# Patient Record
Sex: Female | Born: 1945 | Race: White | Hispanic: No | Marital: Married | Smoking: Former smoker
Health system: Southern US, Community
[De-identification: ages and names within clinical notes are randomized; demographics above are authoritative.]

## PROBLEM LIST (undated history)

## (undated) DIAGNOSIS — I10 Essential (primary) hypertension: Secondary | ICD-10-CM

## (undated) DIAGNOSIS — F419 Anxiety disorder, unspecified: Secondary | ICD-10-CM

## (undated) DIAGNOSIS — I639 Cerebral infarction, unspecified: Secondary | ICD-10-CM

## (undated) DIAGNOSIS — R413 Other amnesia: Secondary | ICD-10-CM

## (undated) DIAGNOSIS — K279 Peptic ulcer, site unspecified, unspecified as acute or chronic, without hemorrhage or perforation: Secondary | ICD-10-CM

## (undated) DIAGNOSIS — M1990 Unspecified osteoarthritis, unspecified site: Secondary | ICD-10-CM

## (undated) DIAGNOSIS — E785 Hyperlipidemia, unspecified: Secondary | ICD-10-CM

## (undated) DIAGNOSIS — K589 Irritable bowel syndrome without diarrhea: Secondary | ICD-10-CM

## (undated) DIAGNOSIS — I6523 Occlusion and stenosis of bilateral carotid arteries: Secondary | ICD-10-CM

## (undated) DIAGNOSIS — D649 Anemia, unspecified: Secondary | ICD-10-CM

## (undated) DIAGNOSIS — G459 Transient cerebral ischemic attack, unspecified: Secondary | ICD-10-CM

## (undated) DIAGNOSIS — E119 Type 2 diabetes mellitus without complications: Secondary | ICD-10-CM

## (undated) HISTORY — PX: OOPHORECTOMY: SHX86

## (undated) HISTORY — DX: Hyperlipidemia, unspecified: E78.5

## (undated) HISTORY — PX: ABDOMINAL HYSTERECTOMY: SHX81

## (undated) HISTORY — DX: Irritable bowel syndrome, unspecified: K58.9

## (undated) HISTORY — PX: COLON SURGERY: SHX602

## (undated) HISTORY — DX: Type 2 diabetes mellitus without complications: E11.9

## (undated) HISTORY — PX: TONSILLECTOMY: SHX5217

## (undated) HISTORY — DX: Peptic ulcer, site unspecified, unspecified as acute or chronic, without hemorrhage or perforation: K27.9

## (undated) HISTORY — PX: OTHER SURGICAL HISTORY: SHX169

---
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
## 2000-11-12 ENCOUNTER — Encounter: Payer: Self-pay | Admitting: Internal Medicine

## 2000-11-12 ENCOUNTER — Encounter: Admission: RE | Admit: 2000-11-12 | Discharge: 2000-11-12 | Payer: Self-pay | Admitting: Internal Medicine

## 2002-08-26 ENCOUNTER — Encounter: Payer: Self-pay | Admitting: Internal Medicine

## 2002-08-26 ENCOUNTER — Encounter: Admission: RE | Admit: 2002-08-26 | Discharge: 2002-08-26 | Payer: Self-pay | Admitting: Internal Medicine

## 2003-06-21 ENCOUNTER — Encounter: Admission: RE | Admit: 2003-06-21 | Discharge: 2003-06-21 | Payer: Self-pay | Admitting: Internal Medicine

## 2003-06-29 ENCOUNTER — Ambulatory Visit (HOSPITAL_COMMUNITY): Admission: RE | Admit: 2003-06-29 | Discharge: 2003-06-29 | Payer: Self-pay | Admitting: Internal Medicine

## 2004-01-29 ENCOUNTER — Emergency Department (HOSPITAL_COMMUNITY): Admission: EM | Admit: 2004-01-29 | Discharge: 2004-01-29 | Payer: Self-pay | Admitting: Emergency Medicine

## 2004-04-02 DIAGNOSIS — G459 Transient cerebral ischemic attack, unspecified: Secondary | ICD-10-CM

## 2004-04-02 HISTORY — DX: Transient cerebral ischemic attack, unspecified: G45.9

## 2004-04-12 ENCOUNTER — Ambulatory Visit: Payer: Self-pay | Admitting: Internal Medicine

## 2004-04-12 ENCOUNTER — Encounter: Admission: RE | Admit: 2004-04-12 | Discharge: 2004-04-12 | Payer: Self-pay | Admitting: Internal Medicine

## 2004-04-16 ENCOUNTER — Encounter: Admission: RE | Admit: 2004-04-16 | Discharge: 2004-04-16 | Payer: Self-pay | Admitting: Internal Medicine

## 2004-04-25 ENCOUNTER — Ambulatory Visit: Payer: Self-pay

## 2004-04-26 ENCOUNTER — Ambulatory Visit: Payer: Self-pay | Admitting: Internal Medicine

## 2004-04-28 ENCOUNTER — Encounter: Admission: RE | Admit: 2004-04-28 | Discharge: 2004-04-28 | Payer: Self-pay | Admitting: Internal Medicine

## 2004-05-29 ENCOUNTER — Ambulatory Visit: Payer: Self-pay | Admitting: Internal Medicine

## 2004-06-01 ENCOUNTER — Ambulatory Visit: Payer: Self-pay | Admitting: Internal Medicine

## 2004-07-04 ENCOUNTER — Ambulatory Visit: Payer: Self-pay | Admitting: Internal Medicine

## 2005-02-05 ENCOUNTER — Ambulatory Visit: Payer: Self-pay | Admitting: Internal Medicine

## 2005-05-15 ENCOUNTER — Ambulatory Visit: Payer: Self-pay

## 2005-06-14 ENCOUNTER — Ambulatory Visit: Payer: Self-pay | Admitting: Internal Medicine

## 2005-06-19 ENCOUNTER — Encounter: Admission: RE | Admit: 2005-06-19 | Discharge: 2005-06-19 | Payer: Self-pay | Admitting: Internal Medicine

## 2005-11-12 ENCOUNTER — Ambulatory Visit: Payer: Self-pay | Admitting: Internal Medicine

## 2005-12-20 ENCOUNTER — Ambulatory Visit: Payer: Self-pay | Admitting: Internal Medicine

## 2007-01-29 ENCOUNTER — Encounter: Payer: Self-pay | Admitting: Internal Medicine

## 2007-01-29 ENCOUNTER — Ambulatory Visit: Payer: Self-pay | Admitting: Internal Medicine

## 2007-01-29 LAB — CONVERTED CEMR LAB
ALT: 29 units/L (ref 0–35)
AST: 26 units/L (ref 0–37)
BUN: 21 mg/dL (ref 6–23)
CO2: 32 meq/L (ref 19–32)
Calcium: 9.2 mg/dL (ref 8.4–10.5)
Chloride: 102 meq/L (ref 96–112)
Creatinine, Ser: 0.9 mg/dL (ref 0.4–1.2)
HDL: 47 mg/dL (ref >39.0)
Hgb A1c MFr Bld: 7.2 % — ABNORMAL HIGH (ref 4.6–6.0)
Potassium: 4.2 meq/L (ref 3.5–5.1)
VLDL: 52 mg/dL — ABNORMAL HIGH (ref 0–40)

## 2007-01-30 DIAGNOSIS — Z8711 Personal history of peptic ulcer disease: Secondary | ICD-10-CM | POA: Insufficient documentation

## 2007-01-30 DIAGNOSIS — I10 Essential (primary) hypertension: Secondary | ICD-10-CM

## 2007-01-30 DIAGNOSIS — L0292 Furuncle, unspecified: Secondary | ICD-10-CM | POA: Insufficient documentation

## 2007-01-30 DIAGNOSIS — E118 Type 2 diabetes mellitus with unspecified complications: Secondary | ICD-10-CM

## 2007-01-30 DIAGNOSIS — E785 Hyperlipidemia, unspecified: Secondary | ICD-10-CM | POA: Insufficient documentation

## 2007-01-30 DIAGNOSIS — E119 Type 2 diabetes mellitus without complications: Secondary | ICD-10-CM | POA: Insufficient documentation

## 2007-01-30 DIAGNOSIS — L0293 Carbuncle, unspecified: Secondary | ICD-10-CM

## 2007-01-30 DIAGNOSIS — Z8719 Personal history of other diseases of the digestive system: Secondary | ICD-10-CM | POA: Insufficient documentation

## 2007-03-07 ENCOUNTER — Ambulatory Visit: Payer: Self-pay | Admitting: Internal Medicine

## 2007-03-07 DIAGNOSIS — A0472 Enterocolitis due to Clostridium difficile, not specified as recurrent: Secondary | ICD-10-CM | POA: Insufficient documentation

## 2007-03-10 ENCOUNTER — Encounter: Payer: Self-pay | Admitting: Internal Medicine

## 2007-03-11 ENCOUNTER — Telehealth: Payer: Self-pay | Admitting: Internal Medicine

## 2007-05-07 ENCOUNTER — Telehealth (INDEPENDENT_AMBULATORY_CARE_PROVIDER_SITE_OTHER): Payer: Self-pay | Admitting: *Deleted

## 2007-05-08 ENCOUNTER — Ambulatory Visit: Payer: Self-pay | Admitting: Internal Medicine

## 2007-05-08 DIAGNOSIS — M79604 Pain in right leg: Secondary | ICD-10-CM

## 2007-09-30 ENCOUNTER — Encounter: Admission: RE | Admit: 2007-09-30 | Discharge: 2007-09-30 | Payer: Self-pay | Admitting: Internal Medicine

## 2008-03-08 ENCOUNTER — Ambulatory Visit: Payer: Self-pay | Admitting: Internal Medicine

## 2008-03-08 DIAGNOSIS — I6350 Cerebral infarction due to unspecified occlusion or stenosis of unspecified cerebral artery: Secondary | ICD-10-CM | POA: Insufficient documentation

## 2008-03-08 DIAGNOSIS — E663 Overweight: Secondary | ICD-10-CM | POA: Insufficient documentation

## 2008-03-18 ENCOUNTER — Encounter: Payer: Self-pay | Admitting: Internal Medicine

## 2008-03-25 ENCOUNTER — Ambulatory Visit: Payer: Self-pay | Admitting: Internal Medicine

## 2008-03-25 LAB — CONVERTED CEMR LAB
Albumin: 3.8 g/dL (ref 3.5–5.2)
BUN: 23 mg/dL (ref 6–23)
Calcium: 9.7 mg/dL (ref 8.4–10.5)
Cholesterol: 207 mg/dL (ref 0–200)
Direct LDL: 135.9 mg/dL
Eosinophils Absolute: 0.2 10*3/uL (ref 0.0–0.7)
Eosinophils Relative: 2.3 % (ref 0.0–5.0)
GFR calc Af Amer: 65 mL/min
GFR calc non Af Amer: 54 mL/min
Glucose, Bld: 226 mg/dL — ABNORMAL HIGH (ref 70–99)
HCT: 36.1 % (ref 36.0–46.0)
Hemoglobin: 12.5 g/dL (ref 12.0–15.0)
MCV: 94.4 fL (ref 78.0–100.0)
Monocytes Absolute: 0.6 10*3/uL (ref 0.1–1.0)
Monocytes Relative: 7.2 % (ref 3.0–12.0)
Neutro Abs: 4.5 10*3/uL (ref 1.4–7.7)
RDW: 12.1 % (ref 11.5–14.6)
Total CHOL/HDL Ratio: 4.2
Total Protein: 6 g/dL (ref 6.0–8.3)
Triglycerides: 198 mg/dL — ABNORMAL HIGH (ref 0–149)

## 2008-04-27 ENCOUNTER — Ambulatory Visit: Payer: Self-pay | Admitting: Internal Medicine

## 2008-04-29 ENCOUNTER — Telehealth: Payer: Self-pay | Admitting: Internal Medicine

## 2008-07-14 ENCOUNTER — Ambulatory Visit: Payer: Self-pay | Admitting: Internal Medicine

## 2008-07-14 LAB — CONVERTED CEMR LAB
Cholesterol: 202 mg/dL — ABNORMAL HIGH (ref 0–200)
HDL: 48.6 mg/dL (ref >39.00)
Triglycerides: 238 mg/dL — ABNORMAL HIGH (ref 0.0–149.0)

## 2008-07-25 ENCOUNTER — Encounter: Payer: Self-pay | Admitting: Internal Medicine

## 2008-12-09 ENCOUNTER — Telehealth: Payer: Self-pay | Admitting: Internal Medicine

## 2008-12-10 ENCOUNTER — Telehealth: Payer: Self-pay | Admitting: Family Medicine

## 2008-12-10 ENCOUNTER — Ambulatory Visit: Payer: Self-pay | Admitting: Internal Medicine

## 2008-12-10 ENCOUNTER — Encounter: Admission: RE | Admit: 2008-12-10 | Discharge: 2008-12-10 | Payer: Self-pay | Admitting: Internal Medicine

## 2008-12-10 DIAGNOSIS — M549 Dorsalgia, unspecified: Secondary | ICD-10-CM | POA: Insufficient documentation

## 2008-12-13 ENCOUNTER — Telehealth: Payer: Self-pay | Admitting: Internal Medicine

## 2008-12-16 ENCOUNTER — Encounter: Payer: Self-pay | Admitting: Internal Medicine

## 2009-02-08 ENCOUNTER — Ambulatory Visit: Payer: Self-pay | Admitting: Internal Medicine

## 2009-02-08 DIAGNOSIS — R209 Unspecified disturbances of skin sensation: Secondary | ICD-10-CM | POA: Insufficient documentation

## 2009-02-08 LAB — CONVERTED CEMR LAB
Folate: >20 ng/mL
Hgb A1c MFr Bld: 6.9 % — ABNORMAL HIGH (ref 4.6–6.5)
TSH: 1.76 microintl units/mL (ref 0.35–5.50)

## 2009-02-25 ENCOUNTER — Encounter: Admission: RE | Admit: 2009-02-25 | Discharge: 2009-02-25 | Payer: Self-pay | Admitting: Internal Medicine

## 2009-02-25 LAB — HM MAMMOGRAPHY

## 2009-03-04 ENCOUNTER — Telehealth: Payer: Self-pay | Admitting: Internal Medicine

## 2009-09-15 ENCOUNTER — Ambulatory Visit: Payer: Self-pay | Admitting: Internal Medicine

## 2009-09-15 DIAGNOSIS — L538 Other specified erythematous conditions: Secondary | ICD-10-CM

## 2009-09-15 DIAGNOSIS — N952 Postmenopausal atrophic vaginitis: Secondary | ICD-10-CM

## 2009-09-15 DIAGNOSIS — M25559 Pain in unspecified hip: Secondary | ICD-10-CM

## 2009-09-15 LAB — CONVERTED CEMR LAB
ALT: 24 units/L (ref 0–35)
Albumin: 4.3 g/dL (ref 3.5–5.2)
BUN: 17 mg/dL (ref 6–23)
Bilirubin, Direct: 0.1 mg/dL (ref 0.0–0.3)
Cholesterol: 226 mg/dL — ABNORMAL HIGH (ref 0–200)
GFR calc non Af Amer: 70.95 mL/min (ref >60)
HDL: 57.6 mg/dL (ref >39.00)
Hgb A1c MFr Bld: 6.8 % — ABNORMAL HIGH (ref 4.6–6.5)
Potassium: 4.6 meq/L (ref 3.5–5.1)
Sodium: 146 meq/L — ABNORMAL HIGH (ref 135–145)
Total Protein: 6.8 g/dL (ref 6.0–8.3)
Triglycerides: 342 mg/dL — ABNORMAL HIGH (ref 0.0–149.0)

## 2009-09-16 ENCOUNTER — Encounter: Payer: Self-pay | Admitting: Internal Medicine

## 2009-10-20 ENCOUNTER — Telehealth: Payer: Self-pay | Admitting: Internal Medicine

## 2009-12-07 ENCOUNTER — Telehealth (INDEPENDENT_AMBULATORY_CARE_PROVIDER_SITE_OTHER): Payer: Self-pay | Admitting: *Deleted

## 2009-12-08 ENCOUNTER — Ambulatory Visit: Payer: Self-pay | Admitting: Internal Medicine

## 2009-12-08 DIAGNOSIS — R002 Palpitations: Secondary | ICD-10-CM

## 2009-12-13 ENCOUNTER — Telehealth (INDEPENDENT_AMBULATORY_CARE_PROVIDER_SITE_OTHER): Payer: Self-pay | Admitting: *Deleted

## 2009-12-22 ENCOUNTER — Ambulatory Visit: Payer: Self-pay

## 2009-12-27 ENCOUNTER — Encounter: Payer: Self-pay | Admitting: Internal Medicine

## 2009-12-27 ENCOUNTER — Encounter (INDEPENDENT_AMBULATORY_CARE_PROVIDER_SITE_OTHER): Payer: Self-pay | Admitting: *Deleted

## 2009-12-30 ENCOUNTER — Telehealth (INDEPENDENT_AMBULATORY_CARE_PROVIDER_SITE_OTHER): Payer: Self-pay | Admitting: *Deleted

## 2010-01-13 ENCOUNTER — Ambulatory Visit: Payer: Self-pay | Admitting: Internal Medicine

## 2010-01-30 ENCOUNTER — Encounter: Payer: Self-pay | Admitting: Internal Medicine

## 2010-02-11 ENCOUNTER — Encounter: Payer: Self-pay | Admitting: Internal Medicine

## 2010-04-06 ENCOUNTER — Ambulatory Visit
Admission: RE | Admit: 2010-04-06 | Discharge: 2010-04-06 | Payer: Self-pay | Source: Home / Self Care | Attending: Internal Medicine | Admitting: Internal Medicine

## 2010-04-06 ENCOUNTER — Encounter: Payer: Self-pay | Admitting: Internal Medicine

## 2010-04-06 DIAGNOSIS — B351 Tinea unguium: Secondary | ICD-10-CM | POA: Insufficient documentation

## 2010-04-23 ENCOUNTER — Encounter: Payer: Self-pay | Admitting: Internal Medicine

## 2010-05-02 NOTE — Progress Notes (Signed)
  Phone Note Refill Request Message from:  Fax from Pharmacy on October 20, 2009 8:20 AM  Refills Requested: Medication #1:  ULTRAM 50 MG TABS 1-2  by mouth every 6 h as needed Please Advise refill.  Initial call taken by: Ami Bullins CMA,  October 20, 2009 8:21 AM  Follow-up for Phone Call        OK for refill as needed  Follow-up by: Jacques Navy MD,  October 20, 2009 8:52 AM    Prescriptions: Tiffany Wiggins 50 MG TABS (TRAMADOL HCL) 1-2  by mouth every 6 h as needed  #60 x 1   Entered by:   Ami Bullins CMA   Authorized by:   Jacques Navy MD   Signed by:   Bill Salinas CMA on 10/20/2009   Method used:   Electronically to        [facility].* (retail)       [address]       [place], [place]  [address]       Ph: [phone]       Fax: [phone]   RxID:   [id number]

## 2010-05-02 NOTE — Progress Notes (Signed)
Summary: DISCUSS PALPITATION OV--  ---- Converted from flag ---- ---- 12/06/2009 6:18 PM, Jacques Navy MD wrote: patient needs an ov to discuss palpitations ------------------------------ Gave pt appt/phone:  12/08/09 @ 250P w/Dr Debby Bud
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
## 2010-05-02 NOTE — Progress Notes (Signed)
Summary: event monitor 01/13/10  Phone Note Outgoing Call Call back at [facility] Phone [phone]   Call placed by: Stanton Kidney, EMT-P,  December 30, 2009 2:02 PM Call placed to: Patient Action Taken: Phone Call Completed Summary of Call: S/W Patient, scheduled for event monitor for [facility] 01/13/10 2:00

## 2010-05-02 NOTE — Procedures (Signed)
Summary: Summary Report  Summary Report   Imported By: Erle Crocker 02/16/2010 13:56:47  _____________________________________________________________________  External Attachment:    Type:   Image     Comment:   External Document

## 2010-05-02 NOTE — Miscellaneous (Signed)
Summary: Orders Update  Clinical Lists Changes  Orders: Added new Referral order of Cardiology Referral (Cardiology) - Signed 
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
## 2010-05-02 NOTE — Procedures (Signed)
Summary: [facility]  [employer]   Imported By: Sherian Rein 02/21/2010 07:37:33  _____________________________________________________________________  External Attachment:    Type:   Image     Comment:   External Document

## 2010-05-02 NOTE — Progress Notes (Signed)
Summary: holter monitior  Phone Note Outgoing Call Call back at Home Phone [phone]   Call placed by: Marcos Eke,  December 13, 2009 4:51 PM Action Taken: Appt scheduled Summary of Call: Pt has appt. for holter monitor on 12/22/09 at 9am

## 2010-05-02 NOTE — Procedures (Signed)
Summary: summary report  summary report   Imported By: Mirna Mires 12/27/2009 16:15:43  _____________________________________________________________________  External Attachment:    Type:   Image     Comment:   External Document

## 2010-05-02 NOTE — Letter (Signed)
[facility] [address] [place], [place]  [address] Phone: [phone]      September 16, 2009   [facility] [address] [place], [place] [address]  RE:  LAB RESULTS  Dear  [name],  The following is an interpretation of your most recent lab tests.  Please take note of any instructions provided or changes to medications that have resulted from your lab work.  ELECTROLYTES:  Good - no changes needed  KIDNEY FUNCTION TESTS:  Good - no changes needed  LIVER FUNCTION TESTS:  Good - no changes needed  Health professionals look at cholesterol as more involved than just the total cholesterol. We consider the level of LDL (bad) cholesterol, HDL (good), cholesterol, and Triglycerides (Grease) in the blood.  1. Your LDL should be under 100, and the HDL should be over 45, if you have any vascular disease such as heart attack, angina, stroke, TIA (mini stroke), claudication (pain in the legs when you walk due to poor circulation),  Abdominal Aortic Aneurysm (AAA), diabetes or prediabetes.  2. Your LDL should be under 130 if you have any two of the following:     a. Smoke or chew tobacco,     b. High blood pressure (if you are on medication or over 140/90 without medication),     c. Female gender,    d. HDL below 40,    e. A female relative (father, brother, or son), who have had any vascular event          as described in #1. above under the age of 71, or a female relative (mother,       sister, or daughter) who had an event as described above under age 96. (An HDL over 60 will subtract one risk factor from the total, so if you have two items in # 2 above, but an HDL over 60, you then fall into category # 3 below).  3. Your LDL should be under 160 if you have any one of the above.  Triglycerides should be under 200 with the ideal being under 150.  For diabetes or pre-diabetes, the ideal HgbA1C should be under 6.0%.  If you fall into any of the above categories, you  should make a follow up appointment to discuss this with your physician.  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 water exercise would be great.  Call or e-mail me if you have questions ([email or web address]).    Sincerely Yours,    Jacques Navy MD  Patient: Tiffany Wiggins Note: All result statuses are Final unless otherwise noted.  Tests: (1) BMP (METABOL)   Sodium               [H]  146 mEq/L                   135-145   Potassium                 4.6 mEq/L  3.5-5.1   Chloride                  108 mEq/L                   96-112   Carbon Dioxide            30 mEq/L                    19-32   Glucose              [H]  139 mg/dL                   16-10   BUN                       17 mg/dL                    9-60   Creatinine                0.9 mg/dL                   4.5-4.0   Calcium                   10.0 mg/dL                  9.8-11.9   GFR                       70.95 mL/min                >60  Tests: (2) Hemoglobin A1C (A1C)   Hemoglobin A1C       [H]  6.8 %                       4.6-6.5     Glycemic Control Guidelines for People with Diabetes:     Non Diabetic:  <6%     Goal of Therapy: <7%     Additional Action Suggested:  >8%   Tests: (3) Lipid Panel (LIPID)   Cholesterol          [H]  226 mg/dL                   1-478     ATP III Classification            Desirable:  < 200 mg/dL                    Borderline High:  200 - 239 mg/dL               High:  > = 240 mg/dL   Triglycerides        [H]  342.0 mg/dL                 2.9-562.1      Normal:  <150 mg/dL     Borderline High:  308 - 199 mg/dL   HDL                       65.78 mg/dL                 >46.96   VLDL Cholesterol     [H]  68.4 mg/dL  0.0-40.0  CHO/HDL Ratio:  CHD Risk                             4                    Men          Women     1/2 Average Risk     3.4          3.3     Average Risk          5.0          4.4     2X Average Risk          9.6          7.1     3X Average Risk          15.0          11.0                           Tests: (4) Hepatic/Liver Function Panel (HEPATIC)   Total Bilirubin           0.9 mg/dL                   1.6-1.0   Direct Bilirubin          0.1 mg/dL                   9.6-0.4   Alkaline Phosphatase      71 U/L                      39-117   AST                       24 U/L                      0-37   ALT                       24 U/L                      0-35   Total Protein             6.8 g/dL                    5.4-0.9   Albumin                   4.3 g/dL                    8.1-1.9  Tests: (5) Cholesterol LDL - Direct (DIRLDL)  Cholesterol LDL - Direct                             125.9 mg/dL     Optimal:  <147 mg/dL     Near or Above Optimal:  100-129 mg/dL     Borderline High:  829-562 mg/dL     High:  130-865 mg/dL     Very High:  >784 mg/dL  G HIP BILATERAL W/PELVIS - [id number]   Clinical Data: Bilateral hip pain.  Worsening pain with weightbearing.   BILATERAL HIP WITH PELVIS - 4+ VIEW   Comparison:  None.   Findings: Degenerative osteophytic formation bilaterally.  This involves the acetabuli and femoral heads.  No joint space narrowing.  Normal alignment.   IMPRESSION: Degenerative OA bilaterally.   Read By:  Jonne Ply,  Judie Petit.D.
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
## 2010-08-02 ENCOUNTER — Telehealth: Payer: Self-pay | Admitting: *Deleted

## 2010-08-02 DIAGNOSIS — M549 Dorsalgia, unspecified: Secondary | ICD-10-CM

## 2010-08-02 MED ORDER — PREDNISONE 10 MG PO TABS: 10.0000 mg | ORAL_TABLET | Freq: Every day | ORAL | Status: DC

## 2010-08-02 MED ORDER — OXYCODONE-ACETAMINOPHEN 7.5-500 MG PO TABS: 1.0000 | ORAL_TABLET | Freq: Four times a day (QID) | ORAL | Status: DC | PRN

## 2010-08-02 NOTE — Telephone Encounter (Signed)
Pt is having trouble with a herniated disk, she is bed bound this morning and has taken 2 50mg  tramadol with little relief. I gave pts husband the option of coming in at 11:30 today. But he states the pt cannot get out of the bed. Pt is coming in for an office visit tomorrow but would like advisement as to what she can do to control her pain until she can get here for an office visit. Please Advise

## 2010-08-02 NOTE — Telephone Encounter (Signed)
Called pts husband fred and informed him of Dr Debby Bud advisement. Pts husband will come pick up percocet prescription.

## 2010-08-02 NOTE — Telephone Encounter (Signed)
For severe  Back pain that may be disk related: prednisone 10mg : 40mg  x 1, then 30mg  x 3, 20mg  x 3 10 mg x6; percocet 7.5/500 q 6 # 40. She will need L-S spine films prior to being seen tomorrow. Order entered.

## 2010-08-03 ENCOUNTER — Ambulatory Visit (INDEPENDENT_AMBULATORY_CARE_PROVIDER_SITE_OTHER)
Admission: RE | Admit: 2010-08-03 | Discharge: 2010-08-03 | Disposition: A | Discharge status: Home / Self Care | Payer: Self-pay | Source: Ambulatory Visit | Attending: Internal Medicine | Admitting: Internal Medicine

## 2010-08-03 ENCOUNTER — Ambulatory Visit (INDEPENDENT_AMBULATORY_CARE_PROVIDER_SITE_OTHER): Payer: 59 | Admitting: Internal Medicine

## 2010-08-03 VITALS — BP 136/80 | HR 65 | Temp 97.2°F

## 2010-08-03 DIAGNOSIS — M549 Dorsalgia, unspecified: Secondary | ICD-10-CM

## 2010-08-03 DIAGNOSIS — M545 Low back pain: Secondary | ICD-10-CM

## 2010-08-03 MED ORDER — CYCLOBENZAPRINE HCL 5 MG PO TABS: 5.0000 mg | ORAL_TABLET | Freq: Three times a day (TID) | ORAL | Status: AC | PRN

## 2010-08-03 NOTE — Patient Instructions (Signed)
Low back pain right - exam does not reveal any radicular symptoms, i.e. Herniated disk with nerve root compression. Most likely the pain is muscle strain with muscle spasm. Plan - continue prednisone burst and taper, continue with tramadol, start flexeril 5 mg 3 times a day, use the narcotic is needed. Use your arms to move from sitting to laying down and vice versa. If you get increased weakness, loss of feeling or pins and needles, change in bladder or bowel control, increased and severe pain - CALL.  Back Pain (Lumbosacral Strain) Back pain is one of the most common causes of pain. There are many causes of back pain. Most are not serious conditions.  CAUSES Your backbone (spinal column) is made up of 24 main vertebral bodies, the sacrum, and the coccyx. These are held together by muscles and tough, fibrous tissue (ligaments). Nerve roots pass through the openings between the vertebrae. A sudden move or injury to the back may cause injury to, or pressure on, these nerves. This may result in localized back pain or pain movement (radiation) into the buttocks, down the leg, and into the foot. Sharp, shooting pain from the buttock down the back of the leg (sciatica) is frequently associated with a ruptured (herniated) disc. Pain may be caused by muscle spasm alone. Your caregiver can often find the cause of your pain by the details of your symptoms and an exam. In some cases, you may need tests (such as X-rays). Your caregiver will work with you to decide if any tests are needed based on your specific exam. HOME CARE INSTRUCTIONS  Avoid an underactive lifestyle. Active exercise, as directed by your caregiver, is your greatest weapon against back pain.   Avoid hard physical activities (tennis, racquetball, water-skiing) if you are not in proper physical condition for it. This may aggravate and/or create problems.   If you have a back problem, avoid sports requiring sudden body movements. Swimming and  walking are generally safer activities.   Maintain good posture.   Avoid becoming overweight (obese).   Use bed rest for only the most extreme, sudden (acute) episode. Your caregiver will help you determine how much bed rest is necessary.   For acute conditions, you may put ice on the injured area.   Put ice in a plastic bag.   Place a towel between your skin and the bag.   Leave the ice on for 10 minutes at a time, every 2 hours, or as needed.   After you are improved and more active, it may help to apply heat for 30 minutes before activities.  See your caregiver if you are having pain that lasts longer than expected. Your caregiver can advise appropriate exercises and/or therapy if needed. With conditioning, most back problems can be avoided. SEEK IMMEDIATE MEDICAL CARE IF:  You have numbness, tingling, weakness, or problems with the use of your arms or legs.   You experience severe back pain not relieved with medicines.   There is a change in bowel or bladder control.   You have increasing pain in any area of the body, including your belly (abdomen).   You notice shortness of breath, dizziness, or feel faint.   You feel sick to your stomach (nauseous), are throwing up (vomiting), or become sweaty.   You notice discoloration of your toes or legs, or your feet get very cold.   Your back pain is getting worse.   You have an oral temperature above 100, not controlled by medicine.  MAKE SURE YOU:    Understand these instructions.   Will watch your condition.   Will get help right away if you are not doing well or get worse.  Document Released: 12/27/2004 Document Re-Released: 06/13/2009 [facility] Patient Information 2011 [place], [place].

## 2010-08-06 NOTE — Progress Notes (Signed)
  Subjective:    Patient ID: Tiffany Wiggins, female    DOB: 1945/06/22, 65 y.o.   MRN: [id number]  HPI [name], a 65 y/o white woman presents with a 3 week h/o low back pain that began after working in the garden pulling weeds. The pain is similar to what she experienced in the past with a HNP. She has tried bedrest, ibuprofen, tramadol and icing her back all of which have helped relieve her pain. Due to prolonged discomfort she called the office May 2 and was prescribed prednisone burst and taper along with percocet. She started the steroids but not the narcotics. She denies any loss of bowel or bladder control, no isolated motor weakness, no loss of sensation.   PMH, FamHx and SocHx reviewed for any changes and relevance.    Review of Systems Review of Systems  Constitutional:  Negative for fever, chills, activity change and unexpected weight change.  HENT:  Negative for hearing loss, ear pain, congestion, neck stiffness and postnasal drip.   Eyes: Negative for pain, discharge and visual disturbance.  Respiratory: Negative for chest tightness and wheezing.   Cardiovascular: Negative for chest pain and palpitations.       [No decreased exercise tolerance Gastrointestinal: [No change in bowel habit. No bloating or gas. No reflux or indigestion Genitourinary: Negative for urgency, frequency, flank pain and difficulty urinating.  Musculoskeletal: per the HPI  Neurological: Negative for dizziness, tremors, weakness and headaches.  Hematological: Negative for adenopathy.  Psychiatric/Behavioral: Negative for behavioral problems and dysphoric mood.       Objective:   Physical Exam WNWD white woman in no distress HEENT- C&S clear Chest - no increased WOB Cor- RRR Back exam: able to stand without assist, flex to 60 degrees, gait - slow but no foot drop, able to heel/toe walk, able to step up to exam table, nl SLR sitting and supine, nl DTRs patellar tendon, nl sensation to light touch  and pin-prick, nl deep vibratory sensation.         Assessment & Plan:  1. Low back pain - no radiculopathy on exam. Suspect muscle strain and not nerve root impingement.  Plan - continue prednisone taper           Flexeril 5mg  tid for muscle spasm           Continue tramadol and may use percocet if needed.           Resume home exercises for her back when pain subsides           Call for increased, refractory pain, paresthesia, motor weakness, incontinence.

## 2010-08-08 ENCOUNTER — Other Ambulatory Visit: Payer: Self-pay | Admitting: Internal Medicine

## 2010-08-08 DIAGNOSIS — M549 Dorsalgia, unspecified: Secondary | ICD-10-CM

## 2010-08-09 ENCOUNTER — Ambulatory Visit (HOSPITAL_COMMUNITY)
Admission: RE | Admit: 2010-08-09 | Discharge: 2010-08-09 | Disposition: A | Discharge status: Home / Self Care | Payer: 59 | Source: Ambulatory Visit | Attending: Internal Medicine | Admitting: Internal Medicine

## 2010-08-09 ENCOUNTER — Other Ambulatory Visit: Payer: Self-pay | Admitting: *Deleted

## 2010-08-09 DIAGNOSIS — M48061 Spinal stenosis, lumbar region without neurogenic claudication: Secondary | ICD-10-CM | POA: Insufficient documentation

## 2010-08-09 DIAGNOSIS — M545 Low back pain, unspecified: Secondary | ICD-10-CM | POA: Insufficient documentation

## 2010-08-09 DIAGNOSIS — M549 Dorsalgia, unspecified: Secondary | ICD-10-CM

## 2010-08-09 DIAGNOSIS — M5126 Other intervertebral disc displacement, lumbar region: Secondary | ICD-10-CM | POA: Insufficient documentation

## 2010-08-09 MED ORDER — COLESEVELAM HCL 625 MG PO TABS: 1875.0000 mg | ORAL_TABLET | Freq: Two times a day (BID) | ORAL | Status: DC

## 2010-08-11 ENCOUNTER — Telehealth: Payer: Self-pay | Admitting: Internal Medicine

## 2010-08-11 DIAGNOSIS — M5416 Radiculopathy, lumbar region: Secondary | ICD-10-CM

## 2010-08-11 NOTE — Telephone Encounter (Signed)
Patient stated she does not have a neurosurgeon and asks to be referred to one. Please advise.

## 2010-08-11 NOTE — Telephone Encounter (Signed)
Called patient - will refer to [facility] for [facility] Order entered. Please follow-up with PCCs Monday to get this moving. Thanks

## 2010-08-11 NOTE — Telephone Encounter (Signed)
Please advise on referral and things that patient can do or take until that appointment is scheduled. Thanks

## 2010-08-11 NOTE — Telephone Encounter (Signed)
Patient's husband called inquiring about the results of her MRI done on 08/09/10. Patient's husband states she is in severe pain and requests a call back with results and further advise.

## 2010-08-11 NOTE — Telephone Encounter (Signed)
Note printed and given to [facility]

## 2010-08-11 NOTE — Telephone Encounter (Signed)
Primary change is increased disk bulge at L3-4 with L3 nerve root encroachement. Steroids have failed. Need to call her neurosurgeon for acute appontment.

## 2010-08-15 ENCOUNTER — Other Ambulatory Visit: Payer: Self-pay | Admitting: Internal Medicine

## 2010-08-15 DIAGNOSIS — M549 Dorsalgia, unspecified: Secondary | ICD-10-CM

## 2010-08-21 ENCOUNTER — Ambulatory Visit
Admission: RE | Admit: 2010-08-21 | Discharge: 2010-08-21 | Disposition: A | Discharge status: Home / Self Care | Payer: 59 | Source: Ambulatory Visit | Attending: Internal Medicine | Admitting: Internal Medicine

## 2010-08-21 DIAGNOSIS — M549 Dorsalgia, unspecified: Secondary | ICD-10-CM

## 2010-08-23 ENCOUNTER — Telehealth: Payer: Self-pay | Admitting: Internal Medicine

## 2010-08-23 NOTE — Telephone Encounter (Signed)
Please call patient: Did the epidural steroid injection help??

## 2010-08-24 NOTE — Telephone Encounter (Signed)
Pt states injection helped tremendously. She states spasms are gone and she hasn't had any since the injection. Pt states Yesterday morning when she got up she had urinary incontinence. She states symptoms started two days ago. Could this be from injection?

## 2010-09-01 ENCOUNTER — Encounter: Payer: Self-pay | Admitting: Internal Medicine

## 2010-09-04 ENCOUNTER — Encounter: Payer: Self-pay | Admitting: Internal Medicine

## 2010-09-04 ENCOUNTER — Ambulatory Visit (INDEPENDENT_AMBULATORY_CARE_PROVIDER_SITE_OTHER): Payer: 59 | Admitting: Internal Medicine

## 2010-09-04 DIAGNOSIS — M79609 Pain in unspecified limb: Secondary | ICD-10-CM

## 2010-09-04 NOTE — Progress Notes (Signed)
  Subjective:    Patient ID: Tiffany Wiggins, female    DOB: 05/26/1945, 65 y.o.   MRN: [id number]  HPI Tiffany Wiggins is seen in follow-up. She had severe back pain with MRI that revealed disk disease with nerve root impingement. She did have ESI with excellent results. She is still having a very little soreness in the low back and asks about using Ibuprofen and heat - OK. Did discuss smart lifting and work.   PMH, FamHx and SocHx reviewed for any changes and relevance.  Review of Systems Review of Systems  Constitutional:  Negative for fever, chills, activity change and unexpected weight change.  HENT:  Negative for hearing loss, ear pain, congestion, neck stiffness and postnasal drip.   Eyes: Negative for pain, discharge and visual disturbance.  Respiratory: Negative for chest tightness and wheezing.   Cardiovascular: Negative for chest pain and palpitations.       [No decreased exercise tolerance Gastrointestinal: [No change in bowel habit. No bloating or gas. No reflux or indigestion Genitourinary: Negative for urgency, frequency, flank pain and difficulty urinating.  Musculoskeletal: Negative for myalgias, arthralgias and gait problem. Has minimal resiudal back pain. Neurological: Negative for dizziness, tremors, weakness and headaches.  Hematological: Negative for adenopathy.  Psychiatric/Behavioral: Negative for behavioral problems and dysphoric mood.       Objective:   Physical Exam Vitals noted Gen'l-WNWD white woman who is in no distress Pul- no increased WOB Cor- RRR MSK- nl gait and station, able to stand without assistance.        Assessment & Plan:

## 2010-09-07 NOTE — Assessment & Plan Note (Signed)
Recent bout of back pain with radiation to the leg due to bulging disk by MRI. Has done very well with ESI.  Plan - resume normal activity            Good back care with regular stretching and smart lifting.           She is aware of potential for recurrent problems and will call for any recurrent pain.

## 2010-10-06 ENCOUNTER — Other Ambulatory Visit: Payer: Self-pay | Admitting: Internal Medicine

## 2010-11-07 ENCOUNTER — Telehealth: Payer: Self-pay | Admitting: *Deleted

## 2010-11-07 DIAGNOSIS — M79609 Pain in unspecified limb: Secondary | ICD-10-CM

## 2010-11-07 NOTE — Telephone Encounter (Signed)
Husband called back - Patient prefers OV with Dr Debby Bud, she will keep OV this Thursday.

## 2010-11-07 NOTE — Telephone Encounter (Signed)
Husband left vm - there was some e-mail correspondence? Option was another OV w/Dr Norins or another injection? Pt would like injection only set up for 8/13. Is this ESI? Pt does have OV scheduled with you on 8/9 - should she keep this, please help clarify, THANKS

## 2010-11-07 NOTE — Telephone Encounter (Signed)
Will need referral- [facility] notified. Keep appt

## 2010-11-09 ENCOUNTER — Ambulatory Visit: Payer: 59 | Admitting: Internal Medicine

## 2010-11-13 ENCOUNTER — Other Ambulatory Visit: Payer: Self-pay | Admitting: Internal Medicine

## 2010-11-13 DIAGNOSIS — M5416 Radiculopathy, lumbar region: Secondary | ICD-10-CM

## 2010-11-17 ENCOUNTER — Ambulatory Visit
Admission: RE | Admit: 2010-11-17 | Discharge: 2010-11-17 | Disposition: A | Discharge status: Home / Self Care | Payer: 59 | Source: Ambulatory Visit | Attending: Internal Medicine | Admitting: Internal Medicine

## 2010-11-17 DIAGNOSIS — M5416 Radiculopathy, lumbar region: Secondary | ICD-10-CM

## 2010-11-17 MED ORDER — IOHEXOL 180 MG/ML  SOLN
1.0000 mL | Freq: Once | INTRAMUSCULAR | Status: AC | PRN
  Administered 2010-11-17: 1 mL via EPIDURAL

## 2010-11-17 MED ORDER — METHYLPREDNISOLONE ACETATE 40 MG/ML INJ SUSP (RADIOLOG
120.0000 mg | Freq: Once | INTRAMUSCULAR | Status: AC
  Administered 2010-11-17: 120 mg via EPIDURAL

## 2010-12-12 ENCOUNTER — Other Ambulatory Visit: Payer: Self-pay | Admitting: Internal Medicine

## 2010-12-12 DIAGNOSIS — M549 Dorsalgia, unspecified: Secondary | ICD-10-CM

## 2010-12-12 DIAGNOSIS — R209 Unspecified disturbances of skin sensation: Secondary | ICD-10-CM

## 2011-01-09 ENCOUNTER — Other Ambulatory Visit: Payer: Self-pay | Admitting: Internal Medicine

## 2011-01-17 ENCOUNTER — Other Ambulatory Visit: Payer: Self-pay | Admitting: Internal Medicine

## 2011-01-17 DIAGNOSIS — Z1231 Encounter for screening mammogram for malignant neoplasm of breast: Secondary | ICD-10-CM

## 2011-01-23 ENCOUNTER — Ambulatory Visit
Admission: RE | Admit: 2011-01-23 | Discharge: 2011-01-23 | Disposition: A | Discharge status: Home / Self Care | Payer: 59 | Source: Ambulatory Visit | Attending: Internal Medicine | Admitting: Internal Medicine

## 2011-01-23 DIAGNOSIS — Z1231 Encounter for screening mammogram for malignant neoplasm of breast: Secondary | ICD-10-CM

## 2011-02-09 ENCOUNTER — Other Ambulatory Visit: Payer: Self-pay | Admitting: Internal Medicine

## 2011-03-02 ENCOUNTER — Other Ambulatory Visit: Payer: Self-pay | Admitting: Internal Medicine

## 2011-04-13 ENCOUNTER — Other Ambulatory Visit: Payer: Self-pay | Admitting: Internal Medicine

## 2011-04-19 ENCOUNTER — Encounter (HOSPITAL_COMMUNITY): Payer: Self-pay | Admitting: Pharmacy Technician

## 2011-04-19 ENCOUNTER — Other Ambulatory Visit: Payer: Self-pay | Admitting: Neurosurgery

## 2011-04-23 ENCOUNTER — Encounter (HOSPITAL_COMMUNITY)
Admission: RE | Admit: 2011-04-23 | Discharge: 2011-04-23 | Disposition: A | Discharge status: Home / Self Care | Payer: 59 | Source: Ambulatory Visit | Attending: Neurosurgery | Admitting: Neurosurgery

## 2011-04-23 ENCOUNTER — Other Ambulatory Visit: Payer: Self-pay

## 2011-04-23 ENCOUNTER — Encounter (HOSPITAL_COMMUNITY): Payer: Self-pay

## 2011-04-23 HISTORY — DX: Cerebral infarction, unspecified: I63.9

## 2011-04-23 HISTORY — DX: Essential (primary) hypertension: I10

## 2011-04-23 LAB — COMPREHENSIVE METABOLIC PANEL
ALT: 19 U/L (ref 0–35)
AST: 19 U/L (ref 0–37)
Albumin: 3.8 g/dL (ref 3.5–5.2)
Alkaline Phosphatase: 65 U/L (ref 39–117)
Chloride: 106 mEq/L (ref 96–112)
Potassium: 4.4 mEq/L (ref 3.5–5.1)
Sodium: 145 mEq/L (ref 135–145)
Total Protein: 6.7 g/dL (ref 6.0–8.3)

## 2011-04-23 LAB — CBC
HCT: 38.8 % (ref 36.0–46.0)
MCH: 31.5 pg (ref 26.0–34.0)
MCHC: 32.7 g/dL (ref 30.0–36.0)
MCV: 96.3 fL (ref 78.0–100.0)
RDW: 12.4 % (ref 11.5–15.5)
WBC: 10 10*3/uL (ref 4.0–10.5)

## 2011-04-23 NOTE — Progress Notes (Addendum)
2006.Marland Kitchen..Marland KitchenGETTING OUT OF SHOWER, HEAD WAS SPINNING, NAUSEA, RUNNING INTO PEOPLE, DROPPING THINGS...MRA  DONE....WAS PLACED ON PLAVIX  AND TAKES EVERY  SINCE....Marland KitchenHAD TIA  SPOKE WITH ALLISON REGARDING THE 2 VIEW CXR.......SINCE PT HAS NO REAL ISSUES WITH BP (TAKES LASIX), BUT DID HAVE IN 2006 A TIA AND HAS BEEN TAKING PLAVIX.Marland KitchenMarland KitchenTHE DECISION TO DO PCXR  DOS  WAS AGREED UPON....Marland KitchenREQUEST IN ORDERS MGMT

## 2011-04-23 NOTE — Pre-Procedure Instructions (Signed)
[address] Tiffany Wiggins  04/23/2011   Your procedure is scheduled on:  Wednesday, January 23 RD  Report to [facility] at  6:30 AM.  Call this number if you have problems the morning of surgery: [phone]   Remember:   Do not eat food:After Midnight TUESDAY.  May have clear liquids: up to 4 Hours before arrival 2:30 AM.  Clear liquids include soda, tea, black coffee, apple or grape juice, broth.   Take these medicines the morning of surgery with A SIP OF WATER: OMEPRAZOLE              AND TRAMADOL.   Do not wear jewelry, make-up or nail polish.   Do not wear lotions, powders, or perfumes. You may wear deodorant.  Do not shave 48 hours prior to surgery.  Do not bring valuables to the hospital.   Contacts, dentures or bridgework may not be worn into surgery.  Leave suitcase in the car. After surgery it may be brought to your room.  For patients admitted to the hospital, checkout time is 11:00 AM the day of discharge.   Patients discharged the day of surgery will not be allowed to drive home.  Name and phone number of your driver:  FRED  [place]  --  SPOUSE             [phone]  Special Instructions: CHG Shower Use Special Wash: 1/2 bottle night before surgery and 1/2 bottle morning of surgery.   Please read over the following fact sheets that you were given: Pain Booklet, MRSA Information and Surgical Site Infection Prevention

## 2011-04-24 MED ORDER — CEFAZOLIN SODIUM-DEXTROSE 2-3 GM-% IV SOLR
2.0000 g | INTRAVENOUS | Status: AC
  Administered 2011-04-25: 2 g via INTRAVENOUS

## 2011-04-24 MED ORDER — CEFAZOLIN SODIUM 1-5 GM-% IV SOLN: 1.0000 g | INTRAVENOUS | Status: DC

## 2011-04-25 ENCOUNTER — Ambulatory Visit (HOSPITAL_COMMUNITY): Payer: 59

## 2011-04-25 ENCOUNTER — Ambulatory Visit (HOSPITAL_COMMUNITY)
Admission: RE | Admit: 2011-04-25 | Discharge: 2011-04-26 | DRG: 491 | Disposition: A | Discharge status: Home / Self Care | Payer: 59 | Source: Ambulatory Visit | Attending: Neurosurgery | Admitting: Neurosurgery

## 2011-04-25 ENCOUNTER — Encounter (HOSPITAL_COMMUNITY): Payer: Self-pay | Admitting: Anesthesiology

## 2011-04-25 ENCOUNTER — Encounter (HOSPITAL_COMMUNITY): Payer: Self-pay | Admitting: *Deleted

## 2011-04-25 ENCOUNTER — Ambulatory Visit (HOSPITAL_COMMUNITY): Payer: 59 | Admitting: Anesthesiology

## 2011-04-25 ENCOUNTER — Encounter (HOSPITAL_COMMUNITY)
Admission: RE | Disposition: A | Discharge status: Home / Self Care | Payer: Self-pay | Source: Ambulatory Visit | Attending: Neurosurgery

## 2011-04-25 DIAGNOSIS — Z860100 Personal history of colon polyps, unspecified: Secondary | ICD-10-CM

## 2011-04-25 DIAGNOSIS — M5137 Other intervertebral disc degeneration, lumbosacral region: Secondary | ICD-10-CM | POA: Diagnosis present

## 2011-04-25 DIAGNOSIS — M25559 Pain in unspecified hip: Secondary | ICD-10-CM

## 2011-04-25 DIAGNOSIS — Z8711 Personal history of peptic ulcer disease: Secondary | ICD-10-CM

## 2011-04-25 DIAGNOSIS — M5126 Other intervertebral disc displacement, lumbar region: Secondary | ICD-10-CM | POA: Diagnosis present

## 2011-04-25 DIAGNOSIS — Z01812 Encounter for preprocedural laboratory examination: Secondary | ICD-10-CM

## 2011-04-25 DIAGNOSIS — L538 Other specified erythematous conditions: Secondary | ICD-10-CM

## 2011-04-25 DIAGNOSIS — M79609 Pain in unspecified limb: Secondary | ICD-10-CM

## 2011-04-25 DIAGNOSIS — R209 Unspecified disturbances of skin sensation: Secondary | ICD-10-CM

## 2011-04-25 DIAGNOSIS — E785 Hyperlipidemia, unspecified: Secondary | ICD-10-CM

## 2011-04-25 DIAGNOSIS — M51379 Other intervertebral disc degeneration, lumbosacral region without mention of lumbar back pain or lower extremity pain: Secondary | ICD-10-CM | POA: Diagnosis present

## 2011-04-25 DIAGNOSIS — A0472 Enterocolitis due to Clostridium difficile, not specified as recurrent: Secondary | ICD-10-CM

## 2011-04-25 DIAGNOSIS — Z8719 Personal history of other diseases of the digestive system: Secondary | ICD-10-CM

## 2011-04-25 DIAGNOSIS — K589 Irritable bowel syndrome without diarrhea: Secondary | ICD-10-CM | POA: Diagnosis present

## 2011-04-25 DIAGNOSIS — Z79899 Other long term (current) drug therapy: Secondary | ICD-10-CM

## 2011-04-25 DIAGNOSIS — R7309 Other abnormal glucose: Secondary | ICD-10-CM

## 2011-04-25 DIAGNOSIS — B351 Tinea unguium: Secondary | ICD-10-CM

## 2011-04-25 DIAGNOSIS — R002 Palpitations: Secondary | ICD-10-CM

## 2011-04-25 DIAGNOSIS — I6350 Cerebral infarction due to unspecified occlusion or stenosis of unspecified cerebral artery: Secondary | ICD-10-CM

## 2011-04-25 DIAGNOSIS — E663 Overweight: Secondary | ICD-10-CM

## 2011-04-25 DIAGNOSIS — Z01818 Encounter for other preprocedural examination: Secondary | ICD-10-CM

## 2011-04-25 DIAGNOSIS — N952 Postmenopausal atrophic vaginitis: Secondary | ICD-10-CM

## 2011-04-25 DIAGNOSIS — Z8673 Personal history of transient ischemic attack (TIA), and cerebral infarction without residual deficits: Secondary | ICD-10-CM

## 2011-04-25 DIAGNOSIS — L0293 Carbuncle, unspecified: Secondary | ICD-10-CM

## 2011-04-25 DIAGNOSIS — I10 Essential (primary) hypertension: Secondary | ICD-10-CM

## 2011-04-25 DIAGNOSIS — L0292 Furuncle, unspecified: Secondary | ICD-10-CM

## 2011-04-25 DIAGNOSIS — M549 Dorsalgia, unspecified: Secondary | ICD-10-CM

## 2011-04-25 HISTORY — PX: LUMBAR LAMINECTOMY/DECOMPRESSION MICRODISCECTOMY: SHX5026

## 2011-04-25 SURGERY — LUMBAR LAMINECTOMY/DECOMPRESSION MICRODISCECTOMY
Anesthesia: General | Site: Back | Laterality: Right | Wound class: Clean

## 2011-04-25 MED ORDER — PROMETHAZINE HCL 25 MG/ML IJ SOLN: 6.2500 mg | INTRAMUSCULAR | Status: DC | PRN

## 2011-04-25 MED ORDER — FENTANYL CITRATE 0.05 MG/ML IJ SOLN
INTRAMUSCULAR | Status: DC | PRN
  Administered 2011-04-25 (×4): 50 ug via INTRAVENOUS

## 2011-04-25 MED ORDER — FENTANYL CITRATE 0.05 MG/ML IJ SOLN
INTRAMUSCULAR | Status: AC
  Filled 2011-04-25: qty 2

## 2011-04-25 MED ORDER — GLYCOPYRROLATE 0.2 MG/ML IJ SOLN
INTRAMUSCULAR | Status: DC | PRN
  Administered 2011-04-25: 0.2 mg via INTRAVENOUS
  Administered 2011-04-25: .6 mg via INTRAVENOUS

## 2011-04-25 MED ORDER — SODIUM CHLORIDE 0.9 % IJ SOLN: 3.0000 mL | INTRAMUSCULAR | Status: DC | PRN

## 2011-04-25 MED ORDER — HEMOSTATIC AGENTS (NO CHARGE) OPTIME
TOPICAL | Status: DC | PRN
  Administered 2011-04-25: 1 via TOPICAL

## 2011-04-25 MED ORDER — MENTHOL 3 MG MT LOZG: 1.0000 | LOZENGE | OROMUCOSAL | Status: DC | PRN

## 2011-04-25 MED ORDER — ROSUVASTATIN CALCIUM 20 MG PO TABS
20.0000 mg | ORAL_TABLET | Freq: Every day | ORAL | Status: DC
  Administered 2011-04-25: 20 mg via ORAL
  Filled 2011-04-25 (×2): qty 1

## 2011-04-25 MED ORDER — PHENYLEPHRINE HCL 10 MG/ML IJ SOLN
10.0000 mg | INTRAVENOUS | Status: DC | PRN
  Administered 2011-04-25: 10 ug/min via INTRAVENOUS

## 2011-04-25 MED ORDER — ACETAMINOPHEN 325 MG PO TABS: 650.0000 mg | ORAL_TABLET | ORAL | Status: DC | PRN

## 2011-04-25 MED ORDER — NEOSTIGMINE METHYLSULFATE 1 MG/ML IJ SOLN
INTRAMUSCULAR | Status: DC | PRN
  Administered 2011-04-25: 4 mg via INTRAVENOUS

## 2011-04-25 MED ORDER — THROMBIN 5000 UNITS EX SOLR
CUTANEOUS | Status: DC | PRN
  Administered 2011-04-25: 5000 [IU] via TOPICAL

## 2011-04-25 MED ORDER — MIDAZOLAM HCL 5 MG/5ML IJ SOLN
INTRAMUSCULAR | Status: DC | PRN
  Administered 2011-04-25: 2 mg via INTRAVENOUS

## 2011-04-25 MED ORDER — BUPIVACAINE LIPOSOME 1.3 % IJ SUSP
INTRAMUSCULAR | Status: DC | PRN
  Administered 2011-04-25: 20 mL

## 2011-04-25 MED ORDER — CEFAZOLIN SODIUM 1-5 GM-% IV SOLN
1.0000 g | Freq: Three times a day (TID) | INTRAVENOUS | Status: AC
  Administered 2011-04-25 – 2011-04-26 (×2): 1 g via INTRAVENOUS
  Filled 2011-04-25 (×2): qty 50

## 2011-04-25 MED ORDER — METHYLPREDNISOLONE ACETATE 80 MG/ML IJ SUSP
INTRAMUSCULAR | Status: DC | PRN
  Administered 2011-04-25: 80 mg

## 2011-04-25 MED ORDER — FENTANYL CITRATE 0.05 MG/ML IJ SOLN
INTRAMUSCULAR | Status: DC | PRN
  Administered 2011-04-25: 100 ug via INTRAVENOUS

## 2011-04-25 MED ORDER — HYDROMORPHONE HCL PF 1 MG/ML IJ SOLN
0.2500 mg | INTRAMUSCULAR | Status: DC | PRN
  Administered 2011-04-25 (×4): 0.5 mg via INTRAVENOUS

## 2011-04-25 MED ORDER — LACTATED RINGERS IV SOLN: INTRAVENOUS | Status: DC

## 2011-04-25 MED ORDER — ACETAMINOPHEN 650 MG RE SUPP: 650.0000 mg | RECTAL | Status: DC | PRN

## 2011-04-25 MED ORDER — HYDROMORPHONE HCL PF 1 MG/ML IJ SOLN
INTRAMUSCULAR | Status: AC
  Administered 2011-04-25: 0.5 mg via INTRAVENOUS
  Filled 2011-04-25: qty 1

## 2011-04-25 MED ORDER — ROCURONIUM BROMIDE 100 MG/10ML IV SOLN
INTRAVENOUS | Status: DC | PRN
  Administered 2011-04-25: 50 mg via INTRAVENOUS

## 2011-04-25 MED ORDER — PROPOFOL 10 MG/ML IV EMUL
INTRAVENOUS | Status: DC | PRN
  Administered 2011-04-25: 140 mg via INTRAVENOUS

## 2011-04-25 MED ORDER — ZOLPIDEM TARTRATE 5 MG PO TABS: 10.0000 mg | ORAL_TABLET | Freq: Every evening | ORAL | Status: DC | PRN

## 2011-04-25 MED ORDER — DOCUSATE SODIUM 100 MG PO CAPS
100.0000 mg | ORAL_CAPSULE | Freq: Two times a day (BID) | ORAL | Status: DC
  Administered 2011-04-25: 100 mg via ORAL
  Filled 2011-04-25: qty 1

## 2011-04-25 MED ORDER — ONDANSETRON HCL 4 MG/2ML IJ SOLN
INTRAMUSCULAR | Status: DC | PRN
  Administered 2011-04-25: 4 mg via INTRAVENOUS

## 2011-04-25 MED ORDER — MEPERIDINE HCL 25 MG/ML IJ SOLN: 6.2500 mg | INTRAMUSCULAR | Status: DC | PRN

## 2011-04-25 MED ORDER — PHENOL 1.4 % MT LIQD: 1.0000 | OROMUCOSAL | Status: DC | PRN

## 2011-04-25 MED ORDER — DEXAMETHASONE SODIUM PHOSPHATE 4 MG/ML IJ SOLN
INTRAMUSCULAR | Status: DC | PRN
  Administered 2011-04-25: 4 mg via INTRAVENOUS

## 2011-04-25 MED ORDER — ONDANSETRON HCL 4 MG/2ML IJ SOLN: 4.0000 mg | INTRAMUSCULAR | Status: DC | PRN

## 2011-04-25 MED ORDER — EPHEDRINE SULFATE 50 MG/ML IJ SOLN
INTRAMUSCULAR | Status: DC | PRN
  Administered 2011-04-25 (×4): 5 mg via INTRAVENOUS

## 2011-04-25 MED ORDER — SODIUM CHLORIDE 0.9 % IV SOLN
INTRAVENOUS | Status: DC
  Administered 2011-04-25: 14:00:00 via INTRAVENOUS

## 2011-04-25 MED ORDER — SODIUM CHLORIDE 0.9 % IJ SOLN
3.0000 mL | Freq: Two times a day (BID) | INTRAMUSCULAR | Status: DC
  Administered 2011-04-25 (×2): 3 mL via INTRAVENOUS

## 2011-04-25 MED ORDER — PANTOPRAZOLE SODIUM 40 MG PO TBEC
40.0000 mg | DELAYED_RELEASE_TABLET | Freq: Every day | ORAL | Status: DC
  Administered 2011-04-25: 40 mg via ORAL
  Filled 2011-04-25: qty 1

## 2011-04-25 MED ORDER — CEFAZOLIN SODIUM-DEXTROSE 2-3 GM-% IV SOLR
INTRAVENOUS | Status: AC
  Filled 2011-04-25: qty 50

## 2011-04-25 MED ORDER — OXYCODONE-ACETAMINOPHEN 5-325 MG PO TABS
1.0000 | ORAL_TABLET | ORAL | Status: DC | PRN
  Administered 2011-04-25: 1 via ORAL
  Administered 2011-04-25 – 2011-04-26 (×3): 2 via ORAL
  Filled 2011-04-25 (×3): qty 2

## 2011-04-25 MED ORDER — BUPIVACAINE LIPOSOME 1.3 % IJ SUSP
1.0000 | INTRAMUSCULAR | Status: AC
  Filled 2011-04-25: qty 20

## 2011-04-25 MED ORDER — 0.9 % SODIUM CHLORIDE (POUR BTL) OPTIME
TOPICAL | Status: DC | PRN
  Administered 2011-04-25: 1000 mL

## 2011-04-25 MED ORDER — COLESEVELAM HCL 625 MG PO TABS
1875.0000 mg | ORAL_TABLET | Freq: Two times a day (BID) | ORAL | Status: DC
  Administered 2011-04-25 – 2011-04-26 (×2): 1875 mg via ORAL
  Filled 2011-04-25 (×4): qty 3

## 2011-04-25 MED ORDER — OXYCODONE-ACETAMINOPHEN 5-325 MG PO TABS
ORAL_TABLET | ORAL | Status: AC
  Filled 2011-04-25: qty 2

## 2011-04-25 MED ORDER — FUROSEMIDE 40 MG PO TABS
40.0000 mg | ORAL_TABLET | Freq: Every day | ORAL | Status: DC
  Administered 2011-04-25: 40 mg via ORAL
  Filled 2011-04-25 (×2): qty 1

## 2011-04-25 MED ORDER — MORPHINE SULFATE 4 MG/ML IJ SOLN
4.0000 mg | INTRAMUSCULAR | Status: DC | PRN
  Administered 2011-04-25: 4 mg via INTRAVENOUS
  Filled 2011-04-25: qty 1

## 2011-04-25 MED ORDER — LACTATED RINGERS IV SOLN
INTRAVENOUS | Status: DC | PRN
  Administered 2011-04-25 (×2): via INTRAVENOUS

## 2011-04-25 SURGICAL SUPPLY — 67 items
ADH SKN CLS APL DERMABOND .7 (GAUZE/BANDAGES/DRESSINGS) ×2
APL SKNCLS STERI-STRIP NONHPOA (GAUZE/BANDAGES/DRESSINGS) ×1
BENZOIN TINCTURE PRP APPL 2/3 (GAUZE/BANDAGES/DRESSINGS) ×2 IMPLANT
BLADE SURG ROTATE 9660 (MISCELLANEOUS) IMPLANT
BUR ACORN 6.0 (BURR) ×1 IMPLANT
BUR MATCHSTICK NEURO 3.0 LAGG (BURR) ×2 IMPLANT
CANISTER SUCTION 2500CC (MISCELLANEOUS) ×2 IMPLANT
CLOSURE STERI STRIP 1/2 X4 (GAUZE/BANDAGES/DRESSINGS) ×2 IMPLANT
CLOTH BEACON ORANGE TIMEOUT ST (SAFETY) ×2 IMPLANT
CONT SPEC 4OZ CLIKSEAL STRL BL (MISCELLANEOUS) ×2 IMPLANT
CORDS BIPOLAR (ELECTRODE) ×2 IMPLANT
DERMABOND ADVANCED (GAUZE/BANDAGES/DRESSINGS) ×2
DERMABOND ADVANCED .7 DNX12 (GAUZE/BANDAGES/DRESSINGS) IMPLANT
DRAPE LAPAROTOMY 100X72X124 (DRAPES) ×2 IMPLANT
DRAPE MICROSCOPE LEICA (MISCELLANEOUS) ×2 IMPLANT
DRAPE POUCH INSTRU U-SHP 10X18 (DRAPES) ×2 IMPLANT
DRSG PAD ABDOMINAL 8X10 ST (GAUZE/BANDAGES/DRESSINGS) IMPLANT
DURAPREP 26ML APPLICATOR (WOUND CARE) ×2 IMPLANT
ELECT BLADE 4.0 EZ CLEAN MEGAD (MISCELLANEOUS) ×2
ELECT REM PT RETURN 9FT ADLT (ELECTROSURGICAL) ×2
ELECTRODE BLDE 4.0 EZ CLN MEGD (MISCELLANEOUS) IMPLANT
ELECTRODE REM PT RTRN 9FT ADLT (ELECTROSURGICAL) ×1 IMPLANT
GAUZE SPONGE 4X4 16PLY XRAY LF (GAUZE/BANDAGES/DRESSINGS) IMPLANT
GLOVE BIOGEL M 8.0 STRL (GLOVE) ×2 IMPLANT
GLOVE BIOGEL PI IND STRL 8 (GLOVE) ×1 IMPLANT
GLOVE BIOGEL PI IND STRL 8.5 (GLOVE) IMPLANT
GLOVE BIOGEL PI INDICATOR 8 (GLOVE) ×1
GLOVE BIOGEL PI INDICATOR 8.5 (GLOVE) ×1
GLOVE ECLIPSE 7.5 STRL STRAW (GLOVE) ×1 IMPLANT
GLOVE EXAM NITRILE LRG STRL (GLOVE) IMPLANT
GLOVE EXAM NITRILE MD LF STRL (GLOVE) IMPLANT
GLOVE EXAM NITRILE XL STR (GLOVE) IMPLANT
GLOVE EXAM NITRILE XS STR PU (GLOVE) IMPLANT
GLOVE SURG SS PI 8.0 STRL IVOR (GLOVE) ×4 IMPLANT
GOWN BRE IMP SLV AUR LG STRL (GOWN DISPOSABLE) ×2 IMPLANT
GOWN BRE IMP SLV AUR XL STRL (GOWN DISPOSABLE) ×1 IMPLANT
GOWN STRL REIN 2XL LVL4 (GOWN DISPOSABLE) ×2 IMPLANT
KIT BASIN OR (CUSTOM PROCEDURE TRAY) ×2 IMPLANT
KIT ROOM TURNOVER OR (KITS) ×2 IMPLANT
NDL HYPO 18GX1.5 BLUNT FILL (NEEDLE) IMPLANT
NDL HYPO 21X1.5 SAFETY (NEEDLE) IMPLANT
NDL HYPO 25X1 1.5 SAFETY (NEEDLE) IMPLANT
NDL SPNL 20GX3.5 QUINCKE YW (NEEDLE) IMPLANT
NEEDLE HYPO 18GX1.5 BLUNT FILL (NEEDLE) ×2 IMPLANT
NEEDLE HYPO 21X1.5 SAFETY (NEEDLE) ×2 IMPLANT
NEEDLE HYPO 25X1 1.5 SAFETY (NEEDLE) ×2 IMPLANT
NEEDLE SPNL 20GX3.5 QUINCKE YW (NEEDLE) ×2 IMPLANT
NS IRRIG 1000ML POUR BTL (IV SOLUTION) ×2 IMPLANT
PACK LAMINECTOMY NEURO (CUSTOM PROCEDURE TRAY) ×2 IMPLANT
PAD ARMBOARD 7.5X6 YLW CONV (MISCELLANEOUS) ×6 IMPLANT
PATTIES SURGICAL .5 X1 (DISPOSABLE) ×1 IMPLANT
RUBBERBAND STERILE (MISCELLANEOUS) ×4 IMPLANT
SPONGE GAUZE 4X4 12PLY (GAUZE/BANDAGES/DRESSINGS) ×2 IMPLANT
SPONGE LAP 4X18 X RAY DECT (DISPOSABLE) IMPLANT
SPONGE SURGIFOAM ABS GEL SZ50 (HEMOSTASIS) ×2 IMPLANT
STRIP CLOSURE SKIN 1/2X4 (GAUZE/BANDAGES/DRESSINGS) ×2 IMPLANT
SUT VIC AB 0 CT1 18XCR BRD8 (SUTURE) ×1 IMPLANT
SUT VIC AB 0 CT1 8-18 (SUTURE) ×2
SUT VIC AB 2-0 CP2 18 (SUTURE) ×2 IMPLANT
SUT VIC AB 3-0 SH 8-18 (SUTURE) ×2 IMPLANT
SYR 20CC LL (SYRINGE) ×1 IMPLANT
SYR 20ML ECCENTRIC (SYRINGE) ×2 IMPLANT
SYR 3ML LL SCALE MARK (SYRINGE) ×1 IMPLANT
SYR 5ML LL (SYRINGE) IMPLANT
TOWEL OR 17X24 6PK STRL BLUE (TOWEL DISPOSABLE) ×2 IMPLANT
TOWEL OR 17X26 10 PK STRL BLUE (TOWEL DISPOSABLE) ×2 IMPLANT
WATER STERILE IRR 1000ML POUR (IV SOLUTION) ×2 IMPLANT

## 2011-04-25 NOTE — Op Note (Signed)
Tiffany Wiggins, Tiffany Wiggins                 ACCOUNT NO.:  [id number]  MEDICAL RECORD NO.:  [id number]  LOCATION:  3524                         FACILITY:  [facility]  PHYSICIAN:  Hilda Lias, M.D.   DATE OF BIRTH:  1945/08/26  DATE OF PROCEDURE:  04/25/2011 DATE OF DISCHARGE:                              OPERATIVE REPORT   PREOPERATIVE DIAGNOSIS:  Right L4-L5 herniated disk.  POSTOPERATIVE DIAGNOSIS:  Right L4-L5 herniated disk.  PROCEDURE:  Right L4-5 diskectomy, foraminotomy, decompression of the thecal sac as well as the L5 nerve root.  Hemifacetectomy.  Microscope.  SURGEON:  Hilda Lias, MD  ASSISTANT:  Hewitt Shorts, MD  CLINICAL HISTORY:  Ms. Holdsworth is a lady complaining of back pain with radiation to the right leg for almost a year.  We repeated the MRI because the pain was getting worse.  She has herniated disk at L4-5, centered to the right.  Surgery was advised.  The risks were fully explained to her and her husband including possibility of infection, CSF leak, need for further surgery.  PROCEDURE:  The patient was taken to the OR, and after intubation, she was positioned in a prone manner.  The back was cleaned with DuraPrep. Drapes were applied.  A midline incision from L4 to L5 was made, and muscle retracted laterally.  X-rays showed that indeed we were at the L4- 5.  Because of her obesity, we brought the microscope into the area. Immediately, we started drilling the lower lamina of L4 and the upper of L5.  The yellow ligament was also excised.  With the drill, we removed 1/3 of the medial facet.  Immediately, we found the thecal sac and indeed the thecal sac, as well as the L5 nerve root, was pushed backward.  Retraction was not made.  There was a large herniated disk with extension into the foramen.  Incision was made and immediately a large amount of degenerative disk was removed.  Total gross diskectomy medial and lateral was accomplished.  From then on, the  area was irrigated.  Fentanyl and Depo-Medrol were left in the epidural space, and the wound was closed with Vicryl and Steri-Strips.          ______________________________ Hilda Lias, M.D.     EB/MEDQ  D:  04/25/2011  T:  04/25/2011  Job:  [id number]

## 2011-04-25 NOTE — Progress Notes (Signed)
Patient ID: Tiffany Wiggins, female   DOB: [birth year], 66 y.o.   MRN: [id number] Patient is NOT DIABETIC but has a family history of it. Right l4 5 discectomy was done and the op number is [phone]

## 2011-04-25 NOTE — Preoperative (Signed)
Beta Blockers   Reason not to administer Beta Blockers: not prescribed 

## 2011-04-25 NOTE — Anesthesia Postprocedure Evaluation (Signed)
  Anesthesia Post-op Note  Patient: Tiffany Wiggins  Procedure(s) Performed:  LUMBAR LAMINECTOMY/DECOMPRESSION MICRODISCECTOMY - Right Lumbar Four-Five Discectomy  Patient Location: PACU  Anesthesia Type: General  Level of Consciousness: awake  Airway and Oxygen Therapy: Patient Spontanous Breathing  Post-op Pain: mild  Post-op Assessment: Post-op Vital signs reviewed  Post-op Vital Signs: stable  Complications: No apparent anesthesia complications

## 2011-04-25 NOTE — Anesthesia Preprocedure Evaluation (Addendum)
Anesthesia Evaluation  Patient identified by MRN, date of birth, ID band Patient awake    Reviewed: Allergy & Precautions, H&P , NPO status , Patient's Chart, lab work & pertinent test results, reviewed documented beta blocker date and time   Airway Mallampati: I  Neck ROM: Full    Dental  (+) Teeth Intact and Dental Advisory Given   Pulmonary  clear to auscultation        Cardiovascular hypertension, Pt. on medications Regular Normal    Neuro/Psych  Headaches, Pt has history of TIA - no residuals TIACVA, No Residual Symptoms    GI/Hepatic PUD,   Endo/Other    Renal/GU      Musculoskeletal   Abdominal   Peds  Hematology   Anesthesia Other Findings   Reproductive/Obstetrics                          Anesthesia Physical Anesthesia Plan  ASA: II  Anesthesia Plan: General   Post-op Pain Management:    Induction: Intravenous  Airway Management Planned: Oral ETT  Additional Equipment:   Intra-op Plan:   Post-operative Plan: Extubation in OR  Informed Consent: I have reviewed the patients History and Physical, chart, labs and discussed the procedure including the risks, benefits and alternatives for the proposed anesthesia with the patient or authorized representative who has indicated his/her understanding and acceptance.   Dental advisory given  Plan Discussed with: CRNA and Surgeon  Anesthesia Plan Comments:        Anesthesia Quick Evaluation

## 2011-04-25 NOTE — Transfer of Care (Signed)
Immediate Anesthesia Transfer of Care Note  Patient: Tiffany Wiggins  Procedure(s) Performed:  LUMBAR LAMINECTOMY/DECOMPRESSION MICRODISCECTOMY - Right Lumbar Four-Five Discectomy  Patient Location: PACU  Anesthesia Type: General  Level of Consciousness: awake  Airway & Oxygen Therapy: Patient Spontanous Breathing and Patient connected to nasal cannula oxygen  Post-op Assessment: Report given to PACU RN, Post -op Vital signs reviewed and stable and Patient moving all extremities  Post vital signs: Reviewed and stable  Complications: No apparent anesthesia complications

## 2011-04-25 NOTE — H&P (Signed)
Tiffany Wiggins is an 66 y.o. female.   Chief Complaint:lbp with right legpain HPI: lbp since April 2012 with radiation to right leg associaated with burmning sensation in right foot  Past Medical History  Diagnosis Date  . Hyperglycemia   . Hyperlipidemia   . PUD (peptic ulcer disease)   . IBS (irritable bowel syndrome)   . Hypertension     PCP GAVE FOR HER  BLOOD PRESSURE  . Stroke     TIA  BACK IN  04/2004  . Headache     Past Surgical History  Procedure Date  . Tonsillectomy   . Laparoscopy for fertility work up   . Abdominal hysterectomy   . Oophorectomy   . Tonsillectomy     Family History  Problem Relation Age of Onset  . Cancer Sister     lung  . Diabetes Sister   . Cancer Brother     lung   Social History:  reports that she quit smoking about 7 years ago. Her smoking use included Cigarettes. She does not have any smokeless tobacco history on file. She reports that she drinks about 1.2 ounces of alcohol per week. She reports that she does not use illicit drugs.  Allergies:  Allergies  Allergen Reactions  . Codeine Nausea Only    Medications Prior to Admission  Medication Dose Route Frequency Provider Last Rate Last Dose  . ceFAZolin (ANCEF) 2-3 GM-% IVPB SOLR           . ceFAZolin (ANCEF) IVPB 2 g/50 mL premix  2 g Intravenous 60 min Pre-Op Karn Cassis, MD      . DISCONTD: ceFAZolin (ANCEF) IVPB 1 g/50 mL premix  1 g Intravenous 60 min Pre-Op Karn Cassis, MD       Medications Prior to Admission  Medication Sig Dispense Refill  . colesevelam (WELCHOL) 625 MG tablet Take 3 tablets (1,875 mg total) by mouth 2 (two) times daily with a meal.  180 tablet  6  . MULTIPLE VITAMIN PO Take 1 tablet by mouth daily.       Marland Kitchen omeprazole (PRILOSEC) 20 MG capsule Take 20 mg by mouth daily.       Marland Kitchen PLAVIX 75 MG tablet TAKE ONE TABLET BY MOUTH EVERY DAY  30 each  6  . simvastatin (ZOCOR) 80 MG tablet TAKE ONE TABLET BY MOUTH AT BEDTIME  30 tablet  11  . traMADol  (ULTRAM) 50 MG tablet TAKE ONE TO TWO TABLETS BY MOUTH EVERY 6 HOURS AS NEEDED  60 tablet  0    Results for orders placed during the hospital encounter of 04/23/11 (from the past 48 hour(s))  SURGICAL PCR SCREEN     Status: Normal   Collection Time   04/23/11  3:48 PM      Component Value Range Comment   MRSA, PCR NEGATIVE  NEGATIVE     Staphylococcus aureus NEGATIVE  NEGATIVE    COMPREHENSIVE METABOLIC PANEL     Status: Abnormal   Collection Time   04/23/11  3:59 PM      Component Value Range Comment   Sodium 145  135 - 145 (mEq/L)    Potassium 4.4  3.5 - 5.1 (mEq/L)    Chloride 106  96 - 112 (mEq/L)    CO2 30  19 - 32 (mEq/L)    Glucose, Bld 164 (*) 70 - 99 (mg/dL)    BUN 12  6 - 23 (mg/dL)    Creatinine, Ser 1.61  0.50 - 1.10 (mg/dL)    Calcium 16.1  8.4 - 10.5 (mg/dL)    Total Protein 6.7  6.0 - 8.3 (g/dL)    Albumin 3.8  3.5 - 5.2 (g/dL)    AST 19  0 - 37 (U/L)    ALT 19  0 - 35 (U/L)    Alkaline Phosphatase 65  39 - 117 (U/L)    Total Bilirubin 0.4  0.3 - 1.2 (mg/dL)    GFR calc non Af Amer 63 (*) >90 (mL/min)    GFR calc Af Amer 73 (*) >90 (mL/min)   CBC     Status: Normal   Collection Time   04/23/11  3:59 PM      Component Value Range Comment   WBC 10.0  4.0 - 10.5 (K/uL)    RBC 4.03  3.87 - 5.11 (MIL/uL)    Hemoglobin 12.7  12.0 - 15.0 (g/dL)    HCT 09.6  04.5 - 40.9 (%)    MCV 96.3  78.0 - 100.0 (fL)    MCH 31.5  26.0 - 34.0 (pg)    MCHC 32.7  30.0 - 36.0 (g/dL)    RDW 81.1  91.4 - 78.2 (%)    Platelets 200  150 - 400 (K/uL)    Dg Chest 2 View  04/25/2011  *RADIOLOGY REPORT*  Clinical Data: Preoperative chest radiograph.  CHEST - 2 VIEW  Comparison: None.  Findings: The lungs are well-aerated.  Minimal right basilar atelectasis is noted.  There is no evidence of focal opacification, pleural effusion or pneumothorax.  The heart is borderline normal in size; calcification is noted within the aortic arch.  No acute osseous abnormalities are seen.  IMPRESSION: Minimal  right basilar atelectasis; lungs otherwise clear.  Original Report Authenticated By: Tonia Ghent, M.D.    Review of Systems  Constitutional: Negative.   HENT: Negative.   Eyes: Negative.   Respiratory: Negative.   Cardiovascular: Negative.   Gastrointestinal: Negative.        History of diabetes mellitus  Genitourinary: Negative.   Musculoskeletal: Positive for back pain.  Skin: Negative.   Neurological: Negative.   Endo/Heme/Allergies: Negative.   Psychiatric/Behavioral: Negative.  The patient is not nervous/anxious.     Blood pressure 150/72, pulse 70, temperature 98 F (36.7 C), temperature source Oral, resp. rate 20, SpO2 94.00%. Physical Exam hent,nl. Neck, nl. Cv, nl respiratory ,nl. Abdomen,nl. Extremiries, nl. NEURO WEAKNWESS OF DF OF RIGHT FOOT. straight leg-raise positive at 60 in right, negative in left. 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  Assessment/Plan Right l4 5 discectomy and foraminotomy.  Baltazar Pekala M 04/25/2011, 8:38 AM

## 2011-04-25 NOTE — Anesthesia Procedure Notes (Signed)
Procedure Name: Intubation Date/Time: 04/25/2011 8:53 AM Performed by: Elizbeth Squires Pre-anesthesia Checklist: Patient identified, Emergency Drugs available, Suction available and Patient being monitored Patient Re-evaluated:Patient Re-evaluated prior to inductionOxygen Delivery Method: Circle System Utilized Preoxygenation: Pre-oxygenation with 100% oxygen Intubation Type: IV induction Ventilation: Mask ventilation without difficulty Laryngoscope Size: Mac and 3 Grade View: Grade I Tube type: Oral Tube size: 7.5 mm Number of attempts: 1 Airway Equipment and Method: stylet Placement Confirmation: ETT inserted through vocal cords under direct vision,  positive ETCO2 and breath sounds checked- equal and bilateral Secured at: 20 cm Tube secured with: Tape Dental Injury: Teeth and Oropharynx as per pre-operative assessment

## 2011-04-26 ENCOUNTER — Encounter: Payer: Self-pay | Admitting: *Deleted

## 2011-04-26 NOTE — Discharge Summary (Signed)
  Patient admission and discharge diagnosis right l 45 hnp.condition at dc, no pain  No current facility-administered medications on file prior to encounter.   Current Outpatient Prescriptions on File Prior to Encounter  Medication Sig Dispense Refill  . colesevelam (WELCHOL) 625 MG tablet Take 3 tablets (1,875 mg total) by mouth 2 (two) times daily with a meal.  180 tablet  6  . MULTIPLE VITAMIN PO Take 1 tablet by mouth daily.       Marland Kitchen omeprazole (PRILOSEC) 20 MG capsule Take 20 mg by mouth daily.       Marland Kitchen PLAVIX 75 MG tablet TAKE ONE TABLET BY MOUTH EVERY DAY  30 each  6  . simvastatin (ZOCOR) 80 MG tablet TAKE ONE TABLET BY MOUTH AT BEDTIME  30 tablet  11  . traMADol (ULTRAM) 50 MG tablet TAKE ONE TO TWO TABLETS BY MOUTH EVERY 6 HOURS AS NEEDED  60 tablet  0  , percocet and diazepam.diet,regular. activiy no lifting. F/u 3 to 4 weeks.

## 2011-06-15 ENCOUNTER — Other Ambulatory Visit: Payer: Self-pay | Admitting: Internal Medicine

## 2011-08-14 ENCOUNTER — Ambulatory Visit: Payer: 59 | Admitting: Internal Medicine

## 2011-08-19 ENCOUNTER — Other Ambulatory Visit: Payer: Self-pay | Admitting: Internal Medicine

## 2011-08-20 ENCOUNTER — Ambulatory Visit: Payer: 59 | Admitting: Internal Medicine

## 2011-09-10 ENCOUNTER — Ambulatory Visit (HOSPITAL_COMMUNITY)
Admission: RE | Admit: 2011-09-10 | Discharge: 2011-09-10 | Disposition: A | Discharge status: Home / Self Care | Payer: 59 | Source: Ambulatory Visit | Attending: Internal Medicine | Admitting: Internal Medicine

## 2011-09-10 ENCOUNTER — Other Ambulatory Visit: Payer: Self-pay | Admitting: Internal Medicine

## 2011-09-10 ENCOUNTER — Ambulatory Visit (INDEPENDENT_AMBULATORY_CARE_PROVIDER_SITE_OTHER): Payer: 59 | Admitting: Internal Medicine

## 2011-09-10 ENCOUNTER — Encounter: Payer: Self-pay | Admitting: Internal Medicine

## 2011-09-10 ENCOUNTER — Other Ambulatory Visit (INDEPENDENT_AMBULATORY_CARE_PROVIDER_SITE_OTHER): Payer: 59

## 2011-09-10 VITALS — BP 132/74 | HR 85 | Temp 98.1°F | Resp 16 | Ht 66.0 in | Wt 185.0 lb

## 2011-09-10 DIAGNOSIS — R52 Pain, unspecified: Secondary | ICD-10-CM

## 2011-09-10 DIAGNOSIS — R1031 Right lower quadrant pain: Secondary | ICD-10-CM

## 2011-09-10 DIAGNOSIS — M549 Dorsalgia, unspecified: Secondary | ICD-10-CM

## 2011-09-10 DIAGNOSIS — I700 Atherosclerosis of aorta: Secondary | ICD-10-CM | POA: Insufficient documentation

## 2011-09-10 DIAGNOSIS — R05 Cough: Secondary | ICD-10-CM

## 2011-09-10 DIAGNOSIS — K5730 Diverticulosis of large intestine without perforation or abscess without bleeding: Secondary | ICD-10-CM | POA: Insufficient documentation

## 2011-09-10 DIAGNOSIS — R509 Fever, unspecified: Secondary | ICD-10-CM | POA: Insufficient documentation

## 2011-09-10 DIAGNOSIS — K5289 Other specified noninfective gastroenteritis and colitis: Secondary | ICD-10-CM | POA: Insufficient documentation

## 2011-09-10 LAB — CBC WITH DIFFERENTIAL/PLATELET
Basophils Relative: 0.4 % (ref 0.0–3.0)
Eosinophils Relative: 0.9 % (ref 0.0–5.0)
Lymphocytes Relative: 22.5 % (ref 12.0–46.0)
Monocytes Absolute: 0.6 10*3/uL (ref 0.1–1.0)
Monocytes Relative: 6.2 % (ref 3.0–12.0)
Neutrophils Relative %: 70 % (ref 43.0–77.0)
Platelets: 192 10*3/uL (ref 150.0–400.0)
RBC: 3.89 Mil/uL (ref 3.87–5.11)
WBC: 9.7 10*3/uL (ref 4.5–10.5)

## 2011-09-10 LAB — COMPREHENSIVE METABOLIC PANEL
Albumin: 3.9 g/dL (ref 3.5–5.2)
Alkaline Phosphatase: 65 U/L (ref 39–117)
Calcium: 9.6 mg/dL (ref 8.4–10.5)
Chloride: 107 mEq/L (ref 96–112)
Glucose, Bld: 143 mg/dL — ABNORMAL HIGH (ref 70–99)
Potassium: 4.4 mEq/L (ref 3.5–5.1)
Sodium: 146 mEq/L — ABNORMAL HIGH (ref 135–145)
Total Protein: 6.9 g/dL (ref 6.0–8.3)

## 2011-09-10 MED ORDER — BENZONATATE 100 MG PO CAPS: 100.0000 mg | ORAL_CAPSULE | Freq: Two times a day (BID) | ORAL | Status: AC | PRN

## 2011-09-10 MED ORDER — AMOXICILLIN-POT CLAVULANATE 875-125 MG PO TABS: 1.0000 | ORAL_TABLET | Freq: Two times a day (BID) | ORAL | Status: AC

## 2011-09-10 MED ORDER — IOHEXOL 300 MG/ML  SOLN
100.0000 mL | Freq: Once | INTRAMUSCULAR | Status: AC | PRN
  Administered 2011-09-10: 100 mL via INTRAVENOUS

## 2011-09-10 NOTE — Assessment & Plan Note (Signed)
Patient with severe buttock pain but normal hip exam and back exam without clear cut radiculopathy. Concern for disk problems vs muscular pain, i.e. Piriformis syndrome.  Plan MRI L-S spine  Anti-inflammatory treatment  reconsult Dr. Jeral Fruit if MRI positive.  Refer to PT if MRI w/o acute changes

## 2011-09-10 NOTE — Progress Notes (Signed)
Addended by: Jacques Navy on: 09/10/2011 10:43 PM   Modules accepted: Orders

## 2011-09-10 NOTE — Progress Notes (Signed)
Subjective:    Patient ID: Tiffany Wiggins, female    DOB: Sep 02, 1945, 66 y.o.   MRN: [id number]  HPI Mrs. Whan is s/p L34 diskectomy in Jan '13. She done OK but over the past several weeks has had increased pain in the right buttock and groin. This is intemittent but can be severe limiting her ability to ambulate. She denies frank weakness or paresthesia. She can ambulate.  She also has had pain in the right lower abdomen/groin, mild nausea and loss of appetite, feeling ill and feverish. She has had BMs. She has not had rigors.  Past Medical History  Diagnosis Date  . Hyperglycemia   . Hyperlipidemia   . PUD (peptic ulcer disease)   . IBS (irritable bowel syndrome)   . Hypertension     PCP GAVE FOR HER  BLOOD PRESSURE  . Stroke     TIA  BACK IN  04/2004  . Headache    Past Surgical History  Procedure Date  . Tonsillectomy   . Laparoscopy for fertility work up   . Abdominal hysterectomy   . Oophorectomy   . Tonsillectomy   . Lumbar laminectomy/decompression microdiscectomy 04/25/2011    Procedure: LUMBAR LAMINECTOMY/DECOMPRESSION MICRODISCECTOMY;  Surgeon: Karn Cassis, MD;  Location: [facility];  Service: Neurosurgery;  Laterality: Right;  Right Lumbar Four-Five Discectomy   Family History  Problem Relation Age of Onset  . Cancer Sister     lung  . Diabetes Sister   . Cancer Brother     lung   History   Social History  . Marital Status: Married    Spouse Name: N/A    Number of Children: N/A  . Years of Education: N/A   Occupational History  . Not on file.   Social History Main Topics  . Smoking status: Former Smoker    Types: Cigarettes    Quit date: 04/02/2004  . Smokeless tobacco: Not on file  . Alcohol Use: 1.2 oz/week    2 Glasses of wine per week  . Drug Use: No  . Sexually Active: Not on file   Other Topics Concern  . Not on file   Social History Narrative  . No narrative on file       Review of Systems System review is negative for  any constitutional, cardiac, pulmonary, GI or neuro symptoms or complaints other than as described in the HPI.     Objective:   Physical Exam Filed Vitals:   09/10/11 1323  BP: 132/74  Pulse: 85  Temp: 98.1 F (36.7 C)  Resp: 16   Wt Readings from Last 3 Encounters:  09/10/11 185 lb (83.915 kg)  04/23/11 198 lb 6.4 oz (89.994 kg)  09/04/10 198 lb (89.812 kg)   Gen'l- WNWD white woman, overweight, in no acute distress Cor- RRR Pulm - CTAP Abd - BS hypoactive, acute pain to palpation at McBurney's point with some guarding. MSK - back:Back exam: normal stand; hindered gait; normal toe/heel walk; normal step up to exam table with 1 + assistance; normal SLR sitting and supine; normal DTRs at the patellar tendons; normal sensation to light touch, pin-prick and deep vibratory stimulus; no  CVA tenderness; unable to move supine to sitting witout assistance.        Assessment & Plan:  1. Acute RLQ abdominal pain - patients symptoms are suggestive of appendicitis and on exam she is acutely tender with rebound/guarding.  Plan -  STAT labs: CBC, metabolic  CT abd/pelvis with contrast  emergently this evening  2. Tickle cough for 7 days  Plan - Tessalon perles 100 mg tid.

## 2011-09-10 NOTE — Patient Instructions (Signed)
Leg pain - no signs of a radiculopathy on exam (pinched nerve). This appears to be a muscular syndrome. Plan - MRI of the L-S spine  Acute right lower quadrant abdominal - very tender at McBurney's point, with nausea, feeling punk, question of low grade fever, low energy - obligated to rule out appendicitis.  Plan - lab now: CBC and metabolic panel           CT abdomen/pelvis this PM

## 2011-09-10 NOTE — Progress Notes (Signed)
  Subjective:    Patient ID: Tiffany Wiggins, female    DOB: [birth year], 66 y.o.   MRN: [id number]  HPI    Review of Systems     Objective:   Physical Exam        Assessment & Plan:  1. Right lower quadrant abdominal pain -  Called report: CT did reveal absent appendix, however there was small bowel wall edema and surrounding stranding c/w infection small bowel enteritis  Plan Patient called with result  Rx: augmentin 875 mg bid x 10

## 2011-09-20 ENCOUNTER — Other Ambulatory Visit: Payer: Self-pay | Admitting: *Deleted

## 2011-09-20 MED ORDER — FUROSEMIDE 40 MG PO TABS: 40.0000 mg | ORAL_TABLET | Freq: Every day | ORAL | Status: DC

## 2011-09-20 NOTE — Telephone Encounter (Signed)
Rx sent to [facility] furosemide

## 2011-09-21 ENCOUNTER — Ambulatory Visit
Admission: RE | Admit: 2011-09-21 | Discharge: 2011-09-21 | Disposition: A | Discharge status: Home / Self Care | Payer: 59 | Source: Ambulatory Visit | Attending: Internal Medicine | Admitting: Internal Medicine

## 2011-09-21 DIAGNOSIS — M549 Dorsalgia, unspecified: Secondary | ICD-10-CM

## 2011-09-23 ENCOUNTER — Encounter: Payer: Self-pay | Admitting: Internal Medicine

## 2011-10-01 ENCOUNTER — Other Ambulatory Visit: Payer: Self-pay | Admitting: Internal Medicine

## 2011-10-01 ENCOUNTER — Telehealth: Payer: Self-pay | Admitting: Internal Medicine

## 2011-10-01 MED ORDER — GABAPENTIN 100 MG PO CAPS: 100.0000 mg | ORAL_CAPSULE | Freq: Three times a day (TID) | ORAL | Status: DC

## 2011-10-01 NOTE — Telephone Encounter (Signed)
Left message on answer machine of instruction for gabapentin. And aleve.

## 2011-10-01 NOTE — Telephone Encounter (Signed)
Please call patient: will start gabapentin for sciatica: 1 tab daily x 3 days, 1 tab twice a day x 3 days then 1 tab three times a day. After 5-7 days if needed can increase to 2 tabs three times a day and we can continue to increase dose slowly until we get to about 1800 mg daily. Continue with the Aleve 2 tabs twice a day but be watchful for stomach irritation.  Rx sent to pharmacy for gabapentin

## 2011-10-20 ENCOUNTER — Other Ambulatory Visit: Payer: Self-pay | Admitting: Internal Medicine

## 2011-11-08 ENCOUNTER — Ambulatory Visit (INDEPENDENT_AMBULATORY_CARE_PROVIDER_SITE_OTHER): Payer: 59 | Admitting: Internal Medicine

## 2011-11-08 ENCOUNTER — Encounter: Payer: Self-pay | Admitting: Internal Medicine

## 2011-11-08 VITALS — BP 142/80 | HR 53 | Temp 97.0°F | Resp 16 | Wt 181.0 lb

## 2011-11-08 DIAGNOSIS — M79609 Pain in unspecified limb: Secondary | ICD-10-CM

## 2011-11-08 DIAGNOSIS — M79604 Pain in right leg: Secondary | ICD-10-CM

## 2011-11-08 MED ORDER — CYCLOBENZAPRINE HCL 5 MG PO TABS: 5.0000 mg | ORAL_TABLET | Freq: Three times a day (TID) | ORAL | Status: AC | PRN

## 2011-11-08 NOTE — Progress Notes (Signed)
Subjective:    Patient ID: Tiffany Wiggins, female    DOB: 1946/01/11, 66 y.o.   MRN: [id number]  HPI Tiffany Wiggins presents for persistent intermittent severe pain in the right lower buttock with radiation to the groin, hip and leg. This is different than sciatica. It can bring her to tears.   Past Medical History  Diagnosis Date  . Hyperglycemia   . Hyperlipidemia   . PUD (peptic ulcer disease)   . IBS (irritable bowel syndrome)   . Hypertension     PCP GAVE FOR HER  BLOOD PRESSURE  . Stroke     TIA  BACK IN  04/2004  . Headache    Past Surgical History  Procedure Date  . Tonsillectomy   . Laparoscopy for fertility work up   . Abdominal hysterectomy   . Oophorectomy   . Tonsillectomy   . Lumbar laminectomy/decompression microdiscectomy 04/25/2011    Procedure: LUMBAR LAMINECTOMY/DECOMPRESSION MICRODISCECTOMY;  Surgeon: Karn Cassis, MD;  Location: [facility];  Service: Neurosurgery;  Laterality: Right;  Right Lumbar Four-Five Discectomy   Family History  Problem Relation Age of Onset  . Cancer Sister     lung  . Diabetes Sister   . Cancer Brother     lung   History   Social History  . Marital Status: Married    Spouse Name: N/A    Number of Children: N/A  . Years of Education: N/A   Occupational History  . Not on file.   Social History Main Topics  . Smoking status: Former Smoker    Types: Cigarettes    Quit date: 04/02/2004  . Smokeless tobacco: Not on file  . Alcohol Use: 1.2 oz/week    2 Glasses of wine per week  . Drug Use: No  . Sexually Active: Not on file   Other Topics Concern  . Not on file   Social History Narrative  . No narrative on file    Current Outpatient Prescriptions on File Prior to Visit  Medication Sig Dispense Refill  . furosemide (LASIX) 40 MG tablet Take 1 tablet (40 mg total) by mouth daily.  90 tablet  3  . Misc Natural Products (OSTEO BI-FLEX JOINT SHIELD PO) Take 1 tablet by mouth 2 (two) times daily.      . MULTIPLE  VITAMIN PO Take 1 tablet by mouth daily.       Marland Kitchen omeprazole (PRILOSEC) 20 MG capsule Take 20 mg by mouth daily.       . simvastatin (ZOCOR) 80 MG tablet TAKE ONE TABLET BY MOUTH AT BEDTIME  30 tablet  11  . WELCHOL 625 MG tablet TAKE THREE TABLETS BY MOUTH TWICE DAILY WITH MEALS  180 each  3  . gabapentin (NEURONTIN) 100 MG capsule Take 1 capsule (100 mg total) by mouth 3 (three) times daily. 1 qd x 3, 1 bid x 3 then tid  90 capsule  3      Review of Systems System review is negative for any constitutional, cardiac, pulmonary, GI or neuro symptoms or complaints other than as described in the HPI.     Objective:   Physical Exam Filed Vitals:   11/08/11 1205  BP: 142/80  Pulse: 53  Temp: 97 F (36.1 C)  Resp: 16   Gen'l- WNWD white woman who is upset and does have pain during the exam Cor- RRR Pulm - normal respirations MSK - able to stand w/o assistance, step-up to exam. Nl SLR sitting. Nl DTRs  patellar tendon. Some pain with external rotation of the right hip, no pain with internal rotation. Tender to palpation of muscles over the sacrum, SI joint, Piriformis, vastus lateralis. Nl sensation. Walks w/o assistance.       Assessment & Plan:

## 2011-11-08 NOTE — Assessment & Plan Note (Addendum)
Intermittent episodes of excruciating pain in the right buttock right groin, thigh and at times radiation to the distal LE. Point tenderness near the right SI joint and low sacrum. No radicular symptoms. 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  Plan Tylenol (generic) 1,000 mg 4 times a day  Trial of celebrex 200 mg once a day - STOP if it hurts  Flexeril (generic) 5 mg two or three times a day.  Keep appointment with Dr. Murray Hodgkins  Referral to Integrative therapy for PT. Massage, etc. I have had very good results for patients with this group.

## 2011-11-08 NOTE — Patient Instructions (Addendum)
Pain in the Butt and leg. There is no evidence of a pinched nerve. It appears due to the areas of tenderness that this is a soft tissue injury and pain, i.e. Muscle tenderness when pushed.  Plan Tylenol (generic) 1,000 mg 4 times a day  Trial of celebrex 200 mg once a day - STOP if it hurts  Flexeril (generic) 5 mg two or three times a day.  Keep appointment with Dr. Murray Hodgkins  Referral to Integrative therapy for PT. Massage, etc. I have had very good results for patients with this group.

## 2011-11-12 ENCOUNTER — Telehealth: Payer: Self-pay | Admitting: Internal Medicine

## 2011-11-12 MED ORDER — CELECOXIB 200 MG PO CAPS: 200.0000 mg | ORAL_CAPSULE | Freq: Every day | ORAL | Status: DC

## 2011-11-12 NOTE — Telephone Encounter (Signed)
The pt called the triage line hoping to get a rx of Celebrex.  She stated she took the samples and tolerated the medicine.  She is hoping a rx can be called into the [facility] on [address].   Thanks!

## 2011-11-12 NOTE — Telephone Encounter (Signed)
Called pt back was not at home left msg with husband rx sent to [facility]... 11/12/11@11 :15am/LMB

## 2012-01-08 ENCOUNTER — Other Ambulatory Visit: Payer: Self-pay | Admitting: Internal Medicine

## 2012-05-03 ENCOUNTER — Other Ambulatory Visit: Payer: Self-pay | Admitting: Internal Medicine

## 2012-05-05 ENCOUNTER — Other Ambulatory Visit: Payer: Self-pay | Admitting: *Deleted

## 2012-05-05 MED ORDER — SIMVASTATIN 80 MG PO TABS: 80.0000 mg | ORAL_TABLET | Freq: Every day | ORAL | Status: DC

## 2012-05-05 MED ORDER — COLESEVELAM HCL 625 MG PO TABS: ORAL_TABLET | ORAL | Status: DC

## 2012-05-05 MED ORDER — COLESEVELAM HCL 625 MG PO TABS: 625.0000 mg | ORAL_TABLET | Freq: Three times a day (TID) | ORAL | Status: DC

## 2012-12-07 ENCOUNTER — Other Ambulatory Visit: Payer: Self-pay | Admitting: Internal Medicine

## 2014-01-12 ENCOUNTER — Ambulatory Visit: Payer: 59 | Admitting: Internal Medicine

## 2014-02-22 ENCOUNTER — Ambulatory Visit: Payer: 59 | Admitting: Internal Medicine

## 2014-03-12 ENCOUNTER — Ambulatory Visit (INDEPENDENT_AMBULATORY_CARE_PROVIDER_SITE_OTHER): Payer: Self-pay | Admitting: Internal Medicine

## 2014-03-12 ENCOUNTER — Encounter: Payer: Self-pay | Admitting: *Deleted

## 2014-03-12 ENCOUNTER — Encounter: Payer: Self-pay | Admitting: Internal Medicine

## 2014-03-12 VITALS — BP 112/54 | HR 61 | Ht 66.0 in | Wt 179.0 lb

## 2014-03-12 DIAGNOSIS — R002 Palpitations: Secondary | ICD-10-CM

## 2014-03-12 DIAGNOSIS — R001 Bradycardia, unspecified: Secondary | ICD-10-CM

## 2014-03-12 DIAGNOSIS — Z01812 Encounter for preprocedural laboratory examination: Secondary | ICD-10-CM

## 2014-03-12 DIAGNOSIS — G459 Transient cerebral ischemic attack, unspecified: Secondary | ICD-10-CM

## 2014-03-12 NOTE — Progress Notes (Signed)
ELECTROPHYSIOLOGY CONSULT NOTE  Patient ID: Tiffany Wiggins, MRN: [id number], DOB/AGE: 1945/10/25 68 y.o. Admit date: (Not on file) Date of Consult: 03/12/2014  Primary Physician: Adella Hare, MD Primary Cardiologist: new  Chief Complaint: Tiffany Wiggins   HPI Tiffany Wiggins is a 68 y.o. female  Seen to establish following her presentation a year ago with tachycardia.  Last fall she developed tachycardia palpitations. She awakened at night. She thought her heart rate was going about 200. She ended up at the emergency room. I called and spoke to the records people heart rate initially was recorded at 88. The first electrocardiogram was sinus rhythm. Not withstanding this, she understood that was atrial fibrillation. Outside records were reviewed which failed to clarify or support this.   Catheterization 8/15 demonstrated normal LV function and nonobstructive disease. Myoview 11/14 was normal;  no echocardiogram was done.  She has a history of a prior TIA and carotid disease. It was noted 50% by MRA.  She has a history of bradycardia that was asymptomatic with a nadir 45 during the day and 30  at night.       Past Medical History  Diagnosis Date  . Hyperglycemia   . Hyperlipidemia   . PUD (peptic ulcer disease)   . IBS (irritable bowel syndrome)   . Hypertension     PCP GAVE FOR HER  BLOOD PRESSURE  . Stroke     TIA  BACK IN  04/2004  . JOINOMVE(720.9)       Surgical History:  Past Surgical History  Procedure Laterality Date  . Tonsillectomy    . Laparoscopy for fertility work up    . Abdominal hysterectomy    . Oophorectomy    . Tonsillectomy    . Lumbar laminectomy/decompression microdiscectomy  04/25/2011    Procedure: LUMBAR LAMINECTOMY/DECOMPRESSION MICRODISCECTOMY;  Surgeon: [name], MD;  Location: [facility];  Service: Neurosurgery;  Laterality: Right;  Right Lumbar Four-Five Discectomy     Home Meds: Prior to Admission medications   Medication Sig  Start Date End Date Taking? Authorizing Provider  acetaminophen (TYLENOL) 500 MG tablet Take 500 mg by mouth 2 (two) times daily. Moderate pain...   Yes Historical Provider, MD  amLODipine (NORVASC) 10 MG tablet Take 10 mg by mouth daily. 03/06/14  Yes Historical Provider, MD  aspirin 325 MG tablet Take 325 mg by mouth daily.   Yes Historical Provider, MD  CELEBREX 200 MG capsule TAKE ONE CAPSULE BY MOUTH EVERY DAY 12/07/12  Yes Neena Rhymes, MD  Coenzyme Q10 (COQ10) 100 MG CAPS Take 1 tablet by mouth daily.   Yes Historical Provider, MD  hydrochlorothiazide (HYDRODIURIL) 25 MG tablet Take 25 mg by mouth daily. 01/06/14  Yes Historical Provider, MD  HYDROcodone-acetaminophen (NORCO) 7.5-325 MG per tablet Take 1 tablet by mouth as needed. For pain 12/25/13  Yes Historical Provider, MD  lisinopril (PRINIVIL,ZESTRIL) 40 MG tablet Take 40 mg by mouth daily. 03/06/14  Yes Historical Provider, MD  metFORMIN (GLUCOPHAGE) 500 MG tablet Take 500 mg by mouth daily. 12/16/13  Yes Historical Provider, MD  MULTIPLE VITAMIN PO Take 1 tablet by mouth daily.    Yes Historical Provider, MD  Multiple Vitamins-Minerals (VISION-VITE PRESERVE PO) Take 1 tablet by mouth 2 (two) times daily.   Yes Historical Provider, MD  omeprazole (PRILOSEC) 20 MG capsule Take 20 mg by mouth daily.    Yes Historical Provider, MD  pravastatin (PRAVACHOL) 80 MG tablet Take 80 mg by mouth daily. 01/26/14  Yes Historical Provider, MD  VOLTAREN 1 % GEL Apply 2 application topically as needed. Neck pain/shoulder pain 12/22/13  Yes Historical Provider, MD    Allergies:  Allergies  Allergen Reactions  . Codeine Nausea Only  . Morphine And Related     Itching     History   Social History  . Marital Status: Married    Spouse Name: N/A    Number of Children: N/A  . Years of Education: N/A   Occupational History  . Not on file.   Social History Main Topics  . Smoking status: Former Smoker    Types: Cigarettes    Quit date:  04/02/2004  . Smokeless tobacco: Not on file  . Alcohol Use: 1.2 oz/week    2 Glasses of wine per week  . Drug Use: No  . Sexual Activity: Not on file   Other Topics Concern  . Not on file   Social History Narrative     Family History  Problem Relation Age of Onset  . Cancer Sister     lung  . Diabetes Sister   . Cancer Brother     lung     ROS:  Please see the history of present illness.     All other systems reviewed and negative.    Physical Exam   Blood pressure 112/54, pulse 61, height 5\' 6"  (1.676 m), weight 179 lb (81.194 kg). General: Well developed, well nourished female in no acute distress. Head: Normocephalic, atraumatic, sclera non-icteric, no xanthomas, nares are without discharge. EENT: normal Lymph Nodes:  none Back: without scoliosis/kyphosis* *, no CVA tendersness Neck: Negative for carotid bruits. JVD not elevated. Lungs: Clear bilaterally to auscultation without wheezes, rales, or rhonchi. Breathing is unlabored. Heart: RRR with S1 S2. 2 /6 systolic murmur , rubs, or gallops appreciated. Abdomen: Soft, non-tender, non-distended with normoactive bowel sounds. No hepatomegaly. No rebound/guarding. No obvious abdominal masses. Msk:  Strength and tone appear normal for age. Extremities: No clubbing or cyanosis. No  edema.  Distal pedal pulses are 2+ and equal bilaterally. Skin: Warm and Dry Neuro: Alert and oriented X 3. CN III-XII intact Grossly normal sensory and motor function . Psych:  Responds to questions appropriately with a normal affect.      Labs: Cardiac Enzymes No results for input(s): CKTOTAL, CKMB, TROPONINI in the last 72 hours. CBC Lab Results  Component Value Date   WBC 9.7 09/10/2011   HGB 12.4 09/10/2011   HCT 36.9 09/10/2011   MCV 94.8 09/10/2011   PLT 192.0 09/10/2011   PROTIME: No results for input(s): LABPROT, INR in the last 72 hours. Chemistry No results for input(s): NA, K, CL, CO2, BUN, CREATININE, CALCIUM, PROT,  BILITOT, ALKPHOS, ALT, AST, GLUCOSE in the last 168 hours.  Invalid input(s): LABALBU Lipids Lab Results  Component Value Date   CHOL 226* 09/15/2009   HDL 57.60 09/15/2009   TRIG 342.0* 09/15/2009   BNP No results found for: PROBNP Miscellaneous No results found for: DDIMER  Radiology/Studies:  No results found.  EKG: ECG demonstrates sinus rhythm at 61 Intervals 14/08/42 Otherwise normal Assessment and Plan:  Tachypalpitations  Sinus bradycardia  TIA    The patient has had a monitor demonstrating sinus bradycardia; it was asymptomatic. She had presented however to the hospital with tachycardia palpitations and the records described "atrial fibrillation". I called and spoke to the hospital in [place]. Reviewing the records from the emergency room they demonstrated sinus rhythm on the first tracing and her heart  rate of 88. They described having had palpitations prior to arrival. No atrial fibrillation has been documented.  Her CHADS-VASc score is 6 if she in fact has atrial fibrillation; hence, it becomes incumbent upon [place] to make this diagnosis continue with her prior TIA. We have discussed the use of  implantable loop recorder to this end  Blood pressure is reasonably controlled.   Virl Axe   .skc

## 2014-03-12 NOTE — Patient Instructions (Addendum)
Your physician has recommended you make the following change in your medication:  1) STOP Hydrochlorothiazide 2) START Furosemide 20 mg daily  Your physician recommends that you return for pre-procedure  lab work on: 03/17/14 for BMET, CBCD  Your physician recommends you have a loop recorder implanted.  Please see letter of instructions given to you.   Your wound check is scheduled for 04/05/14 at 4:30 pm at [address].

## 2014-03-15 ENCOUNTER — Other Ambulatory Visit: Payer: Self-pay

## 2014-03-15 MED ORDER — FUROSEMIDE 20 MG PO TABS: 20.0000 mg | ORAL_TABLET | Freq: Every day | ORAL | Status: DC

## 2014-03-17 ENCOUNTER — Other Ambulatory Visit: Payer: Medicare Other

## 2014-03-19 ENCOUNTER — Other Ambulatory Visit (INDEPENDENT_AMBULATORY_CARE_PROVIDER_SITE_OTHER): Payer: Medicare Other | Admitting: *Deleted

## 2014-03-19 DIAGNOSIS — E785 Hyperlipidemia, unspecified: Secondary | ICD-10-CM

## 2014-03-19 LAB — CBC WITH DIFFERENTIAL/PLATELET
BASOS ABS: 0.1 10*3/uL (ref 0.0–0.1)
Basophils Relative: 0.6 % (ref 0.0–3.0)
EOS ABS: 0.1 10*3/uL (ref 0.0–0.7)
Eosinophils Relative: 1.4 % (ref 0.0–5.0)
HCT: 37.4 % (ref 36.0–46.0)
HEMOGLOBIN: 12.2 g/dL (ref 12.0–15.0)
LYMPHS ABS: 3 10*3/uL (ref 0.7–4.0)
LYMPHS PCT: 30.8 % (ref 12.0–46.0)
MCHC: 32.7 g/dL (ref 30.0–36.0)
MCV: 97.8 fl (ref 78.0–100.0)
Monocytes Absolute: 0.7 10*3/uL (ref 0.1–1.0)
Monocytes Relative: 7.1 % (ref 3.0–12.0)
NEUTROS ABS: 5.8 10*3/uL (ref 1.4–7.7)
Neutrophils Relative %: 60.1 % (ref 43.0–77.0)
Platelets: 235 10*3/uL (ref 150.0–400.0)
RBC: 3.82 Mil/uL — ABNORMAL LOW (ref 3.87–5.11)
RDW: 13.1 % (ref 11.5–15.5)
WBC: 9.7 10*3/uL (ref 4.0–10.5)

## 2014-03-19 LAB — BASIC METABOLIC PANEL
BUN: 20 mg/dL (ref 6–23)
CALCIUM: 9.8 mg/dL (ref 8.4–10.5)
CO2: 27 meq/L (ref 19–32)
CREATININE: 1 mg/dL (ref 0.4–1.2)
Chloride: 103 mEq/L (ref 96–112)
GFR: 62.36 mL/min (ref >60.00)
GLUCOSE: 83 mg/dL (ref 70–99)
Potassium: 4.3 mEq/L (ref 3.5–5.1)
SODIUM: 141 meq/L (ref 135–145)

## 2014-03-24 ENCOUNTER — Encounter (HOSPITAL_COMMUNITY): Payer: Self-pay | Admitting: Internal Medicine

## 2014-03-24 ENCOUNTER — Ambulatory Visit (HOSPITAL_COMMUNITY)
Admission: RE | Admit: 2014-03-24 | Discharge: 2014-03-24 | Disposition: A | Discharge status: Home / Self Care | Payer: Medicare Other | Source: Ambulatory Visit | Attending: Internal Medicine | Admitting: Internal Medicine

## 2014-03-24 ENCOUNTER — Encounter (HOSPITAL_COMMUNITY)
Admission: RE | Disposition: A | Discharge status: Home / Self Care | Payer: Self-pay | Source: Ambulatory Visit | Attending: Internal Medicine

## 2014-03-24 DIAGNOSIS — I6350 Cerebral infarction due to unspecified occlusion or stenosis of unspecified cerebral artery: Secondary | ICD-10-CM | POA: Diagnosis present

## 2014-03-24 DIAGNOSIS — R002 Palpitations: Secondary | ICD-10-CM | POA: Diagnosis present

## 2014-03-24 DIAGNOSIS — I639 Cerebral infarction, unspecified: Secondary | ICD-10-CM

## 2014-03-24 HISTORY — PX: LOOP RECORDER IMPLANT: SHX5477

## 2014-03-24 LAB — GLUCOSE, CAPILLARY
Glucose-Capillary: 140 mg/dL — ABNORMAL HIGH (ref 70–99)
Glucose-Capillary: 136 mg/dL — ABNORMAL HIGH (ref 70–99)

## 2014-03-24 SURGERY — LOOP RECORDER IMPLANT
Anesthesia: LOCAL

## 2014-03-24 NOTE — CV Procedure (Signed)
Pre op Dx Cryptogenic Stroke palpitations   Post op Dx same  Procedure  Loop Recorder implantation  After routine prep and drape of the left parasternal area, a small incision was created. A [employer]onic LINQ Reveal Loop Recorder  Serial Number  [id number] was inserted.    SteriStrip dressing was  applied.  The patient tolerated the procedure without apparent complication.

## 2014-03-24 NOTE — Discharge Instructions (Signed)
Cardiac Event Monitoring A cardiac event monitor is a small recording device used to help detect abnormal heart rhythms (arrhythmias). The monitor is used to record heart rhythm when noticeable symptoms such as the following occur:  Fast heartbeats (palpitations), such as heart racing or fluttering.  Dizziness.  Fainting or light-headedness.  Unexplained weakness. The monitor is wired to two electrodes placed on your chest. Electrodes are flat, sticky disks that attach to your skin. The monitor can be worn for up to 30 days. You will wear the monitor at all times, except when bathing.  HOW TO USE YOUR CARDIAC EVENT MONITOR A technician will prepare your chest for the electrode placement. The technician will show you how to place the electrodes, how to work the monitor, and how to replace the batteries. Take time to practice using the monitor before you leave the office. Make sure you understand how to send the information from the monitor to your health care provider. This requires a telephone with a landline, not a cell phone. You need to:  Wear your monitor at all times, except when you are in water:  Do not get the monitor wet.  Take the monitor off when bathing. Do not swim or use a hot tub with it on.  Keep your skin clean. Do not put body lotion or moisturizer on your chest.  Change the electrodes daily or any time they stop sticking to your skin. You might need to use tape to keep them on.  It is possible that your skin under the electrodes could become irritated. To keep this from happening, try to put the electrodes in slightly different places on your chest. However, they must remain in the area under your left breast and in the upper right section of your chest.  Make sure the monitor is safely clipped to your clothing or in a location close to your body that your health care provider recommends.  Press the button to record when you feel symptoms of heart trouble, such as  dizziness, weakness, light-headedness, palpitations, thumping, shortness of breath, unexplained weakness, or a fluttering or racing heart. The monitor is always on and records what happened slightly before you pressed the button, so do not worry about being too late to get good information.  Keep a diary of your activities, such as walking, doing chores, and taking medicine. It is especially important to note what you were doing when you pushed the button to record your symptoms. This will help your health care provider determine what might be contributing to your symptoms. The information stored in your monitor will be reviewed by your health care provider alongside your diary entries.  Send the recorded information as recommended by your health care provider. It is important to understand that it will take some time for your health care provider to process the results.  Change the batteries as recommended by your health care provider. SEEK IMMEDIATE MEDICAL CARE IF:   You have chest pain.  You have extreme difficulty breathing or shortness of breath.  You develop a very fast heartbeat that persists.  You develop dizziness that does not go away.  You faint or constantly feel you are about to faint. Document Released: 12/27/2007 Document Revised: 08/03/2013 Document Reviewed: 09/15/2012 [employer] Patient Information 2015 [employer]. This information is not intended to replace advice given to you by your health care provider. Make sure you discuss any questions you have with your health care provider.  

## 2014-03-24 NOTE — Progress Notes (Signed)
Pt s/p loop implant.  C/O having slight tremors to hands and legs.  B/P stable, CBG 136.  Pt states that she feels fine.  Ambulated without difficulty.   Encouraged to monitor and call for any changes.

## 2014-03-25 ENCOUNTER — Telehealth: Payer: Self-pay | Admitting: Internal Medicine

## 2014-03-25 ENCOUNTER — Ambulatory Visit (INDEPENDENT_AMBULATORY_CARE_PROVIDER_SITE_OTHER): Payer: Medicare Other | Admitting: *Deleted

## 2014-03-25 DIAGNOSIS — R002 Palpitations: Secondary | ICD-10-CM

## 2014-03-25 NOTE — Telephone Encounter (Signed)
New Msg     The steri-strips came off by accident, procedure was just completed yesterday. Pt is bleeding a little.      Pt is on the way to the office as instructed, traveling from [place], may take 40 min to get here if he needs to be reached please call cell phone [phone].

## 2014-03-25 NOTE — Progress Notes (Signed)
The patient was seen in clinic today per call concerned because when she took the dressing off from her ILR insertion yesterday she accidentally took off the steri stips also and the wound had some bleeding.  On inspection of the site today there was no active bleeding.  Steri strips were reapplied.  Follow up as scheduled.

## 2014-03-25 NOTE — Telephone Encounter (Signed)
Patient to be seen in the office today

## 2014-04-02 HISTORY — PX: BIOPSY THYROID: PRO38

## 2014-04-05 ENCOUNTER — Ambulatory Visit (INDEPENDENT_AMBULATORY_CARE_PROVIDER_SITE_OTHER): Payer: Medicare Other | Admitting: *Deleted

## 2014-04-05 DIAGNOSIS — I639 Cerebral infarction, unspecified: Secondary | ICD-10-CM

## 2014-04-05 LAB — MDC_IDC_ENUM_SESS_TYPE_INCLINIC

## 2014-04-05 NOTE — Progress Notes (Signed)
LINQ wound check in clinic. Battery status good. Normal device function. No episodes recorded. Monthly summary reports and ROV in December with [facility].

## 2014-04-13 DIAGNOSIS — M25512 Pain in left shoulder: Secondary | ICD-10-CM | POA: Diagnosis not present

## 2014-04-23 ENCOUNTER — Ambulatory Visit (INDEPENDENT_AMBULATORY_CARE_PROVIDER_SITE_OTHER): Payer: Medicare Other | Admitting: *Deleted

## 2014-04-23 DIAGNOSIS — I639 Cerebral infarction, unspecified: Secondary | ICD-10-CM | POA: Diagnosis not present

## 2014-04-29 ENCOUNTER — Encounter: Payer: Self-pay | Admitting: Internal Medicine

## 2014-04-29 ENCOUNTER — Other Ambulatory Visit (INDEPENDENT_AMBULATORY_CARE_PROVIDER_SITE_OTHER): Payer: Medicare Other

## 2014-04-29 ENCOUNTER — Ambulatory Visit (INDEPENDENT_AMBULATORY_CARE_PROVIDER_SITE_OTHER): Payer: Medicare Other | Admitting: Internal Medicine

## 2014-04-29 VITALS — BP 112/74 | HR 55 | Temp 98.0°F | Ht 66.0 in | Wt 178.5 lb

## 2014-04-29 DIAGNOSIS — E785 Hyperlipidemia, unspecified: Secondary | ICD-10-CM | POA: Diagnosis not present

## 2014-04-29 DIAGNOSIS — E041 Nontoxic single thyroid nodule: Secondary | ICD-10-CM

## 2014-04-29 DIAGNOSIS — I10 Essential (primary) hypertension: Secondary | ICD-10-CM

## 2014-04-29 DIAGNOSIS — E119 Type 2 diabetes mellitus without complications: Secondary | ICD-10-CM

## 2014-04-29 DIAGNOSIS — Z23 Encounter for immunization: Secondary | ICD-10-CM | POA: Diagnosis not present

## 2014-04-29 DIAGNOSIS — E118 Type 2 diabetes mellitus with unspecified complications: Secondary | ICD-10-CM

## 2014-04-29 DIAGNOSIS — M25559 Pain in unspecified hip: Secondary | ICD-10-CM

## 2014-04-29 LAB — LIPID PANEL
Cholesterol: 327 mg/dL — ABNORMAL HIGH (ref 0–200)
HDL: 55.1 mg/dL (ref >39.00)
Total CHOL/HDL Ratio: 6
Triglycerides: 471 mg/dL — ABNORMAL HIGH (ref 0.0–149.0)

## 2014-04-29 LAB — LDL CHOLESTEROL, DIRECT: LDL DIRECT: 233 mg/dL

## 2014-04-29 LAB — HEMOGLOBIN A1C: Hgb A1c MFr Bld: 6.6 % — ABNORMAL HIGH (ref 4.6–6.5)

## 2014-04-29 LAB — TSH: TSH: 1.15 u[IU]/mL (ref 0.35–4.50)

## 2014-04-29 LAB — T4, FREE: Free T4: 0.81 ng/dL (ref 0.60–1.60)

## 2014-04-29 MED ORDER — AMLODIPINE BESYLATE 10 MG PO TABS: 10.0000 mg | ORAL_TABLET | Freq: Every day | ORAL | Status: DC

## 2014-04-29 MED ORDER — ZOSTER VACCINE LIVE 19400 UNT/0.65ML ~~LOC~~ SOLR: 0.6500 mL | Freq: Once | SUBCUTANEOUS | Status: DC

## 2014-04-29 MED ORDER — OMEPRAZOLE 20 MG PO CPDR: 20.0000 mg | DELAYED_RELEASE_CAPSULE | Freq: Every day | ORAL | Status: DC

## 2014-04-29 MED ORDER — METFORMIN HCL 500 MG PO TABS: 500.0000 mg | ORAL_TABLET | Freq: Every day | ORAL | Status: DC

## 2014-04-29 MED ORDER — LISINOPRIL 40 MG PO TABS: 40.0000 mg | ORAL_TABLET | Freq: Every day | ORAL | Status: DC

## 2014-04-29 MED ORDER — PRAVASTATIN SODIUM 80 MG PO TABS
80.0000 mg | ORAL_TABLET | Freq: Every day | ORAL | Status: DC
Start: 2014-04-29 — End: 2014-07-22

## 2014-04-29 NOTE — Progress Notes (Signed)
Loop recorder 

## 2014-04-29 NOTE — Progress Notes (Signed)
Pre visit review using our clinic review tool, if applicable. No additional management support is needed unless otherwise documented below in the visit note. 

## 2014-04-29 NOTE — Patient Instructions (Signed)
We have given you the pneumonia booster shot and the flu shot. We will give you a prescription for the shingles shot which you can take to a pharmacy. You need to wait at least 2 weeks until you get this from today.  We have ordered your blood work and the ultrasound of the thyroid to check on that area the orthopedic doctor found.   We will see you back in about 6-12 months. Please feel free to call the office sooner if you have any problems or questions.   You are due to have a mammogram and a colon cancer screening. Try to get the mammogram sometime this year and we can talk about the colon cancer screening next year.   Health Maintenance Adopting a healthy lifestyle and getting preventive care can go a long way to promote health and wellness. Talk with your health care provider about what schedule of regular examinations is right for you. This is a good chance for you to check in with your provider about disease prevention and staying healthy. In between checkups, there are plenty of things you can do on your own. Experts have done a lot of research about which lifestyle changes and preventive measures are most likely to keep you healthy. Ask your health care provider for more information. WEIGHT AND DIET  Eat a healthy diet  Be sure to include plenty of vegetables, fruits, low-fat dairy products, and lean protein.  Do not eat a lot of foods high in solid fats, added sugars, or salt.  Get regular exercise. This is one of the most important things you can do for your health.  Most adults should exercise for at least 150 minutes each week. The exercise should increase your heart rate and make you sweat (moderate-intensity exercise).  Most adults should also do strengthening exercises at least twice a week. This is in addition to the moderate-intensity exercise.  Maintain a healthy weight  Body mass index (BMI) is a measurement that can be used to identify possible weight problems. It  estimates body fat based on height and weight. Your health care provider can help determine your BMI and help you achieve or maintain a healthy weight.  For females 27 years of age and older:   A BMI below 18.5 is considered underweight.  A BMI of 18.5 to 24.9 is normal.  A BMI of 25 to 29.9 is considered overweight.  A BMI of 30 and above is considered obese.  Watch levels of cholesterol and blood lipids  You should start having your blood tested for lipids and cholesterol at 67 years of age, then have this test every 5 years.  You may need to have your cholesterol levels checked more often if:  Your lipid or cholesterol levels are high.  You are older than 68 years of age.  You are at high risk for heart disease.  CANCER SCREENING   Lung Cancer  Lung cancer screening is recommended for adults 21-36 years old who are at high risk for lung cancer because of a history of smoking.  A yearly low-dose CT scan of the lungs is recommended for people who:  Currently smoke.  Have quit within the past 15 years.  Have at least a 30-pack-year history of smoking. A pack year is smoking an average of one pack of cigarettes a day for 1 year.  Yearly screening should continue until it has been 15 years since you quit.  Yearly screening should stop if you develop  a health problem that would prevent you from having lung cancer treatment.  Breast Cancer  Practice breast self-awareness. This means understanding how your breasts normally appear and feel.  It also means doing regular breast self-exams. Let your health care provider know about any changes, no matter how small.  If you are in your 20s or 30s, you should have a clinical breast exam (CBE) by a health care provider every 1-3 years as part of a regular health exam.  If you are 5 or older, have a CBE every year. Also consider having a breast X-ray (mammogram) every year.  If you have a family history of breast cancer, talk  to your health care provider about genetic screening.  If you are at high risk for breast cancer, talk to your health care provider about having an MRI and a mammogram every year.  Breast cancer gene (BRCA) assessment is recommended for women who have family members with BRCA-related cancers. BRCA-related cancers include:  Breast.  Ovarian.  Tubal.  Peritoneal cancers.  Results of the assessment will determine the need for genetic counseling and BRCA1 and BRCA2 testing. Cervical Cancer Routine pelvic examinations to screen for cervical cancer are no longer recommended for nonpregnant women who are considered low risk for cancer of the pelvic organs (ovaries, uterus, and vagina) and who do not have symptoms. A pelvic examination may be necessary if you have symptoms including those associated with pelvic infections. Ask your health care provider if a screening pelvic exam is right for you.   The Pap test is the screening test for cervical cancer for women who are considered at risk.  If you had a hysterectomy for a problem that was not cancer or a condition that could lead to cancer, then you no longer need Pap tests.  If you are older than 65 years, and you have had normal Pap tests for the past 10 years, you no longer need to have Pap tests.  If you have had past treatment for cervical cancer or a condition that could lead to cancer, you need Pap tests and screening for cancer for at least 20 years after your treatment.  If you no longer get a Pap test, assess your risk factors if they change (such as having a new sexual partner). This can affect whether you should start being screened again.  Some women have medical problems that increase their chance of getting cervical cancer. If this is the case for you, your health care provider may recommend more frequent screening and Pap tests.  The human papillomavirus (HPV) test is another test that may be used for cervical cancer screening.  The HPV test looks for the virus that can cause cell changes in the cervix. The cells collected during the Pap test can be tested for HPV.  The HPV test can be used to screen women 72 years of age and older. Getting tested for HPV can extend the interval between normal Pap tests from three to five years.  An HPV test also should be used to screen women of any age who have unclear Pap test results.  After 68 years of age, women should have HPV testing as often as Pap tests.  Colorectal Cancer  This type of cancer can be detected and often prevented.  Routine colorectal cancer screening usually begins at 68 years of age and continues through 68 years of age.  Your health care provider may recommend screening at an earlier age if you have risk  factors for colon cancer.  Your health care provider may also recommend using home test kits to check for hidden blood in the stool.  A small camera at the end of a tube can be used to examine your colon directly (sigmoidoscopy or colonoscopy). This is done to check for the earliest forms of colorectal cancer.  Routine screening usually begins at age 2.  Direct examination of the colon should be repeated every 5-10 years through 68 years of age. However, you may need to be screened more often if early forms of precancerous polyps or small growths are found. Skin Cancer  Check your skin from head to toe regularly.  Tell your health care provider about any new moles or changes in moles, especially if there is a change in a mole's shape or color.  Also tell your health care provider if you have a mole that is larger than the size of a pencil eraser.  Always use sunscreen. Apply sunscreen liberally and repeatedly throughout the day.  Protect yourself by wearing long sleeves, pants, a wide-brimmed hat, and sunglasses whenever you are outside. HEART DISEASE, DIABETES, AND HIGH BLOOD PRESSURE   Have your blood pressure checked at least every 1-2  years. High blood pressure causes heart disease and increases the risk of stroke.  If you are between 72 years and 74 years old, ask your health care provider if you should take aspirin to prevent strokes.  Have regular diabetes screenings. This involves taking a blood sample to check your fasting blood sugar level.  If you are at a normal weight and have a low risk for diabetes, have this test once every three years after 68 years of age.  If you are overweight and have a high risk for diabetes, consider being tested at a younger age or more often. PREVENTING INFECTION  Hepatitis B  If you have a higher risk for hepatitis B, you should be screened for this virus. You are considered at high risk for hepatitis B if:  You were born in a country where hepatitis B is common. Ask your health care provider which countries are considered high risk.  Your parents were born in a high-risk country, and you have not been immunized against hepatitis B (hepatitis B vaccine).  You have HIV or AIDS.  You use needles to inject street drugs.  You live with someone who has hepatitis B.  You have had sex with someone who has hepatitis B.  You get hemodialysis treatment.  You take certain medicines for conditions, including cancer, organ transplantation, and autoimmune conditions. Hepatitis C  Blood testing is recommended for:  Everyone born from [birth year] through 1964.  Anyone with known risk factors for hepatitis C. Sexually transmitted infections (STIs)  You should be screened for sexually transmitted infections (STIs) including gonorrhea and chlamydia if:  You are sexually active and are younger than 68 years of age.  You are older than 68 years of age and your health care provider tells you that you are at risk for this type of infection.  Your sexual activity has changed since you were last screened and you are at an increased risk for chlamydia or gonorrhea. Ask your health care provider if  you are at risk.  If you do not have HIV, but are at risk, it may be recommended that you take a prescription medicine daily to prevent HIV infection. This is called pre-exposure prophylaxis (PrEP). You are considered at risk if:  You are sexually active  and do not regularly use condoms or know the HIV status of your partner(s).  You take drugs by injection.  You are sexually active with a partner who has HIV. Talk with your health care provider about whether you are at high risk of being infected with HIV. If you choose to begin PrEP, you should first be tested for HIV. You should then be tested every 3 months for as long as you are taking PrEP.  PREGNANCY   If you are premenopausal and you may become pregnant, ask your health care provider about preconception counseling.  If you may become pregnant, take 400 to 800 micrograms (mcg) of folic acid every day.  If you want to prevent pregnancy, talk to your health care provider about birth control (contraception). OSTEOPOROSIS AND MENOPAUSE   Osteoporosis is a disease in which the bones lose minerals and strength with aging. This can result in serious bone fractures. Your risk for osteoporosis can be identified using a bone density scan.  If you are 60 years of age or older, or if you are at risk for osteoporosis and fractures, ask your health care provider if you should be screened.  Ask your health care provider whether you should take a calcium or vitamin D supplement to lower your risk for osteoporosis.  Menopause may have certain physical symptoms and risks.  Hormone replacement therapy may reduce some of these symptoms and risks. Talk to your health care provider about whether hormone replacement therapy is right for you.  HOME CARE INSTRUCTIONS   Schedule regular health, dental, and eye exams.  Stay current with your immunizations.   Do not use any tobacco products including cigarettes, chewing tobacco, or electronic  cigarettes.  If you are pregnant, do not drink alcohol.  If you are breastfeeding, limit how much and how often you drink alcohol.  Limit alcohol intake to no more than 1 drink per day for nonpregnant women. One drink equals 12 ounces of beer, 5 ounces of wine, or 1 ounces of hard liquor.  Do not use street drugs.  Do not share needles.  Ask your health care provider for help if you need support or information about quitting drugs.  Tell your health care provider if you often feel depressed.  Tell your health care provider if you have ever been abused or do not feel safe at home. Document Released: 10/02/2010 Document Revised: 08/03/2013 Document Reviewed: 02/18/2013 [facility] Patient Information 2015 [place], [place]. This information is not intended to replace advice given to you by your health care provider. Make sure you discuss any questions you have with your health care provider.

## 2014-04-30 ENCOUNTER — Ambulatory Visit
Admission: RE | Admit: 2014-04-30 | Discharge: 2014-04-30 | Disposition: A | Discharge status: Home / Self Care | Payer: Medicare Other | Source: Ambulatory Visit | Attending: Internal Medicine | Admitting: Internal Medicine

## 2014-04-30 ENCOUNTER — Other Ambulatory Visit: Payer: Self-pay | Admitting: Internal Medicine

## 2014-04-30 DIAGNOSIS — E041 Nontoxic single thyroid nodule: Secondary | ICD-10-CM | POA: Diagnosis not present

## 2014-04-30 DIAGNOSIS — E785 Hyperlipidemia, unspecified: Secondary | ICD-10-CM

## 2014-05-01 ENCOUNTER — Encounter: Payer: Self-pay | Admitting: Internal Medicine

## 2014-05-01 DIAGNOSIS — E041 Nontoxic single thyroid nodule: Secondary | ICD-10-CM | POA: Insufficient documentation

## 2014-05-01 NOTE — Assessment & Plan Note (Signed)
Doing well on norvasc, lasix, lisinopril. She does have diabetes and history of stroke so her goal is <130/<80. She is at goal. Check BMP and adjust regimen as needed.

## 2014-05-01 NOTE — Progress Notes (Signed)
   Subjective:    Patient ID: Tiffany Wiggins, female    DOB: 07-20-1945, 69 y.o.   MRN: [id number]  HPI The patient is a 69 YO female who is coming in today for follow up of a thyroid nodule found at orthopedic office on imaging. She denies associated tremors, constipation. They found the nodule within the last month and recommended [place] for imaging and characterization. She denies any dysphagia or breathing difficulty. She denies any pain in her neck. She has not noticed any lumps or growth of her neck. She is not having any new problems. She does have type 2 diabetes (well controlled on metformin, statin, ACE-I, complicated by hx stroke), hypertension (well controlled on lisinopril, norvasc, lasix), and hyperlipidemia (controlled on pravastatin, stable, no myalgias).   Review of Systems  Constitutional: Negative for fever, activity change, appetite change, fatigue and unexpected weight change.  HENT: Negative.   Eyes: Negative.   Respiratory: Negative for cough, chest tightness, shortness of breath and wheezing.   Cardiovascular: Negative for chest pain, palpitations and leg swelling.  Gastrointestinal: Negative for abdominal pain, diarrhea, constipation and abdominal distention.  Musculoskeletal: Positive for arthralgias. Negative for myalgias, back pain and gait problem.  Skin: Negative.   Neurological: Negative.   Psychiatric/Behavioral: Negative.       Objective:   Physical Exam  Constitutional: She is oriented to person, place, and time. She appears well-developed and well-nourished.  Overweight  HENT:  Head: Normocephalic and atraumatic.  Eyes: EOM are normal.  Neck: Normal range of motion.  Cardiovascular: Normal rate and regular rhythm.   Pulmonary/Chest: Effort normal and breath sounds normal. No respiratory distress. She has no wheezes. She has no rales.  Abdominal: Soft. Bowel sounds are normal. She exhibits no distension. There is no tenderness.  Musculoskeletal: She  exhibits no edema.  Neurological: She is alert and oriented to person, place, and time. Coordination normal.  Skin: Skin is warm and dry.  Psychiatric: She has a normal mood and affect. Her behavior is normal.   Filed Vitals:   04/29/14 1318  BP: 112/74  Pulse: 55  Temp: 98 F (36.7 C)  TempSrc: Oral  Height: 5\' 6"  (1.676 m)  Weight: 178 lb 8 oz (80.967 kg)  SpO2: 95%      Assessment & Plan:  Patient given prevnar and flu shot today. Rx for shingles shot given to patient and advised to get in 2-3 weeks.

## 2014-05-01 NOTE — Assessment & Plan Note (Signed)
Reviewed report from orthopedics and found 2 cm nodule that could not be characterized. Will get US thyroid and TSH and free T4 to see if clinically active. She may need biopsy depending on [place] as 2 cm is borderline for size to be biopsied.

## 2014-05-01 NOTE — Assessment & Plan Note (Addendum)
Check HgA1c today. Will do foot exam next time due to acuity of thyroid problem. On metformin and lisinopril and statin. BP at goal.

## 2014-05-01 NOTE — Assessment & Plan Note (Signed)
She uses celebrex as needed for pain.

## 2014-05-06 ENCOUNTER — Other Ambulatory Visit: Payer: Self-pay | Admitting: Internal Medicine

## 2014-05-06 DIAGNOSIS — E041 Nontoxic single thyroid nodule: Secondary | ICD-10-CM

## 2014-05-13 LAB — MDC_IDC_ENUM_SESS_TYPE_REMOTE

## 2014-05-20 ENCOUNTER — Ambulatory Visit
Admission: RE | Admit: 2014-05-20 | Discharge: 2014-05-20 | Disposition: A | Discharge status: Home / Self Care | Payer: Medicare Other | Source: Ambulatory Visit | Attending: Internal Medicine | Admitting: Internal Medicine

## 2014-05-20 ENCOUNTER — Other Ambulatory Visit (HOSPITAL_COMMUNITY)
Admission: RE | Admit: 2014-05-20 | Discharge: 2014-05-20 | Disposition: A | Discharge status: Home / Self Care | Payer: Medicare Other | Source: Ambulatory Visit | Attending: Interventional Radiology | Admitting: Interventional Radiology

## 2014-05-20 DIAGNOSIS — E041 Nontoxic single thyroid nodule: Secondary | ICD-10-CM | POA: Insufficient documentation

## 2014-05-24 ENCOUNTER — Ambulatory Visit (INDEPENDENT_AMBULATORY_CARE_PROVIDER_SITE_OTHER): Payer: Medicare Other | Admitting: *Deleted

## 2014-05-24 DIAGNOSIS — I639 Cerebral infarction, unspecified: Secondary | ICD-10-CM

## 2014-05-24 LAB — MDC_IDC_ENUM_SESS_TYPE_REMOTE

## 2014-05-25 ENCOUNTER — Telehealth: Payer: Self-pay | Admitting: Internal Medicine

## 2014-05-25 NOTE — Telephone Encounter (Signed)
I called patient to inform her of the test results and she wanted me to ask you if it is normal to experience pain after the test. The next day she said her muscle was sore and the pain ran down under her arm and into her right breast. Please advise, thanks.

## 2014-05-25 NOTE — Telephone Encounter (Signed)
Patient would like to know the results of the Thyroid test she took on 05/20/14.

## 2014-05-25 NOTE — Telephone Encounter (Signed)
Please call and let her know that we just got results back. She had a benign thyroid nodule which means they did not see any signs of cancerous cells. We will just watch with ultrasound to see if it is growing every 6-12 months.

## 2014-05-25 NOTE — Telephone Encounter (Signed)
It can be normal to have pain right after the procedure for a day or two. If she is still having the pain I would recommend coming in for a visit.

## 2014-05-25 NOTE — Progress Notes (Signed)
Loop recorder 

## 2014-05-25 NOTE — Telephone Encounter (Signed)
See previous note

## 2014-05-27 NOTE — Telephone Encounter (Signed)
Spoke with patient. She will schedule an office visit if she is still experiencing tenderness next week.

## 2014-06-18 ENCOUNTER — Encounter: Payer: Self-pay | Admitting: Internal Medicine

## 2014-06-23 ENCOUNTER — Ambulatory Visit (INDEPENDENT_AMBULATORY_CARE_PROVIDER_SITE_OTHER): Payer: Medicare Other | Admitting: *Deleted

## 2014-06-23 DIAGNOSIS — I639 Cerebral infarction, unspecified: Secondary | ICD-10-CM | POA: Diagnosis not present

## 2014-06-23 LAB — MDC_IDC_ENUM_SESS_TYPE_REMOTE

## 2014-06-29 NOTE — Progress Notes (Signed)
Loop recorder 

## 2014-07-05 ENCOUNTER — Encounter: Payer: Self-pay | Admitting: Internal Medicine

## 2014-07-22 ENCOUNTER — Encounter: Payer: Self-pay | Admitting: Internal Medicine

## 2014-07-22 ENCOUNTER — Ambulatory Visit (INDEPENDENT_AMBULATORY_CARE_PROVIDER_SITE_OTHER)
Admission: RE | Admit: 2014-07-22 | Discharge: 2014-07-22 | Disposition: A | Discharge status: Home / Self Care | Payer: Medicare Other | Source: Ambulatory Visit | Attending: Internal Medicine | Admitting: Internal Medicine

## 2014-07-22 ENCOUNTER — Ambulatory Visit (INDEPENDENT_AMBULATORY_CARE_PROVIDER_SITE_OTHER): Payer: Medicare Other | Admitting: Internal Medicine

## 2014-07-22 VITALS — BP 100/62 | HR 56 | Temp 97.7°F | Wt 183.0 lb

## 2014-07-22 DIAGNOSIS — M1612 Unilateral primary osteoarthritis, left hip: Secondary | ICD-10-CM | POA: Diagnosis not present

## 2014-07-22 DIAGNOSIS — N644 Mastodynia: Secondary | ICD-10-CM

## 2014-07-22 DIAGNOSIS — M25552 Pain in left hip: Secondary | ICD-10-CM | POA: Diagnosis not present

## 2014-07-22 DIAGNOSIS — E785 Hyperlipidemia, unspecified: Secondary | ICD-10-CM | POA: Diagnosis not present

## 2014-07-22 MED ORDER — ROSUVASTATIN CALCIUM 20 MG PO TABS: 20.0000 mg | ORAL_TABLET | Freq: Every day | ORAL | Status: DC

## 2014-07-22 NOTE — Patient Instructions (Signed)
We will have you get an x-ray of the hip downstairs today. You will also get the mammogram of the right breast done and they will call you to schedule that.   We have sent in the cholesterol medicine called crestor (rosuvastatin) that should not cause muscle cramps. Take 1 pill a day and if you have any problems call the office.

## 2014-07-22 NOTE — Assessment & Plan Note (Signed)
She refuses to get mammogram due to the loop recorder in her left chest wall and concern for pain. Will get diagnostic mammogram fro pulling pain in her right breast. No lumps or skin changes.

## 2014-07-22 NOTE — Assessment & Plan Note (Signed)
In severe exacerbation with last LDL way above goal. Will start crestor 20 mg daily and recheck lipid panel and LFTs in 3-6 months.

## 2014-07-22 NOTE — Progress Notes (Signed)
Patient received education resource, including the self-management goal and tool. Patient verbalized understanding. 

## 2014-07-22 NOTE — Progress Notes (Signed)
   Subjective:    Patient ID: Tiffany Wiggins, female    DOB: 04-03-1945, 69 y.o.   MRN: [id number]  HPI The patient is a 69 YO female who is coming in to follow up on her cholesterol. Her labs were very high at last reading and she forgot to mention that she had been off her medicine for cholesterol for months since it was giving her muscle cramps. She wonders if she can try something else for the cholesterol. She denies muscle cramps now that she is off it. She did not think there was much point in rechecking until she was on a medicine. Also is still having some muscle pulling pains in her right breast and does not want mammogram because she has the loop recorder in her left and thinks it will be painful.    Review of Systems  Constitutional: Negative for fever, activity change, appetite change, fatigue and unexpected weight change.  HENT: Negative.   Eyes: Negative.   Respiratory: Negative for cough, chest tightness, shortness of breath and wheezing.   Cardiovascular: Negative for chest pain, palpitations and leg swelling.  Gastrointestinal: Negative for abdominal pain, diarrhea, constipation and abdominal distention.  Musculoskeletal: Positive for arthralgias. Negative for myalgias, back pain and gait problem.  Skin: Negative.   Neurological: Negative.   Psychiatric/Behavioral: Negative.       Objective:   Physical Exam  Constitutional: She is oriented to person, place, and time. She appears well-developed and well-nourished.  Overweight  HENT:  Head: Normocephalic and atraumatic.  Eyes: EOM are normal.  Neck: Normal range of motion.  Cardiovascular: Normal rate and regular rhythm.   Pulmonary/Chest: Effort normal and breath sounds normal. No respiratory distress. She has no wheezes. She has no rales.  Abdominal: Soft. Bowel sounds are normal. She exhibits no distension. There is no tenderness.  Musculoskeletal: She exhibits no edema.  Neurological: She is alert and oriented to  person, place, and time. Coordination normal.  Skin: Skin is warm and dry.  Psychiatric: She has a normal mood and affect. Her behavior is normal.   Filed Vitals:   07/22/14 1521  BP: 100/62  Pulse: 56  Temp: 97.7 F (36.5 C)  Weight: 183 lb (83.008 kg)  SpO2: 97%      Assessment & Plan:

## 2014-07-23 ENCOUNTER — Ambulatory Visit (INDEPENDENT_AMBULATORY_CARE_PROVIDER_SITE_OTHER): Payer: Medicare Other | Admitting: *Deleted

## 2014-07-23 DIAGNOSIS — I639 Cerebral infarction, unspecified: Secondary | ICD-10-CM

## 2014-07-28 ENCOUNTER — Telehealth: Payer: Self-pay | Admitting: Internal Medicine

## 2014-07-28 ENCOUNTER — Other Ambulatory Visit: Payer: Self-pay | Admitting: Geriatric Medicine

## 2014-07-28 MED ORDER — METFORMIN HCL 500 MG PO TABS: 500.0000 mg | ORAL_TABLET | Freq: Every day | ORAL | Status: DC

## 2014-07-28 NOTE — Telephone Encounter (Signed)
Patient needs 90 day refill for metFORMIN (GLUCOPHAGE) 500 MG tablet [[id number]. [facility] [address] in [place]

## 2014-07-28 NOTE — Telephone Encounter (Signed)
Sent to pharmacy 

## 2014-07-28 NOTE — Progress Notes (Signed)
Loop recorder 

## 2014-08-03 ENCOUNTER — Encounter: Payer: Self-pay | Admitting: Internal Medicine

## 2014-08-10 ENCOUNTER — Ambulatory Visit (INDEPENDENT_AMBULATORY_CARE_PROVIDER_SITE_OTHER): Payer: Medicare Other | Admitting: Family Medicine

## 2014-08-10 ENCOUNTER — Encounter: Payer: Self-pay | Admitting: Family Medicine

## 2014-08-10 VITALS — BP 116/68 | HR 53 | Ht 66.0 in | Wt 182.0 lb

## 2014-08-10 DIAGNOSIS — M1990 Unspecified osteoarthritis, unspecified site: Secondary | ICD-10-CM | POA: Diagnosis not present

## 2014-08-10 DIAGNOSIS — M1610 Unilateral primary osteoarthritis, unspecified hip: Secondary | ICD-10-CM

## 2014-08-10 NOTE — Patient Instructions (Signed)
Good to meet you Ice 20 minutes 2 times daily. Usually after activity and before bed. Exercises 3 times a week.  Take tylenol 500 mg three times a day is the best evidence based medicine we have for arthritis.  Glucosamine sulfate 1500mg  twice a day is a supplement that has been shown to help moderate to severe arthritis. Vitamin D 2000 IU daily Tumeric 500mg  twice daily.  Capsaicin topically up to four times a day may also help with pain. It's important that you continue to stay active. Controlling your weight is important.  Consider physical therapy to strengthen muscles around the joint that hurts to take pressure off of the joint itself. Shoe inserts with good arch support may be helpful.  [employer] orthotics at [employer] [facility] could help.  Water aerobics and cycling with low resistance are the best two types of exercise for arthritis. Come back and see me in 4 weeks.

## 2014-08-10 NOTE — Assessment & Plan Note (Signed)
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 Patient is having some mild impingement from time to time but elected to try conservative therapy. We discussed Tylenol on a regular basis, over-the-counter natural supplementation, home exercises and patient work with Product/process development scientist today. We discussed icing regimen as well as proper shoe wear. Patient and will come back and see me again in 3-4 weeks for further evaluation and treatment.

## 2014-08-10 NOTE — Progress Notes (Signed)
Pre visit review using our clinic review tool, if applicable. No additional management support is needed unless otherwise documented below in the visit note. 

## 2014-08-10 NOTE — Progress Notes (Signed)
[facility] [place] [place], [place] [address] Phone: [phone] Subjective:    I'm seeing this patient by the request  of:  Tiffany Millers, MD   CC: Left hip pain  [id number] Tiffany Wiggins is a 69 y.o. female coming in with complaint of left hip pain. Patient has been seen by another provider and did have x-rays. Patient's x-rays were reviewed by me and patient does have moderate osteophytic changes. Patient states she continues to have a intermittent pain in the groin area on the left side. Patient describes it as more of a dull throbbing aching pain that can be sharp with certain activities. Nothing that stops her from activity but makes her very concerned about going up or downstairs a regular basis. Patient rates his pain is 8 out of 10 when it occurs. Patient though can have days when she is pain-free. Denies any radiation of pain from her back. Denies any nighttime awakening. Patient elected try to avoid other potential prescription medications if possible.     Past Medical History  Diagnosis Date  . Hyperglycemia   . Hyperlipidemia   . PUD (peptic ulcer disease)   . IBS (irritable bowel syndrome)   . Hypertension     PCP GAVE FOR HER  BLOOD PRESSURE  . Stroke     TIA  BACK IN  04/2004  . UXNATFTD(322.0)    Past Surgical History  Procedure Laterality Date  . Tonsillectomy    . Laparoscopy for fertility work up    . Abdominal hysterectomy    . Oophorectomy    . Tonsillectomy    . Lumbar laminectomy/decompression microdiscectomy  04/25/2011    Procedure: LUMBAR LAMINECTOMY/DECOMPRESSION MICRODISCECTOMY;  Surgeon: [name], MD;  Location: [facility];  Service: Neurosurgery;  Laterality: Right;  Right Lumbar Four-Five Discectomy  . Loop recorder implant N/A 03/24/2014    Procedure: LOOP RECORDER IMPLANT;  Surgeon: Deboraha Sprang, MD;  Location: [facility] CATH [facility];  Service: Cardiovascular;  Laterality: N/A;   History  Substance  Use Topics  . Smoking status: Former Smoker    Types: Cigarettes    Quit date: 04/02/2004  . Smokeless tobacco: Not on file  . Alcohol Use: 1.2 oz/week    2 Glasses of wine per week   Allergies  Allergen Reactions  . Codeine Nausea Only  . Morphine And Related     Itching    Current Outpatient Prescriptions on File Prior to Visit  Medication Sig Dispense Refill  . acetaminophen (TYLENOL) 500 MG tablet Take 500 mg by mouth 2 (two) times daily. Moderate pain...    . amLODipine (NORVASC) 10 MG tablet Take 1 tablet (10 mg total) by mouth daily. 90 tablet 3  . aspirin 325 MG tablet Take 325 mg by mouth daily.    . CELEBREX 200 MG capsule TAKE ONE CAPSULE BY MOUTH EVERY DAY 30 capsule 0  . Coenzyme Q10 (COQ10) 100 MG CAPS Take 1 tablet by mouth daily.    . furosemide (LASIX) 20 MG tablet Take 1 tablet (20 mg total) by mouth daily. 90 tablet 3  . lisinopril (PRINIVIL,ZESTRIL) 40 MG tablet Take 1 tablet (40 mg total) by mouth daily. 90 tablet 3  . metFORMIN (GLUCOPHAGE) 500 MG tablet Take 1 tablet (500 mg total) by mouth at bedtime. At bedtime 90 tablet 3  . MULTIPLE VITAMIN PO Take 1 tablet by mouth daily.     . Multiple Vitamins-Minerals (VISION-VITE PRESERVE PO) Take  1 tablet by mouth 2 (two) times daily.    Marland Kitchen omeprazole (PRILOSEC) 20 MG capsule Take 1 capsule (20 mg total) by mouth daily. 90 capsule 3  . rosuvastatin (CRESTOR) 20 MG tablet Take 1 tablet (20 mg total) by mouth daily. 90 tablet 3   No current facility-administered medications on file prior to visit.   Family History  Problem Relation Age of Onset  . Cancer Sister     lung  . Diabetes Sister   . Cancer Brother     lung     Past medical history, social, surgical and family history all reviewed in electronic medical record.   Review of Systems: No headache, visual changes, nausea, vomiting, diarrhea, constipation, dizziness, abdominal pain, skin rash, fevers, chills, night sweats, weight loss, swollen lymph nodes,  body aches, joint swelling, muscle aches, chest pain, shortness of breath, mood changes.   Objective Blood pressure 116/68, pulse 53, weight 182 lb (82.555 kg), SpO2 95 %.  General: No apparent distress alert and oriented x3 mood and affect normal, dressed appropriately.  HEENT: Pupils equal, extraocular movements intact  Respiratory: Patient's speak in full sentences and does not appear short of breath  Cardiovascular: No lower extremity edema, non tender, no erythema  Skin: Warm dry intact with no signs of infection or rash on extremities or on axial skeleton.  Abdomen: Soft nontender  Neuro: Cranial nerves II through XII are intact, neurovascularly intact in all extremities with 2+ DTRs and 2+ pulses.  Lymph: No lymphadenopathy of posterior or anterior cervical chain or axillae bilaterally.  Gait normal with good balance and coordination.  MSK:  Non tender with full range of motion and good stability and symmetric strength and tone of shoulders, elbows, wrist,  knee and ankles bilaterally.  Hip: Left ROM IR: 15 Deg with mild discomfort with 20 of internal rotation on the contralateral side, ER: 45 Deg, Flexion: 120 Deg, Extension: 100 Deg, Abduction: 45 Deg, Adduction: 45 Deg Strength IR: 5/5, ER: 5/5, Flexion: 5/5, Extension: 5/5, Abduction: 4/5, Adduction: 4/5 Pelvic alignment unremarkable to inspection and palpation. Standing hip rotation and gait without trendelenburg sign / unsteadiness. Greater trochanter with minimal tenderness bilaterally No tenderness over piriformis and greater trochanter. Positive pain with internal rotation No SI joint tenderness and normal minimal SI movement. Contralateral hip unremarkable  Procedure note 97110; 15 minutes spent for Therapeutic exercises as stated in above notes.  This included exercises focusing on stretching, strengthening, with significant focus on eccentric aspects. Patient given exercises to strengthen the abductor muscle group as  well as the gluteal muscle groups. Range of motion exercises given. Discussed nonweightbearing exercises.  Proper technique shown and discussed handout in great detail with ATC.  All questions were discussed and answered.     Impression and Recommendations:     This case required medical decision making of moderate complexity.

## 2014-08-19 LAB — CUP PACEART REMOTE DEVICE CHECK: MDC IDC SESS DTM: 20160519135225

## 2014-08-23 ENCOUNTER — Ambulatory Visit (INDEPENDENT_AMBULATORY_CARE_PROVIDER_SITE_OTHER): Payer: Medicare Other | Admitting: *Deleted

## 2014-08-23 DIAGNOSIS — I639 Cerebral infarction, unspecified: Secondary | ICD-10-CM

## 2014-08-24 NOTE — Progress Notes (Signed)
Loop recorder 

## 2014-09-03 LAB — CUP PACEART REMOTE DEVICE CHECK: MDC IDC SESS DTM: 20160603123144

## 2014-09-08 ENCOUNTER — Ambulatory Visit: Payer: Medicare Other | Admitting: Family Medicine

## 2014-09-21 ENCOUNTER — Ambulatory Visit (INDEPENDENT_AMBULATORY_CARE_PROVIDER_SITE_OTHER): Payer: Medicare Other | Admitting: *Deleted

## 2014-09-21 DIAGNOSIS — I639 Cerebral infarction, unspecified: Secondary | ICD-10-CM

## 2014-09-22 NOTE — Progress Notes (Signed)
Loop recorder 

## 2014-09-28 LAB — CUP PACEART REMOTE DEVICE CHECK: Date Time Interrogation Session: 20160628102113

## 2014-10-14 ENCOUNTER — Encounter: Payer: Self-pay | Admitting: Internal Medicine

## 2014-10-21 ENCOUNTER — Ambulatory Visit (INDEPENDENT_AMBULATORY_CARE_PROVIDER_SITE_OTHER): Payer: Medicare Other

## 2014-10-21 DIAGNOSIS — I639 Cerebral infarction, unspecified: Secondary | ICD-10-CM | POA: Diagnosis not present

## 2014-10-26 ENCOUNTER — Encounter: Payer: Self-pay | Admitting: Internal Medicine

## 2014-11-03 NOTE — Progress Notes (Signed)
Loop recorder 

## 2014-11-04 ENCOUNTER — Telehealth: Payer: Self-pay | Admitting: Internal Medicine

## 2014-11-04 MED ORDER — CELECOXIB 200 MG PO CAPS: 200.0000 mg | ORAL_CAPSULE | Freq: Every day | ORAL | Status: DC

## 2014-11-04 NOTE — Telephone Encounter (Signed)
Pt made aware

## 2014-11-04 NOTE — Telephone Encounter (Signed)
Sent in rx please call

## 2014-11-04 NOTE — Telephone Encounter (Signed)
Patient called and she has been doing her exercises and she is in pain. She remembered that she took Celebrex a couple years ago and that it worked well for her.  She is wondering if she could try that again.  Pharmacy is [facility] in [place], [place] Please advise

## 2014-11-12 LAB — CUP PACEART REMOTE DEVICE CHECK: MDC IDC SESS DTM: 20160812155259

## 2014-11-16 ENCOUNTER — Encounter: Payer: Self-pay | Admitting: Internal Medicine

## 2014-11-18 ENCOUNTER — Other Ambulatory Visit: Payer: Self-pay | Admitting: Internal Medicine

## 2014-11-18 LAB — CUP PACEART REMOTE DEVICE CHECK: Date Time Interrogation Session: 20160829131534

## 2014-11-19 ENCOUNTER — Ambulatory Visit (INDEPENDENT_AMBULATORY_CARE_PROVIDER_SITE_OTHER): Payer: Medicare Other | Admitting: *Deleted

## 2014-11-19 DIAGNOSIS — I639 Cerebral infarction, unspecified: Secondary | ICD-10-CM

## 2014-11-24 NOTE — Progress Notes (Signed)
Loop recorder 

## 2014-11-29 LAB — CUP PACEART REMOTE DEVICE CHECK: MDC IDC SESS DTM: 20160829131433

## 2014-12-09 ENCOUNTER — Telehealth: Payer: Self-pay | Admitting: Internal Medicine

## 2014-12-09 NOTE — Telephone Encounter (Signed)
Correction to note below:  Not Celebrex but Crestor- she doesn't want to take Crestor anymore

## 2014-12-09 NOTE — Telephone Encounter (Signed)
Needs fasting lipid panel on the crestor. Please have her come for the labs. She was changed due to muscle pains on the simvastatin. We did not discuss that at visit.

## 2014-12-09 NOTE — Telephone Encounter (Signed)
Patient is requesting to change from Celebrex back to simvastatin (ZOCOR) 80 MG tablet [[id number]] DISCONTINUED  She was told at her visit that won't be a problem Pharmacy is [facility] in [place]

## 2014-12-10 NOTE — Telephone Encounter (Signed)
Patient will come in on Monday to have lipid panel drawn.

## 2014-12-10 NOTE — Telephone Encounter (Signed)
Left message for patient to call me back to inform her that she needs to come in for fasting lipid panel

## 2014-12-13 ENCOUNTER — Other Ambulatory Visit (INDEPENDENT_AMBULATORY_CARE_PROVIDER_SITE_OTHER): Payer: Medicare Other

## 2014-12-13 DIAGNOSIS — E785 Hyperlipidemia, unspecified: Secondary | ICD-10-CM | POA: Diagnosis not present

## 2014-12-13 LAB — LIPID PANEL
CHOLESTEROL: 226 mg/dL — AB (ref 0–200)
HDL: 52.8 mg/dL (ref >39.00)
NonHDL: 173.21
Total CHOL/HDL Ratio: 4
Triglycerides: 225 mg/dL — ABNORMAL HIGH (ref 0.0–149.0)
VLDL: 45 mg/dL — AB (ref 0.0–40.0)

## 2014-12-13 LAB — LDL CHOLESTEROL, DIRECT: LDL DIRECT: 139 mg/dL

## 2014-12-20 ENCOUNTER — Ambulatory Visit (INDEPENDENT_AMBULATORY_CARE_PROVIDER_SITE_OTHER): Payer: Medicare Other | Admitting: *Deleted

## 2014-12-20 DIAGNOSIS — I639 Cerebral infarction, unspecified: Secondary | ICD-10-CM | POA: Diagnosis not present

## 2014-12-21 ENCOUNTER — Other Ambulatory Visit (INDEPENDENT_AMBULATORY_CARE_PROVIDER_SITE_OTHER): Payer: Medicare Other

## 2014-12-21 ENCOUNTER — Ambulatory Visit (INDEPENDENT_AMBULATORY_CARE_PROVIDER_SITE_OTHER): Payer: Medicare Other | Admitting: Internal Medicine

## 2014-12-21 ENCOUNTER — Encounter: Payer: Self-pay | Admitting: Internal Medicine

## 2014-12-21 VITALS — BP 132/70 | HR 51 | Temp 97.8°F | Resp 16 | Ht 66.0 in | Wt 185.8 lb

## 2014-12-21 DIAGNOSIS — I872 Venous insufficiency (chronic) (peripheral): Secondary | ICD-10-CM | POA: Diagnosis not present

## 2014-12-21 DIAGNOSIS — E785 Hyperlipidemia, unspecified: Secondary | ICD-10-CM

## 2014-12-21 DIAGNOSIS — E119 Type 2 diabetes mellitus without complications: Secondary | ICD-10-CM | POA: Diagnosis not present

## 2014-12-21 DIAGNOSIS — E118 Type 2 diabetes mellitus with unspecified complications: Secondary | ICD-10-CM

## 2014-12-21 LAB — BRAIN NATRIURETIC PEPTIDE: Pro B Natriuretic peptide (BNP): 91 pg/mL (ref 0.0–100.0)

## 2014-12-21 LAB — COMPREHENSIVE METABOLIC PANEL
ALK PHOS: 49 U/L (ref 39–117)
ALT: 11 U/L (ref 0–35)
AST: 12 U/L (ref 0–37)
Albumin: 4 g/dL (ref 3.5–5.2)
BUN: 39 mg/dL — AB (ref 6–23)
CO2: 28 meq/L (ref 19–32)
Calcium: 9.5 mg/dL (ref 8.4–10.5)
Chloride: 107 mEq/L (ref 96–112)
Creatinine, Ser: 1.36 mg/dL — ABNORMAL HIGH (ref 0.40–1.20)
GFR: 41.13 mL/min — ABNORMAL LOW (ref >60.00)
GLUCOSE: 175 mg/dL — AB (ref 70–99)
POTASSIUM: 5 meq/L (ref 3.5–5.1)
SODIUM: 143 meq/L (ref 135–145)
TOTAL PROTEIN: 6.6 g/dL (ref 6.0–8.3)
Total Bilirubin: 0.3 mg/dL (ref 0.2–1.2)

## 2014-12-21 LAB — HEMOGLOBIN A1C: Hgb A1c MFr Bld: 6.5 % (ref 4.6–6.5)

## 2014-12-21 MED ORDER — SIMVASTATIN 40 MG PO TABS: 40.0000 mg | ORAL_TABLET | Freq: Every day | ORAL | Status: DC

## 2014-12-21 NOTE — Assessment & Plan Note (Signed)
Cholesterol much improved on the crestor. Will switch to simvastatin as she had less problems with this medicine and cost is more reasonable. Will bring back for lipid panel in 6 months.

## 2014-12-21 NOTE — Progress Notes (Signed)
   Subjective:    Patient ID: Tiffany Wiggins, female    DOB: [birth year], 69 y.o.   MRN: [id number]  HPI The patient is a 69 YO female coming in for follow up of her cholesterol. She had switched to the crestor about 6 months ago and was doing okay with it. Then started having some leg cramps at night time. Got her cholesterol checked a week or so ago and was doing much better. Wants to go back on the simvastatin she had been taking in the past without problem plus it is cheaper. Additionally her husband is concerned about the swelling in her legs which has never been checked out. They swell everyday and the lasix that she takes does not help. She does urinate a lot with the lasix. Both swell the same. Not immobile and no recent travel. Has been present for years.   Review of Systems  Constitutional: Negative for fever, activity change, appetite change, fatigue and unexpected weight change.  HENT: Negative.   Eyes: Negative.   Respiratory: Negative for cough, chest tightness, shortness of breath and wheezing.   Cardiovascular: Negative for chest pain, palpitations and leg swelling.  Gastrointestinal: Negative for abdominal pain, diarrhea, constipation and abdominal distention.  Musculoskeletal: Positive for myalgias and arthralgias. Negative for back pain and gait problem.  Skin: Negative.   Neurological: Negative.   Psychiatric/Behavioral: Negative.       Objective:   Physical Exam  Constitutional: She is oriented to person, place, and time. She appears well-developed and well-nourished.  Overweight  HENT:  Head: Normocephalic and atraumatic.  Eyes: EOM are normal.  Neck: Normal range of motion.  Cardiovascular: Normal rate and regular rhythm.   Pulmonary/Chest: Effort normal and breath sounds normal. No respiratory distress. She has no wheezes. She has no rales.  Abdominal: Soft. Bowel sounds are normal. She exhibits no distension. There is no tenderness.  Musculoskeletal: She  exhibits edema.  Bilateral edema 1-2+ to mid shin  Neurological: She is alert and oriented to person, place, and time. Coordination normal.  Skin: Skin is warm and dry.  Psychiatric: She has a normal mood and affect. Her behavior is normal.   Filed Vitals:   12/21/14 1341  BP: 132/70  Pulse: 51  Temp: 97.8 F (36.6 C)  TempSrc: Oral  Resp: 16  Height: 5\' 6"  (1.676 m)  Weight: 185 lb 12.8 oz (84.278 kg)  SpO2: 93%      Assessment & Plan:

## 2014-12-21 NOTE — Progress Notes (Signed)
Pre visit review using our clinic review tool, if applicable. No additional management support is needed unless otherwise documented below in the visit note. 

## 2014-12-21 NOTE — Patient Instructions (Signed)
We have sent in the simvastatin for you to pick up and start taking.   We are checking some labs today for the fluid in the legs to make sure it is not coming from the heart.

## 2014-12-21 NOTE — Assessment & Plan Note (Signed)
Checking CMP and BNP. No indication for doppler as no change in the swelling and no immobilization recently. Talked to her about compression stockings but the swelling does not bother her and she does not want at this time.

## 2014-12-21 NOTE — Assessment & Plan Note (Signed)
Checking HgA1c, foot exam done today. Adjust therapy as needed. Currently on metformin and lisinopril and statin.

## 2014-12-22 NOTE — Progress Notes (Signed)
Loop recorder 

## 2014-12-25 LAB — CUP PACEART REMOTE DEVICE CHECK: MDC IDC SESS DTM: 20160919213617

## 2014-12-25 NOTE — Progress Notes (Signed)
Carelink summary report received. Battery status OK. Normal device function. No new symptom episodes, tachy episodes, brady, or pause episodes. No new AF episodes. Monthly summary reports and ROV with SK in 04/2015.

## 2015-01-11 ENCOUNTER — Encounter: Payer: Self-pay | Admitting: Internal Medicine

## 2015-01-18 ENCOUNTER — Encounter: Payer: Self-pay | Admitting: Internal Medicine

## 2015-01-19 ENCOUNTER — Ambulatory Visit (INDEPENDENT_AMBULATORY_CARE_PROVIDER_SITE_OTHER): Payer: Medicare Other | Admitting: *Deleted

## 2015-01-19 DIAGNOSIS — I639 Cerebral infarction, unspecified: Secondary | ICD-10-CM | POA: Diagnosis not present

## 2015-01-20 NOTE — Progress Notes (Signed)
LOOP RECORDER  

## 2015-01-28 ENCOUNTER — Telehealth: Payer: Self-pay | Admitting: Internal Medicine

## 2015-01-28 NOTE — Telephone Encounter (Signed)
This will not interact.

## 2015-01-28 NOTE — Telephone Encounter (Signed)
Pt stated that she is considering taking osteo bi flex and she was wondering if its going to be inter with any of her medication that she is on right now. Please cal pt back

## 2015-02-01 ENCOUNTER — Telehealth: Payer: Self-pay | Admitting: Internal Medicine

## 2015-02-01 NOTE — Telephone Encounter (Signed)
ERROR

## 2015-02-01 NOTE — Telephone Encounter (Signed)
Patient's spouse called because he said he didn't get a call back answer.  Dr. Nathanial Millman response to their question was given to him.

## 2015-02-08 LAB — CUP PACEART REMOTE DEVICE CHECK: Date Time Interrogation Session: 20161019220607

## 2015-02-08 NOTE — Progress Notes (Signed)
Carelink summary report received. Battery status OK. Normal device function. No new symptom episodes, tachy episodes, brady, or pause episodes. No new AF episodes. Monthly summary reports and ROV with SK in 04/2015.

## 2015-02-18 ENCOUNTER — Ambulatory Visit (INDEPENDENT_AMBULATORY_CARE_PROVIDER_SITE_OTHER): Payer: Medicare Other | Admitting: *Deleted

## 2015-02-18 DIAGNOSIS — I639 Cerebral infarction, unspecified: Secondary | ICD-10-CM

## 2015-02-21 NOTE — Progress Notes (Signed)
LOOP RECORDER  

## 2015-03-14 DIAGNOSIS — H353131 Nonexudative age-related macular degeneration, bilateral, early dry stage: Secondary | ICD-10-CM | POA: Diagnosis not present

## 2015-03-14 DIAGNOSIS — H2513 Age-related nuclear cataract, bilateral: Secondary | ICD-10-CM | POA: Diagnosis not present

## 2015-03-14 DIAGNOSIS — H5203 Hypermetropia, bilateral: Secondary | ICD-10-CM | POA: Diagnosis not present

## 2015-03-16 IMAGING — US US SOFT TISSUE HEAD/NECK
1 series · 13 of 25 positions shown · non-contrast
Comparison: None.

CLINICAL DATA: Follow-up thyroid nodule seen on prior MRI.

EXAM:
THYROID ULTRASOUND
TECHNIQUE: Ultrasound examination of the thyroid gland and adjacent soft
tissues was performed.

[Series 1: us soft tissue head/neck · 0.08mm/px · 13 of 43 slices shown]
[im 1/43]
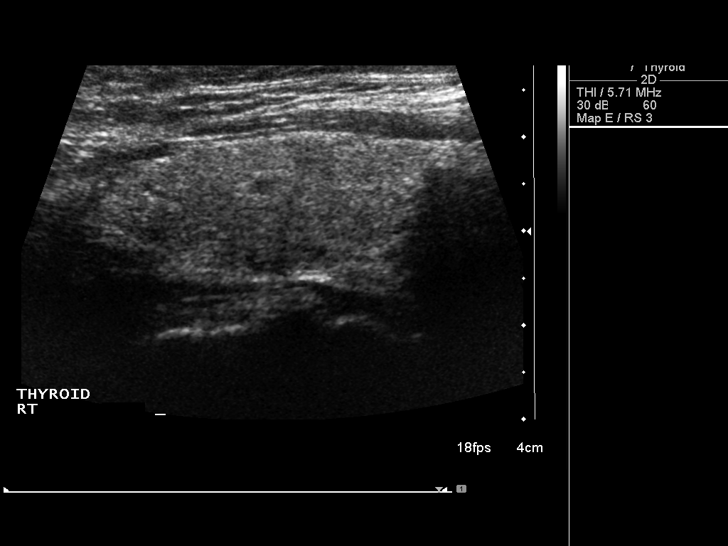
[im 4/43]
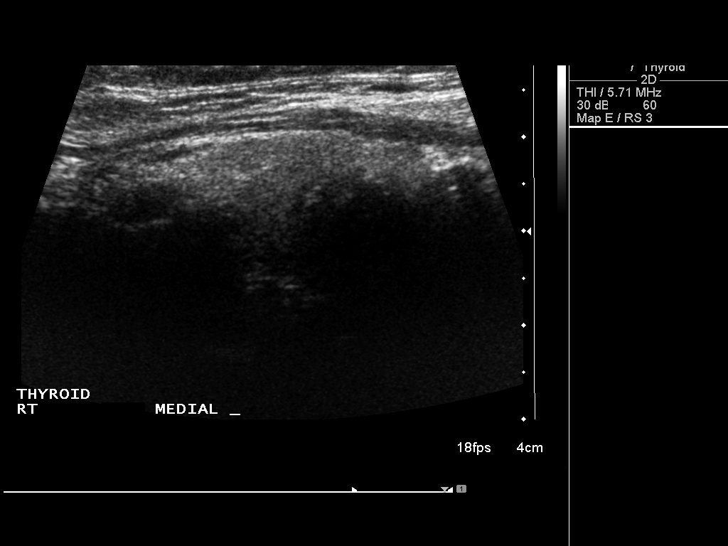
[im 8/43]
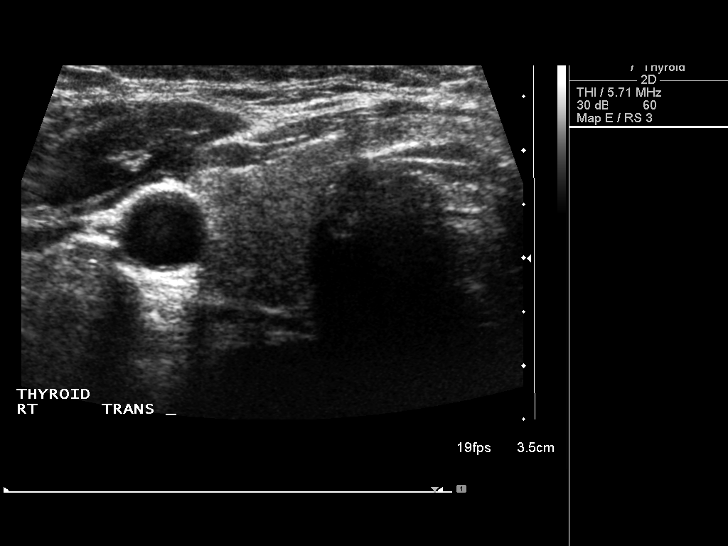
[im 11/43]
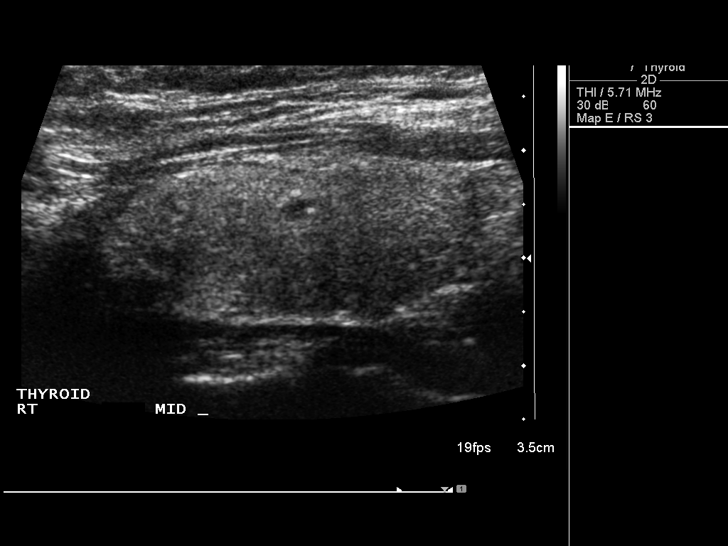
[im 15/43]
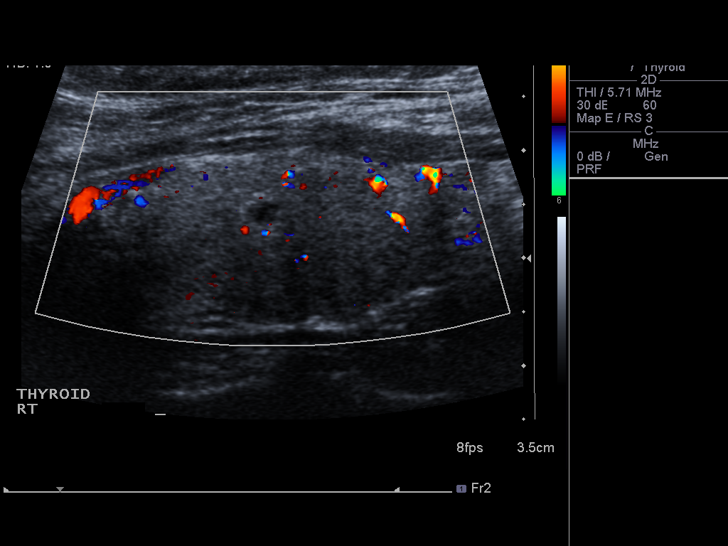
[im 18/43]
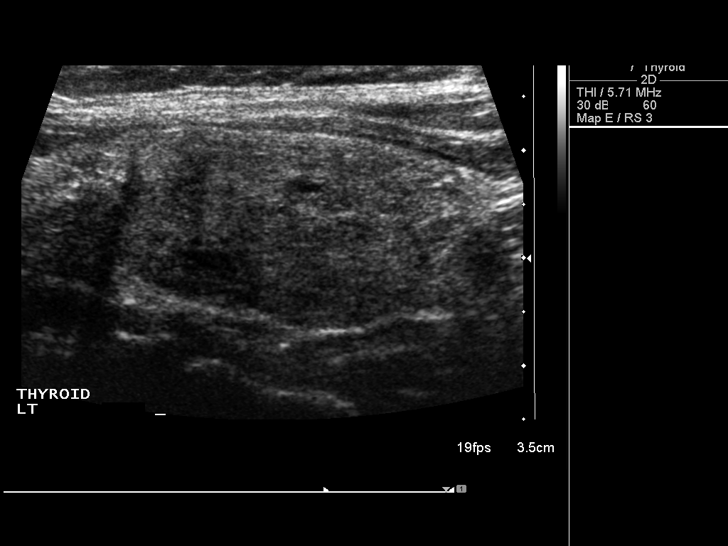
[im 22/43]
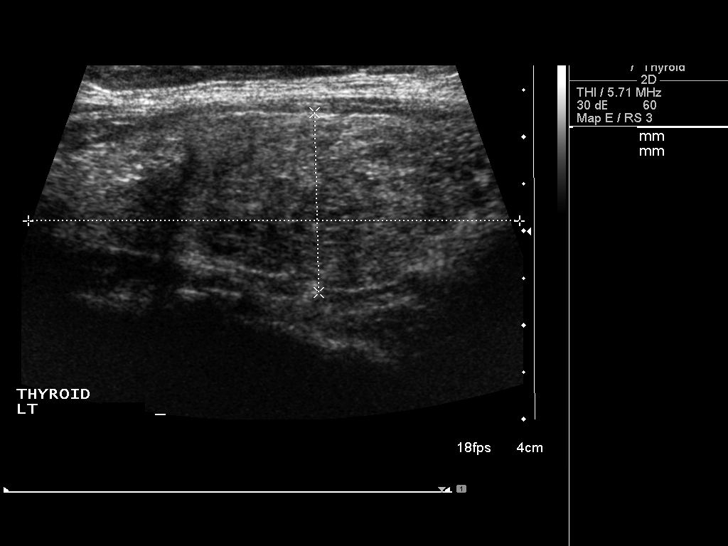
[im 25/43]
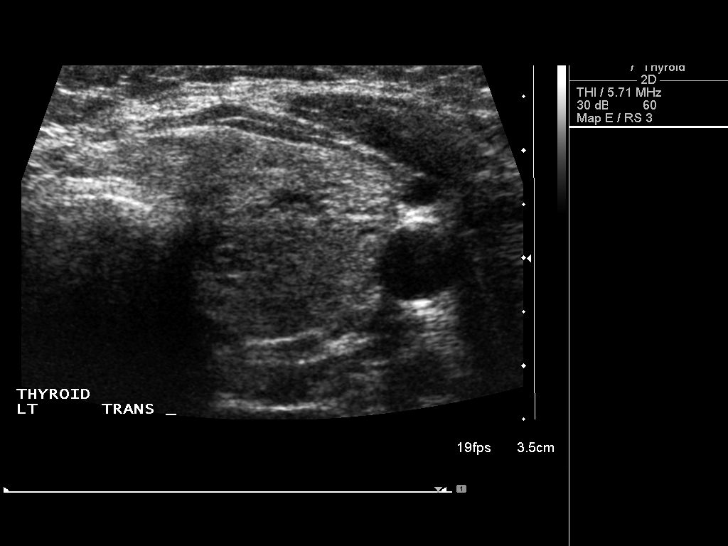
[im 29/43]
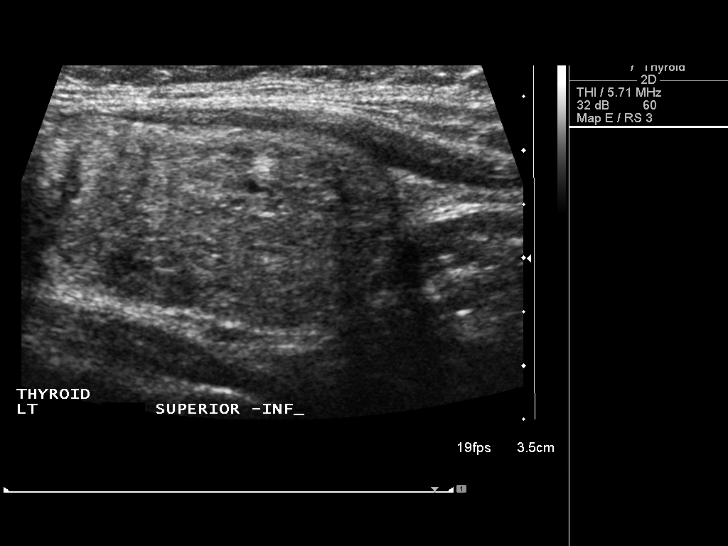
[im 32/43]
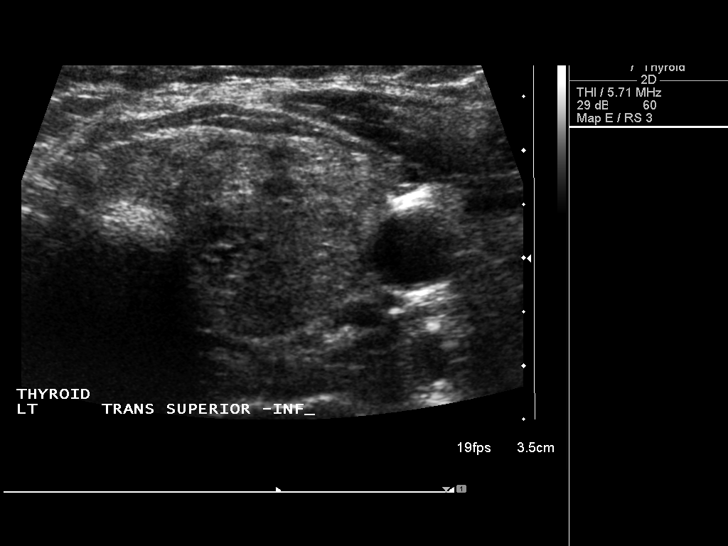
[im 36/43]
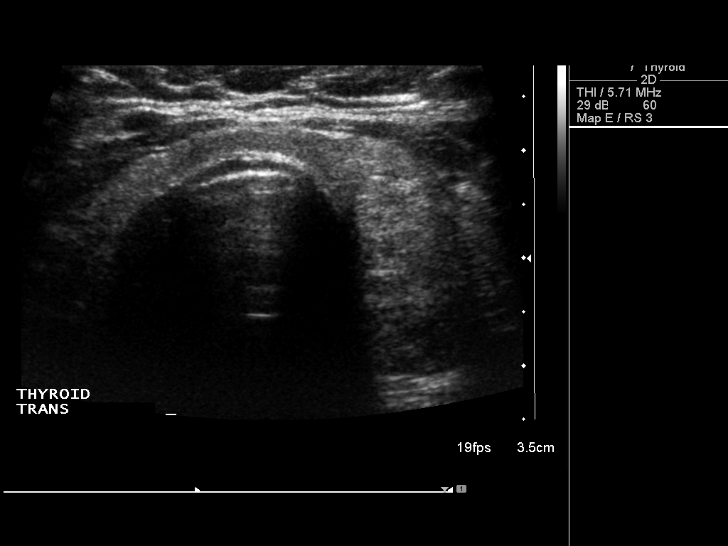
[im 39/43]
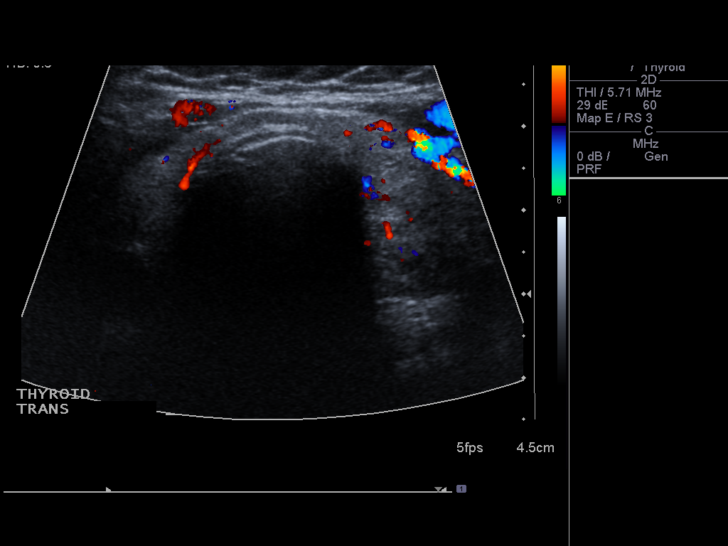
[im 43/43]
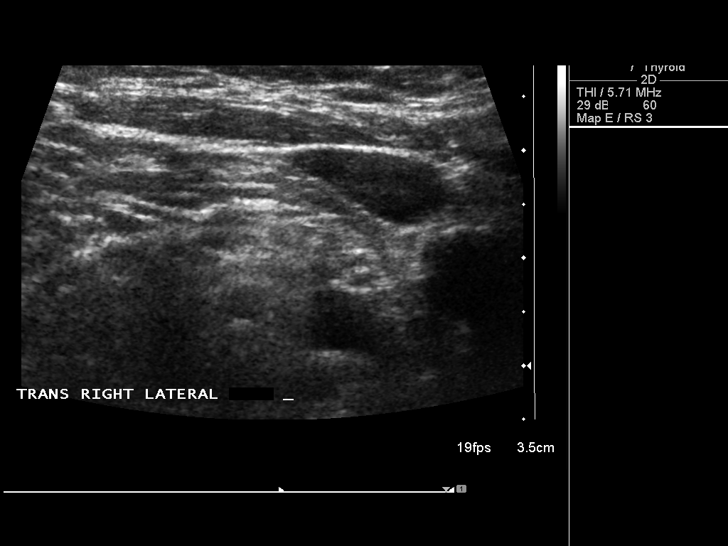

[13 of 25 positions shown; findings below may reference images not displayed]

Note, prior MRIs performed 04/28/2004 and
04/16/2004 do not demonstrate a thyroid nodule.
FINDINGS: Right thyroid lobe

Measurements: Normal in size measuring 4.0 x 1.6 x 1.2 cm.

Right, mid - 0.4 x 0.3 x 0.3 cm - anechoic with peripheral echogenic
nodule suggestive of colloid.

Left thyroid lobe

Measurements: Borderline enlarged measuring 5.2 x 1.9 x 2.0 cm.

Left, mid - 2.6 x 1.8 x 1.8 cm - mixed echogenic, solid

Isthmus

Thickness: Normal in size measures 0.2 cm in diameter.

No discrete nodules are identified within the thyroid isthmus

Lymphadenopathy

None visualized.
IMPRESSION: Findings suggestive of multi nodular goiter. The dominant
approximately 2.6 cm nodule within the left lobe of the thyroid
meets imaging criteria to recommend percutaneous sampling as
clinically indicated. This recommendation follows the consensus
statement: Management of Thyroid Nodules Detected at US: Society of
Radiologists in Ultrasound Consensus Conference Statement. Radiology

## 2015-03-21 LAB — CUP PACEART REMOTE DEVICE CHECK: MDC IDC SESS DTM: 20161118223627

## 2015-03-22 ENCOUNTER — Ambulatory Visit (INDEPENDENT_AMBULATORY_CARE_PROVIDER_SITE_OTHER): Payer: Medicare Other | Admitting: *Deleted

## 2015-03-22 DIAGNOSIS — I639 Cerebral infarction, unspecified: Secondary | ICD-10-CM | POA: Diagnosis not present

## 2015-03-22 NOTE — Progress Notes (Signed)
Carelink Summary Report / Loop Recorder 

## 2015-04-19 ENCOUNTER — Ambulatory Visit (INDEPENDENT_AMBULATORY_CARE_PROVIDER_SITE_OTHER): Payer: Medicare Other | Admitting: *Deleted

## 2015-04-19 DIAGNOSIS — I639 Cerebral infarction, unspecified: Secondary | ICD-10-CM | POA: Diagnosis not present

## 2015-04-20 NOTE — Progress Notes (Signed)
Carelink Summary Report / Loop Recorder 

## 2015-04-25 ENCOUNTER — Encounter: Payer: Self-pay | Admitting: Internal Medicine

## 2015-04-25 ENCOUNTER — Ambulatory Visit (INDEPENDENT_AMBULATORY_CARE_PROVIDER_SITE_OTHER): Payer: Medicare Other | Admitting: Internal Medicine

## 2015-04-25 VITALS — BP 102/60 | HR 62 | Temp 98.3°F | Resp 12 | Ht 66.0 in | Wt 189.8 lb

## 2015-04-25 DIAGNOSIS — M25559 Pain in unspecified hip: Secondary | ICD-10-CM

## 2015-04-25 DIAGNOSIS — M546 Pain in thoracic spine: Secondary | ICD-10-CM

## 2015-04-25 MED ORDER — DICLOFENAC SODIUM 1 % TD GEL: 2.0000 g | Freq: Three times a day (TID) | TRANSDERMAL | Status: DC | PRN

## 2015-04-25 NOTE — Progress Notes (Signed)
   Subjective:    Patient ID: Tiffany Wiggins, female    DOB: 12/17/1945, 70 y.o.   MRN: [id number]  HPI The patient is a 70 YO female coming in for back pain for several weeks. She denies doing anything to injure it. She is also having pain in her right hip with walking for the same time or slightly longer. Has tried resting which helps the pain. Pain worsened by standing for any length of time. Has history of bulging discs and past back surgery. Has not tried any OTC medicines for it. Has done physical therapy in the past and had positive experience with it. Denies numbness in her leg.   Review of Systems  Constitutional: Positive for activity change. Negative for fever, chills, appetite change, fatigue and unexpected weight change.  Respiratory: Negative.   Cardiovascular: Negative.   Gastrointestinal: Negative.   Musculoskeletal: Positive for back pain and arthralgias. Negative for myalgias and joint swelling.  Skin: Negative.   Neurological: Negative.   Hematological: Negative.       Objective:   Physical Exam  Constitutional: She is oriented to person, place, and time. She appears well-developed and well-nourished.  HENT:  Head: Normocephalic and atraumatic.  Eyes: EOM are normal.  Neck: Normal range of motion.  Cardiovascular: Normal rate and regular rhythm.   Pulmonary/Chest: Effort normal and breath sounds normal.  Abdominal: Soft. She exhibits no distension. There is no tenderness. There is no rebound.  Musculoskeletal: She exhibits tenderness.  Pain in the thoracic region in a band across the back, some pain in the lateral gluteal region.   Neurological: She is alert and oriented to person, place, and time. Coordination normal.  Skin: Skin is warm and dry.   Filed Vitals:   04/25/15 1422  BP: 102/60  Pulse: 62  Temp: 98.3 F (36.8 C)  TempSrc: Oral  Resp: 12  Height: 5\' 6"  (1.676 m)  Weight: 189 lb 12.8 oz (86.093 kg)  SpO2: 97%      Assessment & Plan:

## 2015-04-25 NOTE — Patient Instructions (Signed)
We have gotten the physical therapy referral done. In the meantime we have sent in an anti-inflammatory gel that you can use on your foot and hip for the pain. Rub the gel where it hurts up to 3 times per day.

## 2015-04-25 NOTE — Progress Notes (Signed)
Pre visit review using our clinic review tool, if applicable. No additional management support is needed unless otherwise documented below in the visit note. 

## 2015-04-25 NOTE — Assessment & Plan Note (Signed)
This is likely referred from the back. Getting PT on the back to address and voltaren gel in the meantime for her gluteal region.

## 2015-04-25 NOTE — Assessment & Plan Note (Signed)
Referral for PT today to help with her pain. Suspect that the right gluteal and knee pain is referred from the back. Pain worse with standing and improved with sitting. Talked to her about stretching and strengthening the back. Rx for voltaren gel for pain.

## 2015-04-26 ENCOUNTER — Telehealth: Payer: Self-pay | Admitting: Internal Medicine

## 2015-04-26 DIAGNOSIS — M5489 Other dorsalgia: Secondary | ICD-10-CM

## 2015-04-26 NOTE — Telephone Encounter (Signed)
Patient's husband called to advise that there was intent to go to another PT that she has been to before  [facility] Address: [address] [place]  [address] Phone: [phone] No preferred provider   Please enter a new order for this location

## 2015-04-27 ENCOUNTER — Telehealth: Payer: Self-pay

## 2015-04-27 NOTE — Telephone Encounter (Signed)
Placed.

## 2015-04-27 NOTE — Telephone Encounter (Signed)
Prior authorization for diclofenac sodium has been done. Waiting on response.

## 2015-05-07 LAB — CUP PACEART REMOTE DEVICE CHECK: Date Time Interrogation Session: 20161218223715

## 2015-05-10 ENCOUNTER — Other Ambulatory Visit: Payer: Self-pay | Admitting: Internal Medicine

## 2015-05-14 ENCOUNTER — Other Ambulatory Visit: Payer: Self-pay | Admitting: Internal Medicine

## 2015-05-19 ENCOUNTER — Ambulatory Visit (INDEPENDENT_AMBULATORY_CARE_PROVIDER_SITE_OTHER): Payer: Medicare Other | Admitting: *Deleted

## 2015-05-19 DIAGNOSIS — I639 Cerebral infarction, unspecified: Secondary | ICD-10-CM

## 2015-05-20 NOTE — Progress Notes (Signed)
Carelink Summary Report / Loop Recorder 

## 2015-05-29 ENCOUNTER — Other Ambulatory Visit: Payer: Self-pay | Admitting: Internal Medicine

## 2015-06-06 LAB — CUP PACEART REMOTE DEVICE CHECK: Date Time Interrogation Session: 20170117230630

## 2015-06-06 NOTE — Progress Notes (Signed)
Carelink summary report received. Battery status OK. Normal device function. No new symptom episodes, tachy episodes, brady, or pause episodes. No new AF episodes. Monthly summary reports and ROV/PRN 

## 2015-06-08 NOTE — Progress Notes (Signed)
Carelink summary report received. Battery status OK. Normal device function. No new symptom episodes, tachy episodes, brady, or pause episodes. No new AF episodes. Monthly summary reports and ROV/PRN 

## 2015-06-09 ENCOUNTER — Encounter: Payer: Self-pay | Admitting: Internal Medicine

## 2015-06-09 ENCOUNTER — Ambulatory Visit (INDEPENDENT_AMBULATORY_CARE_PROVIDER_SITE_OTHER): Payer: Medicare Other | Admitting: Internal Medicine

## 2015-06-09 VITALS — BP 132/68 | HR 51 | Ht 66.0 in | Wt 189.0 lb

## 2015-06-09 DIAGNOSIS — R002 Palpitations: Secondary | ICD-10-CM

## 2015-06-09 DIAGNOSIS — R001 Bradycardia, unspecified: Secondary | ICD-10-CM | POA: Diagnosis not present

## 2015-06-09 LAB — CUP PACEART INCLINIC DEVICE CHECK: MDC IDC SESS DTM: 20170309170326

## 2015-06-09 NOTE — Patient Instructions (Signed)
Medication Instructions:  Your physician has recommended you make the following change in your medication:  1) STOP Norvasc (see if stopping this medication improves leg swelling)  Labwork: None ordered  Testing/Procedures: None ordered  Follow-Up: Your physician wants you to follow-up in: 1 year with Dr. Caryl Comes.  You will receive a reminder letter in the mail two months in advance. If you don't receive a letter, please call our office to schedule the follow-up appointment.   Any Other Special Instructions Will Be Listed Below (If Applicable). -  2 week exclusion trial of Zocor to see if cramping improves. -  Please follow up with your primary care provider about the outcome with both of these medications.   If you need a refill on your cardiac medications before your next appointment, please call your pharmacy.  Thank you for choosing [facility]!!

## 2015-06-09 NOTE — Progress Notes (Signed)
Patient Care Team: Hoyt Koch, MD as PCP - General (Internal Medicine)   HPI  Tiffany Wiggins is a 69 y.o. female Seen in follow-up for history of palpitations with cryptogenic stroke is status post loop recorder insertion  There've been no interval arrhythmias.  She complains of peripheral edema. She takes diuretics when necessary. She also has had problems with cramps associated with statin therapy. In the past this prompted a switch from rosuvastatin--simvastatin  Exercise tolerance is limited more by her back ithan by her cardiovascular fitness  Records and Results Reviewed   Past Medical History  Diagnosis Date  . Hyperglycemia   . Hyperlipidemia   . PUD (peptic ulcer disease)   . IBS (irritable bowel syndrome)   . Hypertension     PCP GAVE FOR HER  BLOOD PRESSURE  . Stroke ([place])     TIA  BACK IN  04/2004  . [id number])     Past Surgical History  Procedure Laterality Date  . Tonsillectomy    . Laparoscopy for fertility work up    . Abdominal hysterectomy    . Oophorectomy    . Tonsillectomy    . Lumbar laminectomy/decompression microdiscectomy  04/25/2011    Procedure: LUMBAR LAMINECTOMY/DECOMPRESSION MICRODISCECTOMY;  Surgeon: [name], MD;  Location: [place] [facility];  Service: Neurosurgery;  Laterality: Right;  Right Lumbar Four-Five Discectomy  . Loop recorder implant N/A 03/24/2014    Procedure: LOOP RECORDER IMPLANT;  Surgeon: Deboraha Sprang, MD;  Location: [facility] CATH LAB;  Service: Cardiovascular;  Laterality: N/A;    Current Outpatient Prescriptions  Medication Sig Dispense Refill  . acetaminophen (TYLENOL) 650 MG CR tablet Take 650 mg by mouth 2 (two) times daily.    Marland Kitchen amLODipine (NORVASC) 10 MG tablet TAKE ONE TABLET BY MOUTH ONCE DAILY 90 tablet 0  . aspirin 325 MG tablet Take 325 mg by mouth daily.    . Coenzyme Q10 (COQ10) 100 MG CAPS Take 1 tablet by mouth daily.    . furosemide (LASIX) 20 MG tablet TAKE ONE TABLET BY MOUTH  ONCE DAILY 90 tablet 0  . lisinopril (PRINIVIL,ZESTRIL) 40 MG tablet TAKE ONE TABLET BY MOUTH ONCE DAILY 90 tablet 0  . metFORMIN (GLUCOPHAGE) 500 MG tablet Take 1 tablet (500 mg total) by mouth at bedtime. At bedtime 90 tablet 3  . MULTIPLE VITAMIN PO Take 1 tablet by mouth daily.     . Multiple Vitamins-Minerals (VISION-VITE PRESERVE PO) Take 1 tablet by mouth 2 (two) times daily.    Marland Kitchen omeprazole (PRILOSEC) 20 MG capsule Take 1 capsule (20 mg total) by mouth daily. 90 capsule 3  . simvastatin (ZOCOR) 40 MG tablet Take 1 tablet (40 mg total) by mouth at bedtime. 90 tablet 3   No current facility-administered medications for this visit.    Allergies  Allergen Reactions  . Codeine Nausea Only  . Morphine And Related     Itching       Review of Systems negative except from HPI and PMH  Physical Exam BP 132/68 mmHg  Pulse 51  Ht 5\' 6"  (1.676 m)  Wt 189 lb (85.73 kg)  BMI 30.52 kg/m2 Well developed and well nourished in no acute distress HENT normal E scleral and icterus clear Neck Supple JVP flat; carotids brisk and full Clear to ausculation  Regular rate and rhythm, no murmurs gallops or rub Soft with active bowel sounds No clubbing cyanosis  Edema Alert and oriented, grossly normal motor  and sensory function Skin Warm and Dry  ECG demonstrates sinus rhythm at 53 Intervals 15/08/42  Assessment and  Plan Cryptogenic stroke  Implantable loop recorder  Hypertension  Sinus bradycardia  Leg cramps   The patient major complaints related to her back and her legs. I suggested water aerobics for exercise given her back issues. We will undertake an exclusion trial of her simvastatin to see if is contributing to her leg pain. In the event that it does not have an impact, she will resume statins an  start Mag-Ox 5  We will discontinue the amlodipine to see if it is contributing to her edema. I anticipate her blood pressures remain up in the 150-60 range. If that's in fact  the case she would need to follow-up with her PCP at that juncture for augmented blood pressure therapy she does not have symptoms suggestive of sleep apnea.

## 2015-06-20 ENCOUNTER — Ambulatory Visit (INDEPENDENT_AMBULATORY_CARE_PROVIDER_SITE_OTHER): Payer: Medicare Other | Admitting: *Deleted

## 2015-06-20 DIAGNOSIS — I639 Cerebral infarction, unspecified: Secondary | ICD-10-CM

## 2015-06-21 NOTE — Progress Notes (Signed)
Carelink Summary Report / Loop Recorder 

## 2015-07-04 ENCOUNTER — Encounter: Payer: Self-pay | Admitting: Family

## 2015-07-04 ENCOUNTER — Ambulatory Visit (INDEPENDENT_AMBULATORY_CARE_PROVIDER_SITE_OTHER): Payer: Medicare Other | Admitting: Family

## 2015-07-04 VITALS — BP 132/78 | HR 54 | Temp 97.3°F | Resp 16 | Ht 66.0 in | Wt 189.0 lb

## 2015-07-04 DIAGNOSIS — J029 Acute pharyngitis, unspecified: Secondary | ICD-10-CM | POA: Insufficient documentation

## 2015-07-04 MED ORDER — PROMETHAZINE-CODEINE 6.25-10 MG/5ML PO SYRP: 5.0000 mL | ORAL_SOLUTION | Freq: Every evening | ORAL | Status: DC | PRN

## 2015-07-04 MED ORDER — AZITHROMYCIN 250 MG PO TABS: ORAL_TABLET | ORAL | Status: DC

## 2015-07-04 NOTE — Progress Notes (Signed)
Pre visit review using our clinic review tool, if applicable. No additional management support is needed unless otherwise documented below in the visit note. 

## 2015-07-04 NOTE — Progress Notes (Signed)
Subjective:    Patient ID: Tiffany Wiggins, female    DOB: 06/29/1945, 70 y.o.   MRN: [id number]  Chief Complaint  Patient presents with  . Sore Throat    cough, sore throat, does have some congestion, throat gets really sore in the morning, x1 week    HPI:  Tiffany Wiggins is a 70 y.o. female who  has a past medical history of Hyperglycemia; Hyperlipidemia; PUD (peptic ulcer disease); IBS (irritable bowel syndrome); Hypertension; Stroke [facility]); and Headache(784.0). and presents today for an acute office visit.  This is a new problem. Associated symptom of cough, sore throat, and congestion has been going on for approximately one week. Timing of the symptoms is generally worse in the morning. Described as throat feeling raw. Severity of her symptoms wakes her up at night.  No fevers. Modifying factors include Benedryl and claratin which did not help very much. Course of the symptoms has stayed about he same since initial onset. No recent antibiotics.    Allergies  Allergen Reactions  . Codeine Nausea Only  . Morphine And Related     Itching      Current Outpatient Prescriptions on File Prior to Visit  Medication Sig Dispense Refill  . acetaminophen (TYLENOL) 650 MG CR tablet Take 650 mg by mouth 2 (two) times daily.    Marland Kitchen aspirin 325 MG tablet Take 325 mg by mouth daily.    . Coenzyme Q10 (COQ10) 100 MG CAPS Take 1 tablet by mouth daily.    . furosemide (LASIX) 20 MG tablet TAKE ONE TABLET BY MOUTH ONCE DAILY 90 tablet 0  . lisinopril (PRINIVIL,ZESTRIL) 40 MG tablet TAKE ONE TABLET BY MOUTH ONCE DAILY 90 tablet 0  . metFORMIN (GLUCOPHAGE) 500 MG tablet Take 1 tablet (500 mg total) by mouth at bedtime. At bedtime 90 tablet 3  . MULTIPLE VITAMIN PO Take 1 tablet by mouth daily.     . Multiple Vitamins-Minerals (VISION-VITE PRESERVE PO) Take 1 tablet by mouth 2 (two) times daily.    Marland Kitchen omeprazole (PRILOSEC) 20 MG capsule Take 1 capsule (20 mg total) by mouth daily. 90 capsule 3  .  simvastatin (ZOCOR) 40 MG tablet Take 1 tablet (40 mg total) by mouth at bedtime. 90 tablet 3   No current facility-administered medications on file prior to visit.     Past Surgical History  Procedure Laterality Date  . Tonsillectomy    . Laparoscopy for fertility work up    . Abdominal hysterectomy    . Oophorectomy    . Tonsillectomy    . Lumbar laminectomy/decompression microdiscectomy  04/25/2011    Procedure: LUMBAR LAMINECTOMY/DECOMPRESSION MICRODISCECTOMY;  Surgeon: [name], MD;  Location: [place] [facility];  Service: Neurosurgery;  Laterality: Right;  Right Lumbar Four-Five Discectomy  . Loop recorder implant N/A 03/24/2014    Procedure: LOOP RECORDER IMPLANT;  Surgeon: Deboraha Sprang, MD;  Location: [facility] CATH LAB;  Service: Cardiovascular;  Laterality: N/A;      Review of Systems  Constitutional: Negative for fever and chills.  HENT: Positive for congestion.   Respiratory: Positive for cough.   Neurological: Negative for headaches.      Objective:    BP 132/78 mmHg  Pulse 54  Temp(Src) 97.3 F (36.3 C) (Oral)  Resp 16  Ht 5\' 6"  (1.676 m)  Wt 189 lb (85.73 kg)  BMI 30.52 kg/m2  SpO2 97% Nursing note and vital signs reviewed.  Physical Exam  Constitutional: She is oriented to person,  place, and time. She appears well-developed and well-nourished. No distress.  HENT:  Right Ear: Hearing, tympanic membrane, external ear and ear canal normal.  Left Ear: Hearing, tympanic membrane, external ear and ear canal normal.  Nose: Nose normal. Right sinus exhibits no maxillary sinus tenderness and no frontal sinus tenderness. Left sinus exhibits no maxillary sinus tenderness and no frontal sinus tenderness.  Mouth/Throat: Uvula is midline and mucous membranes are normal. Posterior oropharyngeal erythema present.  Cardiovascular: Normal rate, regular rhythm, normal heart sounds and intact distal pulses.   Pulmonary/Chest: Effort normal and breath sounds normal.    Neurological: She is alert and oriented to person, place, and time.  Skin: Skin is warm and dry.  Psychiatric: She has a normal mood and affect. Her behavior is normal. Judgment and thought content normal.       Assessment & Plan:   Problem List Items Addressed This Visit      Other   Sore throat - Primary    Symptoms and exam consistent with possible viral infection secondary to allergies although cannot rule out upper respiratory infection. Treat conservatively with over-the-counter medications as needed for symptom relief and supportive care. Watchful waiting and written prescription for azithromycin provided if symptoms worsen or fail to improve. Follow-up as needed.          I am having Tiffany Wiggins start on promethazine-codeine and azithromycin. I am also having her maintain her MULTIPLE VITAMIN PO, aspirin, CoQ10, Multiple Vitamins-Minerals (VISION-VITE PRESERVE PO), omeprazole, metFORMIN, simvastatin, lisinopril, furosemide, and acetaminophen.   Meds ordered this encounter  Medications  . promethazine-codeine (PHENERGAN WITH CODEINE) 6.25-10 MG/5ML syrup    Sig: Take 5 mLs by mouth at bedtime as needed for cough.    Dispense:  118 mL    Refill:  0    Order Specific Question:  Supervising Provider    Answer:  Pricilla Holm A [id number]  . azithromycin (ZITHROMAX) 250 MG tablet    Sig: Take 2 tablets by mouth for 1 day and then 1 tablet by mouth daily for 4 days.    Dispense:  6 tablet    Refill:  0    Order Specific Question:  Supervising Provider    Answer:  Pricilla Holm A [id number]     Follow-up: Return if symptoms worsen or fail to improve.  Mauricio Po, FNP

## 2015-07-04 NOTE — Assessment & Plan Note (Signed)
Symptoms and exam consistent with possible viral infection secondary to allergies although cannot rule out upper respiratory infection. Treat conservatively with over-the-counter medications as needed for symptom relief and supportive care. Watchful waiting and written prescription for azithromycin provided if symptoms worsen or fail to improve. Follow-up as needed.

## 2015-07-04 NOTE — Patient Instructions (Signed)
Thank you for choosing [facility].  Summary/Instructions:  Your prescription(s) have been submitted to your pharmacy or been printed and provided for you. Please take as directed and contact our office if you believe you are having problem(s) with the medication(s) or have any questions.  If your symptoms worsen or fail to improve, please contact our office for further instruction, or in case of emergency go directly to the emergency room at the closest medical facility.   General Recommendations:    Please drink plenty of fluids.  Get plenty of rest   Sleep in humidified air  Use saline nasal sprays  Netti pot   OTC Medications:  Decongestants - helps relieve congestion   Flonase (generic fluticasone) or Nasacort (generic triamcinolone) - please make sure to use the "cross-over" technique at a 45 degree angle towards the opposite eye as opposed to straight up the nasal passageway.   Sudafed (generic pseudoephedrine - Note this is the one that is available behind the pharmacy counter); Products with phenylephrine (-PE) may also be used but is often not as effective as pseudoephedrine.   If you have HIGH BLOOD PRESSURE - Coricidin HBP; AVOID any product that is -D as this contains pseudoephedrine which may increase your blood pressure.  Afrin (oxymetazoline) every 6-8 hours for up to 3 days.   Allergies - helps relieve runny nose, itchy eyes and sneezing   Claritin (generic loratidine), Allegra (fexofenidine), or Zyrtec (generic cyrterizine) for runny nose. These medications should not cause drowsiness.  Note - Benadryl (generic diphenhydramine) may be used however may cause drowsiness  Cough -   Delsym or Robitussin (generic dextromethorphan)  Expectorants - helps loosen mucus to ease removal   Mucinex (generic guaifenesin) as directed on the package.  Headaches / General Aches   Tylenol (generic acetaminophen) - DO NOT EXCEED 3 grams (3,000 mg) in a 24  hour time period  Advil/Motrin (generic ibuprofen)   Sore Throat -   Salt water gargle   Chloraseptic (generic benzocaine) spray or lozenges / Sucrets (generic dyclonine)      

## 2015-07-18 ENCOUNTER — Ambulatory Visit (INDEPENDENT_AMBULATORY_CARE_PROVIDER_SITE_OTHER): Payer: Medicare Other | Admitting: *Deleted

## 2015-07-18 DIAGNOSIS — I639 Cerebral infarction, unspecified: Secondary | ICD-10-CM | POA: Diagnosis not present

## 2015-07-19 ENCOUNTER — Other Ambulatory Visit (INDEPENDENT_AMBULATORY_CARE_PROVIDER_SITE_OTHER): Payer: Medicare Other

## 2015-07-19 ENCOUNTER — Telehealth: Payer: Self-pay | Admitting: Internal Medicine

## 2015-07-19 ENCOUNTER — Encounter: Payer: Self-pay | Admitting: Internal Medicine

## 2015-07-19 ENCOUNTER — Ambulatory Visit (INDEPENDENT_AMBULATORY_CARE_PROVIDER_SITE_OTHER): Payer: Medicare Other | Admitting: Internal Medicine

## 2015-07-19 VITALS — BP 136/62 | HR 59 | Temp 98.0°F | Resp 14 | Ht 66.0 in | Wt 189.0 lb

## 2015-07-19 DIAGNOSIS — I872 Venous insufficiency (chronic) (peripheral): Secondary | ICD-10-CM | POA: Diagnosis not present

## 2015-07-19 DIAGNOSIS — I10 Essential (primary) hypertension: Secondary | ICD-10-CM

## 2015-07-19 DIAGNOSIS — E119 Type 2 diabetes mellitus without complications: Secondary | ICD-10-CM

## 2015-07-19 LAB — COMPREHENSIVE METABOLIC PANEL
ALBUMIN: 4.1 g/dL (ref 3.5–5.2)
ALT: 14 U/L (ref 0–35)
AST: 14 U/L (ref 0–37)
Alkaline Phosphatase: 61 U/L (ref 39–117)
BUN: 23 mg/dL (ref 6–23)
CALCIUM: 9.8 mg/dL (ref 8.4–10.5)
CHLORIDE: 108 meq/L (ref 96–112)
CO2: 29 meq/L (ref 19–32)
CREATININE: 1.1 mg/dL (ref 0.40–1.20)
GFR: 52.45 mL/min — ABNORMAL LOW (ref >60.00)
Glucose, Bld: 116 mg/dL — ABNORMAL HIGH (ref 70–99)
POTASSIUM: 5.1 meq/L (ref 3.5–5.1)
SODIUM: 144 meq/L (ref 135–145)
Total Bilirubin: 0.4 mg/dL (ref 0.2–1.2)
Total Protein: 6.8 g/dL (ref 6.0–8.3)

## 2015-07-19 LAB — LIPID PANEL
CHOL/HDL RATIO: 5
Cholesterol: 258 mg/dL — ABNORMAL HIGH (ref 0–200)
HDL: 54.3 mg/dL (ref >39.00)
NONHDL: 203.33
Triglycerides: 274 mg/dL — ABNORMAL HIGH (ref 0.0–149.0)
VLDL: 54.8 mg/dL — AB (ref 0.0–40.0)

## 2015-07-19 LAB — HEMOGLOBIN A1C: Hgb A1c MFr Bld: 6.7 % — ABNORMAL HIGH (ref 4.6–6.5)

## 2015-07-19 LAB — LDL CHOLESTEROL, DIRECT: LDL DIRECT: 167 mg/dL

## 2015-07-19 MED ORDER — PRAVASTATIN SODIUM 40 MG PO TABS: 40.0000 mg | ORAL_TABLET | Freq: Every day | ORAL | Status: DC

## 2015-07-19 NOTE — Patient Instructions (Signed)
We have sent in the pravastatin (pravachol) for the cholesterol that should not cause muscle aches.   We are checking the blood work today and will call you back with the results.

## 2015-07-19 NOTE — Progress Notes (Signed)
   Subjective:    Patient ID: Tiffany Wiggins, female    DOB: 1946-02-04, 70 y.o.   MRN: [id number]  HPI The patient is a 70 YO female coming in for follow up of her blood pressure. The cardiologist took her off amlodipine because of the swelling in her legs. She stopped the medicine for about 1 week and the swelling decreased slightly. She then got some chest pains with exertion. This is how she started seeing a cardiologist in the past so went back on the amlodipine. No symptoms like that now. Able to exert herself without pain or problems. She does have compression stockings but has a hard time getting them on and thinks she is allergic to them as she broke out in a rash with them. Does not bother her and she tries to elevate her legs   Review of Systems  Constitutional: Negative for fever, activity change, appetite change, fatigue and unexpected weight change.  Respiratory: Negative for cough, chest tightness, shortness of breath and wheezing.   Cardiovascular: Positive for leg swelling. Negative for chest pain and palpitations.  Gastrointestinal: Negative for abdominal pain, diarrhea, constipation and abdominal distention.  Musculoskeletal: Positive for myalgias and arthralgias. Negative for back pain and gait problem.  Skin: Negative.   Neurological: Negative.   Psychiatric/Behavioral: Negative.       Objective:   Physical Exam  Constitutional: She is oriented to person, place, and time. She appears well-developed and well-nourished.  HENT:  Head: Normocephalic and atraumatic.  Eyes: EOM are normal.  Neck: Normal range of motion.  Cardiovascular: Normal rate and regular rhythm.   Pulmonary/Chest: Effort normal and breath sounds normal.  Abdominal: Soft. She exhibits no distension. There is no tenderness. There is no rebound.  Musculoskeletal: She exhibits edema.  1-2+ edema to mid shins bilaterally without color change, no cellulitis.   Neurological: She is alert and oriented to  person, place, and time. Coordination normal.  Skin: Skin is warm and dry.   Filed Vitals:   07/19/15 1027  BP: 136/62  Pulse: 59  Temp: 98 F (36.7 C)  TempSrc: Oral  Resp: 14  Height: 5\' 6"  (1.676 m)  Weight: 189 lb (85.73 kg)  SpO2: 98%      Assessment & Plan:

## 2015-07-19 NOTE — Telephone Encounter (Signed)
I called and cleared up any confusion between the two different visit types.

## 2015-07-19 NOTE — Telephone Encounter (Signed)
Ok. Thank you.

## 2015-07-19 NOTE — Progress Notes (Signed)
Carelink Summary Report / Loop Recorder 

## 2015-07-19 NOTE — Telephone Encounter (Signed)
Pt husband called wanting to know what's the difference in what was done today than what will be done on 07/26/2015 which is her physical? Call pt @ [phone]. Thank you!

## 2015-07-19 NOTE — Assessment & Plan Note (Signed)
Stable and does not bother her so will keep her off lasix. Reminded her that the compression stockings would help and she could try putting them on over socks to prevent the rash but she declines at this time.

## 2015-07-19 NOTE — Assessment & Plan Note (Signed)
She would like to continue on amlodipine which is fine and also lisinopril. She is not taking lasix anymore as it was not helping. BP at goal and no adjustment today.

## 2015-07-19 NOTE — Progress Notes (Signed)
Pre visit review using our clinic review tool, if applicable. No additional management support is needed unless otherwise documented below in the visit note. 

## 2015-07-26 ENCOUNTER — Encounter: Payer: Self-pay | Admitting: Internal Medicine

## 2015-07-26 ENCOUNTER — Ambulatory Visit (INDEPENDENT_AMBULATORY_CARE_PROVIDER_SITE_OTHER): Payer: Medicare Other | Admitting: Internal Medicine

## 2015-07-26 VITALS — BP 118/60 | HR 63 | Temp 97.8°F | Resp 18 | Ht 66.0 in | Wt 190.1 lb

## 2015-07-26 DIAGNOSIS — E785 Hyperlipidemia, unspecified: Secondary | ICD-10-CM

## 2015-07-26 DIAGNOSIS — Z0000 Encounter for general adult medical examination without abnormal findings: Secondary | ICD-10-CM | POA: Diagnosis not present

## 2015-07-26 DIAGNOSIS — E118 Type 2 diabetes mellitus with unspecified complications: Secondary | ICD-10-CM | POA: Diagnosis not present

## 2015-07-26 DIAGNOSIS — I10 Essential (primary) hypertension: Secondary | ICD-10-CM

## 2015-07-26 NOTE — Patient Instructions (Signed)
We will get the colonoscopy put in and you will get a call.   We have placed the order for the mammogram and ultrasound so if you do not hear about that call our office.   Health Maintenance, Female Adopting a healthy lifestyle and getting preventive care can go a long way to promote health and wellness. Talk with your health care provider about what schedule of regular examinations is right for you. This is a good chance for you to check in with your provider about disease prevention and staying healthy. In between checkups, there are plenty of things you can do on your own. Experts have done a lot of research about which lifestyle changes and preventive measures are most likely to keep you healthy. Ask your health care provider for more information. WEIGHT AND DIET  Eat a healthy diet  Be sure to include plenty of vegetables, fruits, low-fat dairy products, and lean protein.  Do not eat a lot of foods high in solid fats, added sugars, or salt.  Get regular exercise. This is one of the most important things you can do for your health.  Most adults should exercise for at least 150 minutes each week. The exercise should increase your heart rate and make you sweat (moderate-intensity exercise).  Most adults should also do strengthening exercises at least twice a week. This is in addition to the moderate-intensity exercise.  Maintain a healthy weight  Body mass index (BMI) is a measurement that can be used to identify possible weight problems. It estimates body fat based on height and weight. Your health care provider can help determine your BMI and help you achieve or maintain a healthy weight.  For females 40 years of age and older:   A BMI below 18.5 is considered underweight.  A BMI of 18.5 to 24.9 is normal.  A BMI of 25 to 29.9 is considered overweight.  A BMI of 30 and above is considered obese.  Watch levels of cholesterol and blood lipids  You should start having your blood  tested for lipids and cholesterol at 69 years of age, then have this test every 5 years.  You may need to have your cholesterol levels checked more often if:  Your lipid or cholesterol levels are high.  You are older than 69 years of age.  You are at high risk for heart disease.  CANCER SCREENING   Lung Cancer  Lung cancer screening is recommended for adults 1-59 years old who are at high risk for lung cancer because of a history of smoking.  A yearly low-dose CT scan of the lungs is recommended for people who:  Currently smoke.  Have quit within the past 15 years.  Have at least a 30-pack-year history of smoking. A pack year is smoking an average of one pack of cigarettes a day for 1 year.  Yearly screening should continue until it has been 15 years since you quit.  Yearly screening should stop if you develop a health problem that would prevent you from having lung cancer treatment.  Breast Cancer  Practice breast self-awareness. This means understanding how your breasts normally appear and feel.  It also means doing regular breast self-exams. Let your health care provider know about any changes, no matter how small.  If you are in your 20s or 30s, you should have a clinical breast exam (CBE) by a health care provider every 1-3 years as part of a regular health exam.  If you are 40  or older, have a CBE every year. Also consider having a breast X-ray (mammogram) every year.  If you have a family history of breast cancer, talk to your health care provider about genetic screening.  If you are at high risk for breast cancer, talk to your health care provider about having an MRI and a mammogram every year.  Breast cancer gene (BRCA) assessment is recommended for women who have family members with BRCA-related cancers. BRCA-related cancers include:  Breast.  Ovarian.  Tubal.  Peritoneal cancers.  Results of the assessment will determine the need for genetic counseling  and BRCA1 and BRCA2 testing. Cervical Cancer Your health care provider may recommend that you be screened regularly for cancer of the pelvic organs (ovaries, uterus, and vagina). This screening involves a pelvic examination, including checking for microscopic changes to the surface of your cervix (Pap test). You may be encouraged to have this screening done every 3 years, beginning at age 48.  For women ages 28-65, health care providers may recommend pelvic exams and Pap testing every 3 years, or they may recommend the Pap and pelvic exam, combined with testing for human papilloma virus (HPV), every 5 years. Some types of HPV increase your risk of cervical cancer. Testing for HPV may also be done on women of any age with unclear Pap test results.  Other health care providers may not recommend any screening for nonpregnant women who are considered low risk for pelvic cancer and who do not have symptoms. Ask your health care provider if a screening pelvic exam is right for you.  If you have had past treatment for cervical cancer or a condition that could lead to cancer, you need Pap tests and screening for cancer for at least 20 years after your treatment. If Pap tests have been discontinued, your risk factors (such as having a new sexual partner) need to be reassessed to determine if screening should resume. Some women have medical problems that increase the chance of getting cervical cancer. In these cases, your health care provider may recommend more frequent screening and Pap tests. Colorectal Cancer  This type of cancer can be detected and often prevented.  Routine colorectal cancer screening usually begins at 69 years of age and continues through 69 years of age.  Your health care provider may recommend screening at an earlier age if you have risk factors for colon cancer.  Your health care provider may also recommend using home test kits to check for hidden blood in the stool.  A small camera  at the end of a tube can be used to examine your colon directly (sigmoidoscopy or colonoscopy). This is done to check for the earliest forms of colorectal cancer.  Routine screening usually begins at age 75.  Direct examination of the colon should be repeated every 5-10 years through 69 years of age. However, you may need to be screened more often if early forms of precancerous polyps or small growths are found. Skin Cancer  Check your skin from head to toe regularly.  Tell your health care provider about any new moles or changes in moles, especially if there is a change in a mole's shape or color.  Also tell your health care provider if you have a mole that is larger than the size of a pencil eraser.  Always use sunscreen. Apply sunscreen liberally and repeatedly throughout the day.  Protect yourself by wearing long sleeves, pants, a wide-brimmed hat, and sunglasses whenever you are outside. HEART DISEASE,  DIABETES, AND HIGH BLOOD PRESSURE   High blood pressure causes heart disease and increases the risk of stroke. High blood pressure is more likely to develop in:  People who have blood pressure in the high end of the normal range (130-139/85-89 mm Hg).  People who are overweight or obese.  People who are African American.  If you are 18-39 years of age, have your blood pressure checked every 3-5 years. If you are 40 years of age or older, have your blood pressure checked every year. You should have your blood pressure measured twice--once when you are at a hospital or clinic, and once when you are not at a hospital or clinic. Record the average of the two measurements. To check your blood pressure when you are not at a hospital or clinic, you can use:  An automated blood pressure machine at a pharmacy.  A home blood pressure monitor.  If you are between 55 years and 79 years old, ask your health care provider if you should take aspirin to prevent strokes.  Have regular diabetes  screenings. This involves taking a blood sample to check your fasting blood sugar level.  If you are at a normal weight and have a low risk for diabetes, have this test once every three years after 69 years of age.  If you are overweight and have a high risk for diabetes, consider being tested at a younger age or more often. PREVENTING INFECTION  Hepatitis B  If you have a higher risk for hepatitis B, you should be screened for this virus. You are considered at high risk for hepatitis B if:  You were born in a country where hepatitis B is common. Ask your health care provider which countries are considered high risk.  Your parents were born in a high-risk country, and you have not been immunized against hepatitis B (hepatitis B vaccine).  You have HIV or AIDS.  You use needles to inject street drugs.  You live with someone who has hepatitis B.  You have had sex with someone who has hepatitis B.  You get hemodialysis treatment.  You take certain medicines for conditions, including cancer, organ transplantation, and autoimmune conditions. Hepatitis C  Blood testing is recommended for:  Everyone born from 1945 through 1965.  Anyone with known risk factors for hepatitis C. Sexually transmitted infections (STIs)  You should be screened for sexually transmitted infections (STIs) including gonorrhea and chlamydia if:  You are sexually active and are younger than 69 years of age.  You are older than 69 years of age and your health care provider tells you that you are at risk for this type of infection.  Your sexual activity has changed since you were last screened and you are at an increased risk for chlamydia or gonorrhea. Ask your health care provider if you are at risk.  If you do not have HIV, but are at risk, it may be recommended that you take a prescription medicine daily to prevent HIV infection. This is called pre-exposure prophylaxis (PrEP). You are considered at risk  if:  You are sexually active and do not regularly use condoms or know the HIV status of your partner(s).  You take drugs by injection.  You are sexually active with a partner who has HIV. Talk with your health care provider about whether you are at high risk of being infected with HIV. If you choose to begin PrEP, you should first be tested for HIV. You should then   be tested every 3 months for as long as you are taking PrEP.  PREGNANCY   If you are premenopausal and you may become pregnant, ask your health care provider about preconception counseling.  If you may become pregnant, take 400 to 800 micrograms (mcg) of folic acid every day.  If you want to prevent pregnancy, talk to your health care provider about birth control (contraception). OSTEOPOROSIS AND MENOPAUSE   Osteoporosis is a disease in which the bones lose minerals and strength with aging. This can result in serious bone fractures. Your risk for osteoporosis can be identified using a bone density scan.  If you are 80 years of age or older, or if you are at risk for osteoporosis and fractures, ask your health care provider if you should be screened.  Ask your health care provider whether you should take a calcium or vitamin D supplement to lower your risk for osteoporosis.  Menopause may have certain physical symptoms and risks.  Hormone replacement therapy may reduce some of these symptoms and risks. Talk to your health care provider about whether hormone replacement therapy is right for you.  HOME CARE INSTRUCTIONS   Schedule regular health, dental, and eye exams.  Stay current with your immunizations.   Do not use any tobacco products including cigarettes, chewing tobacco, or electronic cigarettes.  If you are pregnant, do not drink alcohol.  If you are breastfeeding, limit how much and how often you drink alcohol.  Limit alcohol intake to no more than 1 drink per day for nonpregnant women. One drink equals 12  ounces of beer, 5 ounces of wine, or 1 ounces of hard liquor.  Do not use street drugs.  Do not share needles.  Ask your health care provider for help if you need support or information about quitting drugs.  Tell your health care provider if you often feel depressed.  Tell your health care provider if you have ever been abused or do not feel safe at home.   This information is not intended to replace advice given to you by your health care provider. Make sure you discuss any questions you have with your health care provider.   Document Released: 10/02/2010 Document Revised: 04/09/2014 Document Reviewed: 02/18/2013 Elsevier Interactive Patient Education [employer].

## 2015-07-26 NOTE — Progress Notes (Signed)
Pre visit review using our clinic review tool, if applicable. No additional management support is needed unless otherwise documented below in the visit note. 

## 2015-07-27 ENCOUNTER — Other Ambulatory Visit: Payer: Self-pay | Admitting: Internal Medicine

## 2015-07-27 DIAGNOSIS — Z1231 Encounter for screening mammogram for malignant neoplasm of breast: Secondary | ICD-10-CM

## 2015-07-27 DIAGNOSIS — Z01810 Encounter for preprocedural cardiovascular examination: Secondary | ICD-10-CM | POA: Insufficient documentation

## 2015-07-27 DIAGNOSIS — Z0000 Encounter for general adult medical examination without abnormal findings: Secondary | ICD-10-CM | POA: Insufficient documentation

## 2015-07-27 NOTE — Assessment & Plan Note (Signed)
Ordered breast screening on right breast and [place] on the left (due to pain from event recorder she refuses mammogram on left) and referral placed for another colonoscopy. Declines any immunizations. Thinks she had a bone density test some years ago and will try to check records. She thinks that it was normal. If unable to find will she is she will repeat as she is due for repeat anyway. Counseled her on the need for routine exercise to help with her health. Given 10 year screening recommendations.

## 2015-07-27 NOTE — Assessment & Plan Note (Signed)
Recent HgA1c stable and no adjustment to the medicines, we talked about diet and she is still on metformin and lisinopril and statin. No adjustment today.

## 2015-07-27 NOTE — Assessment & Plan Note (Signed)
Lipid panel recently at goal with her pravastatin. No adjustment today.

## 2015-07-27 NOTE — Progress Notes (Signed)
   Subjective:    Patient ID: Tiffany Wiggins, female    DOB: 1945/07/28, 69 y.o.   MRN: [id number]  HPI Here for medicare wellness, no new complaints. Please see A/P for status and treatment of chronic medical problems.   Diet: DM since diabetic Physical activity: sedentary Depression/mood screen: negative Hearing: intact to whispered voice, some loss Visual acuity: grossly normal with lens, performs annual eye exam  ADLs: capable Fall risk: none Home safety: good Cognitive evaluation: intact to orientation, naming, recall and repetition EOL planning: adv directives discussed  I have personally reviewed and have noted 1. The patient's medical and social history - reviewed today no changes 2. Their use of alcohol, tobacco or illicit drugs 3. Their current medications and supplements 4. The patient's functional ability including ADL's, fall risks, home safety risks and hearing or visual impairment. 5. Diet and physical activities 6. Evidence for depression or mood disorders 7. Care team reviewed and updated (available in snapshot)  Review of Systems  Constitutional: Negative for fever, activity change, appetite change, fatigue and unexpected weight change.  HENT: Negative.   Eyes: Negative.   Respiratory: Negative for cough, chest tightness, shortness of breath and wheezing.   Cardiovascular: Positive for leg swelling. Negative for chest pain and palpitations.  Gastrointestinal: Negative for abdominal pain, diarrhea, constipation and abdominal distention.  Musculoskeletal: Positive for arthralgias. Negative for myalgias, back pain and gait problem.  Skin: Negative.   Neurological: Negative.   Psychiatric/Behavioral: Negative.       Objective:   Physical Exam  Constitutional: She is oriented to person, place, and time. She appears well-developed and well-nourished.  HENT:  Head: Normocephalic and atraumatic.  Eyes: EOM are normal.  Neck: Normal range of motion.    Cardiovascular: Normal rate and regular rhythm.   Carotids without bruit bilaterally  Pulmonary/Chest: Effort normal and breath sounds normal. No respiratory distress. She has no wheezes. She has no rales.  Abdominal: Soft. Bowel sounds are normal. She exhibits no distension. There is no tenderness. There is no rebound.  Musculoskeletal: She exhibits edema.  1-2+ edema to mid shins bilaterally without color change, no cellulitis.   Neurological: She is alert and oriented to person, place, and time. Coordination normal.  Skin: Skin is warm and dry.  Psychiatric: She has a normal mood and affect.   Filed Vitals:   07/26/15 1529  BP: 118/60  Pulse: 63  Temp: 97.8 F (36.6 C)  TempSrc: Oral  Resp: 18  Height: 5\' 6"  (1.676 m)  Weight: 190 lb 1.3 oz (86.22 kg)  SpO2: 97%      Assessment & Plan:

## 2015-07-27 NOTE — Assessment & Plan Note (Signed)
BP at goal on lisinopril, amlodipine,

## 2015-07-28 ENCOUNTER — Encounter: Payer: Self-pay | Admitting: Gastroenterology

## 2015-07-29 ENCOUNTER — Other Ambulatory Visit: Payer: Self-pay | Admitting: Internal Medicine

## 2015-07-30 ENCOUNTER — Other Ambulatory Visit: Payer: Self-pay | Admitting: Internal Medicine

## 2015-08-03 ENCOUNTER — Telehealth: Payer: Self-pay | Admitting: Internal Medicine

## 2015-08-03 DIAGNOSIS — N644 Mastodynia: Secondary | ICD-10-CM

## 2015-08-03 NOTE — Telephone Encounter (Signed)
She did not mention any pain. Can be changed to diagnostic for pain placed today. Ultrasound is not the same for screening for breast cancer and would only evaluate the pain. Recommend diagnostic mammogram.

## 2015-08-03 NOTE — Telephone Encounter (Signed)
Is this ok to change to an ultrasound?

## 2015-08-03 NOTE — Telephone Encounter (Signed)
Jennifer call from [facility] request to speak to the assistant about the order that Dr. Sharlet Salina put in. Pt is schedule for a screening mammogram but pt want to get the ultra sound instead due to pain on that breast. Please advise, jennifer said if Dr. Sharlet Salina ok for ultra sound she would need to change the order to diagnostic mammogram or ultra sound. Please call her back  [phone] ext [phone]

## 2015-08-03 NOTE — Telephone Encounter (Signed)
Left message on voice mail informing Tiffany Wiggins of Dr. Charlynne Cousins response to her request for an [place].

## 2015-08-05 ENCOUNTER — Encounter: Payer: Self-pay | Admitting: Internal Medicine

## 2015-08-05 ENCOUNTER — Telehealth: Payer: Self-pay | Admitting: Internal Medicine

## 2015-08-05 LAB — CUP PACEART REMOTE DEVICE CHECK: MDC IDC SESS DTM: 20170216233615

## 2015-08-05 NOTE — Telephone Encounter (Signed)
Pt request lab work result. She said its been over a week but have not heard anything. Please call her back

## 2015-08-08 ENCOUNTER — Telehealth: Payer: Self-pay | Admitting: Internal Medicine

## 2015-08-08 DIAGNOSIS — N644 Mastodynia: Secondary | ICD-10-CM

## 2015-08-08 NOTE — Telephone Encounter (Signed)
Pt informed letter sent pt request

## 2015-08-08 NOTE — Telephone Encounter (Signed)
Spouse has called back in regards.

## 2015-08-08 NOTE — Telephone Encounter (Signed)
Has order for diagnostic bilateral mammogram.  Needs order changed to tomo diagnostic code [phone] [phone].   Ultra sound order should be for right or left breast?  Order is for left but states pain is on right.  Ultra sound should be limited and not complete.

## 2015-08-08 NOTE — Telephone Encounter (Signed)
Can call back after 3pm

## 2015-08-09 ENCOUNTER — Ambulatory Visit
Admission: RE | Admit: 2015-08-09 | Discharge: 2015-08-09 | Disposition: A | Discharge status: Home / Self Care | Payer: Medicare Other | Source: Ambulatory Visit | Attending: Internal Medicine | Admitting: Internal Medicine

## 2015-08-09 DIAGNOSIS — Z1231 Encounter for screening mammogram for malignant neoplasm of breast: Secondary | ICD-10-CM | POA: Diagnosis not present

## 2015-08-10 ENCOUNTER — Ambulatory Visit (AMBULATORY_SURGERY_CENTER): Payer: Self-pay | Admitting: *Deleted

## 2015-08-10 VITALS — Ht 66.0 in | Wt 190.0 lb

## 2015-08-10 DIAGNOSIS — Z1211 Encounter for screening for malignant neoplasm of colon: Secondary | ICD-10-CM

## 2015-08-10 MED ORDER — NA SULFATE-K SULFATE-MG SULF 17.5-3.13-1.6 GM/177ML PO SOLN: ORAL | Status: DC

## 2015-08-10 NOTE — Progress Notes (Signed)
Patient denies any allergies to eggs or soy. Patient denies any problems with anesthesia/sedation. Patient denies any oxygen use at home and does not take any diet/weight loss medications. Patient declined EMMI education. Patient had Loop recorder placed in heart in 2015. Patient denies any heart related symptoms or problems. Patient is aware to call us if any new problems occur between pv and colonoscopy.

## 2015-08-11 ENCOUNTER — Encounter: Payer: Self-pay | Admitting: Gastroenterology

## 2015-08-16 DIAGNOSIS — H1045 Other chronic allergic conjunctivitis: Secondary | ICD-10-CM | POA: Diagnosis not present

## 2015-08-17 ENCOUNTER — Ambulatory Visit (INDEPENDENT_AMBULATORY_CARE_PROVIDER_SITE_OTHER): Payer: Medicare Other | Admitting: *Deleted

## 2015-08-17 ENCOUNTER — Other Ambulatory Visit: Payer: Self-pay | Admitting: Internal Medicine

## 2015-08-17 DIAGNOSIS — I639 Cerebral infarction, unspecified: Secondary | ICD-10-CM

## 2015-08-17 NOTE — Telephone Encounter (Signed)
Orders placed.

## 2015-08-17 NOTE — Telephone Encounter (Signed)
Left vm for jennifer

## 2015-08-17 NOTE — Progress Notes (Signed)
Carelink Summary Report / Loop Recorder 

## 2015-08-18 ENCOUNTER — Other Ambulatory Visit: Payer: Self-pay | Admitting: Internal Medicine

## 2015-08-18 NOTE — Telephone Encounter (Signed)
Should the patient still be taking this medication? It is not on her current med list and it appears that it was removed from her list by pcp at 07/19/15 office visit. Please advise. Thanks, MI

## 2015-08-22 NOTE — Telephone Encounter (Signed)
This is what's in Dr. Nathanial Millman note.   Essential hypertension - Hoyt Koch, MD at 07/19/2015 12:37 PM     Status: Written Related Problem: Essential hypertension   Expand All Collapse All   She would like to continue on amlodipine which is fine and also lisinopril. She is not taking lasix anymore as it was not helping. BP at goal and no adjustment today.             Chronic venous insufficiency - Hoyt Koch, MD at 07/19/2015 12:37 PM     Status: Written Related Problem: Chronic venous insufficiency   Expand All Collapse All   Stable and does not bother her so will keep her off lasix. Reminded her that the compression stockings would help and she could try putting them on over socks to prevent the rash but she declines at this time.         I would not refill.

## 2015-08-25 ENCOUNTER — Encounter: Payer: Self-pay | Admitting: Gastroenterology

## 2015-08-25 ENCOUNTER — Emergency Department (HOSPITAL_COMMUNITY): Payer: Medicare Other | Admitting: Anesthesiology

## 2015-08-25 ENCOUNTER — Inpatient Hospital Stay (HOSPITAL_COMMUNITY)
Admission: EM | Admit: 2015-08-25 | Discharge: 2015-09-01 | DRG: 330 | Disposition: A | Discharge status: Home Health | Payer: Medicare Other | Attending: General Surgery | Admitting: General Surgery

## 2015-08-25 ENCOUNTER — Emergency Department (HOSPITAL_COMMUNITY): Payer: Medicare Other

## 2015-08-25 ENCOUNTER — Encounter (HOSPITAL_COMMUNITY): Payer: Self-pay | Admitting: Emergency Medicine

## 2015-08-25 ENCOUNTER — Ambulatory Visit (AMBULATORY_SURGERY_CENTER): Payer: Medicare Other | Admitting: Gastroenterology

## 2015-08-25 ENCOUNTER — Encounter (HOSPITAL_COMMUNITY): Admission: EM | Disposition: A | Discharge status: Home Health | Payer: Self-pay | Source: Home / Self Care

## 2015-08-25 VITALS — BP 136/77 | HR 62 | Temp 96.6°F | Resp 18 | Ht 66.0 in | Wt 190.0 lb

## 2015-08-25 DIAGNOSIS — K3532 Acute appendicitis with perforation and localized peritonitis, without abscess: Secondary | ICD-10-CM | POA: Diagnosis present

## 2015-08-25 DIAGNOSIS — T8131XA Disruption of external operation (surgical) wound, not elsewhere classified, initial encounter: Secondary | ICD-10-CM | POA: Diagnosis not present

## 2015-08-25 DIAGNOSIS — I739 Peripheral vascular disease, unspecified: Secondary | ICD-10-CM | POA: Diagnosis present

## 2015-08-25 DIAGNOSIS — Z1211 Encounter for screening for malignant neoplasm of colon: Secondary | ICD-10-CM | POA: Diagnosis present

## 2015-08-25 DIAGNOSIS — K5730 Diverticulosis of large intestine without perforation or abscess without bleeding: Secondary | ICD-10-CM | POA: Diagnosis present

## 2015-08-25 DIAGNOSIS — Z8711 Personal history of peptic ulcer disease: Secondary | ICD-10-CM

## 2015-08-25 DIAGNOSIS — K9181 Other intraoperative complications of digestive system: Principal | ICD-10-CM | POA: Diagnosis present

## 2015-08-25 DIAGNOSIS — D72829 Elevated white blood cell count, unspecified: Secondary | ICD-10-CM | POA: Diagnosis not present

## 2015-08-25 DIAGNOSIS — D1339 Benign neoplasm of other parts of small intestine: Secondary | ICD-10-CM | POA: Diagnosis not present

## 2015-08-25 DIAGNOSIS — K9289 Other specified diseases of the digestive system: Secondary | ICD-10-CM | POA: Diagnosis not present

## 2015-08-25 DIAGNOSIS — K631 Perforation of intestine (nontraumatic): Secondary | ICD-10-CM

## 2015-08-25 DIAGNOSIS — E785 Hyperlipidemia, unspecified: Secondary | ICD-10-CM | POA: Diagnosis not present

## 2015-08-25 DIAGNOSIS — K660 Peritoneal adhesions (postprocedural) (postinfection): Secondary | ICD-10-CM | POA: Diagnosis not present

## 2015-08-25 DIAGNOSIS — E118 Type 2 diabetes mellitus with unspecified complications: Secondary | ICD-10-CM | POA: Diagnosis present

## 2015-08-25 DIAGNOSIS — D120 Benign neoplasm of cecum: Secondary | ICD-10-CM

## 2015-08-25 DIAGNOSIS — E1165 Type 2 diabetes mellitus with hyperglycemia: Secondary | ICD-10-CM | POA: Diagnosis present

## 2015-08-25 DIAGNOSIS — K567 Ileus, unspecified: Secondary | ICD-10-CM | POA: Diagnosis not present

## 2015-08-25 DIAGNOSIS — R110 Nausea: Secondary | ICD-10-CM

## 2015-08-25 DIAGNOSIS — Y92530 Ambulatory surgery center as the place of occurrence of the external cause: Secondary | ICD-10-CM | POA: Diagnosis present

## 2015-08-25 DIAGNOSIS — Z8673 Personal history of transient ischemic attack (TIA), and cerebral infarction without residual deficits: Secondary | ICD-10-CM

## 2015-08-25 DIAGNOSIS — M25511 Pain in right shoulder: Secondary | ICD-10-CM | POA: Diagnosis not present

## 2015-08-25 DIAGNOSIS — Y658 Other specified misadventures during surgical and medical care: Secondary | ICD-10-CM | POA: Diagnosis present

## 2015-08-25 DIAGNOSIS — Z7982 Long term (current) use of aspirin: Secondary | ICD-10-CM | POA: Diagnosis not present

## 2015-08-25 DIAGNOSIS — K668 Other specified disorders of peritoneum: Secondary | ICD-10-CM

## 2015-08-25 DIAGNOSIS — I10 Essential (primary) hypertension: Secondary | ICD-10-CM | POA: Diagnosis not present

## 2015-08-25 DIAGNOSIS — Z800 Family history of malignant neoplasm of digestive organs: Secondary | ICD-10-CM

## 2015-08-25 DIAGNOSIS — Z79899 Other long term (current) drug therapy: Secondary | ICD-10-CM

## 2015-08-25 DIAGNOSIS — K635 Polyp of colon: Secondary | ICD-10-CM | POA: Diagnosis not present

## 2015-08-25 DIAGNOSIS — Z87891 Personal history of nicotine dependence: Secondary | ICD-10-CM | POA: Diagnosis not present

## 2015-08-25 DIAGNOSIS — T8189XA Other complications of procedures, not elsewhere classified, initial encounter: Secondary | ICD-10-CM | POA: Diagnosis not present

## 2015-08-25 DIAGNOSIS — R1084 Generalized abdominal pain: Secondary | ICD-10-CM | POA: Diagnosis not present

## 2015-08-25 DIAGNOSIS — E119 Type 2 diabetes mellitus without complications: Secondary | ICD-10-CM | POA: Diagnosis not present

## 2015-08-25 DIAGNOSIS — R109 Unspecified abdominal pain: Secondary | ICD-10-CM | POA: Diagnosis not present

## 2015-08-25 DIAGNOSIS — R52 Pain, unspecified: Secondary | ICD-10-CM

## 2015-08-25 HISTORY — PX: LAPAROSCOPY: SHX197

## 2015-08-25 LAB — CBC WITH DIFFERENTIAL/PLATELET
BASOS PCT: 0 %
Basophils Absolute: 0 10*3/uL (ref 0.0–0.1)
Eosinophils Absolute: 0 10*3/uL (ref 0.0–0.7)
Eosinophils Relative: 0 %
HEMATOCRIT: 34.6 % — AB (ref 36.0–46.0)
HEMOGLOBIN: 11.2 g/dL — AB (ref 12.0–15.0)
LYMPHS ABS: 1.5 10*3/uL (ref 0.7–4.0)
LYMPHS PCT: 10 %
MCH: 30.9 pg (ref 26.0–34.0)
MCHC: 32.4 g/dL (ref 30.0–36.0)
MCV: 95.6 fL (ref 78.0–100.0)
MONO ABS: 0.5 10*3/uL (ref 0.1–1.0)
MONOS PCT: 4 %
NEUTROS ABS: 12.3 10*3/uL — AB (ref 1.7–7.7)
Neutrophils Relative %: 86 %
Platelets: 228 10*3/uL (ref 150–400)
RBC: 3.62 MIL/uL — ABNORMAL LOW (ref 3.87–5.11)
RDW: 13.3 % (ref 11.5–15.5)
WBC: 14.3 10*3/uL — ABNORMAL HIGH (ref 4.0–10.5)

## 2015-08-25 LAB — COMPREHENSIVE METABOLIC PANEL
ALK PHOS: 56 U/L (ref 38–126)
ALT: 16 U/L (ref 14–54)
ANION GAP: 6 (ref 5–15)
AST: 18 U/L (ref 15–41)
Albumin: 4.2 g/dL (ref 3.5–5.0)
BILIRUBIN TOTAL: 0.2 mg/dL — AB (ref 0.3–1.2)
BUN: 19 mg/dL (ref 6–20)
CALCIUM: 9.4 mg/dL (ref 8.9–10.3)
CO2: 25 mmol/L (ref 22–32)
Chloride: 113 mmol/L — ABNORMAL HIGH (ref 101–111)
Creatinine, Ser: 1.25 mg/dL — ABNORMAL HIGH (ref 0.44–1.00)
GFR calc Af Amer: 50 mL/min — ABNORMAL LOW (ref >60)
GFR, EST NON AFRICAN AMERICAN: 43 mL/min — AB (ref >60)
Glucose, Bld: 141 mg/dL — ABNORMAL HIGH (ref 65–99)
POTASSIUM: 5.3 mmol/L — AB (ref 3.5–5.1)
Sodium: 144 mmol/L (ref 135–145)
TOTAL PROTEIN: 6.7 g/dL (ref 6.5–8.1)

## 2015-08-25 LAB — GLUCOSE, CAPILLARY
GLUCOSE-CAPILLARY: 104 mg/dL — AB (ref 65–99)
GLUCOSE-CAPILLARY: 191 mg/dL — AB (ref 65–99)
Glucose-Capillary: 173 mg/dL — ABNORMAL HIGH (ref 65–99)

## 2015-08-25 LAB — I-STAT TROPONIN, ED: TROPONIN I, POC: 0 ng/mL (ref 0.00–0.08)

## 2015-08-25 LAB — D-DIMER, QUANTITATIVE (NOT AT ARMC): D DIMER QUANT: 1.02 ug{FEU}/mL — AB (ref 0.00–0.50)

## 2015-08-25 SURGERY — LAPAROSCOPY, DIAGNOSTIC
Anesthesia: General

## 2015-08-25 MED ORDER — ONDANSETRON HCL 4 MG/2ML IJ SOLN
4.0000 mg | Freq: Four times a day (QID) | INTRAMUSCULAR | Status: DC | PRN
  Administered 2015-08-26: 4 mg via INTRAVENOUS
  Filled 2015-08-25: qty 2

## 2015-08-25 MED ORDER — LACTATED RINGERS IV SOLN
INTRAVENOUS | Status: DC | PRN
  Administered 2015-08-25: 20:00:00 via INTRAVENOUS
  Administered 2015-08-25: 1000 mL

## 2015-08-25 MED ORDER — PROPOFOL 10 MG/ML IV BOLUS
INTRAVENOUS | Status: DC | PRN
  Administered 2015-08-25: 160 mg via INTRAVENOUS

## 2015-08-25 MED ORDER — FENTANYL CITRATE (PF) 100 MCG/2ML IJ SOLN
INTRAMUSCULAR | Status: DC | PRN
  Administered 2015-08-25 (×2): 50 ug via INTRAVENOUS
  Administered 2015-08-25: 100 ug via INTRAVENOUS
  Administered 2015-08-25 (×2): 50 ug via INTRAVENOUS
  Administered 2015-08-25: 100 ug via INTRAVENOUS

## 2015-08-25 MED ORDER — FENTANYL CITRATE (PF) 100 MCG/2ML IJ SOLN
INTRAMUSCULAR | Status: AC
  Filled 2015-08-25: qty 2

## 2015-08-25 MED ORDER — SODIUM CHLORIDE 0.9 % IV SOLN
4.0000 mg | Freq: Once | INTRAVENOUS | Status: AC
  Administered 2015-08-25: 4 mg via INTRAVENOUS

## 2015-08-25 MED ORDER — SUCCINYLCHOLINE CHLORIDE 20 MG/ML IJ SOLN
INTRAMUSCULAR | Status: DC | PRN
  Administered 2015-08-25: 100 mg via INTRAVENOUS

## 2015-08-25 MED ORDER — LIDOCAINE HCL (CARDIAC) 20 MG/ML IV SOLN
INTRAVENOUS | Status: AC
  Filled 2015-08-25: qty 5

## 2015-08-25 MED ORDER — FENTANYL CITRATE (PF) 100 MCG/2ML IJ SOLN
25.0000 ug | Freq: Once | INTRAMUSCULAR | Status: AC
  Administered 2015-08-25: 25 ug via INTRAVENOUS

## 2015-08-25 MED ORDER — HEPARIN SODIUM (PORCINE) 5000 UNIT/ML IJ SOLN
5000.0000 [IU] | Freq: Three times a day (TID) | INTRAMUSCULAR | Status: DC
  Administered 2015-08-26 – 2015-09-01 (×19): 5000 [IU] via SUBCUTANEOUS
  Filled 2015-08-25 (×22): qty 1

## 2015-08-25 MED ORDER — ROCURONIUM BROMIDE 100 MG/10ML IV SOLN
INTRAVENOUS | Status: DC | PRN
  Administered 2015-08-25: 40 mg via INTRAVENOUS

## 2015-08-25 MED ORDER — PROPOFOL 10 MG/ML IV BOLUS
INTRAVENOUS | Status: AC
  Filled 2015-08-25: qty 20

## 2015-08-25 MED ORDER — FENTANYL CITRATE (PF) 100 MCG/2ML IJ SOLN
25.0000 ug | INTRAMUSCULAR | Status: DC | PRN
  Administered 2015-08-25 (×2): 25 ug via INTRAVENOUS
  Administered 2015-08-25 (×2): 50 ug via INTRAVENOUS

## 2015-08-25 MED ORDER — FENTANYL CITRATE (PF) 100 MCG/2ML IJ SOLN
50.0000 ug | Freq: Once | INTRAMUSCULAR | Status: AC
  Administered 2015-08-25: 50 ug via INTRAVENOUS
  Filled 2015-08-25: qty 2

## 2015-08-25 MED ORDER — PIPERACILLIN-TAZOBACTAM 3.375 G IVPB
3.3750 g | Freq: Three times a day (TID) | INTRAVENOUS | Status: DC
  Administered 2015-08-25 – 2015-09-01 (×20): 3.375 g via INTRAVENOUS
  Filled 2015-08-25 (×22): qty 50

## 2015-08-25 MED ORDER — FENTANYL CITRATE (PF) 250 MCG/5ML IJ SOLN
INTRAMUSCULAR | Status: AC
  Filled 2015-08-25: qty 5

## 2015-08-25 MED ORDER — HYDRALAZINE HCL 20 MG/ML IJ SOLN: 10.0000 mg | INTRAMUSCULAR | Status: DC | PRN

## 2015-08-25 MED ORDER — MIDAZOLAM HCL 2 MG/2ML IJ SOLN
INTRAMUSCULAR | Status: AC
  Filled 2015-08-25: qty 2

## 2015-08-25 MED ORDER — EPHEDRINE SULFATE 50 MG/ML IJ SOLN
INTRAMUSCULAR | Status: DC | PRN
  Administered 2015-08-25: 10 mg via INTRAVENOUS
  Administered 2015-08-25 (×2): 5 mg via INTRAVENOUS
  Administered 2015-08-25: 10 mg via INTRAVENOUS
  Administered 2015-08-25: 5 mg via INTRAVENOUS

## 2015-08-25 MED ORDER — INSULIN ASPART 100 UNIT/ML ~~LOC~~ SOLN
0.0000 [IU] | Freq: Three times a day (TID) | SUBCUTANEOUS | Status: DC
  Administered 2015-08-26: 3 [IU] via SUBCUTANEOUS
  Administered 2015-08-27 (×2): 1 [IU] via SUBCUTANEOUS
  Administered 2015-08-27: 2 [IU] via SUBCUTANEOUS
  Administered 2015-08-28: 1 [IU] via SUBCUTANEOUS
  Administered 2015-08-28: 2 [IU] via SUBCUTANEOUS
  Administered 2015-08-28 – 2015-08-29 (×2): 1 [IU] via SUBCUTANEOUS
  Administered 2015-08-29: 2 [IU] via SUBCUTANEOUS
  Administered 2015-08-29 – 2015-08-30 (×2): 1 [IU] via SUBCUTANEOUS
  Administered 2015-08-30: 2 [IU] via SUBCUTANEOUS
  Administered 2015-08-30 – 2015-08-31 (×2): 1 [IU] via SUBCUTANEOUS
  Administered 2015-08-31 (×2): 2 [IU] via SUBCUTANEOUS

## 2015-08-25 MED ORDER — ONDANSETRON 4 MG PO TBDP: 4.0000 mg | ORAL_TABLET | Freq: Four times a day (QID) | ORAL | Status: DC | PRN

## 2015-08-25 MED ORDER — DEXAMETHASONE SODIUM PHOSPHATE 10 MG/ML IJ SOLN
INTRAMUSCULAR | Status: DC | PRN
  Administered 2015-08-25: 5 mg via INTRAVENOUS

## 2015-08-25 MED ORDER — BUPIVACAINE-EPINEPHRINE (PF) 0.25% -1:200000 IJ SOLN
INTRAMUSCULAR | Status: AC
  Filled 2015-08-25: qty 30

## 2015-08-25 MED ORDER — BUPIVACAINE-EPINEPHRINE 0.25% -1:200000 IJ SOLN
INTRAMUSCULAR | Status: DC | PRN
  Administered 2015-08-25: 5 mL

## 2015-08-25 MED ORDER — PIPERACILLIN-TAZOBACTAM 3.375 G IVPB
3.3750 g | Freq: Once | INTRAVENOUS | Status: AC
  Administered 2015-08-25: 3.375 g via INTRAVENOUS
  Filled 2015-08-25: qty 50

## 2015-08-25 MED ORDER — FENTANYL CITRATE (PF) 100 MCG/2ML IJ SOLN
25.0000 ug | INTRAMUSCULAR | Status: DC | PRN
  Administered 2015-08-26 (×3): 50 ug via INTRAVENOUS
  Administered 2015-08-26: 25 ug via INTRAVENOUS
  Filled 2015-08-25 (×4): qty 2

## 2015-08-25 MED ORDER — ONDANSETRON HCL 4 MG/2ML IJ SOLN
INTRAMUSCULAR | Status: DC | PRN
  Administered 2015-08-25 (×2): 4 mg via INTRAVENOUS

## 2015-08-25 MED ORDER — SUGAMMADEX SODIUM 200 MG/2ML IV SOLN
INTRAVENOUS | Status: DC | PRN
  Administered 2015-08-25: 200 mg via INTRAVENOUS

## 2015-08-25 MED ORDER — IOPAMIDOL (ISOVUE-370) INJECTION 76%
100.0000 mL | Freq: Once | INTRAVENOUS | Status: AC | PRN
  Administered 2015-08-25: 100 mL via INTRAVENOUS

## 2015-08-25 MED ORDER — KCL IN DEXTROSE-NACL 20-5-0.9 MEQ/L-%-% IV SOLN
INTRAVENOUS | Status: DC
  Administered 2015-08-25: 1000 mL via INTRAVENOUS
  Administered 2015-08-26 – 2015-08-29 (×4): via INTRAVENOUS
  Administered 2015-08-29: 75 mL/h via INTRAVENOUS
  Administered 2015-08-30 – 2015-09-01 (×3): via INTRAVENOUS
  Filled 2015-08-25 (×13): qty 1000

## 2015-08-25 MED ORDER — SODIUM CHLORIDE 0.9 % IV SOLN: 500.0000 mL | INTRAVENOUS | Status: DC

## 2015-08-25 MED ORDER — LACTATED RINGERS IV SOLN: INTRAVENOUS | Status: DC | PRN

## 2015-08-25 MED ORDER — LIDOCAINE HCL (CARDIAC) 20 MG/ML IV SOLN
INTRAVENOUS | Status: DC | PRN
  Administered 2015-08-25: 60 mg via INTRAVENOUS

## 2015-08-25 MED ORDER — SODIUM CHLORIDE 0.9 % IV SOLN
INTRAVENOUS | Status: DC | PRN
  Administered 2015-08-25: 20:00:00 via INTRAVENOUS

## 2015-08-25 MED ORDER — PANTOPRAZOLE SODIUM 40 MG IV SOLR
40.0000 mg | Freq: Every day | INTRAVENOUS | Status: DC
  Administered 2015-08-25 – 2015-08-30 (×6): 40 mg via INTRAVENOUS
  Filled 2015-08-25 (×7): qty 40

## 2015-08-25 SURGICAL SUPPLY — 42 items
APL SKNCLS STERI-STRIP NONHPOA (GAUZE/BANDAGES/DRESSINGS)
BENZOIN TINCTURE PRP APPL 2/3 (GAUZE/BANDAGES/DRESSINGS) IMPLANT
CLOSURE WOUND 1/2 X4 (GAUZE/BANDAGES/DRESSINGS)
COVER SURGICAL LIGHT HANDLE (MISCELLANEOUS) ×3 IMPLANT
DECANTER SPIKE VIAL GLASS SM (MISCELLANEOUS) IMPLANT
DRAPE LAPAROSCOPIC ABDOMINAL (DRAPES) ×3 IMPLANT
DRAPE WARM FLUID 44X44 (DRAPE) ×2 IMPLANT
ELECT REM PT RETURN 9FT ADLT (ELECTROSURGICAL) ×3
ELECTRODE REM PT RTRN 9FT ADLT (ELECTROSURGICAL) ×1 IMPLANT
GLOVE BIO SURGEON STRL SZ7.5 (GLOVE) ×14 IMPLANT
GLOVE BIOGEL PI IND STRL 7.0 (GLOVE) ×1 IMPLANT
GLOVE BIOGEL PI INDICATOR 7.0 (GLOVE) ×2
GOWN STRL REUS W/ TWL XL LVL3 (GOWN DISPOSABLE) ×1 IMPLANT
GOWN STRL REUS W/TWL LRG LVL3 (GOWN DISPOSABLE) ×3 IMPLANT
GOWN STRL REUS W/TWL XL LVL3 (GOWN DISPOSABLE) ×6 IMPLANT
KIT BASIN OR (CUSTOM PROCEDURE TRAY) ×3 IMPLANT
LIGASURE IMPACT 36 18CM CVD LR (INSTRUMENTS) ×2 IMPLANT
LIQUID BAND (GAUZE/BANDAGES/DRESSINGS) IMPLANT
RELOAD PROXIMATE 75MM BLUE (ENDOMECHANICALS) ×6 IMPLANT
RELOAD STAPLE 75 3.8 BLU REG (ENDOMECHANICALS) IMPLANT
SET IRRIG TUBING LAPAROSCOPIC (IRRIGATION / IRRIGATOR) ×3 IMPLANT
SHEARS HARMONIC ACE PLUS 36CM (ENDOMECHANICALS) ×2 IMPLANT
SOLUTION ANTI FOG 6CC (MISCELLANEOUS) ×3 IMPLANT
STAPLER GUN LINEAR PROX 60 (STAPLE) ×2 IMPLANT
STAPLER PROXIMATE 75MM BLUE (STAPLE) ×2 IMPLANT
STAPLER VISISTAT 35W (STAPLE) ×2 IMPLANT
STRIP CLOSURE SKIN 1/2X4 (GAUZE/BANDAGES/DRESSINGS) IMPLANT
SUT PDS AB 1 TP1 96 (SUTURE) ×4 IMPLANT
SUT SILK 2 0 (SUTURE) ×3
SUT SILK 2 0 SH CR/8 (SUTURE) ×4 IMPLANT
SUT SILK 2-0 18XBRD TIE 12 (SUTURE) IMPLANT
SUT VIC AB 4-0 PS2 27 (SUTURE) IMPLANT
TOWEL OR 17X26 10 PK STRL BLUE (TOWEL DISPOSABLE) ×3 IMPLANT
TRAY FOLEY W/METER SILVER 14FR (SET/KITS/TRAYS/PACK) ×2 IMPLANT
TRAY FOLEY W/METER SILVER 16FR (SET/KITS/TRAYS/PACK) IMPLANT
TRAY LAPAROSCOPIC (CUSTOM PROCEDURE TRAY) ×3 IMPLANT
TROCAR BLADELESS OPT 5 75 (ENDOMECHANICALS) ×4 IMPLANT
TROCAR XCEL BLUNT TIP 100MML (ENDOMECHANICALS) ×3 IMPLANT
TROCAR XCEL NON-BLD 11X100MML (ENDOMECHANICALS) IMPLANT
TROCAR XCEL UNIV SLVE 11M 100M (ENDOMECHANICALS) IMPLANT
TUBING INSUF HEATED (TUBING) ×3 IMPLANT
WATER STERILE IRR 1500ML POUR (IV SOLUTION) ×1 IMPLANT

## 2015-08-25 NOTE — Op Note (Signed)
[facility] Patient Name: Tiffany Wiggins Procedure Date: 08/25/2015 10:49 AM MRN: [id number] Endoscopist: Mauri Pole , MD Age: 70 Referring MD:  Date of Birth: [birth year] Gender: Female Procedure:                Colonoscopy Indications:              Screening for colorectal malignant neoplasm,                            Screening for colorectal malignant neoplasm (last                            colonoscopy was more than 10 years ago) Medicines:                Monitored Anesthesia Care Procedure:                Pre-Anesthesia Assessment:                           - Prior to the procedure, a History and Physical                            was performed, and patient medications and                            allergies were reviewed. The patient's tolerance of                            previous anesthesia was also reviewed. The risks                            and benefits of the procedure and the sedation                            options and risks were discussed with the patient.                            All questions were answered, and informed consent                            was obtained. Prior Anticoagulants: The patient has                            taken no previous anticoagulant or antiplatelet                            agents. ASA Grade Assessment: II - A patient with                            mild systemic disease. After reviewing the risks                            and benefits, the patient was deemed in  satisfactory condition to undergo the procedure.                           After obtaining informed consent, the colonoscope                            was passed under direct vision. Throughout the                            procedure, the patient's blood pressure, pulse, and                            oxygen saturations were monitored continuously. The                            Model CF-HQ190L [id number]) scope was  introduced                            through the anus and advanced to the the terminal                            ileum, with identification of the appendiceal                            orifice and IC valve. The colonoscopy was performed                            without difficulty. The patient tolerated the                            procedure well. The quality of the bowel                            preparation was good. The terminal ileum, ileocecal                            valve, appendiceal orifice, and rectum were                            photographed. Scope In: 11:00:10 AM Scope Out: 11:37:59 AM Scope Withdrawal Time: 0 hours 31 minutes 15 seconds  Total Procedure Duration: 0 hours 37 minutes 49 seconds  Findings:                 The perianal and digital rectal examinations were                            normal.                           A 15 mm polyp was found in the appendiceal orifice.                            The polyp was semi-sessile. Area was successfully  injected with 5 mL saline for a lift polypectomy.                            The polyp was removed with a hot snare at 20 watts.                            Resection and retrieval were complete. Two                            hemostatic clips were successfully placed.                           Multiple small and large-mouthed diverticula were                            found in the sigmoid colon and descending colon.                           Non-bleeding internal hemorrhoids were found during                            retroflexion. The hemorrhoids were medium-sized. Complications:            No immediate complications. Estimated Blood Loss:     Estimated blood loss was minimal. Impression:               - One 15 mm polyp at the appendiceal orifice,                            removed with a hot snare. Resected and retrieved.                            Injected. Clips were placed.                            - Diverticulosis in the sigmoid colon and in the                            descending colon.                           - Non-bleeding internal hemorrhoids. Recommendation:           - Patient has a contact number available for                            emergencies. The signs and symptoms of potential                            delayed complications were discussed with the                            patient. Return to normal activities tomorrow.  Written discharge instructions were provided to the                            patient.                           - Resume previous diet.                           - Continue present medications.                           - Await pathology results.                           - Repeat colonoscopy in 1 year for surveillance                            after piecemeal polypectomy. Mauri Pole, MD 08/25/2015 11:46:10 AM This report has been signed electronically.

## 2015-08-25 NOTE — Anesthesia Procedure Notes (Signed)
Procedure Name: Intubation Date/Time: 08/25/2015 8:44 PM Performed by: Freddie Breech Pre-anesthesia Checklist: Patient identified, Emergency Drugs available, Suction available, Patient being monitored and Timeout performed Patient Re-evaluated:Patient Re-evaluated prior to inductionOxygen Delivery Method: Circle system utilized Preoxygenation: Pre-oxygenation with 100% oxygen Intubation Type: IV induction and Rapid sequence Laryngoscope Size: Mac and 3 Grade View: Grade I Tube type: Oral Tube size: 7.5 mm Number of attempts: 1 Airway Equipment and Method: Patient positioned with wedge pillow and Stylet Placement Confirmation: ETT inserted through vocal cords under direct vision,  positive ETCO2,  CO2 detector and breath sounds checked- equal and bilateral Secured at: 22 cm Tube secured with: Tape Dental Injury: Teeth and Oropharynx as per pre-operative assessment

## 2015-08-25 NOTE — Op Note (Signed)
08/25/2015  9:50 PM  PATIENT:  Tiffany Wiggins  70 y.o. female  PRE-OPERATIVE DIAGNOSIS:  FREE AIR ABDOMEN  POST-OPERATIVE DIAGNOSIS:  free air in abdomen, perforation of cecum  PROCEDURE:  Procedure(s): EXPLORATORY LAPAROTOMY, ILEOCECECTOMY, PRIMARY ANASTOMOSIS, LYSIS OF ADHESIONS (N/A)  SURGEON:  Surgeon(s) and Role:    * Jovita Kussmaul, MD - Primary  PHYSICIAN ASSISTANT:   ASSISTANTS: none   ANESTHESIA:   general  EBL:  Total I/O In: 1000 [I.V.:1000] Out: -   BLOOD ADMINISTERED:none  DRAINS: none   LOCAL MEDICATIONS USED:  MARCAINE     SPECIMEN:  Source of Specimen:  terminal ileum and cecum with perforation after colonoscopy  DISPOSITION OF SPECIMEN:  PATHOLOGY  COUNTS:  YES  TOURNIQUET:  * No tourniquets in log *  DICTATION: .Dragon Dictation   After informed consent was obtained the patient was brought to the operating room and placed in the supine position on the operating table. After adequate induction of general anesthesia the patient's abdomen was prepped with ChloraPrep, allowed to dry, and draped in usual sterile manner. An appropriate timeout was performed. A site was chosen on the left upper quadrant access the abdominal cavity. This area was infiltrated with quarter percent Marcaine and a small stab incision was made with a 15 blade knife. A 5 mm Optiview port and camera were used to bluntly dissect to the layers of the abdominal wall until access was gained to the abdominal cavity under direct vision. The was then insufflated with carbon dioxide without difficulty. The camera was reinserted through the port and the abdomen was inspected. There was a large tear to the anterior wall of the cecum that was readily identifiable.This was not going to be able to be repaired with a single firing of the stapler so at this point the abdomen was opened with a midline incision. The incision was made with a 15 blade knife and carried through the skin and subcutaneous tissue  sharply with electrocautery. The linea alba was identified. The linea alba was incised with the cautery and the preperitoneal space was probed bluntly until the peritoneum was opened and Access was gained to the abdominal cavity. The rest of the incision was opened under direct vision with the electrocautery. There were some adhesions of omentum to the anterior abdominal wall that were taken down sharply with electrocautery.  I began by running the small bowel from the ligament of Treitz to the ileocecal valve. There was a loop of small bowel densely adherent to the sigmoid colon and this was taken down sharply with Metzenbaum scissors. It was not clear whether there could be a small serosal tear at this location so it was imbricated with 2-0 silk Lembert stitches. The right colon was then mobilized by incising its retroperitoneal attachments along the white line of Toldt. I was then able to bring the right and transverse colon up into the wound. I found an area above the tear in the right colon where the colon appeared healthy. The mesentery at this point was opened sharply with the electrocautery. A GIA-75 stapler was then placed across the colon at this point clamped and fired thereby dividing the colon between staple lines. A site on the terminal ileum was also identified where the bowel appeared healthy and the mesentery at this point was opened sharply with the electrocautery. A GIA-75 stapler was placed across the small bowel at this point clamped and fired thereby dividing the small bowel between staple lines. The mesentery to  the terminal ileum and cecum was taken down sharply with the LigaSure. The main right colonic vessel was clamped with a Kelly clampand divided with the LigaSure and tied with a 2-0 silk tie. The transverse and left and sigmoid colon appeared healthy.The distal small bowel and proximal colon approximated each other readily. A small opening was made with the electrocautery on the  antimesenteric border of each limb of small bowel and colon. Each limb of a GIA-75 stapler was then placed down the appropriate limb of small bowel or colon, clamped, and fired thereby creating a nice widely patent enteroenterostomy. The common opening was closed with a firing of the TA 60 stapler. The staple line was then imbricated with 2-0 silk Lembert stitches. 2 2-0 silk crotch stitches were also placed to protect the anastomosis. The mesenteric defect was also closed with 2-0 silk figure-of-eight stitches. The bowel was then placed gently back into the abdominal cavity. All gowns and gloves were changed. New drapes were applied. The was then irrigated with copious amounts of saline. The anastomosis was examined again and appeared healthy and intact with no tension.  The abdomen was then closed with  Running #1 double-stranded loop PDS sutures. The subcutaneous tissue was packed with Kerlix gauze. Sterile dressings were applied. The patient tolerated the procedure well. At the end of the case all needle sponge and instrument counts were correct.The patient was then awakened and taken to recovery in stable condition.  PLAN OF CARE: Admit to inpatient   PATIENT DISPOSITION:  PACU - hemodynamically stable.   Delay start of Pharmacological VTE agent (>24hrs) due to surgical blood loss or risk of bleeding: no

## 2015-08-25 NOTE — ED Notes (Signed)
RT called for ABG.

## 2015-08-25 NOTE — ED Notes (Signed)
OR RN notified pt in transit to OR with tech pending HCG bath. Pt's belongings taken by pts spouse.

## 2015-08-25 NOTE — ED Notes (Signed)
Bed: [id number] Expected date:  Expected time:  Means of arrival:  Comments: perf from endo

## 2015-08-25 NOTE — Transfer of Care (Addendum)
Immediate Anesthesia Transfer of Care Note  Patient: Tiffany Wiggins  Procedure(s) Performed: Procedure(s): EXPLORATORY LAPAROTOMY, ILEOCECECTOMY, PRIMARY ANASTOMOSIS, LYSIS OF ADHESIONS (N/A)  Patient Location: PACU  Anesthesia Type:General  Level of Consciousness:  sedated, patient cooperative and responds to stimulation  Airway & Oxygen Therapy:Patient Spontanous Breathing and Patient connected to nasal oxgen  Post-op Assessment:  Report given to PACU RN and Post -op Vital signs reviewed and stable  Post vital signs:  Reviewed and stable  Last Vitals:  Filed Vitals:   08/25/15 2213 08/25/15 2215  BP: 146/76 147/59  Pulse: 96 95  Temp:    Resp:  18    Complications: No apparent anesthesia complications

## 2015-08-25 NOTE — ED Notes (Signed)
Phlebotomy at bedside for lab draw. 

## 2015-08-25 NOTE — Patient Instructions (Addendum)
YOU HAD AN ENDOSCOPIC PROCEDURE TODAY AT [facility]:   Refer to the procedure report that was given to you for any specific questions about what was found during the examination.  If the procedure report does not answer your questions, please call your gastroenterologist to clarify.  If you requested that your care partner not be given the details of your procedure findings, then the procedure report has been included in a sealed envelope for you to review at your convenience later.  YOU SHOULD EXPECT: Some feelings of bloating in the abdomen. Passage of more gas than usual.  Walking can help get rid of the air that was put into your GI tract during the procedure and reduce the bloating. If you had a lower endoscopy (such as a colonoscopy or flexible sigmoidoscopy) you may notice spotting of blood in your stool or on the toilet paper. If you underwent a bowel prep for your procedure, you may not have a normal bowel movement for a few days.  Please Note:  You might notice some irritation and congestion in your nose or some drainage.  This is from the oxygen used during your procedure.  There is no need for concern and it should clear up in a day or so.  SYMPTOMS TO REPORT IMMEDIATELY:   Following lower endoscopy (colonoscopy or flexible sigmoidoscopy):  Excessive amounts of blood in the stool  Significant tenderness or worsening of abdominal pains  Swelling of the abdomen that is new, acute  Fever of 100F or higher   For urgent or emergent issues, a gastroenterologist can be reached at any hour by calling [phone].   DIET: Your first meal following the procedure should be a small meal and then it is ok to progress to your normal diet. Heavy or fried foods are harder to digest and may make you feel nauseous or bloated.  Likewise, meals heavy in dairy and vegetables can increase bloating.  Drink plenty of fluids but you should avoid alcoholic beverages for 24 hours. Increase  the fiber in your diet.  Drink plenty of water.  ACTIVITY:  You should plan to take it easy for the rest of today and you should NOT DRIVE or use heavy machinery until tomorrow (because of the sedation medicines used during the test).    FOLLOW UP: Our staff will call the number listed on your records the next business day following your procedure to check on you and address any questions or concerns that you may have regarding the information given to you following your procedure. If we do not reach you, we will leave a message.  However, if you are feeling well and you are not experiencing any problems, there is no need to return our call.  We will assume that you have returned to your regular daily activities without incident.  If any biopsies were taken you will be contacted by phone or by letter within the next 1-3 weeks.  Please call us at [phone] if you have not heard about the biopsies in 3 weeks.    SIGNATURES/CONFIDENTIALITY: You and/or your care partner have signed paperwork which will be entered into your electronic medical record.  These signatures attest to the fact that that the information above on your After Visit Summary has been reviewed and is understood.  Full responsibility of the confidentiality of this discharge information lies with you and/or your care-partner.  Read all of the handouts given to you by your recovery room nurse.  Call us if you have any questions or issues.  You do have two clips in your colon. We will give you a card.  No MRIS til passed.

## 2015-08-25 NOTE — ED Provider Notes (Signed)
CSN: [id number]     Arrival date & time 08/25/15  1453 History   First MD Initiated Contact with Patient 08/25/15 1502     Chief Complaint  Patient presents with  . Shoulder Pain    ALLISAN TVEDT is a 70 y.o. female who presents to the ED from [facility] complaining of right shoulder pain. The patient reports she had a routine colonoscopy performed under conscious sedation today by Dr. Silverio Decamp. She reports after waking up she began having right shoulder pain that radiates into her right lateral chest and up into her right neck. She reports her pain is worse with movement of her neck are trying to sit upright. She reports no worsening pain with manipulation of her right shoulder. No recent injuries or falls. She denies chest pain, shortness of breath, numbness, tingling or weakness. No previous right shoulder pain. No previous right shoulder injuries or surgeries. She reports she has been passing a small amount of gas. She feels her abdomen is tight, but denies pain to her abdomen. She denies fevers, coughing, shortness of breath, chest pain, palpitations, abdominal pain, nausea, vomiting, diarrhea, numbness, tingling or weakness.   Patient is a 70 y.o. female presenting with shoulder pain. The history is provided by the patient. No language interpreter was used.  Shoulder Pain Associated symptoms: no back pain, no fever and no neck pain     Past Medical History  Diagnosis Date  . Hyperglycemia   . Hyperlipidemia   . PUD (peptic ulcer disease)   . IBS (irritable bowel syndrome)   . Hypertension     PCP GAVE FOR HER  BLOOD PRESSURE  . Stroke ([place])     TIA  BACK IN  04/2004  . [id number]    Past Surgical History  Procedure Laterality Date  . Tonsillectomy    . Laparoscopy for fertility work up    . Abdominal hysterectomy    . Oophorectomy    . Tonsillectomy    . Lumbar laminectomy/decompression microdiscectomy  04/25/2011    Procedure: LUMBAR LAMINECTOMY/DECOMPRESSION  MICRODISCECTOMY;  Surgeon: [name], MD;  Location: [facility];  Service: Neurosurgery;  Laterality: Right;  Right Lumbar Four-Five Discectomy  . Loop recorder implant N/A 03/24/2014    Procedure: LOOP RECORDER IMPLANT;  Surgeon: Deboraha Sprang, MD;  Location: [facility] CATH LAB;  Service: Cardiovascular;  Laterality: N/A;  . Biopsy thyroid  Jan. 2016   Family History  Problem Relation Age of Onset  . Cancer Sister     lung  . Diabetes Sister   . Cancer Brother     lung  . Colon cancer Paternal Uncle    Social History  Substance Use Topics  . Smoking status: Former Smoker    Types: Cigarettes    Quit date: 04/02/2004  . Smokeless tobacco: Never Used  . Alcohol Use: 1.2 oz/week    2 Glasses of wine per week   OB History    No data available     Review of Systems  Constitutional: Negative for fever and chills.  HENT: Negative for congestion and sore throat.   Eyes: Negative for visual disturbance.  Respiratory: Negative for cough, shortness of breath and wheezing.   Cardiovascular: Negative for chest pain and palpitations.  Gastrointestinal: Positive for abdominal distention. Negative for nausea, vomiting, abdominal pain and diarrhea.  Genitourinary: Negative for dysuria and difficulty urinating.  Musculoskeletal: Positive for arthralgias. Negative for back pain and neck pain.  Skin: Negative for rash.  Neurological:  Negative for dizziness, light-headedness and headaches.      Allergies  Codeine and Morphine and related  Home Medications   Prior to Admission medications   Medication Sig Start Date End Date Taking? Authorizing Provider  acetaminophen (TYLENOL) 500 MG tablet Take 1,000 mg by mouth 2 (two) times daily.   Yes Historical Provider, MD  amLODipine (NORVASC) 10 MG tablet Take 10 mg by mouth daily.  05/16/15  Yes Historical Provider, MD  aspirin 325 MG tablet Take 325 mg by mouth daily.   Yes Historical Provider, MD  Coenzyme Q10 (COQ10) 100 MG CAPS Take 1  tablet by mouth daily.   Yes Historical Provider, MD  lisinopril (PRINIVIL,ZESTRIL) 40 MG tablet TAKE ONE TABLET BY MOUTH ONCE DAILY 08/17/15  Yes Hoyt Koch, MD  metFORMIN (GLUCOPHAGE) 500 MG tablet TAKE ONE TABLET BY MOUTH AT BEDTIME 07/29/15  Yes Hoyt Koch, MD  MULTIPLE VITAMIN PO Take 1 tablet by mouth daily.    Yes Historical Provider, MD  Multiple Vitamins-Minerals (VISION-VITE PRESERVE PO) Take 1 tablet by mouth 2 (two) times daily.   Yes Historical Provider, MD  omeprazole (PRILOSEC) 20 MG capsule TAKE ONE CAPSULE BY MOUTH ONCE DAILY 08/01/15  Yes Hoyt Koch, MD  pravastatin (PRAVACHOL) 40 MG tablet Take 1 tablet (40 mg total) by mouth daily. 07/19/15  Yes Hoyt Koch, MD  Vitamin D, Cholecalciferol, 1000 units TABS Take 1 tablet by mouth daily.   Yes Historical Provider, MD   BP 134/70 mmHg  Pulse 63  Temp(Src) 98.1 F (36.7 C) (Oral)  Resp 16  SpO2 98% Physical Exam  Constitutional: She appears well-developed and well-nourished. No distress.  Nontoxic appearing.  HENT:  Head: Normocephalic and atraumatic.  Mouth/Throat: Oropharynx is clear and moist.  Eyes: Conjunctivae are normal. Pupils are equal, round, and reactive to light. Right eye exhibits no discharge. Left eye exhibits no discharge.  Neck: Neck supple.  Cardiovascular: Normal rate, regular rhythm, normal heart sounds and intact distal pulses.  Exam reveals no gallop and no friction rub.   No murmur heard. Bilateral radial and posterior tibialis pulses are intact. Good capillary refill.   Pulmonary/Chest: Effort normal and breath sounds normal. No respiratory distress. She has no wheezes. She has no rales. She exhibits no tenderness.  Lungs clear to auscultation bilaterally. No chest wall tenderness to palpation.   Abdominal: Soft. Bowel sounds are normal. There is no tenderness. There is no guarding.  Abdomen is slightly firm.  Bowel sounds are hypoactive. No tenderness to palpation  of abdomen.   Musculoskeletal: She exhibits no edema or tenderness.  No lower extremity edema or tenderness. No bony point tenderness to her right shoulder. Good ROM of her right shoulder without pain. No UE deformity or edema.   Lymphadenopathy:    She has no cervical adenopathy.  Neurological: She is alert. Coordination normal.  Skin: Skin is warm and dry. No rash noted. She is not diaphoretic. No erythema. No pallor.  Psychiatric: She has a normal mood and affect. Her behavior is normal.  Nursing note and vitals reviewed.   ED Course  Procedures (including critical care time) Labs Review Labs Reviewed  CBC WITH DIFFERENTIAL/PLATELET - Abnormal; Notable for the following:    WBC 14.3 (*)    RBC 3.62 (*)    Hemoglobin 11.2 (*)    HCT 34.6 (*)    Neutro Abs 12.3 (*)    All other components within normal limits  COMPREHENSIVE METABOLIC PANEL - Abnormal; Notable  for the following:    Potassium 5.3 (*)    Chloride 113 (*)    Glucose, Bld 141 (*)    Creatinine, Ser 1.25 (*)    Total Bilirubin 0.2 (*)    GFR calc non Af Amer 43 (*)    GFR calc Af Amer 50 (*)    All other components within normal limits  D-DIMER, QUANTITATIVE (NOT AT [facility]) - Abnormal; Notable for the following:    D-Dimer, Quant 1.02 (*)    All other components within normal limits  I-STAT TROPOININ, ED    Imaging Review Dg Shoulder Right  08/25/2015  CLINICAL DATA:  Right shoulder pain following colonoscopy, no known injury, initial encounter EXAM: RIGHT SHOULDER - 2+ VIEW COMPARISON:  None. FINDINGS: No acute fracture or dislocation is noted. The underlying bony thorax is within normal limits. There is air beneath the right hemidiaphragm. Given the recent colonoscopy E the possibility of free air deserves consideration. Supine upright abdomen is recommended for further evaluation. IMPRESSION: No acute shoulder abnormality is noted. Questionable free air under the right hemidiaphragm. This may simply be related to  distended colon from the recent colonoscopy although perforation deserves consideration. The patient is scheduled have a CTA of the chest and this area will likely be covered on upcoming CT. These results were called by telephone at the time of interpretation on 08/25/2015 at 5:19 pm to [facility], [facility], who verbally acknowledged these results. Electronically Signed   By: Inez Catalina M.D.   On: 08/25/2015 17:22   Ct Angio Chest Pe W/cm &/or Wo Cm  08/25/2015  CLINICAL DATA:  Endoscopic polyp removal with severe right shoulder pain, right abdominal pain with abdominal distention EXAM: CT ANGIOGRAPHY CHEST CT ABDOMEN AND PELVIS WITH CONTRAST TECHNIQUE: Multidetector CT imaging of the chest was performed using the standard protocol during bolus administration of intravenous contrast. Multiplanar CT image reconstructions and MIPs were obtained to evaluate the vascular anatomy. Multidetector CT imaging of the abdomen and pelvis was performed using the standard protocol during bolus administration of intravenous contrast. CONTRAST:  63 mL Isovue 370 COMPARISON:  09/10/2011 FINDINGS: CTA CHEST FINDINGS The lungs are clear. Thoracic aorta shows calcification with no dissection or dilatation. No filling defects in the central pulmonary arteries. No pericardial or pleural effusion. No significant hilar or mediastinal adenopathy. No acute thoracic musculoskeletal findings. CT ABDOMEN and PELVIS FINDINGS There is massive pneumoperitoneum. Free air extends from the level of the diaphragm to just above the pubic symphysis, seen in the nondependent abdomen measuring up to 9 cm in diameter. There are also numerous small bubbles of free air throughout the abdomen and pelvis. There is a 7 sigmoid colon diverticulosis. There is no evidence of diverticulitis. The colon is relatively decompressed as is small bowel which appears normal as well. Stomach is normal. Liver and gallbladder are normal. Pancreas is normal. Spleen is  normal. Adrenal glands and kidneys are normal. There is calcification of the aortoiliac vessels. Bladder is normal. There are no acute musculoskeletal findings in the abdomen or pelvis. Review of the MIP images confirms the above findings. IMPRESSION: Massive volume of pneumoperitoneum. Site of presumed perforation not identified. Critical Value/emergent results were called by telephone at the time of interpretation on 08/25/2015 at 6:27 pm to Dr. Isla Pence , who verbally acknowledged these results. Electronically Signed   By: Skipper Cliche M.D.   On: 08/25/2015 18:27   Ct Abdomen Pelvis W Contrast  08/25/2015  CLINICAL DATA:  Endoscopic polyp removal  with severe right shoulder pain, right abdominal pain with abdominal distention EXAM: CT ANGIOGRAPHY CHEST CT ABDOMEN AND PELVIS WITH CONTRAST TECHNIQUE: Multidetector CT imaging of the chest was performed using the standard protocol during bolus administration of intravenous contrast. Multiplanar CT image reconstructions and MIPs were obtained to evaluate the vascular anatomy. Multidetector CT imaging of the abdomen and pelvis was performed using the standard protocol during bolus administration of intravenous contrast. CONTRAST:  63 mL Isovue 370 COMPARISON:  09/10/2011 FINDINGS: CTA CHEST FINDINGS The lungs are clear. Thoracic aorta shows calcification with no dissection or dilatation. No filling defects in the central pulmonary arteries. No pericardial or pleural effusion. No significant hilar or mediastinal adenopathy. No acute thoracic musculoskeletal findings. CT ABDOMEN and PELVIS FINDINGS There is massive pneumoperitoneum. Free air extends from the level of the diaphragm to just above the pubic symphysis, seen in the nondependent abdomen measuring up to 9 cm in diameter. There are also numerous small bubbles of free air throughout the abdomen and pelvis. There is a 7 sigmoid colon diverticulosis. There is no evidence of diverticulitis. The colon is  relatively decompressed as is small bowel which appears normal as well. Stomach is normal. Liver and gallbladder are normal. Pancreas is normal. Spleen is normal. Adrenal glands and kidneys are normal. There is calcification of the aortoiliac vessels. Bladder is normal. There are no acute musculoskeletal findings in the abdomen or pelvis. Review of the MIP images confirms the above findings. IMPRESSION: Massive volume of pneumoperitoneum. Site of presumed perforation not identified. Critical Value/emergent results were called by telephone at the time of interpretation on 08/25/2015 at 6:27 pm to Dr. Isla Pence , who verbally acknowledged these results. Electronically Signed   By: Skipper Cliche M.D.   On: 08/25/2015 18:27   I have personally reviewed and evaluated these images and lab results as part of my medical decision-making.   EKG Interpretation   Date/Time:  Thursday Aug 25 2015 15:39:03 EDT Ventricular Rate:  62 PR Interval:  146 QRS Duration: 89 QT Interval:  418 QTC Calculation: 424 R Axis:   6 Text Interpretation:  Sinus rhythm Low voltage, precordial leads Confirmed  by [facility] MD, JULIE ([id number]) on 08/25/2015 3:42:35 PM Also confirmed by  [facility] MD, JULIE ([id number]), editor Gilford Rile, CCT, [place] ([id number])  on  08/25/2015 3:57:41 PM      Filed Vitals:   08/25/15 1745 08/25/15 1820 08/25/15 1830 08/25/15 1920  BP:  129/117 137/57 134/70  Pulse: 51 52 56 63  Temp:      TempSrc:      Resp: 21 16 17 16   SpO2: 100% 100% 100% 98%     MDM   Meds given in ED:  Medications  piperacillin-tazobactam (ZOSYN) IVPB 3.375 g (3.375 g Intravenous New Bag/Given 08/25/15 1829)  fentaNYL (SUBLIMAZE) injection 50 mcg (50 mcg Intravenous Given 08/25/15 1532)  iopamidol (ISOVUE-370) 76 % injection 100 mL (100 mLs Intravenous Contrast Given 08/25/15 1750)    Current Discharge Medication List      Final diagnoses:  Pain  #1: Pneumoperitoneum.  Bowel perforation.  Right shoulder pain.    This is a 70 y.o. female who presents to the ED from [facility] complaining of right shoulder pain. The patient reports she had a routine colonoscopy performed under conscious sedation today by Dr. Silverio Decamp. She reports after waking up she began having right shoulder pain that radiates into her right lateral chest and up into her right neck. She reports her pain is worse with movement  of her neck are trying to sit upright. She reports no worsening pain with manipulation of her right shoulder. No recent injuries or falls. She denies chest pain, shortness of breath, numbness, tingling or weakness. No previous right shoulder pain. No previous right shoulder injuries or surgeries. She reports she has been passing a small amount of gas. She feels her abdomen is tight, but denies pain to her abdomen. On exam the patient is afebrile and non-toxic appearing. She has good range of motion of her right shoulder without pain. No bony point tenderness to her right shoulder. Patient's abdomen is slightly firm, and is nontender to palpation. She denies of her abdomen feeling tight but denies any abdominal pain. While intervening the patient she had a drop in her oxygen saturation to around 86% on room air. I placed the patient on 2 L via nasal cannula and her oxygen saturation maintained around 93%.  EKG showed sinus rhythm. Troponin is 0. CBC revealed a leukocytosis with a white count of 14,000. CMP for creatinine of 1.25 and potassium of 5.3. D-dimer was elevated at 1.02.  Radiology called and reported that there is possibly free air beneath the diaphragm on her shoulder x-ray. We will obtain CT angiogram of her chest due to elevated d-dimer. Radiologist suggested staying with the CTA and will go further if needed.   CT tech came to myself and reported that this all evidence of free air on her CT angiogram and I ordered CT abdomen and pelvis.  CT abdomen and pelvis revealed massive pneumoperitoneum.  I immediately  consulted with Dr. Marlou Starks who will be by to see the patient and take her emergently to the OR.  Zosyn was ordered.   At reevaluation the patient is resting comfortably. Abdomen is more distended than initial evaluation and has only slight tenderness to palpation. I explained test results and plan and they are in agreement.   I consulted with Dr. Loletha Carrow from [facility] who will be by to see the patient. I also spoke with Dr. Silverio Decamp who came by to see the patient.   The patient was taken to the OR for surgery.   This patient was discussed with and evaluated by Dr. Gilford Raid who agrees with assessment and plan.    CRITICAL CARE Performed by: Hanley Hays   Total critical care time: 45 minutes  Critical care time was exclusive of separately billable procedures and treating other patients.  Critical care was necessary to treat or prevent imminent or life-threatening deterioration.  Critical care was time spent personally by me on the following activities: development of treatment plan with patient and/or surrogate as well as nursing, discussions with consultants, evaluation of patient's response to treatment, examination of patient, obtaining history from patient or surrogate, ordering and performing treatments and interventions, ordering and review of laboratory studies, ordering and review of radiographic studies, pulse oximetry and re-evaluation of patient's condition.    Waynetta Pean, PA-C 08/25/15 2109  Isla Pence, MD 08/26/15 [id number]

## 2015-08-25 NOTE — H&P (Signed)
Tiffany Wiggins is an 70 y.o. female.   Chief Complaint: abdominal pain HPI: The patient is a 70yo wf who presents with abdominal pain and right shoulder pain after a colonoscopy this am. A polyp was removed from the right colon during the procedure. She immediately had pain in right shoulder afterward. CT shows a large amount of free air.  Past Medical History  Diagnosis Date  . Hyperglycemia   . Hyperlipidemia   . PUD (peptic ulcer disease)   . IBS (irritable bowel syndrome)   . Hypertension     PCP GAVE FOR HER  BLOOD PRESSURE  . Stroke ([place])     TIA  BACK IN  04/2004  . UVOZDGUY(403.4)     Past Surgical History  Procedure Laterality Date  . Tonsillectomy    . Laparoscopy for fertility work up    . Abdominal hysterectomy    . Oophorectomy    . Tonsillectomy    . Lumbar laminectomy/decompression microdiscectomy  04/25/2011    Procedure: LUMBAR LAMINECTOMY/DECOMPRESSION MICRODISCECTOMY;  Surgeon: [name], MD;  Location: [facility];  Service: Neurosurgery;  Laterality: Right;  Right Lumbar Four-Five Discectomy  . Loop recorder implant N/A 03/24/2014    Procedure: LOOP RECORDER IMPLANT;  Surgeon: Deboraha Sprang, MD;  Location: [facility] CATH LAB;  Service: Cardiovascular;  Laterality: N/A;  . Biopsy thyroid  Jan. 2016    Family History  Problem Relation Age of Onset  . Cancer Sister     lung  . Diabetes Sister   . Cancer Brother     lung  . Colon cancer Paternal Uncle    Social History:  reports that she quit smoking about 11 years ago. Her smoking use included Cigarettes. She has never used smokeless tobacco. She reports that she drinks about 1.2 oz of alcohol per week. She reports that she does not use illicit drugs.  Allergies:  Allergies  Allergen Reactions  . Codeine Nausea Only  . Morphine And Related Itching    Itching      (Not in a hospital admission)  Results for orders placed or performed during the hospital encounter of 08/25/15 (from the past 48  hour(s))  CBC with Differential     Status: Abnormal   Collection Time: 08/25/15  4:10 PM  Result Value Ref Range   WBC 14.3 (H) 4.0 - 10.5 K/uL   RBC 3.62 (L) 3.87 - 5.11 MIL/uL   Hemoglobin 11.2 (L) 12.0 - 15.0 g/dL   HCT 34.6 (L) 36.0 - 46.0 %   MCV 95.6 78.0 - 100.0 fL   MCH 30.9 26.0 - 34.0 pg   MCHC 32.4 30.0 - 36.0 g/dL   RDW 13.3 11.5 - 15.5 %   Platelets 228 150 - 400 K/uL   Neutrophils Relative % 86 %   Neutro Abs 12.3 (H) 1.7 - 7.7 K/uL   Lymphocytes Relative 10 %   Lymphs Abs 1.5 0.7 - 4.0 K/uL   Monocytes Relative 4 %   Monocytes Absolute 0.5 0.1 - 1.0 K/uL   Eosinophils Relative 0 %   Eosinophils Absolute 0.0 0.0 - 0.7 K/uL   Basophils Relative 0 %   Basophils Absolute 0.0 0.0 - 0.1 K/uL  Comprehensive metabolic panel     Status: Abnormal   Collection Time: 08/25/15  4:10 PM  Result Value Ref Range   Sodium 144 135 - 145 mmol/L   Potassium 5.3 (H) 3.5 - 5.1 mmol/L   Chloride 113 (H) 101 - 111 mmol/L  CO2 25 22 - 32 mmol/L   Glucose, Bld 141 (H) 65 - 99 mg/dL   BUN 19 6 - 20 mg/dL   Creatinine, Ser 1.25 (H) 0.44 - 1.00 mg/dL   Calcium 9.4 8.9 - 10.3 mg/dL   Total Protein 6.7 6.5 - 8.1 g/dL   Albumin 4.2 3.5 - 5.0 g/dL   AST 18 15 - 41 U/L   ALT 16 14 - 54 U/L   Alkaline Phosphatase 56 38 - 126 U/L   Total Bilirubin 0.2 (L) 0.3 - 1.2 mg/dL   GFR calc non Af Amer 43 (L) >60 mL/min   GFR calc Af Amer 50 (L) >60 mL/min    Comment: (NOTE) The eGFR has been calculated using the CKD EPI equation. This calculation has not been validated in all clinical situations. eGFR's persistently <60 mL/min signify possible Chronic Kidney Disease.    Anion gap 6 5 - 15  D-dimer, quantitative     Status: Abnormal   Collection Time: 08/25/15  4:10 PM  Result Value Ref Range   D-Dimer, Quant 1.02 (H) 0.00 - 0.50 ug/mL-FEU    Comment: (NOTE) At the manufacturer cut-off of 0.50 ug/mL FEU, this assay has been documented to exclude PE with a sensitivity and negative  predictive value of 97 to 99%.  At this time, this assay has not been approved by the FDA to exclude DVT/VTE. Results should be correlated with clinical presentation.   I-stat troponin, ED     Status: None   Collection Time: 08/25/15  4:19 PM  Result Value Ref Range   Troponin i, poc 0.00 0.00 - 0.08 ng/mL   Comment 3            Comment: Due to the release kinetics of cTnI, a negative result within the first hours of the onset of symptoms does not rule out myocardial infarction with certainty. If myocardial infarction is still suspected, repeat the test at appropriate intervals.    Dg Shoulder Right  08/25/2015  CLINICAL DATA:  Right shoulder pain following colonoscopy, no known injury, initial encounter EXAM: RIGHT SHOULDER - 2+ VIEW COMPARISON:  None. FINDINGS: No acute fracture or dislocation is noted. The underlying bony thorax is within normal limits. There is air beneath the right hemidiaphragm. Given the recent colonoscopy E the possibility of free air deserves consideration. Supine upright abdomen is recommended for further evaluation. IMPRESSION: No acute shoulder abnormality is noted. Questionable free air under the right hemidiaphragm. This may simply be related to distended colon from the recent colonoscopy although perforation deserves consideration. The patient is scheduled have a CTA of the chest and this area will likely be covered on upcoming CT. These results were called by telephone at the time of interpretation on 08/25/2015 at 5:19 pm to [facility], who verbally acknowledged these results. Electronically Signed   By: Inez Catalina M.D.   On: 08/25/2015 17:22   Ct Angio Chest Pe W/cm &/or Wo Cm  08/25/2015  CLINICAL DATA:  Endoscopic polyp removal with severe right shoulder pain, right abdominal pain with abdominal distention EXAM: CT ANGIOGRAPHY CHEST CT ABDOMEN AND PELVIS WITH CONTRAST TECHNIQUE: Multidetector CT imaging of the chest was performed using the standard  protocol during bolus administration of intravenous contrast. Multiplanar CT image reconstructions and MIPs were obtained to evaluate the vascular anatomy. Multidetector CT imaging of the abdomen and pelvis was performed using the standard protocol during bolus administration of intravenous contrast. CONTRAST:  63 mL Isovue 370 COMPARISON:  09/10/2011  FINDINGS: CTA CHEST FINDINGS The lungs are clear. Thoracic aorta shows calcification with no dissection or dilatation. No filling defects in the central pulmonary arteries. No pericardial or pleural effusion. No significant hilar or mediastinal adenopathy. No acute thoracic musculoskeletal findings. CT ABDOMEN and PELVIS FINDINGS There is massive pneumoperitoneum. Free air extends from the level of the diaphragm to just above the pubic symphysis, seen in the nondependent abdomen measuring up to 9 cm in diameter. There are also numerous small bubbles of free air throughout the abdomen and pelvis. There is a 7 sigmoid colon diverticulosis. There is no evidence of diverticulitis. The colon is relatively decompressed as is small bowel which appears normal as well. Stomach is normal. Liver and gallbladder are normal. Pancreas is normal. Spleen is normal. Adrenal glands and kidneys are normal. There is calcification of the aortoiliac vessels. Bladder is normal. There are no acute musculoskeletal findings in the abdomen or pelvis. Review of the MIP images confirms the above findings. IMPRESSION: Massive volume of pneumoperitoneum. Site of presumed perforation not identified. Critical Value/emergent results were called by telephone at the time of interpretation on 08/25/2015 at 6:27 pm to Dr. Isla Pence , who verbally acknowledged these results. Electronically Signed   By: Skipper Cliche M.D.   On: 08/25/2015 18:27   Ct Abdomen Pelvis W Contrast  08/25/2015  CLINICAL DATA:  Endoscopic polyp removal with severe right shoulder pain, right abdominal pain with abdominal  distention EXAM: CT ANGIOGRAPHY CHEST CT ABDOMEN AND PELVIS WITH CONTRAST TECHNIQUE: Multidetector CT imaging of the chest was performed using the standard protocol during bolus administration of intravenous contrast. Multiplanar CT image reconstructions and MIPs were obtained to evaluate the vascular anatomy. Multidetector CT imaging of the abdomen and pelvis was performed using the standard protocol during bolus administration of intravenous contrast. CONTRAST:  63 mL Isovue 370 COMPARISON:  09/10/2011 FINDINGS: CTA CHEST FINDINGS The lungs are clear. Thoracic aorta shows calcification with no dissection or dilatation. No filling defects in the central pulmonary arteries. No pericardial or pleural effusion. No significant hilar or mediastinal adenopathy. No acute thoracic musculoskeletal findings. CT ABDOMEN and PELVIS FINDINGS There is massive pneumoperitoneum. Free air extends from the level of the diaphragm to just above the pubic symphysis, seen in the nondependent abdomen measuring up to 9 cm in diameter. There are also numerous small bubbles of free air throughout the abdomen and pelvis. There is a 7 sigmoid colon diverticulosis. There is no evidence of diverticulitis. The colon is relatively decompressed as is small bowel which appears normal as well. Stomach is normal. Liver and gallbladder are normal. Pancreas is normal. Spleen is normal. Adrenal glands and kidneys are normal. There is calcification of the aortoiliac vessels. Bladder is normal. There are no acute musculoskeletal findings in the abdomen or pelvis. Review of the MIP images confirms the above findings. IMPRESSION: Massive volume of pneumoperitoneum. Site of presumed perforation not identified. Critical Value/emergent results were called by telephone at the time of interpretation on 08/25/2015 at 6:27 pm to Dr. Isla Pence , who verbally acknowledged these results. Electronically Signed   By: Skipper Cliche M.D.   On: 08/25/2015 18:27     Review of Systems  Constitutional: Negative.   HENT: Negative.   Eyes: Negative.   Respiratory: Negative.   Cardiovascular: Positive for chest pain.  Gastrointestinal: Positive for abdominal pain.  Genitourinary: Negative.   Musculoskeletal: Negative.   Skin: Negative.   Neurological: Negative.   Endo/Heme/Allergies: Negative.   Psychiatric/Behavioral: Negative.  Blood pressure 137/57, pulse 56, temperature 98.1 F (36.7 C), temperature source Oral, resp. rate 17, SpO2 100 %. Physical Exam  Constitutional: She is oriented to person, place, and time. She appears well-developed and well-nourished.  HENT:  Head: Normocephalic and atraumatic.  Eyes: Conjunctivae and EOM are normal. Pupils are equal, round, and reactive to light.  Neck: Normal range of motion. Neck supple.  Cardiovascular: Normal rate, regular rhythm and normal heart sounds.   Respiratory: Effort normal and breath sounds normal.  GI:  There is moderate diffuse tenderness and distension  Musculoskeletal: Normal range of motion.  Neurological: She is alert and oriented to person, place, and time.  Skin: Skin is warm and dry.  Psychiatric: She has a normal mood and affect. Her behavior is normal.     Assessment/Plan The patient appears to have a bowel perforation likely related to her colonoscopy this am. Because of the risk of sepsis I think she needs to be explored urgently and the perforation repaired or resected. I have discussed with her the risks and benefits of the surgery as well as some of the technical aspects including the possible need for an ostomy and she understands and wishes to proceed.  Merrie Roof, MD 08/25/2015, 7:13 PM

## 2015-08-25 NOTE — Progress Notes (Addendum)
Patient was admitted to the recovery room at 1141 am.  She presented with a distended abdomen and was in pain.  Levsin given x 2 without results.  Patient stated r side shoulder pain with 10 minutes of admission.   Dr. Silverio Decamp notifed of patient's pain and a verbal order for fentanyl 23mcg was given. Pain med given IV without any good results. Patient did have a 12 lead EKG before the medication and it showed a junctional rate.  Dr. Is aware. No chest pain, but the right shoulder pain persists except when she is lying on her r side.   Patient was still here at 12:30pm walking the halls to pass gas.   Dr. Silverio Decamp is aware that the patient is still here.  AThen, patient got nauseated and zofran 4 mg given IV. No more nausea noted. See recovery page for details.  Patient was on toilet and stated that she didn't feel right.  Patient was helped back to bed by Alphonsa Gin, RN.  Patient continues to sleep after complaining of r shoulder and side pain. She is now on her r side.   Dr. Silverio Decamp has been notified several times during this recovery room period.  At this time, she doesn't want to send the patient to the emergency room.   Patient still has her IV, and she is sleeping at this time 1400.  Patient remains in pain with r shoulder. EMS notified, and patient transferred to [facility] ER report given to lead nurse.  Zofran given earlier was diluted with 65ml NS before adm.Marland Kitchen

## 2015-08-25 NOTE — Progress Notes (Signed)
Pt back to bed from bathroom.  Per Dr Silverio Decamp, pt wishes to go home and will call back with any problems.  Once back to bed she states that she "just doesn't feel good".  Will continue to monitor pt.

## 2015-08-25 NOTE — ED Notes (Signed)
PA at bedside.

## 2015-08-25 NOTE — ED Notes (Signed)
MD at bedside. 

## 2015-08-25 NOTE — Progress Notes (Signed)
A and O x 3 Report yo Therapist, sports

## 2015-08-25 NOTE — Anesthesia Preprocedure Evaluation (Addendum)
Anesthesia Evaluation  Patient identified by MRN, date of birth, ID band Patient awake  General Assessment Comment:History noted. Pt examined. Chart reviewed.  Husband at bedside preop. CE  Airway Mallampati: II  TM Distance: >3 FB Neck ROM: Full    Dental   Pulmonary former smoker,    breath sounds clear to auscultation       Cardiovascular hypertension, + Peripheral Vascular Disease   Rhythm:Regular Rate:Normal     Neuro/Psych    GI/Hepatic Neg liver ROS, PUD,   Endo/Other  diabetes  Renal/GU negative Renal ROS     Musculoskeletal   Abdominal   Peds  Hematology   Anesthesia Other Findings   Reproductive/Obstetrics                            Anesthesia Physical Anesthesia Plan  ASA: III  Anesthesia Plan: General   Post-op Pain Management:    Induction: Intravenous, Rapid sequence and Cricoid pressure planned  Airway Management Planned: Oral ETT  Additional Equipment:   Intra-op Plan:   Post-operative Plan: Possible Post-op intubation/ventilation  Informed Consent: I have reviewed the patients History and Physical, chart, labs and discussed the procedure including the risks, benefits and alternatives for the proposed anesthesia with the patient or authorized representative who has indicated his/her understanding and acceptance.   Dental advisory given  Plan Discussed with: CRNA and Anesthesiologist  Anesthesia Plan Comments:         Anesthesia Quick Evaluation

## 2015-08-25 NOTE — Anesthesia Postprocedure Evaluation (Signed)
Anesthesia Post Note  Patient: Tiffany Wiggins  Procedure(s) Performed: Procedure(s) (LRB): EXPLORATORY LAPAROTOMY, ILEOCECECTOMY, PRIMARY ANASTOMOSIS, LYSIS OF ADHESIONS (N/A)  Patient location during evaluation: PACU Anesthesia Type: General Level of consciousness: awake Pain management: pain level controlled Vital Signs Assessment: post-procedure vital signs reviewed and stable Respiratory status: spontaneous breathing Anesthetic complications: no    Last Vitals:  Filed Vitals:   08/25/15 2213 08/25/15 2215  BP: 146/76 147/59  Pulse: 96 95  Temp: 36.7 C   Resp: 16 18    Last Pain:  Filed Vitals:   08/25/15 2238  PainSc: 7                  EDWARDS,Laquashia Mergenthaler

## 2015-08-25 NOTE — Progress Notes (Signed)
Called to room to assist during endoscopic procedure.  Patient ID and intended procedure confirmed with present staff. Received instructions for my participation in the procedure from the performing physician.  

## 2015-08-25 NOTE — ED Notes (Signed)
Pt from ENDO post Polyp removal went to recovery c/o severe right shoulder pain that radiates down right side with distended abdomen. Pain with movement.

## 2015-08-25 NOTE — Progress Notes (Signed)
Patient has a Loop Recorder implanted in left chest.

## 2015-08-25 NOTE — Progress Notes (Signed)
   Patient Name: Tiffany Wiggins Date of Encounter: 08/25/2015, 7:37 PM   72 yr F S/p colonoscopy with removal large polyp in the cecum (peri appendicial orifice) with persistent R shoulder pain, CT abd & pelvis showed large free air in the peritoneum   Objective  BP 134/70 mmHg  Pulse 63  Temp(Src) 98.1 F (36.7 C) (Oral)  Resp 16  SpO2 98%  Abdomen is distended, diminished bowel sounds, non tender    Assessment and Plan  Patient with free air in the peritoneum concerning for bowel perforation, possible at site of polypectomy vs baro trauma in the right colon vs left colon in the region of severe diverticulosis.  Plan for exploratory laparoscopy and possible repair this evening by Dr Colon Flattery , MD [phone] Mon-Fri 8a-5p [phone] after 5p, weekends, holidays

## 2015-08-25 NOTE — ED Notes (Signed)
Patient transported to X-ray 

## 2015-08-25 NOTE — ED Notes (Signed)
Patient transported to CT 

## 2015-08-26 ENCOUNTER — Encounter (HOSPITAL_COMMUNITY): Payer: Self-pay | Admitting: General Surgery

## 2015-08-26 DIAGNOSIS — K3532 Acute appendicitis with perforation and localized peritonitis, without abscess: Secondary | ICD-10-CM | POA: Diagnosis present

## 2015-08-26 LAB — CBC
HEMATOCRIT: 34.5 % — AB (ref 36.0–46.0)
HEMOGLOBIN: 11.1 g/dL — AB (ref 12.0–15.0)
MCH: 31.8 pg (ref 26.0–34.0)
MCHC: 32.2 g/dL (ref 30.0–36.0)
MCV: 98.9 fL (ref 78.0–100.0)
Platelets: 220 10*3/uL (ref 150–400)
RBC: 3.49 MIL/uL — AB (ref 3.87–5.11)
RDW: 13.7 % (ref 11.5–15.5)
WBC: 16.9 10*3/uL — AB (ref 4.0–10.5)

## 2015-08-26 LAB — BASIC METABOLIC PANEL
ANION GAP: 6 (ref 5–15)
BUN: 15 mg/dL (ref 6–20)
CHLORIDE: 112 mmol/L — AB (ref 101–111)
CO2: 22 mmol/L (ref 22–32)
Calcium: 8.6 mg/dL — ABNORMAL LOW (ref 8.9–10.3)
Creatinine, Ser: 1.26 mg/dL — ABNORMAL HIGH (ref 0.44–1.00)
GFR calc Af Amer: 50 mL/min — ABNORMAL LOW (ref >60)
GFR calc non Af Amer: 43 mL/min — ABNORMAL LOW (ref >60)
Glucose, Bld: 257 mg/dL — ABNORMAL HIGH (ref 65–99)
POTASSIUM: 4.7 mmol/L (ref 3.5–5.1)
SODIUM: 140 mmol/L (ref 135–145)

## 2015-08-26 LAB — GLUCOSE, CAPILLARY
Glucose-Capillary: 248 mg/dL — ABNORMAL HIGH (ref 65–99)
Glucose-Capillary: 155 mg/dL — ABNORMAL HIGH (ref 65–99)

## 2015-08-26 MED ORDER — LORAZEPAM 2 MG/ML IJ SOLN: 0.1250 mg | Freq: Three times a day (TID) | INTRAMUSCULAR | Status: DC | PRN

## 2015-08-26 MED ORDER — ACETAMINOPHEN 10 MG/ML IV SOLN
1000.0000 mg | Freq: Four times a day (QID) | INTRAVENOUS | Status: AC
  Administered 2015-08-26 – 2015-08-27 (×3): 1000 mg via INTRAVENOUS
  Filled 2015-08-26 (×4): qty 100

## 2015-08-26 MED ORDER — FENTANYL CITRATE (PF) 100 MCG/2ML IJ SOLN
25.0000 ug | INTRAMUSCULAR | Status: DC | PRN
  Administered 2015-08-26 – 2015-08-29 (×24): 50 ug via INTRAVENOUS
  Administered 2015-08-30 (×2): 25 ug via INTRAVENOUS
  Administered 2015-08-30: 50 ug via INTRAVENOUS
  Administered 2015-08-30: 25 ug via INTRAVENOUS
  Administered 2015-08-30: 50 ug via INTRAVENOUS
  Administered 2015-08-30: 25 ug via INTRAVENOUS
  Administered 2015-08-30 (×2): 50 ug via INTRAVENOUS
  Administered 2015-08-31 (×4): 25 ug via INTRAVENOUS
  Filled 2015-08-26 (×36): qty 2

## 2015-08-26 MED ORDER — PHENOL 1.4 % MT LIQD
1.0000 | OROMUCOSAL | Status: DC | PRN
  Filled 2015-08-26: qty 177

## 2015-08-26 NOTE — Progress Notes (Signed)
Checked laparotomy incision at bedside and changed wet to dry dressing. Patient extremely anxious during dressing change -might ativan, especially before dressing changes. Continue BID wet-to-dry dressings for now. May be a candidate for vac in the future.  Obie Dredge, PA-C [facility] Pager: [phone] Mon-Fri 7:00 am-4:30 pm Sat-Sun 7:00 am-11:30 am

## 2015-08-26 NOTE — Progress Notes (Signed)
Utilization review completed.  

## 2015-08-26 NOTE — Progress Notes (Signed)
S/p exlap with ileocecetomy and primary anastomosis and lysis of adhesions for pneumoperitoneum with cecal tear s/p polypectomy. Patient is feeling tired and was c/o dried mouth. Also c/o leakage around foley catheter. No significant abdominal pain or distension Discussed with surgery team and ok to start ice chips and they will consider clamping NG tube later if she is doing better. Will also consider DC foley Appreciate care provided by surgical team.  Damaris Hippo , MD [phone] Mon-Fri 8a-5p [phone] after 5p, weekends, holidays

## 2015-08-26 NOTE — Progress Notes (Signed)
[facility] Progress Note  1 Day Post-Op  Subjective: POD#1 ileocolectomy with anastomosis by Dr. Marlou Starks after finding free air in the abdomen following a colonoscopy earlier in the day on 08/25/15. Pt laying in bed in NAD. NG tube in place - pt complains of dry mouth and clear drainage from nose. Pain at rest is 3/10 and 5/10 with movement. Denies flatus or BM since surgery. Pt states that she got up out of bed with the nurse earlier this AM.   24h Foley: 1350 cc 24h NG: 400 cc, dark green  Objective: Vital signs in last 24 hours: Temp:  [96.6 F (35.9 C)-98.1 F (36.7 C)] 98.1 F (36.7 C) (05/26 0509) Pulse Rate:  [42-96] 85 (05/26 0509) Resp:  [13-27] 16 (05/26 0509) BP: (108-170)/(48-117) 136/60 mmHg (05/26 0509) SpO2:  [85 %-100 %] 95 % (05/26 0509) Weight:  [83.915 kg (185 lb)-86.183 kg (190 lb)] 83.915 kg (185 lb) (05/25 2346)    Intake/Output from previous day: 05/25 0701 - 05/26 0700 In: 2680 [I.V.:2680] Out: 1850 [Urine:1350; Emesis/NG output:400; Blood:100] Intake/Output this shift:   PE: Gen:  Alert, pleasant, in mild discomfort from NG tube Head: dry lips, NG in place in left nare; 300 cc in cannister, dark green Card:  RRR, no M/G/R heard  Pulm:  CTA, no W/R/R  Abd: Soft, tender, ND, hypoactive BS, no HSM, midline laparotomy incision C/D/I GU: foley in place, no leakage, no erythema Ext:  No erythema, edema, or tenderness  Lab Results:   Recent Labs  08/25/15 1610 08/26/15 0421  WBC 14.3* 16.9*  HGB 11.2* 11.1*  HCT 34.6* 34.5*  PLT 228 220   BMET  Recent Labs  08/25/15 1610 08/26/15 0421  NA 144 140  K 5.3* 4.7  CL 113* 112*  CO2 25 22  GLUCOSE 141* 257*  BUN 19 15  CREATININE 1.25* 1.26*  CALCIUM 9.4 8.6*   PT/INR No results for input(s): LABPROT, INR in the last 72 hours. CMP     Component Value Date/Time   NA 140 08/26/2015 0421   K 4.7 08/26/2015 0421   CL 112* 08/26/2015 0421   CO2 22 08/26/2015 0421   GLUCOSE 257*  08/26/2015 0421   BUN 15 08/26/2015 0421   CREATININE 1.26* 08/26/2015 0421   CALCIUM 8.6* 08/26/2015 0421   PROT 6.7 08/25/2015 1610   ALBUMIN 4.2 08/25/2015 1610   AST 18 08/25/2015 1610   ALT 16 08/25/2015 1610   ALKPHOS 56 08/25/2015 1610   BILITOT 0.2* 08/25/2015 1610   GFRNONAA 43* 08/26/2015 0421   GFRAA 50* 08/26/2015 0421   Lipase  No results found for: LIPASE     Studies/Results: Dg Shoulder Right  08/25/2015  CLINICAL DATA:  Right shoulder pain following colonoscopy, no known injury, initial encounter EXAM: RIGHT SHOULDER - 2+ VIEW COMPARISON:  None. FINDINGS: No acute fracture or dislocation is noted. The underlying bony thorax is within normal limits. There is air beneath the right hemidiaphragm. Given the recent colonoscopy E the possibility of free air deserves consideration. Supine upright abdomen is recommended for further evaluation. IMPRESSION: No acute shoulder abnormality is noted. Questionable free air under the right hemidiaphragm. This may simply be related to distended colon from the recent colonoscopy although perforation deserves consideration. The patient is scheduled have a CTA of the chest and this area will likely be covered on upcoming CT. These results were called by telephone at the time of interpretation on 08/25/2015 at 5:19 pm to Waynetta Pean, PA,  who verbally acknowledged these results. Electronically Signed   By: Inez Catalina M.D.   On: 08/25/2015 17:22   Ct Angio Chest Pe W/cm &/or Wo Cm  08/25/2015  CLINICAL DATA:  Endoscopic polyp removal with severe right shoulder pain, right abdominal pain with abdominal distention EXAM: CT ANGIOGRAPHY CHEST CT ABDOMEN AND PELVIS WITH CONTRAST TECHNIQUE: Multidetector CT imaging of the chest was performed using the standard protocol during bolus administration of intravenous contrast. Multiplanar CT image reconstructions and MIPs were obtained to evaluate the vascular anatomy. Multidetector CT imaging of the  abdomen and pelvis was performed using the standard protocol during bolus administration of intravenous contrast. CONTRAST:  63 mL Isovue 370 COMPARISON:  09/10/2011 FINDINGS: CTA CHEST FINDINGS The lungs are clear. Thoracic aorta shows calcification with no dissection or dilatation. No filling defects in the central pulmonary arteries. No pericardial or pleural effusion. No significant hilar or mediastinal adenopathy. No acute thoracic musculoskeletal findings. CT ABDOMEN and PELVIS FINDINGS There is massive pneumoperitoneum. Free air extends from the level of the diaphragm to just above the pubic symphysis, seen in the nondependent abdomen measuring up to 9 cm in diameter. There are also numerous small bubbles of free air throughout the abdomen and pelvis. There is a 7 sigmoid colon diverticulosis. There is no evidence of diverticulitis. The colon is relatively decompressed as is small bowel which appears normal as well. Stomach is normal. Liver and gallbladder are normal. Pancreas is normal. Spleen is normal. Adrenal glands and kidneys are normal. There is calcification of the aortoiliac vessels. Bladder is normal. There are no acute musculoskeletal findings in the abdomen or pelvis. Review of the MIP images confirms the above findings. IMPRESSION: Massive volume of pneumoperitoneum. Site of presumed perforation not identified. Critical Value/emergent results were called by telephone at the time of interpretation on 08/25/2015 at 6:27 pm to Dr. Isla Pence , who verbally acknowledged these results. Electronically Signed   By: Skipper Cliche M.D.   On: 08/25/2015 18:27   Ct Abdomen Pelvis W Contrast  08/25/2015  CLINICAL DATA:  Endoscopic polyp removal with severe right shoulder pain, right abdominal pain with abdominal distention EXAM: CT ANGIOGRAPHY CHEST CT ABDOMEN AND PELVIS WITH CONTRAST TECHNIQUE: Multidetector CT imaging of the chest was performed using the standard protocol during bolus  administration of intravenous contrast. Multiplanar CT image reconstructions and MIPs were obtained to evaluate the vascular anatomy. Multidetector CT imaging of the abdomen and pelvis was performed using the standard protocol during bolus administration of intravenous contrast. CONTRAST:  63 mL Isovue 370 COMPARISON:  09/10/2011 FINDINGS: CTA CHEST FINDINGS The lungs are clear. Thoracic aorta shows calcification with no dissection or dilatation. No filling defects in the central pulmonary arteries. No pericardial or pleural effusion. No significant hilar or mediastinal adenopathy. No acute thoracic musculoskeletal findings. CT ABDOMEN and PELVIS FINDINGS There is massive pneumoperitoneum. Free air extends from the level of the diaphragm to just above the pubic symphysis, seen in the nondependent abdomen measuring up to 9 cm in diameter. There are also numerous small bubbles of free air throughout the abdomen and pelvis. There is a 7 sigmoid colon diverticulosis. There is no evidence of diverticulitis. The colon is relatively decompressed as is small bowel which appears normal as well. Stomach is normal. Liver and gallbladder are normal. Pancreas is normal. Spleen is normal. Adrenal glands and kidneys are normal. There is calcification of the aortoiliac vessels. Bladder is normal. There are no acute musculoskeletal findings in the abdomen or pelvis. Review  of the MIP images confirms the above findings. IMPRESSION: Massive volume of pneumoperitoneum. Site of presumed perforation not identified. Critical Value/emergent results were called by telephone at the time of interpretation on 08/25/2015 at 6:27 pm to Dr. Isla Pence , who verbally acknowledged these results. Electronically Signed   By: Skipper Cliche M.D.   On: 08/25/2015 18:27    Anti-infectives: Anti-infectives    Start     Dose/Rate Route Frequency Ordered Stop   08/26/15 0000  piperacillin-tazobactam (ZOSYN) IVPB 3.375 g     3.375 g 12.5 mL/hr  over 240 Minutes Intravenous Every 8 hours 08/25/15 2344     08/25/15 1815  piperacillin-tazobactam (ZOSYN) IVPB 3.375 g     3.375 g 12.5 mL/hr over 240 Minutes Intravenous  Once 08/25/15 1808 08/25/15 2229       Assessment/Plan POD#1 S/p ileocolectomy with primary anastamosis by Dr. Marlou Starks 08/25/15 - Due to free air after colonoscopy on morning of 5/25.  - IVF, NGT, analgesics; will discuss clamping NG and starting clears with Dr. Johney Maine  - mobilize as tolerated  - Zosyn day 2  - Incentive Spirometry   - d/c foley on POD #2   FEN - IVF, NPO with possible clamping of NG and advancement to clears  DVT - SCD's, heparin TID Dispo - floor  Leukocytosis - 16.9 today Hyperglycemia - 248 mg/dL  Hypertension - 130/60 ; hydralazine PRN Hyperlipidemia  PMH PUD PMH CVA 2006   LOS: 1 day    Jill Alexanders , [facility] 08/26/2015, 8:57 AM Pager: [phone] Mon-Fri 7:00 am-4:30 pm Sat-Sun 7:00 am-11:30 am

## 2015-08-27 LAB — CUP PACEART REMOTE DEVICE CHECK: Date Time Interrogation Session: 20170318233730

## 2015-08-27 LAB — GLUCOSE, CAPILLARY
GLUCOSE-CAPILLARY: 142 mg/dL — AB (ref 65–99)
GLUCOSE-CAPILLARY: 141 mg/dL — AB (ref 65–99)
Glucose-Capillary: 133 mg/dL — ABNORMAL HIGH (ref 65–99)
Glucose-Capillary: 162 mg/dL — ABNORMAL HIGH (ref 65–99)

## 2015-08-27 MED ORDER — DIPHENHYDRAMINE HCL 50 MG/ML IJ SOLN: 12.5000 mg | Freq: Four times a day (QID) | INTRAMUSCULAR | Status: DC | PRN

## 2015-08-27 MED ORDER — LORAZEPAM 2 MG/ML IJ SOLN
0.1250 mg | Freq: Three times a day (TID) | INTRAMUSCULAR | Status: DC | PRN
  Administered 2015-08-27 – 2015-08-31 (×5): 0.25 mg via INTRAVENOUS
  Filled 2015-08-27 (×5): qty 1

## 2015-08-27 MED ORDER — MAGIC MOUTHWASH
15.0000 mL | Freq: Four times a day (QID) | ORAL | Status: DC | PRN
  Filled 2015-08-27: qty 15

## 2015-08-27 MED ORDER — BISACODYL 10 MG RE SUPP: 10.0000 mg | Freq: Two times a day (BID) | RECTAL | Status: DC | PRN

## 2015-08-27 MED ORDER — LIP MEDEX EX OINT
1.0000 | TOPICAL_OINTMENT | Freq: Two times a day (BID) | CUTANEOUS | Status: DC
Start: 2015-08-27 — End: 2015-09-01
  Administered 2015-08-27 – 2015-09-01 (×10): 1 via TOPICAL
  Filled 2015-08-27 (×2): qty 7

## 2015-08-27 MED ORDER — MENTHOL 3 MG MT LOZG: 1.0000 | LOZENGE | OROMUCOSAL | Status: DC | PRN

## 2015-08-27 MED ORDER — ALUM & MAG HYDROXIDE-SIMETH 200-200-20 MG/5ML PO SUSP: 30.0000 mL | Freq: Four times a day (QID) | ORAL | Status: DC | PRN

## 2015-08-27 MED ORDER — SODIUM CHLORIDE 0.9 % IV SOLN
25.0000 mg | Freq: Four times a day (QID) | INTRAVENOUS | Status: DC | PRN
  Filled 2015-08-27: qty 1

## 2015-08-27 MED ORDER — LACTATED RINGERS IV BOLUS (SEPSIS): 1000.0000 mL | Freq: Three times a day (TID) | INTRAVENOUS | Status: AC | PRN

## 2015-08-27 MED ORDER — METOPROLOL TARTRATE 5 MG/5ML IV SOLN: 5.0000 mg | Freq: Four times a day (QID) | INTRAVENOUS | Status: DC | PRN

## 2015-08-27 MED ORDER — METHOCARBAMOL 1000 MG/10ML IJ SOLN
1000.0000 mg | Freq: Four times a day (QID) | INTRAVENOUS | Status: DC | PRN
  Filled 2015-08-27: qty 10

## 2015-08-27 NOTE — Progress Notes (Signed)
Patient ID: Tiffany Wiggins, female   DOB: Sep 02, 1945, 70 y.o.   MRN: [id number] [facility] Progress Note:   2 Days Post-Op  Subjective: Mental status is alert and complaining of NG discomfort.  NG bottle is full.  No flatus or BM reported Objective: Vital signs in last 24 hours: Temp:  [97.8 F (36.6 C)-98.6 F (37 C)] 98.4 F (36.9 C) (05/27 0459) Pulse Rate:  [57-71] 65 (05/27 0459) Resp:  [16] 16 (05/27 0459) BP: (112-133)/(48-53) 133/48 mmHg (05/27 0459) SpO2:  [87 %-98 %] 97 % (05/27 0459)  Intake/Output from previous day: 05/26 0701 - 05/27 0700 In: 1967.5 [P.O.:60; I.V.:1907.5] Out: 1650 [Urine:1150; Emesis/NG output:500] Intake/Output this shift:    Physical Exam: Work of breathing is not labored.  Abdomen dressing over midline incision.  No BS noted.    Lab Results:  Results for orders placed or performed during the hospital encounter of 08/25/15 (from the past 48 hour(s))  CBC with Differential     Status: Abnormal   Collection Time: 08/25/15  4:10 PM  Result Value Ref Range   WBC 14.3 (H) 4.0 - 10.5 K/uL   RBC 3.62 (L) 3.87 - 5.11 MIL/uL   Hemoglobin 11.2 (L) 12.0 - 15.0 g/dL   HCT 34.6 (L) 36.0 - 46.0 %   MCV 95.6 78.0 - 100.0 fL   MCH 30.9 26.0 - 34.0 pg   MCHC 32.4 30.0 - 36.0 g/dL   RDW 13.3 11.5 - 15.5 %   Platelets 228 150 - 400 K/uL   Neutrophils Relative % 86 %   Neutro Abs 12.3 (H) 1.7 - 7.7 K/uL   Lymphocytes Relative 10 %   Lymphs Abs 1.5 0.7 - 4.0 K/uL   Monocytes Relative 4 %   Monocytes Absolute 0.5 0.1 - 1.0 K/uL   Eosinophils Relative 0 %   Eosinophils Absolute 0.0 0.0 - 0.7 K/uL   Basophils Relative 0 %   Basophils Absolute 0.0 0.0 - 0.1 K/uL  Comprehensive metabolic panel     Status: Abnormal   Collection Time: 08/25/15  4:10 PM  Result Value Ref Range   Sodium 144 135 - 145 mmol/L   Potassium 5.3 (H) 3.5 - 5.1 mmol/L   Chloride 113 (H) 101 - 111 mmol/L   CO2 25 22 - 32 mmol/L   Glucose, Bld 141 (H) 65 - 99 mg/dL   BUN  19 6 - 20 mg/dL   Creatinine, Ser 1.25 (H) 0.44 - 1.00 mg/dL   Calcium 9.4 8.9 - 10.3 mg/dL   Total Protein 6.7 6.5 - 8.1 g/dL   Albumin 4.2 3.5 - 5.0 g/dL   AST 18 15 - 41 U/L   ALT 16 14 - 54 U/L   Alkaline Phosphatase 56 38 - 126 U/L   Total Bilirubin 0.2 (L) 0.3 - 1.2 mg/dL   GFR calc non Af Amer 43 (L) >60 mL/min   GFR calc Af Amer 50 (L) >60 mL/min    Comment: (NOTE) The eGFR has been calculated using the CKD EPI equation. This calculation has not been validated in all clinical situations. eGFR's persistently <60 mL/min signify possible Chronic Kidney Disease.    Anion gap 6 5 - 15  D-dimer, quantitative     Status: Abnormal   Collection Time: 08/25/15  4:10 PM  Result Value Ref Range   D-Dimer, Quant 1.02 (H) 0.00 - 0.50 ug/mL-FEU    Comment: (NOTE) At the manufacturer cut-off of 0.50 ug/mL FEU, this assay has been documented  to exclude PE with a sensitivity and negative predictive value of 97 to 99%.  At this time, this assay has not been approved by the FDA to exclude DVT/VTE. Results should be correlated with clinical presentation.   I-stat troponin, ED     Status: None   Collection Time: 08/25/15  4:19 PM  Result Value Ref Range   Troponin i, poc 0.00 0.00 - 0.08 ng/mL   Comment 3            Comment: Due to the release kinetics of cTnI, a negative result within the first hours of the onset of symptoms does not rule out myocardial infarction with certainty. If myocardial infarction is still suspected, repeat the test at appropriate intervals.   Glucose, capillary     Status: Abnormal   Collection Time: 08/25/15 10:27 PM  Result Value Ref Range   Glucose-Capillary 173 (H) 65 - 99 mg/dL  Basic metabolic panel     Status: Abnormal   Collection Time: 08/26/15  4:21 AM  Result Value Ref Range   Sodium 140 135 - 145 mmol/L   Potassium 4.7 3.5 - 5.1 mmol/L   Chloride 112 (H) 101 - 111 mmol/L   CO2 22 22 - 32 mmol/L   Glucose, Bld 257 (H) 65 - 99 mg/dL   BUN 15  6 - 20 mg/dL   Creatinine, Ser 1.26 (H) 0.44 - 1.00 mg/dL   Calcium 8.6 (L) 8.9 - 10.3 mg/dL   GFR calc non Af Amer 43 (L) >60 mL/min   GFR calc Af Amer 50 (L) >60 mL/min    Comment: (NOTE) The eGFR has been calculated using the CKD EPI equation. This calculation has not been validated in all clinical situations. eGFR's persistently <60 mL/min signify possible Chronic Kidney Disease.    Anion gap 6 5 - 15  CBC     Status: Abnormal   Collection Time: 08/26/15  4:21 AM  Result Value Ref Range   WBC 16.9 (H) 4.0 - 10.5 K/uL   RBC 3.49 (L) 3.87 - 5.11 MIL/uL   Hemoglobin 11.1 (L) 12.0 - 15.0 g/dL   HCT 34.5 (L) 36.0 - 46.0 %   MCV 98.9 78.0 - 100.0 fL   MCH 31.8 26.0 - 34.0 pg   MCHC 32.2 30.0 - 36.0 g/dL   RDW 13.7 11.5 - 15.5 %   Platelets 220 150 - 400 K/uL  Glucose, capillary     Status: Abnormal   Collection Time: 08/26/15  8:58 AM  Result Value Ref Range   Glucose-Capillary 248 (H) 65 - 99 mg/dL  Glucose, capillary     Status: Abnormal   Collection Time: 08/26/15  9:22 PM  Result Value Ref Range   Glucose-Capillary 155 (H) 65 - 99 mg/dL  Glucose, capillary     Status: Abnormal   Collection Time: 08/27/15  8:10 AM  Result Value Ref Range   Glucose-Capillary 142 (H) 65 - 99 mg/dL    Radiology/Results: Dg Shoulder Right  08/25/2015  CLINICAL DATA:  Right shoulder pain following colonoscopy, no known injury, initial encounter EXAM: RIGHT SHOULDER - 2+ VIEW COMPARISON:  None. FINDINGS: No acute fracture or dislocation is noted. The underlying bony thorax is within normal limits. There is air beneath the right hemidiaphragm. Given the recent colonoscopy E the possibility of free air deserves consideration. Supine upright abdomen is recommended for further evaluation. IMPRESSION: No acute shoulder abnormality is noted. Questionable free air under the right hemidiaphragm. This may simply be related to  distended colon from the recent colonoscopy although perforation deserves  consideration. The patient is scheduled have a CTA of the chest and this area will likely be covered on upcoming CT. These results were called by telephone at the time of interpretation on 08/25/2015 at 5:19 pm to [facility], [facility], who verbally acknowledged these results. Electronically Signed   By: Inez Catalina M.D.   On: 08/25/2015 17:22   Ct Angio Chest Pe W/cm &/or Wo Cm  08/25/2015  CLINICAL DATA:  Endoscopic polyp removal with severe right shoulder pain, right abdominal pain with abdominal distention EXAM: CT ANGIOGRAPHY CHEST CT ABDOMEN AND PELVIS WITH CONTRAST TECHNIQUE: Multidetector CT imaging of the chest was performed using the standard protocol during bolus administration of intravenous contrast. Multiplanar CT image reconstructions and MIPs were obtained to evaluate the vascular anatomy. Multidetector CT imaging of the abdomen and pelvis was performed using the standard protocol during bolus administration of intravenous contrast. CONTRAST:  63 mL Isovue 370 COMPARISON:  09/10/2011 FINDINGS: CTA CHEST FINDINGS The lungs are clear. Thoracic aorta shows calcification with no dissection or dilatation. No filling defects in the central pulmonary arteries. No pericardial or pleural effusion. No significant hilar or mediastinal adenopathy. No acute thoracic musculoskeletal findings. CT ABDOMEN and PELVIS FINDINGS There is massive pneumoperitoneum. Free air extends from the level of the diaphragm to just above the pubic symphysis, seen in the nondependent abdomen measuring up to 9 cm in diameter. There are also numerous small bubbles of free air throughout the abdomen and pelvis. There is a 7 sigmoid colon diverticulosis. There is no evidence of diverticulitis. The colon is relatively decompressed as is small bowel which appears normal as well. Stomach is normal. Liver and gallbladder are normal. Pancreas is normal. Spleen is normal. Adrenal glands and kidneys are normal. There is calcification of the  aortoiliac vessels. Bladder is normal. There are no acute musculoskeletal findings in the abdomen or pelvis. Review of the MIP images confirms the above findings. IMPRESSION: Massive volume of pneumoperitoneum. Site of presumed perforation not identified. Critical Value/emergent results were called by telephone at the time of interpretation on 08/25/2015 at 6:27 pm to Dr. Isla Pence , who verbally acknowledged these results. Electronically Signed   By: Skipper Cliche M.D.   On: 08/25/2015 18:27   Ct Abdomen Pelvis W Contrast  08/25/2015  CLINICAL DATA:  Endoscopic polyp removal with severe right shoulder pain, right abdominal pain with abdominal distention EXAM: CT ANGIOGRAPHY CHEST CT ABDOMEN AND PELVIS WITH CONTRAST TECHNIQUE: Multidetector CT imaging of the chest was performed using the standard protocol during bolus administration of intravenous contrast. Multiplanar CT image reconstructions and MIPs were obtained to evaluate the vascular anatomy. Multidetector CT imaging of the abdomen and pelvis was performed using the standard protocol during bolus administration of intravenous contrast. CONTRAST:  63 mL Isovue 370 COMPARISON:  09/10/2011 FINDINGS: CTA CHEST FINDINGS The lungs are clear. Thoracic aorta shows calcification with no dissection or dilatation. No filling defects in the central pulmonary arteries. No pericardial or pleural effusion. No significant hilar or mediastinal adenopathy. No acute thoracic musculoskeletal findings. CT ABDOMEN and PELVIS FINDINGS There is massive pneumoperitoneum. Free air extends from the level of the diaphragm to just above the pubic symphysis, seen in the nondependent abdomen measuring up to 9 cm in diameter. There are also numerous small bubbles of free air throughout the abdomen and pelvis. There is a 7 sigmoid colon diverticulosis. There is no evidence of diverticulitis. The colon is relatively decompressed as  is small bowel which appears normal as well. Stomach  is normal. Liver and gallbladder are normal. Pancreas is normal. Spleen is normal. Adrenal glands and kidneys are normal. There is calcification of the aortoiliac vessels. Bladder is normal. There are no acute musculoskeletal findings in the abdomen or pelvis. Review of the MIP images confirms the above findings. IMPRESSION: Massive volume of pneumoperitoneum. Site of presumed perforation not identified. Critical Value/emergent results were called by telephone at the time of interpretation on 08/25/2015 at 6:27 pm to Dr. Isla Pence , who verbally acknowledged these results. Electronically Signed   By: Skipper Cliche M.D.   On: 08/25/2015 18:27    Anti-infectives: Anti-infectives    Start     Dose/Rate Route Frequency Ordered Stop   08/26/15 0000  piperacillin-tazobactam (ZOSYN) IVPB 3.375 g     3.375 g 12.5 mL/hr over 240 Minutes Intravenous Every 8 hours 08/25/15 2344     08/25/15 1815  piperacillin-tazobactam (ZOSYN) IVPB 3.375 g     3.375 g 12.5 mL/hr over 240 Minutes Intravenous  Once 08/25/15 1808 08/25/15 2229      Assessment/Plan: Problem List: Patient Active Problem List   Diagnosis Date Noted  . Perforation of cecum s/p R hemicolectomy with anastamosis (08/25/2015) 08/26/2015  . Thoracic back pain 04/25/2015  . Chronic venous insufficiency 12/21/2014  . Hip arthritis 08/10/2014  . Breast pain, right 07/22/2014  . Thyroid nodule 05/01/2014  . PALPITATIONS, CHRONIC 12/08/2009  . HIP PAIN, BILATERAL 09/15/2009  . OVERWEIGHT 03/08/2008  . Cerebral artery occlusion with cerebral infarction ([place]) 03/08/2008  . Hyperlipemia 01/30/2007  . Essential hypertension 01/30/2007  . Diabetes mellitus type 2, controlled, with complications ([place]) 32/44/0102    Ileus postop.  Continue NG till passing flatus.   2 Days Post-Op    LOS: 2 days   Matt B. Hassell Done, MD, [facility] [phone] beeper [phone]  08/27/2015 10:24 AM

## 2015-08-27 NOTE — Progress Notes (Signed)
Carelink summary report received. Battery status OK. Normal device function. No new symptom episodes, tachy episodes, brady, or pause episodes. No new AF episodes. Monthly summary reports and ROV/PRN 

## 2015-08-28 LAB — CBC
HCT: 33.1 % — ABNORMAL LOW (ref 36.0–46.0)
Hemoglobin: 10.2 g/dL — ABNORMAL LOW (ref 12.0–15.0)
MCH: 31.2 pg (ref 26.0–34.0)
MCHC: 30.8 g/dL (ref 30.0–36.0)
MCV: 101.2 fL — ABNORMAL HIGH (ref 78.0–100.0)
PLATELETS: 208 10*3/uL (ref 150–400)
RBC: 3.27 MIL/uL — ABNORMAL LOW (ref 3.87–5.11)
RDW: 13.8 % (ref 11.5–15.5)
WBC: 12.4 10*3/uL — ABNORMAL HIGH (ref 4.0–10.5)

## 2015-08-28 LAB — BASIC METABOLIC PANEL
ANION GAP: 6 (ref 5–15)
BUN: 9 mg/dL (ref 6–20)
CHLORIDE: 112 mmol/L — AB (ref 101–111)
CO2: 29 mmol/L (ref 22–32)
CREATININE: 0.89 mg/dL (ref 0.44–1.00)
Calcium: 9.1 mg/dL (ref 8.9–10.3)
GFR calc non Af Amer: >60 mL/min (ref >60)
GLUCOSE: 154 mg/dL — AB (ref 65–99)
Potassium: 3.9 mmol/L (ref 3.5–5.1)
Sodium: 147 mmol/L — ABNORMAL HIGH (ref 135–145)

## 2015-08-28 LAB — GLUCOSE, CAPILLARY
GLUCOSE-CAPILLARY: 156 mg/dL — AB (ref 65–99)
GLUCOSE-CAPILLARY: 140 mg/dL — AB (ref 65–99)
Glucose-Capillary: 126 mg/dL — ABNORMAL HIGH (ref 65–99)

## 2015-08-28 NOTE — Progress Notes (Signed)
Patient ID: Tiffany Wiggins, female   DOB: 06-May-1945, 70 y.o.   MRN: [id number] [facility] Surgery Progress Note:   3 Days Post-Op  Subjective: Mental status is clear.  Complaining of NG irritation.   Objective: Vital signs in last 24 hours: Temp:  [97.9 F (36.6 C)-99 F (37.2 C)] 98.5 F (36.9 C) (05/28 0607) Pulse Rate:  [66-83] 71 (05/28 0607) Resp:  [17-18] 18 (05/28 0607) BP: (128-159)/(52-88) 144/60 mmHg (05/28 0607) SpO2:  [92 %-98 %] 94 % (05/28 0607)  Intake/Output from previous day: 05/27 0701 - 05/28 0700 In: 2400 [P.O.:540; I.V.:1500] Out: 2400 [Urine:1100; Emesis/NG output:1300] Intake/Output this shift: Total I/O In: 30 [Other:30] Out: 150 [Urine:150]  Physical Exam: Work of breathing is normal.  BS per nursing.  Wound looks good.  Redressed  Lab Results:  Results for orders placed or performed during the hospital encounter of 08/25/15 (from the past 48 hour(s))  Glucose, capillary     Status: Abnormal   Collection Time: 08/26/15  9:22 PM  Result Value Ref Range   Glucose-Capillary 155 (H) 65 - 99 mg/dL  Glucose, capillary     Status: Abnormal   Collection Time: 08/27/15  8:10 AM  Result Value Ref Range   Glucose-Capillary 142 (H) 65 - 99 mg/dL  Glucose, capillary     Status: Abnormal   Collection Time: 08/27/15 11:47 AM  Result Value Ref Range   Glucose-Capillary 133 (H) 65 - 99 mg/dL   Comment 1 Notify RN    Comment 2 Document in Chart   Glucose, capillary     Status: Abnormal   Collection Time: 08/27/15  4:53 PM  Result Value Ref Range   Glucose-Capillary 162 (H) 65 - 99 mg/dL  Glucose, capillary     Status: Abnormal   Collection Time: 08/27/15 11:29 PM  Result Value Ref Range   Glucose-Capillary 141 (H) 65 - 99 mg/dL  CBC     Status: Abnormal   Collection Time: 08/28/15  4:45 AM  Result Value Ref Range   WBC 12.4 (H) 4.0 - 10.5 K/uL   RBC 3.27 (L) 3.87 - 5.11 MIL/uL   Hemoglobin 10.2 (L) 12.0 - 15.0 g/dL   HCT 33.1 (L) 36.0 - 46.0 %   MCV 101.2 (H) 78.0 - 100.0 fL   MCH 31.2 26.0 - 34.0 pg   MCHC 30.8 30.0 - 36.0 g/dL   RDW 13.8 11.5 - 15.5 %   Platelets 208 150 - 400 K/uL  Basic metabolic panel     Status: Abnormal   Collection Time: 08/28/15  4:45 AM  Result Value Ref Range   Sodium 147 (H) 135 - 145 mmol/L   Potassium 3.9 3.5 - 5.1 mmol/L   Chloride 112 (H) 101 - 111 mmol/L   CO2 29 22 - 32 mmol/L   Glucose, Bld 154 (H) 65 - 99 mg/dL   BUN 9 6 - 20 mg/dL   Creatinine, Ser 0.89 0.44 - 1.00 mg/dL   Calcium 9.1 8.9 - 10.3 mg/dL   GFR calc non Af Amer >60 >60 mL/min   GFR calc Af Amer >60 >60 mL/min    Comment: (NOTE) The eGFR has been calculated using the CKD EPI equation. This calculation has not been validated in all clinical situations. eGFR's persistently <60 mL/min signify possible Chronic Kidney Disease.    Anion gap 6 5 - 15  Glucose, capillary     Status: Abnormal   Collection Time: 08/28/15  7:59 AM  Result Value Ref Range  Glucose-Capillary 156 (H) 65 - 99 mg/dL    Radiology/Results: No results found.  Anti-infectives: Anti-infectives    Start     Dose/Rate Route Frequency Ordered Stop   08/26/15 0000  piperacillin-tazobactam (ZOSYN) IVPB 3.375 g     3.375 g 12.5 mL/hr over 240 Minutes Intravenous Every 8 hours 08/25/15 2344     08/25/15 1815  piperacillin-tazobactam (ZOSYN) IVPB 3.375 g     3.375 g 12.5 mL/hr over 240 Minutes Intravenous  Once 08/25/15 1808 08/25/15 2229      Assessment/Plan: Problem List: Patient Active Problem List   Diagnosis Date Noted  . Perforation of cecum s/p R hemicolectomy with anastamosis (08/25/2015) 08/26/2015  . Thoracic back pain 04/25/2015  . Chronic venous insufficiency 12/21/2014  . Hip arthritis 08/10/2014  . Breast pain, right 07/22/2014  . Thyroid nodule 05/01/2014  . PALPITATIONS, CHRONIC 12/08/2009  . HIP PAIN, BILATERAL 09/15/2009  . OVERWEIGHT 03/08/2008  . Cerebral artery occlusion with cerebral infarction ([place]) 03/08/2008  .  Hyperlipemia 01/30/2007  . Essential hypertension 01/30/2007  . Diabetes mellitus type 2, controlled, with complications ([place]) 63/84/6659    Will discontinue NG and give popsicles and ice;   3 Days Post-Op    LOS: 3 days   Matt B. Hassell Done, MD, [facility] [phone] beeper [phone]  08/28/2015 9:12 AM

## 2015-08-28 NOTE — Progress Notes (Signed)
Pt very concerned about state of health. Asked numerous times throughout the shift "Am I ever going to feel better?" RN assured her that she will eventually feel better. Pt hopes to "get some answers" today and feels that she does not know what will happen to her. Pt continues to have bleeding from nose r/t NG tube. Auscultated and still in stomach. Will continue to monitor.

## 2015-08-29 LAB — CUP PACEART REMOTE DEVICE CHECK: Date Time Interrogation Session: 20170418001100

## 2015-08-29 LAB — GLUCOSE, CAPILLARY
GLUCOSE-CAPILLARY: 125 mg/dL — AB (ref 65–99)
Glucose-Capillary: 147 mg/dL — ABNORMAL HIGH (ref 65–99)
Glucose-Capillary: 165 mg/dL — ABNORMAL HIGH (ref 65–99)

## 2015-08-29 NOTE — Progress Notes (Signed)
Patient ID: Tiffany Wiggins, female   DOB: Aug 03, 1945, 70 y.o.   MRN: [id number] [facility] Progress Note:   4 Days Post-Op  Subjective: Mental status is more clear. Objective: Vital signs in last 24 hours: Temp:  [98 F (36.7 C)-99.5 F (37.5 C)] 98 F (36.7 C) (05/29 0529) Pulse Rate:  [56-64] 56 (05/29 0529) Resp:  [15-16] 16 (05/29 0529) BP: (140-144)/(46-56) 140/53 mmHg (05/29 0529) SpO2:  [98 %-100 %] 98 % (05/29 0529)  Intake/Output from previous day: 05/28 0701 - 05/29 0700 In: 30  Out: 1200 [Urine:1200] Intake/Output this shift: Total I/O In: 120 [P.O.:120] Out: 350 [Urine:350]  Physical Exam: Work of breathing is not labored.  Up to bedside commode.  Incision being repacked by nursing.  Taking clears but not so robustly.    Lab Results:  Results for orders placed or performed during the hospital encounter of 08/25/15 (from the past 48 hour(s))  Glucose, capillary     Status: Abnormal   Collection Time: 08/27/15 11:47 AM  Result Value Ref Range   Glucose-Capillary 133 (H) 65 - 99 mg/dL   Comment 1 Notify RN    Comment 2 Document in Chart   Glucose, capillary     Status: Abnormal   Collection Time: 08/27/15  4:53 PM  Result Value Ref Range   Glucose-Capillary 162 (H) 65 - 99 mg/dL  Glucose, capillary     Status: Abnormal   Collection Time: 08/27/15 11:29 PM  Result Value Ref Range   Glucose-Capillary 141 (H) 65 - 99 mg/dL  CBC     Status: Abnormal   Collection Time: 08/28/15  4:45 AM  Result Value Ref Range   WBC 12.4 (H) 4.0 - 10.5 K/uL   RBC 3.27 (L) 3.87 - 5.11 MIL/uL   Hemoglobin 10.2 (L) 12.0 - 15.0 g/dL   HCT 33.1 (L) 36.0 - 46.0 %   MCV 101.2 (H) 78.0 - 100.0 fL   MCH 31.2 26.0 - 34.0 pg   MCHC 30.8 30.0 - 36.0 g/dL   RDW 13.8 11.5 - 15.5 %   Platelets 208 150 - 400 K/uL  Basic metabolic panel     Status: Abnormal   Collection Time: 08/28/15  4:45 AM  Result Value Ref Range   Sodium 147 (H) 135 - 145 mmol/L   Potassium 3.9 3.5 - 5.1  mmol/L   Chloride 112 (H) 101 - 111 mmol/L   CO2 29 22 - 32 mmol/L   Glucose, Bld 154 (H) 65 - 99 mg/dL   BUN 9 6 - 20 mg/dL   Creatinine, Ser 0.89 0.44 - 1.00 mg/dL   Calcium 9.1 8.9 - 10.3 mg/dL   GFR calc non Af Amer >60 >60 mL/min   GFR calc Af Amer >60 >60 mL/min    Comment: (NOTE) The eGFR has been calculated using the CKD EPI equation. This calculation has not been validated in all clinical situations. eGFR's persistently <60 mL/min signify possible Chronic Kidney Disease.    Anion gap 6 5 - 15  Glucose, capillary     Status: Abnormal   Collection Time: 08/28/15  7:59 AM  Result Value Ref Range   Glucose-Capillary 156 (H) 65 - 99 mg/dL  Glucose, capillary     Status: Abnormal   Collection Time: 08/28/15 11:16 AM  Result Value Ref Range   Glucose-Capillary 140 (H) 65 - 99 mg/dL  Glucose, capillary     Status: Abnormal   Collection Time: 08/28/15  5:56 PM  Result Value  Ref Range   Glucose-Capillary 126 (H) 65 - 99 mg/dL  Glucose, capillary     Status: Abnormal   Collection Time: 08/29/15  7:27 AM  Result Value Ref Range   Glucose-Capillary 147 (H) 65 - 99 mg/dL    Radiology/Results: No results found.  Anti-infectives: Anti-infectives    Start     Dose/Rate Route Frequency Ordered Stop   08/26/15 0000  piperacillin-tazobactam (ZOSYN) IVPB 3.375 g     3.375 g 12.5 mL/hr over 240 Minutes Intravenous Every 8 hours 08/25/15 2344     08/25/15 1815  piperacillin-tazobactam (ZOSYN) IVPB 3.375 g     3.375 g 12.5 mL/hr over 240 Minutes Intravenous  Once 08/25/15 1808 08/25/15 2229      Assessment/Plan: Problem List: Patient Active Problem List   Diagnosis Date Noted  . Perforation of cecum s/p R hemicolectomy with anastamosis (08/25/2015) 08/26/2015  . Thoracic back pain 04/25/2015  . Chronic venous insufficiency 12/21/2014  . Hip arthritis 08/10/2014  . Breast pain, right 07/22/2014  . Thyroid nodule 05/01/2014  . PALPITATIONS, CHRONIC 12/08/2009  . HIP PAIN,  BILATERAL 09/15/2009  . OVERWEIGHT 03/08/2008  . Cerebral artery occlusion with cerebral infarction ([place]) 03/08/2008  . Hyperlipemia 01/30/2007  . Essential hypertension 01/30/2007  . Diabetes mellitus type 2, controlled, with complications ([place]) 47/65/4650    Will keep on clear liquids for now.  Stable.   4 Days Post-Op    LOS: 4 days   Matt B. Hassell Done, MD, [facility] [phone] beeper [phone]  08/29/2015 9:23 AM

## 2015-08-29 NOTE — Progress Notes (Signed)
Carelink summary report received. Battery status OK. Normal device function. No new symptom episodes, tachy episodes, brady, or pause episodes. No new AF episodes. Monthly summary reports and ROV/PRN 

## 2015-08-30 ENCOUNTER — Encounter: Payer: Self-pay | Admitting: Gastroenterology

## 2015-08-30 LAB — GLUCOSE, CAPILLARY
GLUCOSE-CAPILLARY: 136 mg/dL — AB (ref 65–99)
GLUCOSE-CAPILLARY: 158 mg/dL — AB (ref 65–99)

## 2015-08-30 NOTE — Progress Notes (Signed)
[facility] Progress Note  5 Days Post-Op  Subjective: Pt sitting in bed, NAD, drinking liquids. Denies fever, chills, N/V. Abdominal pain worse with movement, of a burning quality. Relieved with rest. Having flatus. Had one BM today that was dark brown. One BM yesterday. Mobilizing to toilet with assistance. Still having anxiety with BID dressing changes.    Objective: Vital signs in last 24 hours: Temp:  [98.7 F (37.1 C)-98.8 F (37.1 C)] 98.7 F (37.1 C) (05/30 0507) Pulse Rate:  [62-65] 63 (05/30 0507) Resp:  [16-18] 18 (05/30 0507) BP: (154-176)/(50-66) 176/60 mmHg (05/30 0507) SpO2:  [96 %-100 %] 96 % (05/30 0507) Last BM Date: 08/30/15  Intake/Output from previous day: 05/29 0701 - 05/30 0700 In: 4876.3 [P.O.:1040; I.V.:3836.3] Out: 2126 [Urine:2125; Stool:1] Intake/Output this shift:    PE: Gen:  Alert, NAD, pleasant Card:  RRR, no M/G/R heard Pulm:  CTA, no W/R/R Abd: Soft, appropriately tender, ND, +BS, midline incision with dressing in place- nurse to page me during dressing change at 10am. Ext:  No erythema, edema, or tenderness  Lab Results:   Recent Labs  08/28/15 0445  WBC 12.4*  HGB 10.2*  HCT 33.1*  PLT 208   BMET  Recent Labs  08/28/15 0445  NA 147*  K 3.9  CL 112*  CO2 29  GLUCOSE 154*  BUN 9  CREATININE 0.89  CALCIUM 9.1   PT/INR No results for input(s): LABPROT, INR in the last 72 hours. CMP     Component Value Date/Time   NA 147* 08/28/2015 0445   K 3.9 08/28/2015 0445   CL 112* 08/28/2015 0445   CO2 29 08/28/2015 0445   GLUCOSE 154* 08/28/2015 0445   BUN 9 08/28/2015 0445   CREATININE 0.89 08/28/2015 0445   CALCIUM 9.1 08/28/2015 0445   PROT 6.7 08/25/2015 1610   ALBUMIN 4.2 08/25/2015 1610   AST 18 08/25/2015 1610   ALT 16 08/25/2015 1610   ALKPHOS 56 08/25/2015 1610   BILITOT 0.2* 08/25/2015 1610   GFRNONAA >60 08/28/2015 0445   GFRAA >60 08/28/2015 0445   Lipase  No results found for:  LIPASE  Studies/Results: No results found.  Anti-infectives: Anti-infectives    Start     Dose/Rate Route Frequency Ordered Stop   08/26/15 0000  piperacillin-tazobactam (ZOSYN) IVPB 3.375 g     3.375 g 12.5 mL/hr over 240 Minutes Intravenous Every 8 hours 08/25/15 2344     08/25/15 1815  piperacillin-tazobactam (ZOSYN) IVPB 3.375 g     3.375 g 12.5 mL/hr over 240 Minutes Intravenous  Once 08/25/15 1808 08/25/15 2229       Assessment/Plan POD#5 S/p ileocolectomy with primary anastamosis by Dr. Marlou Starks 08/25/15 - Due to free air after colonoscopy on morning of 5/25. - IVF, analgesics, continue BID wet-to-dry dressing changes - Zosyn day 4/4 - mobilize as tolerated - Incentive Spirometry   FEN - advance to full liquid diet DVT - SCD's, heparin TID Dispo - floor   LOS: 5 days    Jill Alexanders , [facility] 08/30/2015, 8:41 AM Pager: [phone] Mon-Fri 7:00 am-4:30 pm Sat-Sun 7:00 am-11:30 am

## 2015-08-31 ENCOUNTER — Encounter: Payer: Self-pay | Admitting: Gastroenterology

## 2015-08-31 ENCOUNTER — Telehealth: Payer: Self-pay | Admitting: Gastroenterology

## 2015-08-31 LAB — GLUCOSE, CAPILLARY
GLUCOSE-CAPILLARY: 142 mg/dL — AB (ref 65–99)
GLUCOSE-CAPILLARY: 151 mg/dL — AB (ref 65–99)
GLUCOSE-CAPILLARY: 123 mg/dL — AB (ref 65–99)
Glucose-Capillary: 142 mg/dL — ABNORMAL HIGH (ref 65–99)

## 2015-08-31 MED ORDER — HYDROCODONE-ACETAMINOPHEN 10-325 MG PO TABS
1.0000 | ORAL_TABLET | ORAL | Status: DC | PRN
  Administered 2015-08-31 – 2015-09-01 (×4): 2 via ORAL
  Filled 2015-08-31 (×4): qty 2

## 2015-08-31 MED ORDER — PANTOPRAZOLE SODIUM 40 MG PO TBEC
40.0000 mg | DELAYED_RELEASE_TABLET | Freq: Every day | ORAL | Status: DC
  Administered 2015-08-31 – 2015-09-01 (×2): 40 mg via ORAL
  Filled 2015-08-31 (×2): qty 1

## 2015-08-31 MED ORDER — METHOCARBAMOL 500 MG PO TABS: 1000.0000 mg | ORAL_TABLET | Freq: Four times a day (QID) | ORAL | Status: DC | PRN

## 2015-08-31 NOTE — Telephone Encounter (Signed)
I will do the letter. Thanks

## 2015-08-31 NOTE — Progress Notes (Signed)
[facility] Progress Note  6 Days Post-Op  Subjective: Pt in bed in NAD. Tolerating diet. Having BMs/flatus. ambulating well, sat up in chair most of the day yesterday. Discussed switching to oral pain medications with the patient. Also discussed what to expect going home with wound vac.   Objective: Vital signs in last 24 hours: Temp:  [98.4 F (36.9 C)-98.6 F (37 C)] 98.4 F (36.9 C) (05/31 0510) Pulse Rate:  [55-73] 55 (05/31 0510) Resp:  [16-18] 18 (05/31 0510) BP: (149-161)/(48-84) 161/48 mmHg (05/31 0510) SpO2:  [97 %-100 %] 97 % (05/31 0510) Last BM Date: 08/31/15  Intake/Output from previous day: 05/30 0701 - 05/31 0700 In: 2281.3 [P.O.:480; I.V.:1801.3] Out: 1125 [Urine:825; Stool:300] Intake/Output this shift: Total I/O In: 240 [P.O.:240] Out: -   PE: Gen:  Alert, NAD, pleasant Card:  RRR, no M/G/R heard Pulm:  CTA, no W/R/R Abd: Soft, appropriately tender, ND, +BS, midline incision with dressing in place- nurse to page me during dressing change this AM Ext:  No erythema, edema, or tenderness  Lab Results:  No results for input(s): WBC, HGB, HCT, PLT in the last 72 hours. BMET No results for input(s): NA, K, CL, CO2, GLUCOSE, BUN, CREATININE, CALCIUM in the last 72 hours. PT/INR No results for input(s): LABPROT, INR in the last 72 hours. CMP     Component Value Date/Time   NA 147* 08/28/2015 0445   K 3.9 08/28/2015 0445   CL 112* 08/28/2015 0445   CO2 29 08/28/2015 0445   GLUCOSE 154* 08/28/2015 0445   BUN 9 08/28/2015 0445   CREATININE 0.89 08/28/2015 0445   CALCIUM 9.1 08/28/2015 0445   PROT 6.7 08/25/2015 1610   ALBUMIN 4.2 08/25/2015 1610   AST 18 08/25/2015 1610   ALT 16 08/25/2015 1610   ALKPHOS 56 08/25/2015 1610   BILITOT 0.2* 08/25/2015 1610   GFRNONAA >60 08/28/2015 0445   GFRAA >60 08/28/2015 0445   Lipase  No results found for: LIPASE     Studies/Results: No results found.  Anti-infectives: Anti-infectives    Start     Dose/Rate Route Frequency Ordered Stop   08/26/15 0000  piperacillin-tazobactam (ZOSYN) IVPB 3.375 g     3.375 g 12.5 mL/hr over 240 Minutes Intravenous Every 8 hours 08/25/15 2344     08/25/15 1815  piperacillin-tazobactam (ZOSYN) IVPB 3.375 g     3.375 g 12.5 mL/hr over 240 Minutes Intravenous  Once 08/25/15 1808 08/25/15 2229       Assessment/Plan POD#6 S/p ileocolectomy with primary anastamosis by Dr. Marlou Starks 08/25/15 - Due to free air after colonoscopy on morning of 5/25. - IVF, PO analgesics, wound vac placement today (WOC consult placed)  - Zosyn - completed - mobilize as tolerated - Incentive Spirometry   FEN - advance to soft diet DVT - SCD's, heparin TID  Dispo - floor, likely discharge tomorrow if pain controlled PO  - pt will need home health for wound vac changes q 3 days and will follow-up in the [facility] office in 2  Weeks.  - will also need follow up with GI   LOS: 6 days    Jill Alexanders , [facility] 08/31/2015, 10:06 AM Pager: [phone] Mon-Fri 7:00 am-4:30 pm Sat-Sun 7:00 am-11:30 am

## 2015-08-31 NOTE — Telephone Encounter (Signed)
Please advise on the letter. Thanks

## 2015-08-31 NOTE — Consult Note (Addendum)
WOC wound consult note Reason for Consult:Placement of initial negative pressure wound therapy dressing. Will Creig Hines and Melina Modena, [facility] PAs in attendance.  Patient is medicated for pain and given antianxiety medication prior to wound care. Wound type: Surgical Pressure Ulcer POA: No Measurement:16cm x 4.5cm x 4.5cm Wound bed: Red, moist Drainage (amount, consistency, odor) Moderate amount of serous exudate Periwound:Intact, clear.  One staple removed from port insertion site today. Dressing procedure/placement/frequency: Old dressing removed and wound cleansed with NS.  After measurements taken, periwound skin is prepped with drape.  One piece of black granufoam is inserted into wound defect ensuring that umbilicus is not covered.  The sponge is secured with drape and an opening cut for the attachment of the T.R.A.C. pad. Therapy initiated at 195mmHg continuous negative pressure and an immediate seal is achieved.  Patient tolerated procedure well.  Bedside RN may perform subsequent dressing changes. [place] nursing team will not follow, but will remain available to this patient, the nursing and medical teams.  Please re-consult if needed. Thanks, Maudie Flakes, MSN, RN, [place], Arther Abbott  Pager# [phone]

## 2015-08-31 NOTE — Progress Notes (Signed)
PHARMACY BRIEF NOTE:  PROTONIX IV TO PO CONVERSION  This patient is receiving IV Protonix, which is in critically short supply.    Based on emergency conservation measures implemented by the Pharmacy and Therapeutics Committee chairman in consultation with gastroenterology, this patient now meets criteria for automatic switch to PO Protonix; the medication profile has been updated accordingly.  If there are questions regarding this change, please contact Pharmacy at [phone]  Thank you,   Minda Ditto PharmD Pager [phone] 08/31/2015, 12:02 PM

## 2015-08-31 NOTE — Care Management Note (Signed)
Case Management Note  Patient Details  Name: Tiffany Wiggins MRN: [id number] Date of Birth: 05/26/1945  Subjective/Objective: POD#6 S/p ileocolectomy with primary anastamosis                    Action/Plan: Discharge planning, spoke with patient at beside. No preference for [facility] agency, contacted [facility] for referral. Application for Vac approval begun.   Expected Discharge Date:   (unknown)               Expected Discharge Plan:  [place]  In-House Referral:  NA  Discharge planning Services  CM Consult  Post Acute Care Choice:  Home Health, Durable Medical Equipment Choice offered to:  Patient  DME Arranged:  Vac DME Agency:  [facility]  HH Arranged:  RN [place] Agency:  [facility]  Status of Service:  Completed, signed off  Medicare Important Message Given:    Date Medicare IM Given:    Medicare IM give by:    Date Additional Medicare IM Given:    Additional Medicare Important Message give by:     If discussed at [place] of Stay Meetings, dates discussed:    Additional Comments:  Guadalupe Maple, RN 08/31/2015, 2:32 PM [phone]

## 2015-08-31 NOTE — Discharge Summary (Signed)
[facility] Discharge Summary   Patient ID: Tiffany Wiggins MRN: [id number] DOB/AGE: April 15, 1945 70 y.o.  Admit date: 08/25/2015 Discharge date: 09/01/2015  Admitting Diagnosis: pneumoperitoneum  Discharge Diagnosis Patient Active Problem List   Diagnosis Date Noted  . Perforation of cecum s/p R hemicolectomy with anastamosis (08/25/2015) 08/26/2015  . Thoracic back pain 04/25/2015  . Chronic venous insufficiency 12/21/2014  . Hip arthritis 08/10/2014  . Breast pain, right 07/22/2014  . Thyroid nodule 05/01/2014  . PALPITATIONS, CHRONIC 12/08/2009  . HIP PAIN, BILATERAL 09/15/2009  . OVERWEIGHT 03/08/2008  . Cerebral artery occlusion with cerebral infarction ([place]) 03/08/2008  . Hyperlipemia 01/30/2007  . Essential hypertension 01/30/2007  . Diabetes mellitus type 2, controlled, with complications ([place]) 123456    Consultants [place] Margarita Grizzle McNichol, RN) - 08/31/15 - negative pressure wound therapy dressing  Imaging: CT ABD/PELVIS WITH CONTRAST: massive pneumoperitoneum, site of presumed perforation not identified.  Procedures Dr. Autumn Messing III (08/25/15) - Exploratory Laparotomy, Iileocecectomy, Primary Anastomosis, Lysis of Adhesions  Hospital Course:  [name] with PMH of diabetes and hyperlipidemia who presented to [facility] on [facility]15 with abdominal distention and right shoulder pain after a routine colonoscopy earlier in the day. Exam was significant for diffuse abdominal tenderness and distention. CT scan significant for above findings. The patient was admitted and taken urgently for the procedure above. Tolerated procedure well and was transferred to the floor.  Diet was advanced as tolerated. On POD#1 the patients laparotomy incision was examined and BID wet-to-dry dressings were initiated. On POD#6 wound and ostomy care was consulted and a negative pressure device was placed successfully in the laparotomy incision site. On POD#7, the patient was voiding well,  tolerating diet, ambulating, pain well controlled, vital signs stable, wound vac in place, and felt ready for discharge.  Patient will receive home health for vac sponge changes three times weekly and will follow up in our office in 2 weeks. She knows to call with questions or concerns.  She will call to confirm appointment date/time.    Physical Exam: General: Pleasant white female sitting up in hospital bed, appears stated age, NAD. CV- RRR, normal S1/S2, no M/R/G, radial and dorsalis pedis pulses 2+ BL Pulm- breathing is non-labored. CTABL, no wheezes, rhales, rhonchi. Abd- soft, appopriately tender, ND, appropriate bowel sounds in 4 quadrants; wound vac in place over laparotomy incision, suction to 125 mmHg, draining serosanguinous drainage. GU- deferred  MSK- UE/LE symmetrical, no cyanosis, clubbing, or edema. Neuro- CN II-XII grossly in tact, no paresthesias. Psych- Alert and Oriented x3 with appropriate affect Skin: warm and dry, no rashes or lesions     Medication List    TAKE these medications        acetaminophen 500 MG tablet  Commonly known as:  TYLENOL  Take 1,000 mg by mouth 2 (two) times daily.     amLODipine 10 MG tablet  Commonly known as:  NORVASC  Take 10 mg by mouth daily.     aspirin 325 MG tablet  Take 325 mg by mouth daily.     CoQ10 100 MG Caps  Take 1 tablet by mouth daily.     docusate sodium 100 MG capsule  Commonly known as:  COLACE  Take 1 capsule (100 mg total) by mouth daily as needed for mild constipation or moderate constipation.     HYDROcodone-acetaminophen 10-325 MG tablet  Commonly known as:  NORCO  Take 1 tablet by mouth every 4 (four) hours as needed for moderate pain or  severe pain.     lisinopril 40 MG tablet  Commonly known as:  PRINIVIL,ZESTRIL  TAKE ONE TABLET BY MOUTH ONCE DAILY     metFORMIN 500 MG tablet  Commonly known as:  GLUCOPHAGE  TAKE ONE TABLET BY MOUTH AT BEDTIME     MIRALAX powder  Generic drug:  polyethylene  glycol powder  Take 8.5-34 g by mouth 2 (two) times daily. To correct constipation.  Adjust dose over 1-2 months.  Goal = ~1 bowel movement / day     MULTIPLE VITAMIN PO  Take 1 tablet by mouth daily.     omeprazole 20 MG capsule  Commonly known as:  PRILOSEC  TAKE ONE CAPSULE BY MOUTH ONCE DAILY     pravastatin 40 MG tablet  Commonly known as:  PRAVACHOL  Take 1 tablet (40 mg total) by mouth daily.     VISION-VITE PRESERVE PO  Take 1 tablet by mouth 2 (two) times daily.     Vitamin D (Cholecalciferol) 1000 units Tabs  Take 1 tablet by mouth daily.        Follow-up Information    Follow up with Merrie Roof, MD. Go on 09/14/2015.   Specialty:  General Surgery   Why:  follow-up appointment from your abdominal surgery and wound vac. Your appointment is at 10:00 am - please arrive 15-30 minutes early to get checked in and fill out paperwork.   Contact information:   [address] [place] [address] [phone]       Call Harl Bowie, MD.   Specialty:  Gastroenterology   Why:  Call to schedule a follow-up appointment from your hospital visit and colonoscopy.   Contact information:   [place] [address] [phone]       Follow up with [place].   Why:  nurse to manage wound vac; if you have a problem with your wound vac or wound please call advanced home care.   Contact information:   [place] [address] [phone]       Signed: Obie Dredge, [facility] 09/01/2015, 8:55 AM Pager: [phone] Mon-Fri 7:00 am-4:30 pm Sat-Sun 7:00 am-11:30 am

## 2015-08-31 NOTE — Telephone Encounter (Signed)
Mr Gasparyan says thank you. He is visiting with the patient right now. She is said to be doing well.  Okay to mail the letter to the home.

## 2015-08-31 NOTE — Telephone Encounter (Signed)
I gave him the letter

## 2015-09-01 LAB — GLUCOSE, CAPILLARY
GLUCOSE-CAPILLARY: 168 mg/dL — AB (ref 65–99)
GLUCOSE-CAPILLARY: 123 mg/dL — AB (ref 65–99)
GLUCOSE-CAPILLARY: 150 mg/dL — AB (ref 65–99)

## 2015-09-01 MED ORDER — HYDROCODONE-ACETAMINOPHEN 10-325 MG PO TABS: 1.0000 | ORAL_TABLET | ORAL | Status: DC | PRN

## 2015-09-01 MED ORDER — DOCUSATE SODIUM 100 MG PO CAPS: 100.0000 mg | ORAL_CAPSULE | Freq: Every day | ORAL | Status: AC | PRN

## 2015-09-01 MED ORDER — MIRALAX 17 GM/SCOOP PO POWD: 8.5000 g | Freq: Two times a day (BID) | ORAL | Status: DC

## 2015-09-01 NOTE — Discharge Instructions (Addendum)
Removal of the Colon (Colectomy) Care After  HOME CARE INSTRUCTIONS  Once home, an ice pack applied to the operative site may help with discomfort and keep swelling down.   Change dressings as directed.   Only take over-the-counter or prescription medicines for pain, discomfort, or fever as directed by your caregiver.   Eat a bland, lowfat diet.  Drink plenty of fluids.  Do not overeat.  Do not drive until released to do so by MD.  There should be no heavy lifting (more than 10 pounds), strenuous activities until after approval of your surgeon.   Home Health Nurses will help with your VAC changes.  Call them if you have any problems with the VAC.  Sponge bath.  Keep VAC dry.    Call the office [phone]) with any questions - your follow up appointment is scheduled for 09/14/15 at 10:00 am with Dr. Marlou Starks. Please arrive 15-30 minutes early. SEEK MEDICAL CARE IF (call the office):   There is redness, swelling, or increasing pain in the wound area.   Pus or blood is coming from the wound.   An unexplained oral temperature above 101.5 degrees F develops.   You notice a foul smell coming from the wound or dressing.   There is a breaking open of a wound (edges not staying together) after the sutures have been removed.   There is increasing abdominal pain.   You have persistent vomiting. SEEK IMMEDIATE MEDICAL CARE IF:   A rash develops.   There is difficulty breathing, or development of a reaction or side effects to medications given.  Document Released: 10/18/2003 Document Revised: 11/29/2010 Document Reviewed: 04/22/2007 [facility] Patient Information 2012 [place].

## 2015-09-01 NOTE — Care Management Important Message (Signed)
Important Message  Patient Details  Name: Tiffany Wiggins MRN: [id number] Date of Birth: 20-Aug-1945   Medicare Important Message Given:  Yes    Camillo Flaming 09/01/2015, 10:15 AMImportant Message  Patient Details  Name: Tiffany Wiggins MRN: [id number] Date of Birth: [birth year]   Medicare Important Message Given:  Yes    Camillo Flaming 09/01/2015, 10:15 AM

## 2015-09-01 NOTE — Progress Notes (Signed)
Discharge instructions discussed with patient and family, verbalized understanding and agreement, Prescriptions given to patient

## 2015-09-02 DIAGNOSIS — E785 Hyperlipidemia, unspecified: Secondary | ICD-10-CM | POA: Diagnosis not present

## 2015-09-02 DIAGNOSIS — E119 Type 2 diabetes mellitus without complications: Secondary | ICD-10-CM | POA: Diagnosis not present

## 2015-09-02 DIAGNOSIS — K279 Peptic ulcer, site unspecified, unspecified as acute or chronic, without hemorrhage or perforation: Secondary | ICD-10-CM | POA: Diagnosis not present

## 2015-09-02 DIAGNOSIS — I10 Essential (primary) hypertension: Secondary | ICD-10-CM | POA: Diagnosis not present

## 2015-09-02 DIAGNOSIS — Z7982 Long term (current) use of aspirin: Secondary | ICD-10-CM | POA: Diagnosis not present

## 2015-09-02 DIAGNOSIS — Z48815 Encounter for surgical aftercare following surgery on the digestive system: Secondary | ICD-10-CM | POA: Diagnosis not present

## 2015-09-02 DIAGNOSIS — M160 Bilateral primary osteoarthritis of hip: Secondary | ICD-10-CM | POA: Diagnosis not present

## 2015-09-02 DIAGNOSIS — Z87891 Personal history of nicotine dependence: Secondary | ICD-10-CM | POA: Diagnosis not present

## 2015-09-02 DIAGNOSIS — K589 Irritable bowel syndrome without diarrhea: Secondary | ICD-10-CM | POA: Diagnosis not present

## 2015-09-05 DIAGNOSIS — Z87891 Personal history of nicotine dependence: Secondary | ICD-10-CM | POA: Diagnosis not present

## 2015-09-05 DIAGNOSIS — M160 Bilateral primary osteoarthritis of hip: Secondary | ICD-10-CM | POA: Diagnosis not present

## 2015-09-05 DIAGNOSIS — E119 Type 2 diabetes mellitus without complications: Secondary | ICD-10-CM | POA: Diagnosis not present

## 2015-09-05 DIAGNOSIS — E785 Hyperlipidemia, unspecified: Secondary | ICD-10-CM | POA: Diagnosis not present

## 2015-09-05 DIAGNOSIS — I10 Essential (primary) hypertension: Secondary | ICD-10-CM | POA: Diagnosis not present

## 2015-09-05 DIAGNOSIS — Z48815 Encounter for surgical aftercare following surgery on the digestive system: Secondary | ICD-10-CM | POA: Diagnosis not present

## 2015-09-05 DIAGNOSIS — K279 Peptic ulcer, site unspecified, unspecified as acute or chronic, without hemorrhage or perforation: Secondary | ICD-10-CM | POA: Diagnosis not present

## 2015-09-05 DIAGNOSIS — Z7982 Long term (current) use of aspirin: Secondary | ICD-10-CM | POA: Diagnosis not present

## 2015-09-05 DIAGNOSIS — K589 Irritable bowel syndrome without diarrhea: Secondary | ICD-10-CM | POA: Diagnosis not present

## 2015-09-07 DIAGNOSIS — K279 Peptic ulcer, site unspecified, unspecified as acute or chronic, without hemorrhage or perforation: Secondary | ICD-10-CM | POA: Diagnosis not present

## 2015-09-07 DIAGNOSIS — Z7982 Long term (current) use of aspirin: Secondary | ICD-10-CM | POA: Diagnosis not present

## 2015-09-07 DIAGNOSIS — E119 Type 2 diabetes mellitus without complications: Secondary | ICD-10-CM | POA: Diagnosis not present

## 2015-09-07 DIAGNOSIS — Z48815 Encounter for surgical aftercare following surgery on the digestive system: Secondary | ICD-10-CM | POA: Diagnosis not present

## 2015-09-07 DIAGNOSIS — M160 Bilateral primary osteoarthritis of hip: Secondary | ICD-10-CM | POA: Diagnosis not present

## 2015-09-07 DIAGNOSIS — E785 Hyperlipidemia, unspecified: Secondary | ICD-10-CM | POA: Diagnosis not present

## 2015-09-07 DIAGNOSIS — K589 Irritable bowel syndrome without diarrhea: Secondary | ICD-10-CM | POA: Diagnosis not present

## 2015-09-07 DIAGNOSIS — I10 Essential (primary) hypertension: Secondary | ICD-10-CM | POA: Diagnosis not present

## 2015-09-07 DIAGNOSIS — Z87891 Personal history of nicotine dependence: Secondary | ICD-10-CM | POA: Diagnosis not present

## 2015-09-08 DIAGNOSIS — S31109A Unspecified open wound of abdominal wall, unspecified quadrant without penetration into peritoneal cavity, initial encounter: Secondary | ICD-10-CM | POA: Diagnosis not present

## 2015-09-09 ENCOUNTER — Telehealth: Payer: Self-pay | Admitting: Cardiology

## 2015-09-09 ENCOUNTER — Other Ambulatory Visit: Payer: Self-pay | Admitting: Internal Medicine

## 2015-09-09 ENCOUNTER — Encounter: Payer: Self-pay | Admitting: Cardiology

## 2015-09-09 DIAGNOSIS — Z87891 Personal history of nicotine dependence: Secondary | ICD-10-CM | POA: Diagnosis not present

## 2015-09-09 DIAGNOSIS — E119 Type 2 diabetes mellitus without complications: Secondary | ICD-10-CM | POA: Diagnosis not present

## 2015-09-09 DIAGNOSIS — M160 Bilateral primary osteoarthritis of hip: Secondary | ICD-10-CM | POA: Diagnosis not present

## 2015-09-09 DIAGNOSIS — K589 Irritable bowel syndrome without diarrhea: Secondary | ICD-10-CM | POA: Diagnosis not present

## 2015-09-09 DIAGNOSIS — E785 Hyperlipidemia, unspecified: Secondary | ICD-10-CM | POA: Diagnosis not present

## 2015-09-09 DIAGNOSIS — Z48815 Encounter for surgical aftercare following surgery on the digestive system: Secondary | ICD-10-CM | POA: Diagnosis not present

## 2015-09-09 DIAGNOSIS — I10 Essential (primary) hypertension: Secondary | ICD-10-CM | POA: Diagnosis not present

## 2015-09-09 DIAGNOSIS — K279 Peptic ulcer, site unspecified, unspecified as acute or chronic, without hemorrhage or perforation: Secondary | ICD-10-CM | POA: Diagnosis not present

## 2015-09-09 DIAGNOSIS — Z7982 Long term (current) use of aspirin: Secondary | ICD-10-CM | POA: Diagnosis not present

## 2015-09-09 NOTE — Telephone Encounter (Signed)
Spoke w/ pt husband b/c pt home monitor has not updated in at least 14 days. Pt husband informed me that pt has not been sleeping near the monitor and he will move it tonight so she will be near it.

## 2015-09-12 DIAGNOSIS — Z87891 Personal history of nicotine dependence: Secondary | ICD-10-CM | POA: Diagnosis not present

## 2015-09-12 DIAGNOSIS — I10 Essential (primary) hypertension: Secondary | ICD-10-CM | POA: Diagnosis not present

## 2015-09-12 DIAGNOSIS — Z48815 Encounter for surgical aftercare following surgery on the digestive system: Secondary | ICD-10-CM | POA: Diagnosis not present

## 2015-09-12 DIAGNOSIS — E119 Type 2 diabetes mellitus without complications: Secondary | ICD-10-CM | POA: Diagnosis not present

## 2015-09-12 DIAGNOSIS — K279 Peptic ulcer, site unspecified, unspecified as acute or chronic, without hemorrhage or perforation: Secondary | ICD-10-CM | POA: Diagnosis not present

## 2015-09-12 DIAGNOSIS — M160 Bilateral primary osteoarthritis of hip: Secondary | ICD-10-CM | POA: Diagnosis not present

## 2015-09-12 DIAGNOSIS — E785 Hyperlipidemia, unspecified: Secondary | ICD-10-CM | POA: Diagnosis not present

## 2015-09-12 DIAGNOSIS — Z7982 Long term (current) use of aspirin: Secondary | ICD-10-CM | POA: Diagnosis not present

## 2015-09-12 DIAGNOSIS — K589 Irritable bowel syndrome without diarrhea: Secondary | ICD-10-CM | POA: Diagnosis not present

## 2015-09-14 DIAGNOSIS — Z48815 Encounter for surgical aftercare following surgery on the digestive system: Secondary | ICD-10-CM | POA: Diagnosis not present

## 2015-09-14 DIAGNOSIS — E119 Type 2 diabetes mellitus without complications: Secondary | ICD-10-CM | POA: Diagnosis not present

## 2015-09-14 DIAGNOSIS — E785 Hyperlipidemia, unspecified: Secondary | ICD-10-CM | POA: Diagnosis not present

## 2015-09-14 DIAGNOSIS — M160 Bilateral primary osteoarthritis of hip: Secondary | ICD-10-CM | POA: Diagnosis not present

## 2015-09-14 DIAGNOSIS — K279 Peptic ulcer, site unspecified, unspecified as acute or chronic, without hemorrhage or perforation: Secondary | ICD-10-CM | POA: Diagnosis not present

## 2015-09-14 DIAGNOSIS — I10 Essential (primary) hypertension: Secondary | ICD-10-CM | POA: Diagnosis not present

## 2015-09-14 DIAGNOSIS — Z7982 Long term (current) use of aspirin: Secondary | ICD-10-CM | POA: Diagnosis not present

## 2015-09-14 DIAGNOSIS — K589 Irritable bowel syndrome without diarrhea: Secondary | ICD-10-CM | POA: Diagnosis not present

## 2015-09-14 DIAGNOSIS — Z87891 Personal history of nicotine dependence: Secondary | ICD-10-CM | POA: Diagnosis not present

## 2015-09-15 DIAGNOSIS — K589 Irritable bowel syndrome without diarrhea: Secondary | ICD-10-CM | POA: Diagnosis not present

## 2015-09-15 DIAGNOSIS — E785 Hyperlipidemia, unspecified: Secondary | ICD-10-CM | POA: Diagnosis not present

## 2015-09-15 DIAGNOSIS — K279 Peptic ulcer, site unspecified, unspecified as acute or chronic, without hemorrhage or perforation: Secondary | ICD-10-CM | POA: Diagnosis not present

## 2015-09-15 DIAGNOSIS — Z48815 Encounter for surgical aftercare following surgery on the digestive system: Secondary | ICD-10-CM | POA: Diagnosis not present

## 2015-09-15 DIAGNOSIS — M160 Bilateral primary osteoarthritis of hip: Secondary | ICD-10-CM | POA: Diagnosis not present

## 2015-09-15 DIAGNOSIS — Z87891 Personal history of nicotine dependence: Secondary | ICD-10-CM | POA: Diagnosis not present

## 2015-09-15 DIAGNOSIS — E119 Type 2 diabetes mellitus without complications: Secondary | ICD-10-CM | POA: Diagnosis not present

## 2015-09-15 DIAGNOSIS — I10 Essential (primary) hypertension: Secondary | ICD-10-CM | POA: Diagnosis not present

## 2015-09-15 DIAGNOSIS — Z7982 Long term (current) use of aspirin: Secondary | ICD-10-CM | POA: Diagnosis not present

## 2015-09-16 ENCOUNTER — Ambulatory Visit (INDEPENDENT_AMBULATORY_CARE_PROVIDER_SITE_OTHER): Payer: Medicare Other | Admitting: *Deleted

## 2015-09-16 DIAGNOSIS — K589 Irritable bowel syndrome without diarrhea: Secondary | ICD-10-CM | POA: Diagnosis not present

## 2015-09-16 DIAGNOSIS — E785 Hyperlipidemia, unspecified: Secondary | ICD-10-CM | POA: Diagnosis not present

## 2015-09-16 DIAGNOSIS — K279 Peptic ulcer, site unspecified, unspecified as acute or chronic, without hemorrhage or perforation: Secondary | ICD-10-CM | POA: Diagnosis not present

## 2015-09-16 DIAGNOSIS — Z48815 Encounter for surgical aftercare following surgery on the digestive system: Secondary | ICD-10-CM | POA: Diagnosis not present

## 2015-09-16 DIAGNOSIS — I10 Essential (primary) hypertension: Secondary | ICD-10-CM | POA: Diagnosis not present

## 2015-09-16 DIAGNOSIS — Z7982 Long term (current) use of aspirin: Secondary | ICD-10-CM | POA: Diagnosis not present

## 2015-09-16 DIAGNOSIS — M160 Bilateral primary osteoarthritis of hip: Secondary | ICD-10-CM | POA: Diagnosis not present

## 2015-09-16 DIAGNOSIS — E119 Type 2 diabetes mellitus without complications: Secondary | ICD-10-CM | POA: Diagnosis not present

## 2015-09-16 DIAGNOSIS — I639 Cerebral infarction, unspecified: Secondary | ICD-10-CM | POA: Diagnosis not present

## 2015-09-16 DIAGNOSIS — Z87891 Personal history of nicotine dependence: Secondary | ICD-10-CM | POA: Diagnosis not present

## 2015-09-19 DIAGNOSIS — I10 Essential (primary) hypertension: Secondary | ICD-10-CM | POA: Diagnosis not present

## 2015-09-19 DIAGNOSIS — M160 Bilateral primary osteoarthritis of hip: Secondary | ICD-10-CM | POA: Diagnosis not present

## 2015-09-19 DIAGNOSIS — Z87891 Personal history of nicotine dependence: Secondary | ICD-10-CM | POA: Diagnosis not present

## 2015-09-19 DIAGNOSIS — E119 Type 2 diabetes mellitus without complications: Secondary | ICD-10-CM | POA: Diagnosis not present

## 2015-09-19 DIAGNOSIS — Z7982 Long term (current) use of aspirin: Secondary | ICD-10-CM | POA: Diagnosis not present

## 2015-09-19 DIAGNOSIS — Z48815 Encounter for surgical aftercare following surgery on the digestive system: Secondary | ICD-10-CM | POA: Diagnosis not present

## 2015-09-19 DIAGNOSIS — K279 Peptic ulcer, site unspecified, unspecified as acute or chronic, without hemorrhage or perforation: Secondary | ICD-10-CM | POA: Diagnosis not present

## 2015-09-19 DIAGNOSIS — K589 Irritable bowel syndrome without diarrhea: Secondary | ICD-10-CM | POA: Diagnosis not present

## 2015-09-19 DIAGNOSIS — E785 Hyperlipidemia, unspecified: Secondary | ICD-10-CM | POA: Diagnosis not present

## 2015-09-19 NOTE — Progress Notes (Signed)
Carelink Summary Report / Loop Recorder 

## 2015-09-21 ENCOUNTER — Other Ambulatory Visit: Payer: Self-pay

## 2015-09-21 DIAGNOSIS — I10 Essential (primary) hypertension: Secondary | ICD-10-CM | POA: Diagnosis not present

## 2015-09-21 DIAGNOSIS — R197 Diarrhea, unspecified: Secondary | ICD-10-CM | POA: Diagnosis not present

## 2015-09-21 DIAGNOSIS — Z87891 Personal history of nicotine dependence: Secondary | ICD-10-CM | POA: Diagnosis not present

## 2015-09-21 DIAGNOSIS — Z7982 Long term (current) use of aspirin: Secondary | ICD-10-CM | POA: Diagnosis not present

## 2015-09-21 DIAGNOSIS — E785 Hyperlipidemia, unspecified: Secondary | ICD-10-CM | POA: Diagnosis not present

## 2015-09-21 DIAGNOSIS — Z48815 Encounter for surgical aftercare following surgery on the digestive system: Secondary | ICD-10-CM | POA: Diagnosis not present

## 2015-09-21 DIAGNOSIS — M160 Bilateral primary osteoarthritis of hip: Secondary | ICD-10-CM | POA: Diagnosis not present

## 2015-09-21 DIAGNOSIS — E119 Type 2 diabetes mellitus without complications: Secondary | ICD-10-CM | POA: Diagnosis not present

## 2015-09-21 DIAGNOSIS — K589 Irritable bowel syndrome without diarrhea: Secondary | ICD-10-CM | POA: Diagnosis not present

## 2015-09-21 DIAGNOSIS — K279 Peptic ulcer, site unspecified, unspecified as acute or chronic, without hemorrhage or perforation: Secondary | ICD-10-CM | POA: Diagnosis not present

## 2015-09-22 LAB — CLOSTRIDIUM DIFFICILE BY PCR: Toxigenic C. Difficile by PCR: NEGATIVE

## 2015-09-23 DIAGNOSIS — Z48815 Encounter for surgical aftercare following surgery on the digestive system: Secondary | ICD-10-CM | POA: Diagnosis not present

## 2015-09-23 DIAGNOSIS — E785 Hyperlipidemia, unspecified: Secondary | ICD-10-CM | POA: Diagnosis not present

## 2015-09-23 DIAGNOSIS — Z87891 Personal history of nicotine dependence: Secondary | ICD-10-CM | POA: Diagnosis not present

## 2015-09-23 DIAGNOSIS — K589 Irritable bowel syndrome without diarrhea: Secondary | ICD-10-CM | POA: Diagnosis not present

## 2015-09-23 DIAGNOSIS — I10 Essential (primary) hypertension: Secondary | ICD-10-CM | POA: Diagnosis not present

## 2015-09-23 DIAGNOSIS — Z7982 Long term (current) use of aspirin: Secondary | ICD-10-CM | POA: Diagnosis not present

## 2015-09-23 DIAGNOSIS — E119 Type 2 diabetes mellitus without complications: Secondary | ICD-10-CM | POA: Diagnosis not present

## 2015-09-23 DIAGNOSIS — M160 Bilateral primary osteoarthritis of hip: Secondary | ICD-10-CM | POA: Diagnosis not present

## 2015-09-23 DIAGNOSIS — K279 Peptic ulcer, site unspecified, unspecified as acute or chronic, without hemorrhage or perforation: Secondary | ICD-10-CM | POA: Diagnosis not present

## 2015-09-26 DIAGNOSIS — Z7982 Long term (current) use of aspirin: Secondary | ICD-10-CM | POA: Diagnosis not present

## 2015-09-26 DIAGNOSIS — K279 Peptic ulcer, site unspecified, unspecified as acute or chronic, without hemorrhage or perforation: Secondary | ICD-10-CM | POA: Diagnosis not present

## 2015-09-26 DIAGNOSIS — Z87891 Personal history of nicotine dependence: Secondary | ICD-10-CM | POA: Diagnosis not present

## 2015-09-26 DIAGNOSIS — E785 Hyperlipidemia, unspecified: Secondary | ICD-10-CM | POA: Diagnosis not present

## 2015-09-26 DIAGNOSIS — E119 Type 2 diabetes mellitus without complications: Secondary | ICD-10-CM | POA: Diagnosis not present

## 2015-09-26 DIAGNOSIS — I10 Essential (primary) hypertension: Secondary | ICD-10-CM | POA: Diagnosis not present

## 2015-09-26 DIAGNOSIS — M160 Bilateral primary osteoarthritis of hip: Secondary | ICD-10-CM | POA: Diagnosis not present

## 2015-09-26 DIAGNOSIS — K589 Irritable bowel syndrome without diarrhea: Secondary | ICD-10-CM | POA: Diagnosis not present

## 2015-09-26 DIAGNOSIS — Z48815 Encounter for surgical aftercare following surgery on the digestive system: Secondary | ICD-10-CM | POA: Diagnosis not present

## 2015-09-26 LAB — CUP PACEART REMOTE DEVICE CHECK
Date Time Interrogation Session: 20170518003649
Date Time Interrogation Session: 20170617003529

## 2015-09-28 DIAGNOSIS — Z48815 Encounter for surgical aftercare following surgery on the digestive system: Secondary | ICD-10-CM | POA: Diagnosis not present

## 2015-09-28 DIAGNOSIS — I10 Essential (primary) hypertension: Secondary | ICD-10-CM | POA: Diagnosis not present

## 2015-09-28 DIAGNOSIS — M160 Bilateral primary osteoarthritis of hip: Secondary | ICD-10-CM | POA: Diagnosis not present

## 2015-09-28 DIAGNOSIS — Z87891 Personal history of nicotine dependence: Secondary | ICD-10-CM | POA: Diagnosis not present

## 2015-09-28 DIAGNOSIS — K589 Irritable bowel syndrome without diarrhea: Secondary | ICD-10-CM | POA: Diagnosis not present

## 2015-09-28 DIAGNOSIS — E119 Type 2 diabetes mellitus without complications: Secondary | ICD-10-CM | POA: Diagnosis not present

## 2015-09-28 DIAGNOSIS — E785 Hyperlipidemia, unspecified: Secondary | ICD-10-CM | POA: Diagnosis not present

## 2015-09-28 DIAGNOSIS — K279 Peptic ulcer, site unspecified, unspecified as acute or chronic, without hemorrhage or perforation: Secondary | ICD-10-CM | POA: Diagnosis not present

## 2015-09-28 DIAGNOSIS — Z7982 Long term (current) use of aspirin: Secondary | ICD-10-CM | POA: Diagnosis not present

## 2015-09-29 DIAGNOSIS — K279 Peptic ulcer, site unspecified, unspecified as acute or chronic, without hemorrhage or perforation: Secondary | ICD-10-CM | POA: Diagnosis not present

## 2015-09-29 DIAGNOSIS — Z7982 Long term (current) use of aspirin: Secondary | ICD-10-CM | POA: Diagnosis not present

## 2015-09-29 DIAGNOSIS — K589 Irritable bowel syndrome without diarrhea: Secondary | ICD-10-CM | POA: Diagnosis not present

## 2015-09-29 DIAGNOSIS — E119 Type 2 diabetes mellitus without complications: Secondary | ICD-10-CM | POA: Diagnosis not present

## 2015-09-29 DIAGNOSIS — I10 Essential (primary) hypertension: Secondary | ICD-10-CM | POA: Diagnosis not present

## 2015-09-29 DIAGNOSIS — E785 Hyperlipidemia, unspecified: Secondary | ICD-10-CM | POA: Diagnosis not present

## 2015-09-29 DIAGNOSIS — Z48815 Encounter for surgical aftercare following surgery on the digestive system: Secondary | ICD-10-CM | POA: Diagnosis not present

## 2015-09-29 DIAGNOSIS — Z87891 Personal history of nicotine dependence: Secondary | ICD-10-CM | POA: Diagnosis not present

## 2015-09-29 DIAGNOSIS — M160 Bilateral primary osteoarthritis of hip: Secondary | ICD-10-CM | POA: Diagnosis not present

## 2015-09-30 DIAGNOSIS — E785 Hyperlipidemia, unspecified: Secondary | ICD-10-CM | POA: Diagnosis not present

## 2015-09-30 DIAGNOSIS — I10 Essential (primary) hypertension: Secondary | ICD-10-CM | POA: Diagnosis not present

## 2015-09-30 DIAGNOSIS — Z7982 Long term (current) use of aspirin: Secondary | ICD-10-CM | POA: Diagnosis not present

## 2015-09-30 DIAGNOSIS — E119 Type 2 diabetes mellitus without complications: Secondary | ICD-10-CM | POA: Diagnosis not present

## 2015-09-30 DIAGNOSIS — M160 Bilateral primary osteoarthritis of hip: Secondary | ICD-10-CM | POA: Diagnosis not present

## 2015-09-30 DIAGNOSIS — Z87891 Personal history of nicotine dependence: Secondary | ICD-10-CM | POA: Diagnosis not present

## 2015-09-30 DIAGNOSIS — K589 Irritable bowel syndrome without diarrhea: Secondary | ICD-10-CM | POA: Diagnosis not present

## 2015-09-30 DIAGNOSIS — K279 Peptic ulcer, site unspecified, unspecified as acute or chronic, without hemorrhage or perforation: Secondary | ICD-10-CM | POA: Diagnosis not present

## 2015-09-30 DIAGNOSIS — Z48815 Encounter for surgical aftercare following surgery on the digestive system: Secondary | ICD-10-CM | POA: Diagnosis not present

## 2015-10-01 DIAGNOSIS — T8189XA Other complications of procedures, not elsewhere classified, initial encounter: Secondary | ICD-10-CM | POA: Diagnosis not present

## 2015-10-01 DIAGNOSIS — E119 Type 2 diabetes mellitus without complications: Secondary | ICD-10-CM | POA: Diagnosis not present

## 2015-10-03 DIAGNOSIS — M160 Bilateral primary osteoarthritis of hip: Secondary | ICD-10-CM | POA: Diagnosis not present

## 2015-10-03 DIAGNOSIS — Z48815 Encounter for surgical aftercare following surgery on the digestive system: Secondary | ICD-10-CM | POA: Diagnosis not present

## 2015-10-03 DIAGNOSIS — E785 Hyperlipidemia, unspecified: Secondary | ICD-10-CM | POA: Diagnosis not present

## 2015-10-03 DIAGNOSIS — K279 Peptic ulcer, site unspecified, unspecified as acute or chronic, without hemorrhage or perforation: Secondary | ICD-10-CM | POA: Diagnosis not present

## 2015-10-03 DIAGNOSIS — K589 Irritable bowel syndrome without diarrhea: Secondary | ICD-10-CM | POA: Diagnosis not present

## 2015-10-03 DIAGNOSIS — Z7982 Long term (current) use of aspirin: Secondary | ICD-10-CM | POA: Diagnosis not present

## 2015-10-03 DIAGNOSIS — E119 Type 2 diabetes mellitus without complications: Secondary | ICD-10-CM | POA: Diagnosis not present

## 2015-10-03 DIAGNOSIS — Z87891 Personal history of nicotine dependence: Secondary | ICD-10-CM | POA: Diagnosis not present

## 2015-10-03 DIAGNOSIS — I10 Essential (primary) hypertension: Secondary | ICD-10-CM | POA: Diagnosis not present

## 2015-10-05 DIAGNOSIS — Z87891 Personal history of nicotine dependence: Secondary | ICD-10-CM | POA: Diagnosis not present

## 2015-10-05 DIAGNOSIS — K589 Irritable bowel syndrome without diarrhea: Secondary | ICD-10-CM | POA: Diagnosis not present

## 2015-10-05 DIAGNOSIS — K279 Peptic ulcer, site unspecified, unspecified as acute or chronic, without hemorrhage or perforation: Secondary | ICD-10-CM | POA: Diagnosis not present

## 2015-10-05 DIAGNOSIS — M160 Bilateral primary osteoarthritis of hip: Secondary | ICD-10-CM | POA: Diagnosis not present

## 2015-10-05 DIAGNOSIS — Z7982 Long term (current) use of aspirin: Secondary | ICD-10-CM | POA: Diagnosis not present

## 2015-10-05 DIAGNOSIS — E119 Type 2 diabetes mellitus without complications: Secondary | ICD-10-CM | POA: Diagnosis not present

## 2015-10-05 DIAGNOSIS — Z48815 Encounter for surgical aftercare following surgery on the digestive system: Secondary | ICD-10-CM | POA: Diagnosis not present

## 2015-10-05 DIAGNOSIS — I10 Essential (primary) hypertension: Secondary | ICD-10-CM | POA: Diagnosis not present

## 2015-10-05 DIAGNOSIS — E785 Hyperlipidemia, unspecified: Secondary | ICD-10-CM | POA: Diagnosis not present

## 2015-10-11 DIAGNOSIS — Z48815 Encounter for surgical aftercare following surgery on the digestive system: Secondary | ICD-10-CM | POA: Diagnosis not present

## 2015-10-11 DIAGNOSIS — K279 Peptic ulcer, site unspecified, unspecified as acute or chronic, without hemorrhage or perforation: Secondary | ICD-10-CM | POA: Diagnosis not present

## 2015-10-11 DIAGNOSIS — K589 Irritable bowel syndrome without diarrhea: Secondary | ICD-10-CM | POA: Diagnosis not present

## 2015-10-11 DIAGNOSIS — E119 Type 2 diabetes mellitus without complications: Secondary | ICD-10-CM | POA: Diagnosis not present

## 2015-10-11 DIAGNOSIS — E785 Hyperlipidemia, unspecified: Secondary | ICD-10-CM | POA: Diagnosis not present

## 2015-10-11 DIAGNOSIS — I10 Essential (primary) hypertension: Secondary | ICD-10-CM | POA: Diagnosis not present

## 2015-10-11 DIAGNOSIS — M160 Bilateral primary osteoarthritis of hip: Secondary | ICD-10-CM | POA: Diagnosis not present

## 2015-10-11 DIAGNOSIS — Z87891 Personal history of nicotine dependence: Secondary | ICD-10-CM | POA: Diagnosis not present

## 2015-10-11 DIAGNOSIS — Z7982 Long term (current) use of aspirin: Secondary | ICD-10-CM | POA: Diagnosis not present

## 2015-10-17 ENCOUNTER — Ambulatory Visit (INDEPENDENT_AMBULATORY_CARE_PROVIDER_SITE_OTHER): Payer: Medicare Other | Admitting: *Deleted

## 2015-10-17 DIAGNOSIS — K279 Peptic ulcer, site unspecified, unspecified as acute or chronic, without hemorrhage or perforation: Secondary | ICD-10-CM | POA: Diagnosis not present

## 2015-10-17 DIAGNOSIS — I639 Cerebral infarction, unspecified: Secondary | ICD-10-CM | POA: Diagnosis not present

## 2015-10-17 DIAGNOSIS — E119 Type 2 diabetes mellitus without complications: Secondary | ICD-10-CM | POA: Diagnosis not present

## 2015-10-17 DIAGNOSIS — Z48815 Encounter for surgical aftercare following surgery on the digestive system: Secondary | ICD-10-CM | POA: Diagnosis not present

## 2015-10-17 DIAGNOSIS — M160 Bilateral primary osteoarthritis of hip: Secondary | ICD-10-CM | POA: Diagnosis not present

## 2015-10-17 DIAGNOSIS — Z87891 Personal history of nicotine dependence: Secondary | ICD-10-CM | POA: Diagnosis not present

## 2015-10-17 DIAGNOSIS — K589 Irritable bowel syndrome without diarrhea: Secondary | ICD-10-CM | POA: Diagnosis not present

## 2015-10-17 DIAGNOSIS — I10 Essential (primary) hypertension: Secondary | ICD-10-CM | POA: Diagnosis not present

## 2015-10-17 DIAGNOSIS — E785 Hyperlipidemia, unspecified: Secondary | ICD-10-CM | POA: Diagnosis not present

## 2015-10-17 DIAGNOSIS — Z7982 Long term (current) use of aspirin: Secondary | ICD-10-CM | POA: Diagnosis not present

## 2015-10-17 NOTE — Progress Notes (Signed)
Carelink Summary Report / Loop Recorder 

## 2015-10-24 ENCOUNTER — Other Ambulatory Visit: Payer: Self-pay | Admitting: Internal Medicine

## 2015-10-26 DIAGNOSIS — E785 Hyperlipidemia, unspecified: Secondary | ICD-10-CM | POA: Diagnosis not present

## 2015-10-26 DIAGNOSIS — Z87891 Personal history of nicotine dependence: Secondary | ICD-10-CM | POA: Diagnosis not present

## 2015-10-26 DIAGNOSIS — Z48815 Encounter for surgical aftercare following surgery on the digestive system: Secondary | ICD-10-CM | POA: Diagnosis not present

## 2015-10-26 DIAGNOSIS — I10 Essential (primary) hypertension: Secondary | ICD-10-CM | POA: Diagnosis not present

## 2015-10-26 DIAGNOSIS — K279 Peptic ulcer, site unspecified, unspecified as acute or chronic, without hemorrhage or perforation: Secondary | ICD-10-CM | POA: Diagnosis not present

## 2015-10-26 DIAGNOSIS — E119 Type 2 diabetes mellitus without complications: Secondary | ICD-10-CM | POA: Diagnosis not present

## 2015-10-26 DIAGNOSIS — K589 Irritable bowel syndrome without diarrhea: Secondary | ICD-10-CM | POA: Diagnosis not present

## 2015-10-26 DIAGNOSIS — M160 Bilateral primary osteoarthritis of hip: Secondary | ICD-10-CM | POA: Diagnosis not present

## 2015-10-26 DIAGNOSIS — Z7982 Long term (current) use of aspirin: Secondary | ICD-10-CM | POA: Diagnosis not present

## 2015-11-01 ENCOUNTER — Other Ambulatory Visit: Payer: Self-pay | Admitting: Internal Medicine

## 2015-11-03 ENCOUNTER — Other Ambulatory Visit: Payer: Self-pay | Admitting: Internal Medicine

## 2015-11-05 ENCOUNTER — Other Ambulatory Visit: Payer: Self-pay | Admitting: Internal Medicine

## 2015-11-08 ENCOUNTER — Other Ambulatory Visit: Payer: Self-pay | Admitting: Internal Medicine

## 2015-11-08 LAB — CUP PACEART REMOTE DEVICE CHECK: MDC IDC SESS DTM: 20170716115917

## 2015-11-09 NOTE — Telephone Encounter (Signed)
Should the patient still be taking this? Please advise. Thanks, MI

## 2015-11-10 NOTE — Telephone Encounter (Signed)
Spoke with patient and she stated that she did not take it for about two months due to having colon surgery. She stated that she has now resumed taking it at 20 mg qd. Ok to go ahead and refill?

## 2015-11-10 NOTE — Telephone Encounter (Signed)
OK to refill

## 2015-11-10 NOTE — Telephone Encounter (Signed)
It looks like he started her on this in 2015, please call to confirm how she is taking this med. If once daily- ok to refill.

## 2015-11-15 ENCOUNTER — Ambulatory Visit (INDEPENDENT_AMBULATORY_CARE_PROVIDER_SITE_OTHER): Payer: Medicare Other | Admitting: *Deleted

## 2015-11-15 DIAGNOSIS — I639 Cerebral infarction, unspecified: Secondary | ICD-10-CM

## 2015-11-15 NOTE — Progress Notes (Signed)
Carelink Summary Report / Loop Recorder 

## 2015-12-08 ENCOUNTER — Other Ambulatory Visit: Payer: Self-pay | Admitting: Internal Medicine

## 2015-12-13 LAB — CUP PACEART REMOTE DEVICE CHECK: Date Time Interrogation Session: 20170816013847

## 2015-12-15 ENCOUNTER — Ambulatory Visit (INDEPENDENT_AMBULATORY_CARE_PROVIDER_SITE_OTHER): Payer: Medicare Other | Admitting: *Deleted

## 2015-12-15 ENCOUNTER — Encounter: Payer: Medicare Other | Admitting: *Deleted

## 2015-12-15 DIAGNOSIS — I639 Cerebral infarction, unspecified: Secondary | ICD-10-CM

## 2015-12-16 NOTE — Progress Notes (Signed)
Carelink Summary Report / Loop Recorder 

## 2016-01-07 LAB — CUP PACEART REMOTE DEVICE CHECK: MDC IDC SESS DTM: 20170915020759

## 2016-01-07 NOTE — Progress Notes (Signed)
Carelink summary report received. Battery status OK. Normal device function. No new symptom episodes, tachy episodes, brady, or pause episodes. No new AF episodes. Monthly summary reports and ROV/PRN 

## 2016-01-16 ENCOUNTER — Ambulatory Visit (INDEPENDENT_AMBULATORY_CARE_PROVIDER_SITE_OTHER): Payer: Medicare Other | Admitting: *Deleted

## 2016-01-16 ENCOUNTER — Encounter: Payer: Medicare Other | Admitting: *Deleted

## 2016-01-16 DIAGNOSIS — I639 Cerebral infarction, unspecified: Secondary | ICD-10-CM | POA: Diagnosis not present

## 2016-01-16 NOTE — Progress Notes (Signed)
Carelink Summary Report / Loop Recorder 

## 2016-02-04 ENCOUNTER — Other Ambulatory Visit: Payer: Self-pay | Admitting: Internal Medicine

## 2016-02-13 ENCOUNTER — Telehealth: Payer: Self-pay | Admitting: *Deleted

## 2016-02-13 ENCOUNTER — Ambulatory Visit (INDEPENDENT_AMBULATORY_CARE_PROVIDER_SITE_OTHER): Payer: Medicare Other | Admitting: *Deleted

## 2016-02-13 DIAGNOSIS — I639 Cerebral infarction, unspecified: Secondary | ICD-10-CM | POA: Diagnosis not present

## 2016-02-13 NOTE — Telephone Encounter (Signed)
°  1. Has your device fired? no ° °2. Is you device beeping? no ° °3. Are you experiencing draining or swelling at device site? no ° °4. Are you calling to see if we received your device transmission? yes ° °5. Have you passed out? no ° °

## 2016-02-13 NOTE — Telephone Encounter (Signed)
Called pt and spoke with husband and instructed him on how to send in a remote transmission.

## 2016-02-14 NOTE — Progress Notes (Signed)
Carelink Summary Report / Loop Recorder 

## 2016-02-15 ENCOUNTER — Encounter: Payer: Self-pay | Admitting: Internal Medicine

## 2016-02-15 ENCOUNTER — Ambulatory Visit: Payer: Medicare Other | Admitting: Nurse Practitioner

## 2016-02-15 ENCOUNTER — Ambulatory Visit (INDEPENDENT_AMBULATORY_CARE_PROVIDER_SITE_OTHER): Payer: Medicare Other | Admitting: Internal Medicine

## 2016-02-15 VITALS — BP 102/54 | HR 61 | Temp 97.7°F | Resp 16 | Wt 173.0 lb

## 2016-02-15 DIAGNOSIS — R062 Wheezing: Secondary | ICD-10-CM | POA: Diagnosis not present

## 2016-02-15 DIAGNOSIS — R059 Cough, unspecified: Secondary | ICD-10-CM | POA: Insufficient documentation

## 2016-02-15 DIAGNOSIS — Z23 Encounter for immunization: Secondary | ICD-10-CM

## 2016-02-15 DIAGNOSIS — R05 Cough: Secondary | ICD-10-CM | POA: Diagnosis not present

## 2016-02-15 DIAGNOSIS — I10 Essential (primary) hypertension: Secondary | ICD-10-CM

## 2016-02-15 MED ORDER — HYDROCODONE-HOMATROPINE 5-1.5 MG/5ML PO SYRP: 5.0000 mL | ORAL_SOLUTION | Freq: Four times a day (QID) | ORAL | 0 refills | Status: AC | PRN

## 2016-02-15 MED ORDER — AZITHROMYCIN 250 MG PO TABS: ORAL_TABLET | ORAL | 1 refills | Status: DC

## 2016-02-15 NOTE — Assessment & Plan Note (Signed)
Few scattered on exam but without pt noting prior and no sob,  to f/u any worsening symptoms or concerns

## 2016-02-15 NOTE — Progress Notes (Signed)
Patient received education resource, including the self-management goal and tool. Patient verbalized understanding. 

## 2016-02-15 NOTE — Assessment & Plan Note (Signed)
Low normal, asympt, o/w stable overall by history and exam, recent data reviewed with pt, and pt to continue medical treatment as before,  to f/u any worsening symptoms or concerns  BP Readings from Last 3 Encounters:  02/15/16 (!) 102/54  09/01/15 (!) 145/56  08/25/15 136/77

## 2016-02-15 NOTE — Assessment & Plan Note (Signed)
Mild to mod, c/w bronchitis vs pna, declines cxr, for antibx course, to f/u any worsening symptoms or concerns  

## 2016-02-15 NOTE — Patient Instructions (Signed)
You had the flu shot today  Please take all new medication as prescribed - the antibiotic, and cough medicine as needed  Please continue all other medications as before, and refills have been done if requested.  Please have the pharmacy call with any other refills you may need.  Please keep your appointments with your specialists as you may have planned

## 2016-02-15 NOTE — Progress Notes (Signed)
Subjective:    Patient ID: Tiffany Wiggins, female    DOB: Dec 09, 1945, 70 y.o.   MRN: [id number]  HPI  Here with acute onset mild to mod 2-3 days ST, HA, general weakness and malaise, with prod cough greenish sputum, but Pt denies chest pain, increased sob or doe, wheezing, orthopnea, PND, increased LE swelling, palpitations, dizziness or syncope.  Pt denies new neurological symptoms such as new headache, or facial or extremity weakness or numbness   Pt denies polydipsia, polyuria Past Medical History:  Diagnosis Date  . Headache(784.0)   . Hyperglycemia   . Hyperlipidemia   . Hypertension    PCP GAVE FOR HER  BLOOD PRESSURE  . IBS (irritable bowel syndrome)   . PUD (peptic ulcer disease)   . Stroke [facility])    TIA  BACK IN  04/2004   Past Surgical History:  Procedure Laterality Date  . ABDOMINAL HYSTERECTOMY    . BIOPSY THYROID  Jan. 2016  . LAPAROSCOPY N/A 08/25/2015   Procedure: EXPLORATORY LAPAROTOMY, ILEOCECECTOMY, PRIMARY ANASTOMOSIS, LYSIS OF ADHESIONS;  Surgeon: Autumn Messing III, MD;  Location: [facility] ORS;  Service: General;  Laterality: N/A;  . laparoscopy for fertility work up    . LOOP RECORDER IMPLANT N/A 03/24/2014   Procedure: LOOP RECORDER IMPLANT;  Surgeon: Deboraha Sprang, MD;  Location: [facility] CATH LAB;  Service: Cardiovascular;  Laterality: N/A;  . LUMBAR LAMINECTOMY/DECOMPRESSION MICRODISCECTOMY  04/25/2011   Procedure: LUMBAR LAMINECTOMY/DECOMPRESSION MICRODISCECTOMY;  Surgeon: [name], MD;  Location: [place] NEURO ORS;  Service: Neurosurgery;  Laterality: Right;  Right Lumbar Four-Five Discectomy  . OOPHORECTOMY    . TONSILLECTOMY    . TONSILLECTOMY      reports that she quit smoking about 11 years ago. Her smoking use included Cigarettes. She has never used smokeless tobacco. She reports that she drinks about 1.2 oz of alcohol per week . She reports that she does not use drugs. family history includes Cancer in her brother and sister; Colon cancer in her paternal uncle;  Diabetes in her sister. Allergies  Allergen Reactions  . Codeine Nausea Only  . Morphine And Related Itching    Itching    Current Outpatient Prescriptions on File Prior to Visit  Medication Sig Dispense Refill  . acetaminophen (TYLENOL) 500 MG tablet Take 1,000 mg by mouth 2 (two) times daily.    Marland Kitchen amLODipine (NORVASC) 10 MG tablet TAKE ONE TABLET BY MOUTH ONCE DAILY 90 tablet 0  . aspirin 325 MG tablet Take 325 mg by mouth daily.    . Coenzyme Q10 (COQ10) 100 MG CAPS Take 1 tablet by mouth daily.    Marland Kitchen docusate sodium (COLACE) 100 MG capsule Take 1 capsule (100 mg total) by mouth daily as needed for mild constipation or moderate constipation. 30 capsule 2  . furosemide (LASIX) 20 MG tablet TAKE ONE TABLET BY MOUTH ONCE DAILY 90 tablet 2  . lisinopril (PRINIVIL,ZESTRIL) 40 MG tablet TAKE ONE TABLET BY MOUTH ONCE DAILY 90 tablet 0  . metFORMIN (GLUCOPHAGE) 500 MG tablet TAKE ONE TABLET BY MOUTH AT BEDTIME 90 tablet 2  . MIRALAX powder Take 8.5-34 g by mouth 2 (two) times daily. To correct constipation.  Adjust dose over 1-2 months.  Goal = ~1 bowel movement / day    . MULTIPLE VITAMIN PO Take 1 tablet by mouth daily.     . Multiple Vitamins-Minerals (VISION-VITE PRESERVE PO) Take 1 tablet by mouth 2 (two) times daily.    Marland Kitchen omeprazole (PRILOSEC) 20  MG capsule TAKE ONE CAPSULE BY MOUTH ONCE DAILY 90 capsule 0  . pravastatin (PRAVACHOL) 40 MG tablet Take 1 tablet (40 mg total) by mouth daily. 90 tablet 3  . Vitamin D, Cholecalciferol, 1000 units TABS Take 1 tablet by mouth daily.     No current facility-administered medications on file prior to visit.    Review of Systems  Constitutional: Negative for unusual diaphoresis or night sweats HENT: Negative for ear swelling or discharge Eyes: Negative for worsening visual haziness  Respiratory: Negative for choking and stridor.   Gastrointestinal: Negative for distension or worsening eructation Genitourinary: Negative for retention or change  in urine volume.  Musculoskeletal: Negative for other MSK pain or swelling Skin: Negative for color change and worsening wound Neurological: Negative for tremors and numbness other than noted  Psychiatric/Behavioral: Negative for decreased concentration or agitation other than above   All other system neg per pt    Objective:   Physical Exam BP (!) 102/54   Pulse 61   Temp 97.7 F (36.5 C) (Oral)   Resp 16   Wt 173 lb (78.5 kg)   SpO2 97%   BMI 27.92 kg/m  VS noted, mild ill Constitutional: Pt appears in no apparent distress HENT: Head: NCAT.  Right Ear: External ear normal.  Left Ear: External ear normal.  Eyes: . Pupils are equal, round, and reactive to light. Conjunctivae and EOM are normal Bilat tm's with mild erythema.  Max sinus areas mild tender.  Pharynx with mild erythema, no exudate Neck: Normal range of motion. Neck supple.  Cardiovascular: Normal rate and regular rhythm.   Pulmonary/Chest: Effort normal and breath sounds without rales with rare scattered wheeze Neurological: Pt is alert. Not confused , motor grossly intact Skin: Skin is warm. No rash, no LE edema Psychiatric: Pt behavior is normal. No agitation.     Assessment & Plan:

## 2016-02-18 LAB — CUP PACEART REMOTE DEVICE CHECK
Date Time Interrogation Session: 20171015030735
MDC IDC PG IMPLANT DT: 20151223

## 2016-02-18 NOTE — Progress Notes (Signed)
Carelink summary report received. Battery status OK. Normal device function. No new symptom episodes, tachy episodes, brady, or pause episodes. No new AF episodes. Monthly summary reports and ROV/PRN 

## 2016-02-25 ENCOUNTER — Other Ambulatory Visit: Payer: Self-pay | Admitting: Internal Medicine

## 2016-03-14 ENCOUNTER — Ambulatory Visit (INDEPENDENT_AMBULATORY_CARE_PROVIDER_SITE_OTHER): Payer: Medicare Other | Admitting: *Deleted

## 2016-03-14 DIAGNOSIS — I639 Cerebral infarction, unspecified: Secondary | ICD-10-CM

## 2016-03-15 ENCOUNTER — Other Ambulatory Visit: Payer: Self-pay | Admitting: Internal Medicine

## 2016-03-15 NOTE — Progress Notes (Signed)
Carelink Summary Report / Loop Recorder 

## 2016-03-28 LAB — CUP PACEART REMOTE DEVICE CHECK
Date Time Interrogation Session: 20171114050014
MDC IDC PG IMPLANT DT: 20151223

## 2016-04-13 ENCOUNTER — Ambulatory Visit (INDEPENDENT_AMBULATORY_CARE_PROVIDER_SITE_OTHER): Payer: Medicare Other | Admitting: *Deleted

## 2016-04-13 DIAGNOSIS — I639 Cerebral infarction, unspecified: Secondary | ICD-10-CM | POA: Diagnosis not present

## 2016-04-16 NOTE — Progress Notes (Signed)
Carelink Summary Report / Loop Recorder 

## 2016-05-02 ENCOUNTER — Telehealth: Payer: Self-pay | Admitting: Internal Medicine

## 2016-05-02 NOTE — Telephone Encounter (Signed)
Patient Name: Tiffany Wiggins  DOB: Oct 22, 1945    Initial Comment Caller states wife has been sick, running fever, cough, headache and is tired since Sunday. Has questions about cold and/or flu,   Nurse Assessment  Nurse: Martyn Ehrich, RN, Felicia Date/Time (Eastern Time): 05/02/2016 3:03:17 PM  Confirm and document reason for call. If symptomatic, describe symptoms. ---PT is sick with HA, fever and productive cough onset Sunday. She started feeling bad Nancy Fetter and fever started Monday night. Fatigue. HA went away. 100.4 oral  Does the patient have any new or worsening symptoms? ---Yes  Will a triage be completed? ---Yes  Related visit to physician within the last 2 weeks? ---No  Does the PT have any chronic conditions? (i.e. diabetes, asthma, etc.) ---Yes  List chronic conditions. ---DM (she is on metformin)  Is this a behavioral health or substance abuse call? ---No     Guidelines    Guideline Title Affirmed Question Affirmed Notes  Influenza - Seasonal Patient is HIGH RISK (e.g., age > 56 years, pregnant, HIV+, or chronic medical condition)    Final Disposition User   Call PCP Now Martyn Ehrich, RN, Felicia    Comments  Last May they removed polyps in intestine and removed 4 " of intestines - they said it would take a year to get back to normal  Now her temp is 98.6 orally -  pharmacy [facility] [place] [phone]- drink clear liquids green tea with splenda - NKDA - she is on the following medications: amlodipine, asa, Co Q 10, furosemide, lisinopril, metformin VI senior omeprazole pravastatin preservvision tylenol Vit D  They are not quite sure when fever started on Monday - but cough started sometime Mon and now there is no fever.  Vanessa Barbara in the office to consult to see if MD wants her to have tamiflu bc of her age - she had symptoms since Mon but now her fever went away. No trouble breathing. Pt could be schedule tomorrow if she wants to. Note flu symptoms on chart so they can give her a mask.  They said MD wont consider anything ordered over the phone  Told her if fever is 100.5 go to MD in 4 hours referenced influenza triage guide  they dont want to be around sick people an MDs office   Referrals  [place] UNDECIDED   Disagree/Comply: Comply

## 2016-05-04 ENCOUNTER — Ambulatory Visit (INDEPENDENT_AMBULATORY_CARE_PROVIDER_SITE_OTHER): Payer: Medicare Other | Admitting: Family Medicine

## 2016-05-04 ENCOUNTER — Encounter: Payer: Self-pay | Admitting: Family Medicine

## 2016-05-04 VITALS — BP 128/58 | HR 53 | Temp 98.3°F | Ht 66.0 in | Wt 175.4 lb

## 2016-05-04 DIAGNOSIS — M546 Pain in thoracic spine: Secondary | ICD-10-CM | POA: Diagnosis not present

## 2016-05-04 DIAGNOSIS — B9789 Other viral agents as the cause of diseases classified elsewhere: Secondary | ICD-10-CM

## 2016-05-04 DIAGNOSIS — J069 Acute upper respiratory infection, unspecified: Secondary | ICD-10-CM

## 2016-05-04 DIAGNOSIS — R002 Palpitations: Secondary | ICD-10-CM | POA: Diagnosis not present

## 2016-05-04 LAB — COMPREHENSIVE METABOLIC PANEL
ALBUMIN: 4.2 g/dL (ref 3.5–5.2)
ALT: 14 U/L (ref 0–35)
AST: 17 U/L (ref 0–37)
Alkaline Phosphatase: 66 U/L (ref 39–117)
BUN: 21 mg/dL (ref 6–23)
CALCIUM: 9.2 mg/dL (ref 8.4–10.5)
CHLORIDE: 108 meq/L (ref 96–112)
CO2: 27 mEq/L (ref 19–32)
Creatinine, Ser: 1.24 mg/dL — ABNORMAL HIGH (ref 0.40–1.20)
GFR: 45.57 mL/min — ABNORMAL LOW (ref >60.00)
Glucose, Bld: 118 mg/dL — ABNORMAL HIGH (ref 70–99)
POTASSIUM: 4.2 meq/L (ref 3.5–5.1)
SODIUM: 143 meq/L (ref 135–145)
Total Bilirubin: 0.3 mg/dL (ref 0.2–1.2)
Total Protein: 7.1 g/dL (ref 6.0–8.3)

## 2016-05-04 LAB — CBC WITH DIFFERENTIAL/PLATELET
BASOS PCT: 0.2 % (ref 0.0–3.0)
Basophils Absolute: 0 10*3/uL (ref 0.0–0.1)
EOS PCT: 1 % (ref 0.0–5.0)
Eosinophils Absolute: 0.1 10*3/uL (ref 0.0–0.7)
HEMATOCRIT: 33 % — AB (ref 36.0–46.0)
HEMOGLOBIN: 10.9 g/dL — AB (ref 12.0–15.0)
LYMPHS PCT: 47.1 % — AB (ref 12.0–46.0)
Lymphs Abs: 2.7 10*3/uL (ref 0.7–4.0)
MCHC: 33.1 g/dL (ref 30.0–36.0)
MCV: 97 fl (ref 78.0–100.0)
Monocytes Absolute: 0.4 10*3/uL (ref 0.1–1.0)
Monocytes Relative: 6.8 % (ref 3.0–12.0)
Neutro Abs: 2.5 10*3/uL (ref 1.4–7.7)
Neutrophils Relative %: 44.9 % (ref 43.0–77.0)
Platelets: 191 10*3/uL (ref 150.0–400.0)
RBC: 3.4 Mil/uL — ABNORMAL LOW (ref 3.87–5.11)
RDW: 13.1 % (ref 11.5–15.5)
WBC: 5.7 10*3/uL (ref 4.0–10.5)

## 2016-05-04 LAB — TSH: TSH: 1.54 u[IU]/mL (ref 0.35–4.50)

## 2016-05-04 MED ORDER — BENZONATATE 100 MG PO CAPS: 100.0000 mg | ORAL_CAPSULE | Freq: Three times a day (TID) | ORAL | 0 refills | Status: DC | PRN

## 2016-05-04 NOTE — Patient Instructions (Addendum)
If you start having worsening shortness of breath, chest pain, cold symptoms, jaw pain, or arm pain, seek care. Consider seeking care if you start having the "irregular heartbeat" as well. If this continues, let us know. Call Dr. Jens Som as well to update his office.  Continue to push fluids, practice good hand hygiene, and cover your mouth if you cough.  Heat and Tylenol for your back pain. I do not think this is related to your other issues.

## 2016-05-04 NOTE — Progress Notes (Signed)
Chief Complaint  Patient presents with  . Chest congestion    x 5 days-cough(clear)  . Irregular Heartbeat    noticed this am-also has low BP this am    Subjective: Patient is a 71 y.o. female here for palpitations.  Congestion- 5 days of congestion and cough. She feels that she is starting to get better. No fevers or shaking. She has been feeling fatigued as well.  Also notes "feeling off" for around 10-15 minutes both last night and this AM. She checked her blood pressure nad noted that it stated she had an irregular heart beat repeatedly after checking several times. She denies any associated chest pain, shortness of breath, arm pain, or jaw pain.  ROS: Heart: Denies chest pain, +palpitations Lungs: Denies SOB, +cough  Family History  Problem Relation Age of Onset  . Cancer Sister     lung  . Diabetes Sister   . Cancer Brother     lung  . Colon cancer Paternal Uncle    Past Medical History:  Diagnosis Date  . Headache(784.0)   . Hyperglycemia   . Hyperlipidemia   . Hypertension    PCP GAVE FOR HER  BLOOD PRESSURE  . IBS (irritable bowel syndrome)   . PUD (peptic ulcer disease)   . Stroke [facility])    TIA  BACK IN  04/2004   Allergies  Allergen Reactions  . Codeine Nausea Only  . Morphine And Related Itching    Itching     Current Outpatient Prescriptions:  .  acetaminophen (TYLENOL) 500 MG tablet, Take 1,000 mg by mouth 2 (two) times daily., Disp: , Rfl:  .  amLODipine (NORVASC) 10 MG tablet, TAKE ONE TABLET BY MOUTH ONCE DAILY, Disp: 90 tablet, Rfl: 1 .  aspirin EC 81 MG tablet, Take 81 mg by mouth daily., Disp: , Rfl:  .  Coenzyme Q10 (COQ10) 100 MG CAPS, Take 1 tablet by mouth daily., Disp: , Rfl:  .  furosemide (LASIX) 20 MG tablet, TAKE ONE TABLET BY MOUTH ONCE DAILY, Disp: 90 tablet, Rfl: 2 .  lisinopril (PRINIVIL,ZESTRIL) 40 MG tablet, TAKE ONE TABLET BY MOUTH ONCE DAILY, Disp: 90 tablet, Rfl: 0 .  metFORMIN (GLUCOPHAGE) 500 MG tablet, TAKE ONE TABLET BY  MOUTH AT BEDTIME, Disp: 90 tablet, Rfl: 2 .  MULTIPLE VITAMIN PO, Take 1 tablet by mouth daily. , Disp: , Rfl:  .  Multiple Vitamins-Minerals (VISION-VITE PRESERVE PO), Take 1 tablet by mouth 2 (two) times daily., Disp: , Rfl:  .  omeprazole (PRILOSEC) 20 MG capsule, TAKE ONE CAPSULE BY MOUTH ONCE DAILY, Disp: 90 capsule, Rfl: 0 .  pravastatin (PRAVACHOL) 40 MG tablet, Take 1 tablet (40 mg total) by mouth daily., Disp: 90 tablet, Rfl: 3 .  Vitamin D, Cholecalciferol, 1000 units TABS, Take 1 tablet by mouth daily., Disp: , Rfl:  .  benzonatate (TESSALON) 100 MG capsule, Take 1 capsule (100 mg total) by mouth 3 (three) times daily as needed., Disp: 30 capsule, Rfl: 0 .  docusate sodium (COLACE) 100 MG capsule, Take 1 capsule (100 mg total) by mouth daily as needed for mild constipation or moderate constipation. (Patient not taking: Reported on 05/04/2016), Disp: 30 capsule, Rfl: 2 .  MIRALAX powder, Take 8.5-34 g by mouth 2 (two) times daily. To correct constipation.  Adjust dose over 1-2 months.  Goal = ~1 bowel movement / day (Patient not taking: Reported on 05/04/2016), Disp: , Rfl:   Objective: BP (!) 128/58 (BP Location: Left Arm, Patient Position:  Sitting, Cuff Size: Normal)   Pulse (!) 53   Temp 98.3 F (36.8 C) (Oral)   Ht 5\' 6"  (1.676 m)   Wt 175 lb 6.4 oz (79.6 kg)   SpO2 98%   BMI 28.31 kg/m  General: Awake, appears stated age HEENT: MMM, EOMi Heart: RRR, no murmurs, no bruits Lungs: CTAB, no rales, wheezes or rhonchi. No accessory muscle use MSK: TTP over the L intercostal muscle in line with CVA at approx T8-10, no swelling Psych: Age appropriate judgment and insight, normal affect and mood  Assessment and Plan: Palpitations - Plan: TSH, Comprehensive metabolic panel, CBC w/Diff, EKG 12-Lead  Viral URI with cough - Plan: benzonatate (TESSALON) 100 MG capsule  Acute left-sided thoracic back pain  Orders as above. EKG is unremarkable other than sinus bradycardia. She is known  to have a slow heart beat.  She is not having exertional symptoms. If she continues to have this issue, she is to call her cardiologist or I will order a Holter. Will check some labs in meantime. If she has it again, she is to go to ER. ER if she has worsening symptoms, CP, SOB, fevers. F/u with reg PCP prn. The patient voiced understanding and agreement to the plan.  [place], DO 05/04/16  3:20 PM

## 2016-05-05 LAB — CUP PACEART REMOTE DEVICE CHECK
Date Time Interrogation Session: 20171214050550
MDC IDC PG IMPLANT DT: 20151223

## 2016-05-05 NOTE — Progress Notes (Signed)
Carelink summary report received. Battery status OK. Normal device function. No new symptom episodes, tachy episodes, brady, or pause episodes. No new AF episodes. Monthly summary reports and ROV/PRN 

## 2016-05-12 ENCOUNTER — Other Ambulatory Visit: Payer: Self-pay | Admitting: Internal Medicine

## 2016-05-14 ENCOUNTER — Ambulatory Visit (INDEPENDENT_AMBULATORY_CARE_PROVIDER_SITE_OTHER): Payer: Medicare Other | Admitting: *Deleted

## 2016-05-14 DIAGNOSIS — I639 Cerebral infarction, unspecified: Secondary | ICD-10-CM | POA: Diagnosis not present

## 2016-05-14 NOTE — Progress Notes (Signed)
Carelink Summary Report / Loop Recorder 

## 2016-05-18 ENCOUNTER — Encounter: Payer: Self-pay | Admitting: Family Medicine

## 2016-05-18 ENCOUNTER — Ambulatory Visit (INDEPENDENT_AMBULATORY_CARE_PROVIDER_SITE_OTHER): Payer: Medicare Other | Admitting: Family Medicine

## 2016-05-18 VITALS — BP 117/34 | HR 57 | Temp 97.8°F | Ht 66.0 in | Wt 179.4 lb

## 2016-05-18 DIAGNOSIS — D649 Anemia, unspecified: Secondary | ICD-10-CM

## 2016-05-18 LAB — CBC WITH DIFFERENTIAL/PLATELET
BASOS PCT: 0 %
Basophils Absolute: 0 cells/uL (ref 0–200)
EOS ABS: 174 {cells}/uL (ref 15–500)
Eosinophils Relative: 2 %
HEMATOCRIT: 31 % — AB (ref 35.0–45.0)
HEMOGLOBIN: 10.2 g/dL — AB (ref 11.7–15.5)
LYMPHS ABS: 2697 {cells}/uL (ref 850–3900)
LYMPHS PCT: 31 %
MCH: 31.7 pg (ref 27.0–33.0)
MCHC: 32.9 g/dL (ref 32.0–36.0)
MCV: 96.3 fL (ref 80.0–100.0)
MONO ABS: 435 {cells}/uL (ref 200–950)
MPV: 10.2 fL (ref 7.5–12.5)
Monocytes Relative: 5 %
NEUTROS ABS: 5394 {cells}/uL (ref 1500–7800)
Neutrophils Relative %: 62 %
Platelets: 258 10*3/uL (ref 140–400)
RBC: 3.22 MIL/uL — AB (ref 3.80–5.10)
RDW: 13.4 % (ref 11.0–15.0)
WBC: 8.7 10*3/uL (ref 3.8–10.8)

## 2016-05-18 LAB — FERRITIN: Ferritin: 55 ng/mL (ref 20–288)

## 2016-05-18 LAB — RETICULOCYTES
ABS RETIC: 45080 {cells}/uL (ref 20000–80000)
RBC.: 3.22 MIL/uL — AB (ref 3.80–5.10)
Retic Ct Pct: 1.4 %

## 2016-05-18 NOTE — Progress Notes (Signed)
Chief Complaint  Patient presents with  . Follow-up    Subjective: Patient is a 71 y.o. female here for f/u labs. Here with her husband.  Patient was noted to have a normocytic anemia from her visit on 05/04/16 with me. She denies any areas of easy bleeding or abnormal bruising. She has no family or personal history of bleeding disorders. She denies any dark tarry stools or blood in her stool. Her last colonoscopy was in May 2017. There was a precancerous polyp that led to a colonic perforation on an attempted biopsy. One year ago, her hemoglobin was in the normal range. She has been more fatigued than normal. She takes an aspirin daily. She does not take anti-inflammatories routinely.  ROS: GI: no blood in stool Heme: As noted in HPI  Family History  Problem Relation Age of Onset  . Cancer Sister     lung  . Diabetes Sister   . Cancer Brother     lung  . Colon cancer Paternal Uncle    Past Medical History:  Diagnosis Date  . Headache(784.0)   . Hyperglycemia   . Hyperlipidemia   . Hypertension    PCP GAVE FOR HER  BLOOD PRESSURE  . IBS (irritable bowel syndrome)   . PUD (peptic ulcer disease)   . Stroke [facility])    TIA  BACK IN  04/2004   Allergies  Allergen Reactions  . Codeine Nausea Only  . Morphine And Related Itching    Itching     Current Outpatient Prescriptions:  .  acetaminophen (TYLENOL) 500 MG tablet, Take 1,000 mg by mouth 2 (two) times daily., Disp: , Rfl:  .  amLODipine (NORVASC) 10 MG tablet, TAKE ONE TABLET BY MOUTH ONCE DAILY, Disp: 90 tablet, Rfl: 1 .  aspirin EC 81 MG tablet, Take 81 mg by mouth daily., Disp: , Rfl:  .  benzonatate (TESSALON) 100 MG capsule, Take 1 capsule (100 mg total) by mouth 3 (three) times daily as needed., Disp: 30 capsule, Rfl: 0 .  Coenzyme Q10 (COQ10) 100 MG CAPS, Take 1 tablet by mouth daily., Disp: , Rfl:  .  docusate sodium (COLACE) 100 MG capsule, Take 1 capsule (100 mg total) by mouth daily as needed for mild constipation  or moderate constipation., Disp: 30 capsule, Rfl: 2 .  furosemide (LASIX) 20 MG tablet, TAKE ONE TABLET BY MOUTH ONCE DAILY, Disp: 90 tablet, Rfl: 2 .  lisinopril (PRINIVIL,ZESTRIL) 40 MG tablet, TAKE ONE TABLET BY MOUTH ONCE DAILY, Disp: 90 tablet, Rfl: 0 .  metFORMIN (GLUCOPHAGE) 500 MG tablet, TAKE ONE TABLET BY MOUTH AT BEDTIME, Disp: 90 tablet, Rfl: 2 .  MULTIPLE VITAMIN PO, Take 1 tablet by mouth daily. , Disp: , Rfl:  .  Multiple Vitamins-Minerals (VISION-VITE PRESERVE PO), Take 1 tablet by mouth 2 (two) times daily., Disp: , Rfl:  .  omeprazole (PRILOSEC) 20 MG capsule, TAKE ONE CAPSULE BY MOUTH ONCE DAILY, Disp: 90 capsule, Rfl: 0 .  pravastatin (PRAVACHOL) 40 MG tablet, Take 1 tablet (40 mg total) by mouth daily., Disp: 90 tablet, Rfl: 3 .  Vitamin D, Cholecalciferol, 1000 units TABS, Take 1 tablet by mouth daily., Disp: , Rfl:   Objective: BP (!) 117/34 (BP Location: Left Arm, Patient Position: Sitting, Cuff Size: Large)   Pulse (!) 57   Temp 97.8 F (36.6 C) (Oral)   Ht 5\' 6"  (1.676 m)   Wt 179 lb 6.4 oz (81.4 kg)   SpO2 98%   BMI 28.96 kg/m  General: Awake, appears stated age 89: No scleral icterus subglossal jaundice Heart: RRR, no murmurs Lungs: CTAB, no rales, wheezes or rhonchi. No accessory muscle use Abd: BS+, soft, NT, ND, no masses or organomegaly, longitudinal scar noted centrally Psych: Age appropriate judgment and insight, normal affect and mood  Assessment and Plan: Normocytic anemia - Plan: CBC w/Diff, Retic, Haptoglobin, Pathologist smear review, IBC panel, Ferritin  Orders as above. Recommended she start taking supplemental iron while we await labs. Follow up pending the above results. The patient voiced understanding and agreement to the plan.  [place], DO 05/18/16  3:17 PM

## 2016-05-18 NOTE — Patient Instructions (Signed)
OK to try to take over the counter iron. 1 tab, three times daily. It will turn your stool dark.

## 2016-05-18 NOTE — Progress Notes (Signed)
Pre visit review using our clinic review tool, if applicable. No additional management support is needed unless otherwise documented below in the visit note. 

## 2016-05-21 LAB — HAPTOGLOBIN: HAPTOGLOBIN: 250 mg/dL — AB (ref 43–212)

## 2016-05-21 LAB — PATHOLOGIST SMEAR REVIEW

## 2016-05-24 LAB — IBC PANEL
%SAT: 23 % (ref 11–50)
TIBC: 329 ug/dL (ref 250–450)
UIBC: 252 ug/dL (ref 125–400)

## 2016-05-24 LAB — CUP PACEART REMOTE DEVICE CHECK
Implantable Pulse Generator Implant Date: 20151223
MDC IDC SESS DTM: 20180113050658

## 2016-05-24 LAB — IRON: Iron: 77 ug/dL (ref 45–160)

## 2016-05-24 NOTE — Addendum Note (Signed)
Addended by: Itta Bena Cellar on: 05/24/2016 08:53 AM   Modules accepted: Orders

## 2016-05-31 ENCOUNTER — Other Ambulatory Visit: Payer: Self-pay | Admitting: Internal Medicine

## 2016-06-05 LAB — CUP PACEART REMOTE DEVICE CHECK
Date Time Interrogation Session: 20180212051214
Implantable Pulse Generator Implant Date: 20151223

## 2016-06-05 NOTE — Progress Notes (Signed)
Carelink summary report received. Battery status OK. Normal device function. No new symptom episodes, tachy episodes, brady, or pause episodes. No new AF episodes. Monthly summary reports and ROV/PRN 

## 2016-06-06 ENCOUNTER — Telehealth: Payer: Self-pay | Admitting: Internal Medicine

## 2016-06-06 NOTE — Telephone Encounter (Signed)
That's fine. If OK with Dr. Sharlet Salina to switch, they do not need a transition of care visit.

## 2016-06-06 NOTE — Telephone Encounter (Signed)
Pt's spouse called in to transition care from Dr. Sharlet Salina to Dr. Nani Ravens. He says that Dr. Nani Ravens seen pt and they were very impressed.     Is switch okay?    If so, Dr Nani Ravens do pt need a 30 minute office visit to transition care?

## 2016-06-11 NOTE — Telephone Encounter (Signed)
Fine with me

## 2016-06-13 ENCOUNTER — Ambulatory Visit (INDEPENDENT_AMBULATORY_CARE_PROVIDER_SITE_OTHER): Payer: Medicare Other | Admitting: *Deleted

## 2016-06-13 DIAGNOSIS — I639 Cerebral infarction, unspecified: Secondary | ICD-10-CM | POA: Diagnosis not present

## 2016-06-13 NOTE — Progress Notes (Signed)
Carelink Summary Report / Loop Recorder 

## 2016-06-21 LAB — CUP PACEART REMOTE DEVICE CHECK
Date Time Interrogation Session: 20180314053750
Implantable Pulse Generator Implant Date: 20151223

## 2016-06-21 NOTE — Progress Notes (Signed)
Carelink summary report received. Battery status OK. Normal device function. No new symptom episodes, tachy episodes, brady, or pause episodes. No new AF episodes. Monthly summary reports and ROV/PRN 

## 2016-07-04 ENCOUNTER — Ambulatory Visit (INDEPENDENT_AMBULATORY_CARE_PROVIDER_SITE_OTHER): Payer: Medicare Other | Admitting: Internal Medicine

## 2016-07-04 ENCOUNTER — Encounter: Payer: Self-pay | Admitting: Internal Medicine

## 2016-07-04 VITALS — BP 130/64 | HR 58 | Ht 66.0 in | Wt 180.0 lb

## 2016-07-04 DIAGNOSIS — I639 Cerebral infarction, unspecified: Secondary | ICD-10-CM | POA: Diagnosis not present

## 2016-07-04 DIAGNOSIS — R001 Bradycardia, unspecified: Secondary | ICD-10-CM | POA: Diagnosis not present

## 2016-07-04 DIAGNOSIS — R079 Chest pain, unspecified: Secondary | ICD-10-CM

## 2016-07-04 DIAGNOSIS — R0989 Other specified symptoms and signs involving the circulatory and respiratory systems: Secondary | ICD-10-CM

## 2016-07-04 DIAGNOSIS — R011 Cardiac murmur, unspecified: Secondary | ICD-10-CM

## 2016-07-04 LAB — CUP PACEART INCLINIC DEVICE CHECK
Date Time Interrogation Session: 20180404150332
MDC IDC PG IMPLANT DT: 20151223

## 2016-07-04 NOTE — Patient Instructions (Addendum)
Medication Instructions: - Your physician recommends that you continue on your current medications as directed. Please refer to the Current Medication list given to you today.  Labwork: - none ordered  Procedures/Testing: - Your physician has requested that you have a carotid duplex. This test is an ultrasound of the carotid arteries in your neck. It looks at blood flow through these arteries that supply the brain with blood. Allow one hour for this exam. There are no restrictions or special instructions.  - Your physician has requested that you have an echocardiogram. Echocardiography is a painless test that uses sound waves to create images of your heart. It provides your doctor with information about the size and shape of your heart and how well your heart's chambers and valves are working. This procedure takes approximately one hour. There are no restrictions for this procedure.  - Your physician has requested that you have a lexiscan myoview. For further information please visit [email or web address]. Please follow instruction sheet, as given.  Follow-Up: - Your physician wants you to follow-up in: 1 year with Dr. Caryl Comes. You will receive a reminder letter in the mail two months in advance. If you don't receive a letter, please call our office to schedule the follow-up appointment.   Any Additional Special Instructions Will Be Listed Below (If Applicable).     If you need a refill on your cardiac medications before your next appointment, please call your pharmacy.

## 2016-07-04 NOTE — Progress Notes (Signed)
Patient Care Team: Shelda Pal, DO as PCP - General (Family Medicine) Autumn Messing III, MD as Consulting Physician (General Surgery) Mauri Pole, MD as Consulting Physician (Gastroenterology)   HPI  Tiffany Wiggins is a 71 y.o. female Seen in follow-up for history of palpitations with cryptogenic stroke is status post loop recorder insertion  There've been no interval arrhythmias.  She complains of peripheral edema. She takes diuretics when necessary. She also has had problems with cramps associated with statin therapy. In the past this prompted a switch from rosuvastatin--simvastatin  Exercise tolerance is limited more by her back ithan by her cardiovascular fitness  Complaining of exertional chest discomfort with some sob  Had undergone cath 2015 with no obstructive disease  Has hx of carotid disease,  Last dopplers 2014 normal on R and left page is missing  Records and Results Reviewed   Past Medical History:  Diagnosis Date  . Headache(784.0)   . Hyperglycemia   . Hyperlipidemia   . Hypertension    PCP GAVE FOR HER  BLOOD PRESSURE  . IBS (irritable bowel syndrome)   . PUD (peptic ulcer disease)   . Stroke [facility])    TIA  BACK IN  04/2004    Past Surgical History:  Procedure Laterality Date  . ABDOMINAL HYSTERECTOMY    . BIOPSY THYROID  Jan. 2016  . LAPAROSCOPY N/A 08/25/2015   Procedure: EXPLORATORY LAPAROTOMY, ILEOCECECTOMY, PRIMARY ANASTOMOSIS, LYSIS OF ADHESIONS;  Surgeon: Autumn Messing III, MD;  Location: [facility] ORS;  Service: General;  Laterality: N/A;  . laparoscopy for fertility work up    . LOOP RECORDER IMPLANT N/A 03/24/2014   Procedure: LOOP RECORDER IMPLANT;  Surgeon: Deboraha Sprang, MD;  Location: [facility] CATH LAB;  Service: Cardiovascular;  Laterality: N/A;  . LUMBAR LAMINECTOMY/DECOMPRESSION MICRODISCECTOMY  04/25/2011   Procedure: LUMBAR LAMINECTOMY/DECOMPRESSION MICRODISCECTOMY;  Surgeon: [name], MD;  Location: [place] NEURO ORS;   Service: Neurosurgery;  Laterality: Right;  Right Lumbar Four-Five Discectomy  . OOPHORECTOMY    . TONSILLECTOMY      Current Outpatient Prescriptions  Medication Sig Dispense Refill  . acetaminophen (TYLENOL) 500 MG tablet Take 1,000 mg by mouth 2 (two) times daily.    Marland Kitchen amLODipine (NORVASC) 10 MG tablet TAKE ONE TABLET BY MOUTH ONCE DAILY 90 tablet 1  . aspirin EC 81 MG tablet Take 81 mg by mouth daily.    . Coenzyme Q10 (COQ10) 100 MG CAPS Take 1 tablet by mouth daily.    Marland Kitchen docusate sodium (COLACE) 100 MG capsule Take 1 capsule (100 mg total) by mouth daily as needed for mild constipation or moderate constipation. 30 capsule 2  . furosemide (LASIX) 20 MG tablet TAKE ONE TABLET BY MOUTH ONCE DAILY 90 tablet 2  . lisinopril (PRINIVIL,ZESTRIL) 40 MG tablet TAKE ONE TABLET BY MOUTH ONCE DAILY 90 tablet 0  . metFORMIN (GLUCOPHAGE) 500 MG tablet TAKE ONE TABLET BY MOUTH AT BEDTIME 90 tablet 2  . MULTIPLE VITAMIN PO Take 1 tablet by mouth daily.     . Multiple Vitamins-Minerals (VISION-VITE PRESERVE PO) Take 1 tablet by mouth 2 (two) times daily.    Marland Kitchen omeprazole (PRILOSEC) 20 MG capsule TAKE ONE CAPSULE BY MOUTH ONCE DAILY 90 capsule 0  . pravastatin (PRAVACHOL) 40 MG tablet Take 1 tablet (40 mg total) by mouth daily. 90 tablet 3  . Vitamin D, Cholecalciferol, 1000 units TABS Take 1 tablet by mouth daily.     No current facility-administered  medications for this visit.     Allergies  Allergen Reactions  . Codeine Nausea Only  . Morphine And Related Itching    Itching       Review of Systems negative except from HPI and PMH  Physical Exam BP 130/64   Pulse (!) 58   Ht 5\' 6"  (1.676 m)   Wt 180 lb (81.6 kg)   SpO2 97%   BMI 29.05 kg/m  Well developed and well nourished in no acute distress HENT normal E scleral and icterus clear Neck Supple JVP flat; carotids brisk and full B Bruits R>L  Clear to ausculation  Regular rate and rhythm, 2/6 gallops or rub Soft with active  bowel sounds No clubbing cyanosis  Edema Alert and oriented, grossly normal motor and sensory function Skin Warm and Dry  ECG demonstrates sinus rhythm at 55 Intervals 15/08/42 Low Voltage  Assessment and  Plan Cryptogenic stroke  Implantable loop recorder  Hypertension  Sinus bradycardia  Chest discomfort  Exertional  Carotid bruits   Blood pressure is reasonably controlled  No interval atrial fibrillation  Chest discomfort is concerning although that level of concern is mitigated by her normal catheterization 2-1/2 years ago. With her essentially normal ECG standard treadmill testing should be sufficiently sensitive  She has significant carotid bruits and by report has some degree of obstruction although the last report I see from 2014 demonstrated no obstruction on the right. We will repeat her carotid Dopplers.  She has a newly identified murmur. S2 is preserved. I suspect that this may be aortic sclerosis

## 2016-07-10 IMAGING — CR DG SHOULDER 2+V*R*
3 series · 3 of 3 positions shown · non-contrast
Comparison: None.

CLINICAL DATA: Right shoulder pain following colonoscopy, no known
injury, initial encounter

EXAM:
RIGHT SHOULDER - 2+ VIEW

[w shoulder external right]
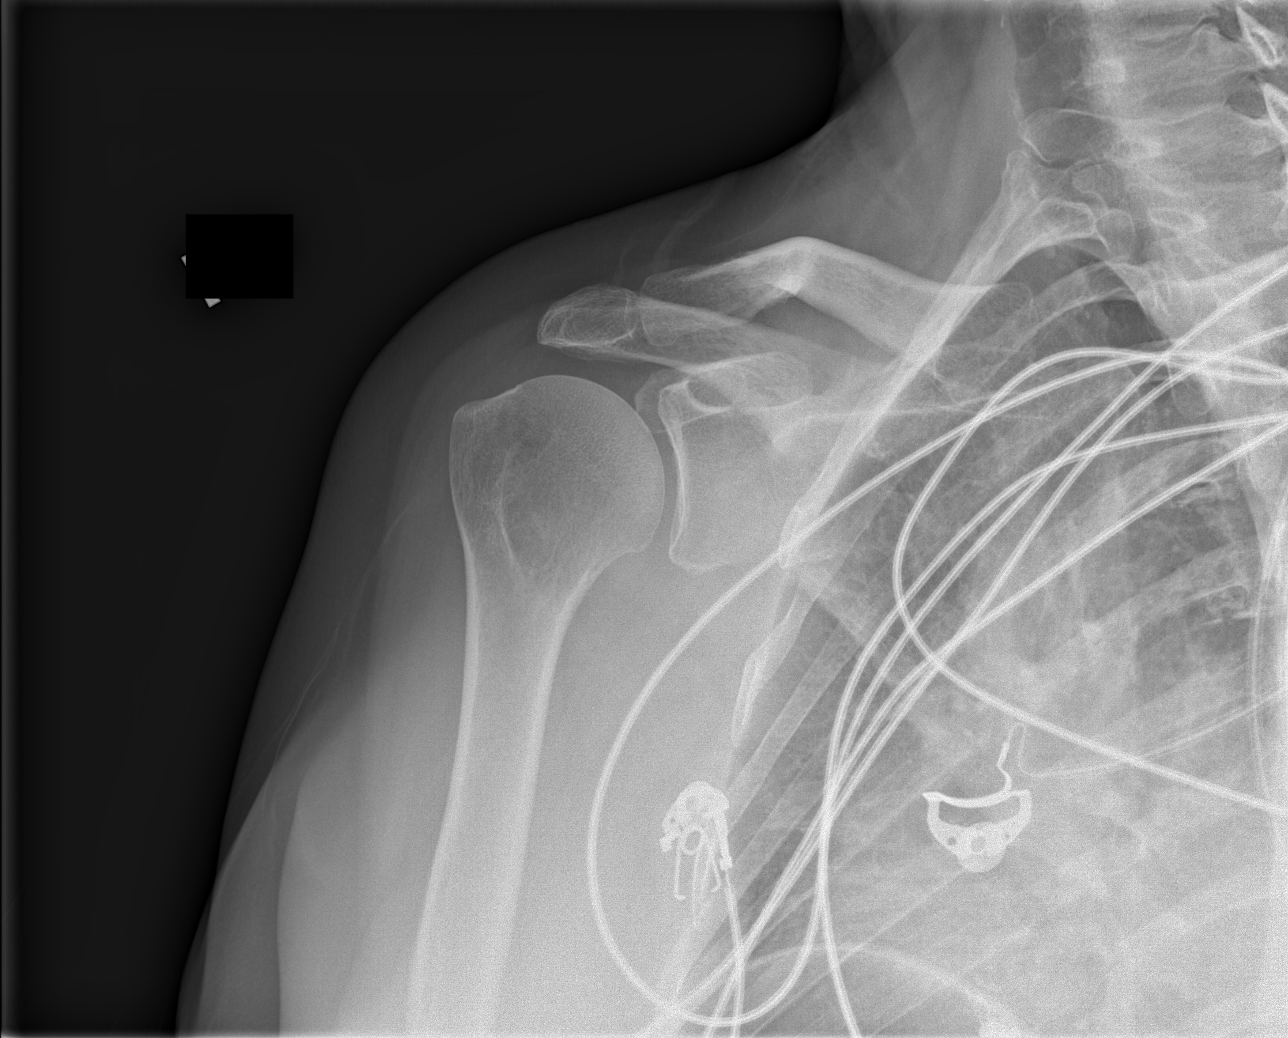

[w shoulder y-view right]
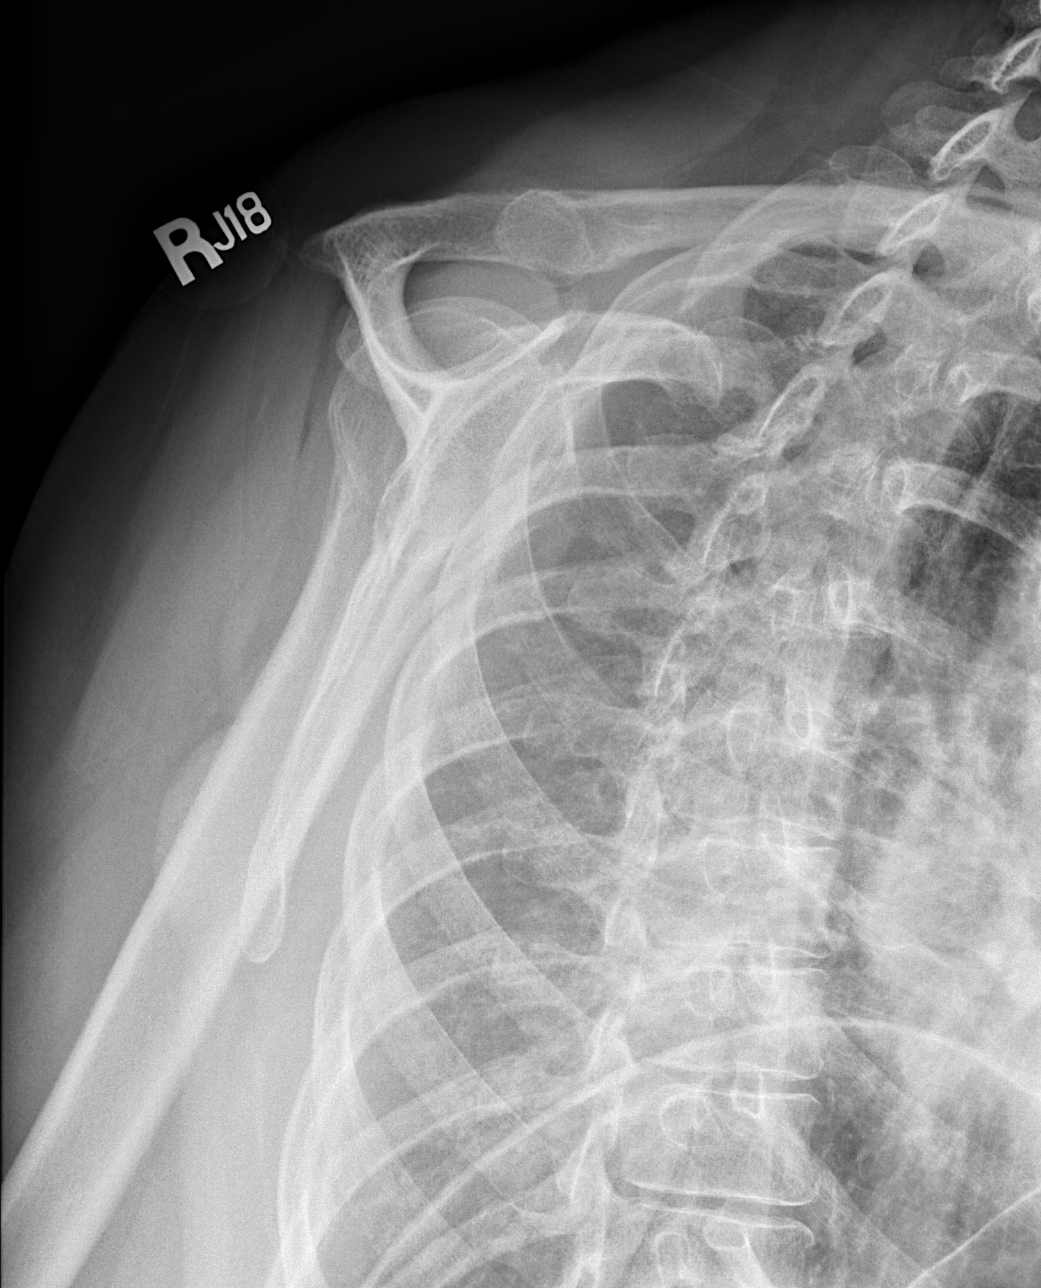

[x shoulder axillary right]
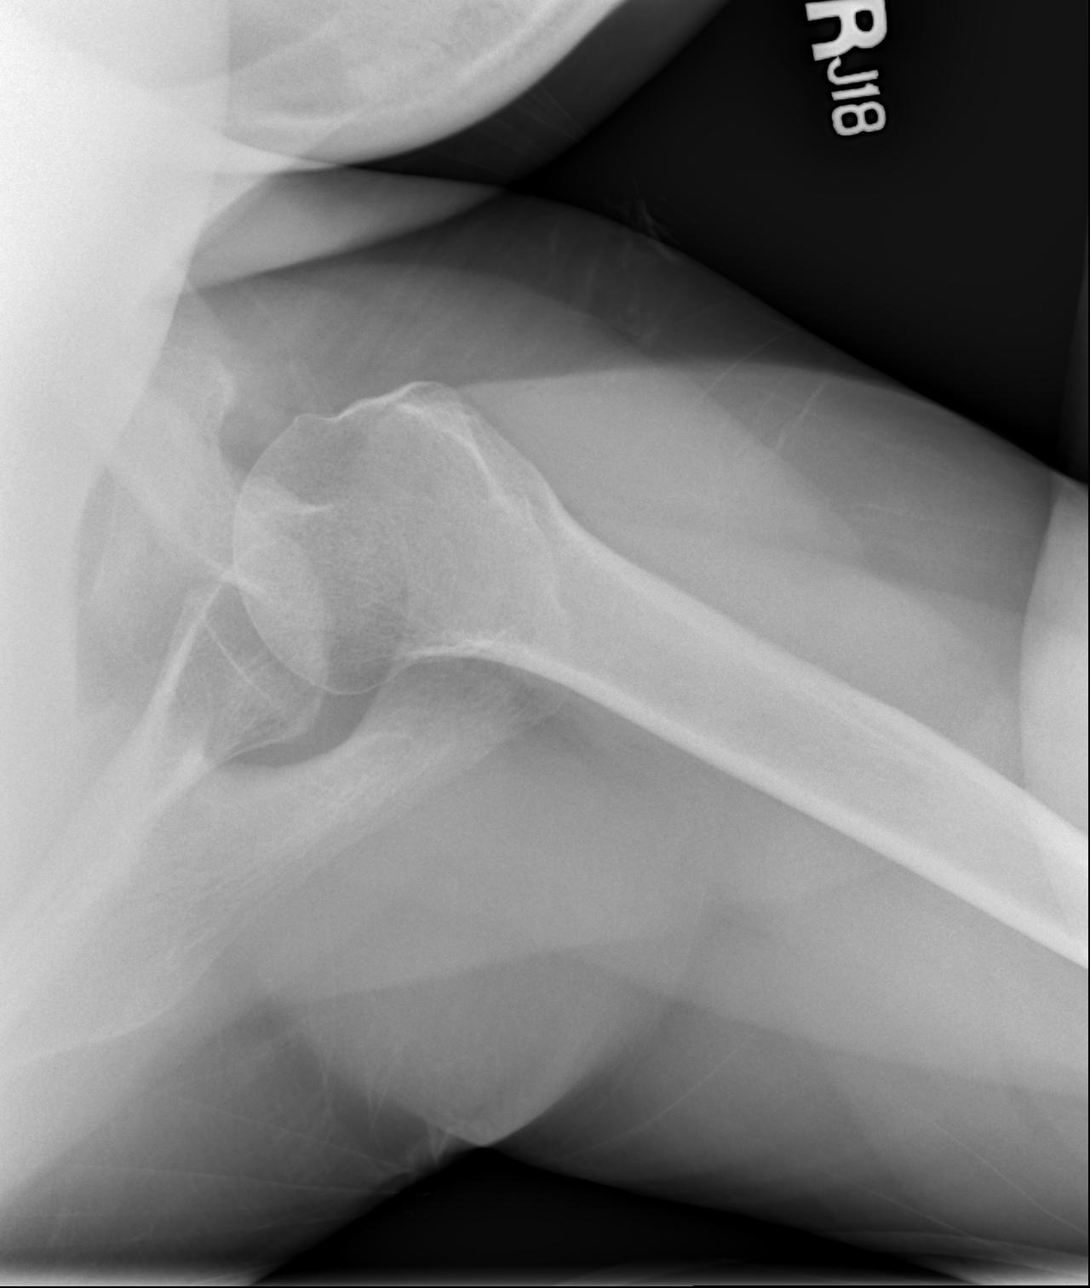

[3 of 3 positions shown; findings below may reference images not displayed]

FINDINGS: No acute fracture or dislocation is noted. The underlying bony
thorax is within normal limits. There is air beneath the right
hemidiaphragm. Given the recent colonoscopy E the possibility of
free air deserves consideration. Supine upright abdomen is
recommended for further evaluation.
IMPRESSION: No acute shoulder abnormality is noted.

Questionable free air under the right hemidiaphragm. This may simply
be related to distended colon from the recent colonoscopy although
perforation deserves consideration. The patient is scheduled have a
CTA of the chest and this area will likely be covered on upcoming
CT.

These results were called by telephone at the time of interpretation
on 08/25/2015 at [DATE] to GENOGAMENO ATICE, PA, who verbally
acknowledged these results.

## 2016-07-13 ENCOUNTER — Ambulatory Visit (INDEPENDENT_AMBULATORY_CARE_PROVIDER_SITE_OTHER): Payer: Medicare Other | Admitting: *Deleted

## 2016-07-13 DIAGNOSIS — I639 Cerebral infarction, unspecified: Secondary | ICD-10-CM

## 2016-07-13 NOTE — Progress Notes (Signed)
Carelink Summary Report / Loop Recorder 

## 2016-07-19 ENCOUNTER — Ambulatory Visit (HOSPITAL_COMMUNITY)
Admission: RE | Admit: 2016-07-19 | Discharge: 2016-07-19 | Disposition: A | Discharge status: Home / Self Care | Payer: Medicare Other | Source: Ambulatory Visit | Attending: Cardiology | Admitting: Cardiology

## 2016-07-19 DIAGNOSIS — I6523 Occlusion and stenosis of bilateral carotid arteries: Secondary | ICD-10-CM | POA: Insufficient documentation

## 2016-07-19 DIAGNOSIS — R0989 Other specified symptoms and signs involving the circulatory and respiratory systems: Secondary | ICD-10-CM | POA: Insufficient documentation

## 2016-07-23 ENCOUNTER — Telehealth (HOSPITAL_COMMUNITY): Payer: Self-pay | Admitting: *Deleted

## 2016-07-23 NOTE — Telephone Encounter (Signed)
Patient given detailed instructions per Myocardial Perfusion Study Information Sheet for the test on 07/25/16 at 0945. Patient notified to arrive 15 minutes early and that it is imperative to arrive on time for appointment to keep from having the test rescheduled.  If you need to cancel or reschedule your appointment, please call the office within 24 hours of your appointment. Failure to do so may result in a cancellation of your appointment, and a $50 no show fee. Patient verbalized understanding.Darianny Momon, Ranae Palms

## 2016-07-24 ENCOUNTER — Other Ambulatory Visit: Payer: Self-pay

## 2016-07-24 DIAGNOSIS — I669 Occlusion and stenosis of unspecified cerebral artery: Secondary | ICD-10-CM

## 2016-07-25 ENCOUNTER — Ambulatory Visit (HOSPITAL_BASED_OUTPATIENT_CLINIC_OR_DEPARTMENT_OTHER): Payer: Medicare Other

## 2016-07-25 ENCOUNTER — Other Ambulatory Visit: Payer: Self-pay

## 2016-07-25 ENCOUNTER — Ambulatory Visit (HOSPITAL_COMMUNITY): Payer: Medicare Other | Attending: Cardiology

## 2016-07-25 DIAGNOSIS — R079 Chest pain, unspecified: Secondary | ICD-10-CM | POA: Insufficient documentation

## 2016-07-25 DIAGNOSIS — I10 Essential (primary) hypertension: Secondary | ICD-10-CM | POA: Diagnosis not present

## 2016-07-25 DIAGNOSIS — I071 Rheumatic tricuspid insufficiency: Secondary | ICD-10-CM | POA: Diagnosis not present

## 2016-07-25 DIAGNOSIS — I371 Nonrheumatic pulmonary valve insufficiency: Secondary | ICD-10-CM | POA: Insufficient documentation

## 2016-07-25 DIAGNOSIS — E785 Hyperlipidemia, unspecified: Secondary | ICD-10-CM | POA: Insufficient documentation

## 2016-07-25 DIAGNOSIS — Z8673 Personal history of transient ischemic attack (TIA), and cerebral infarction without residual deficits: Secondary | ICD-10-CM | POA: Insufficient documentation

## 2016-07-25 DIAGNOSIS — R011 Cardiac murmur, unspecified: Secondary | ICD-10-CM | POA: Insufficient documentation

## 2016-07-25 LAB — MYOCARDIAL PERFUSION IMAGING
CHL CUP NUCLEAR SDS: 5
CSEPPHR: 85 {beats}/min
LHR: 0.39
LV dias vol: 76 mL (ref 46–106)
LV sys vol: 22 mL
NUC STRESS TID: 0.96
Rest HR: 50 {beats}/min
SRS: 6
SSS: 11

## 2016-07-25 LAB — CUP PACEART REMOTE DEVICE CHECK
Date Time Interrogation Session: 20180413061819
Implantable Pulse Generator Implant Date: 20151223

## 2016-07-25 LAB — ECHOCARDIOGRAM COMPLETE
Height: 66 in
Weight: 2880 oz

## 2016-07-25 MED ORDER — REGADENOSON 0.4 MG/5ML IV SOLN
0.4000 mg | Freq: Once | INTRAVENOUS | Status: AC
  Administered 2016-07-25: 0.4 mg via INTRAVENOUS

## 2016-07-25 MED ORDER — TECHNETIUM TC 99M TETROFOSMIN IV KIT
33.0000 | PACK | Freq: Once | INTRAVENOUS | Status: AC | PRN
  Administered 2016-07-25: 33 via INTRAVENOUS
  Filled 2016-07-25: qty 33

## 2016-07-25 MED ORDER — TECHNETIUM TC 99M TETROFOSMIN IV KIT
10.6000 | PACK | Freq: Once | INTRAVENOUS | Status: AC | PRN
  Administered 2016-07-25: 10.6 via INTRAVENOUS
  Filled 2016-07-25: qty 11

## 2016-08-02 ENCOUNTER — Telehealth: Payer: Self-pay

## 2016-08-02 NOTE — Telephone Encounter (Signed)
Patient is aware and agreeable to normal echo.

## 2016-08-13 ENCOUNTER — Other Ambulatory Visit (INDEPENDENT_AMBULATORY_CARE_PROVIDER_SITE_OTHER): Payer: Medicare Other

## 2016-08-13 ENCOUNTER — Encounter: Payer: Medicare Other | Admitting: Family Medicine

## 2016-08-13 ENCOUNTER — Ambulatory Visit (INDEPENDENT_AMBULATORY_CARE_PROVIDER_SITE_OTHER): Payer: Medicare Other | Admitting: *Deleted

## 2016-08-13 ENCOUNTER — Other Ambulatory Visit: Payer: Self-pay | Admitting: Family Medicine

## 2016-08-13 DIAGNOSIS — D649 Anemia, unspecified: Secondary | ICD-10-CM

## 2016-08-13 DIAGNOSIS — I639 Cerebral infarction, unspecified: Secondary | ICD-10-CM | POA: Diagnosis not present

## 2016-08-13 LAB — CBC WITH DIFFERENTIAL/PLATELET
BASOS ABS: 0 10*3/uL (ref 0.0–0.1)
Basophils Relative: 0.5 % (ref 0.0–3.0)
EOS ABS: 0.2 10*3/uL (ref 0.0–0.7)
Eosinophils Relative: 1.9 % (ref 0.0–5.0)
HCT: 33.6 % — ABNORMAL LOW (ref 36.0–46.0)
Hemoglobin: 11.1 g/dL — ABNORMAL LOW (ref 12.0–15.0)
LYMPHS ABS: 2.7 10*3/uL (ref 0.7–4.0)
Lymphocytes Relative: 34.7 % (ref 12.0–46.0)
MCHC: 32.9 g/dL (ref 30.0–36.0)
MCV: 98.3 fl (ref 78.0–100.0)
MONO ABS: 0.4 10*3/uL (ref 0.1–1.0)
Monocytes Relative: 5.2 % (ref 3.0–12.0)
NEUTROS ABS: 4.6 10*3/uL (ref 1.4–7.7)
NEUTROS PCT: 57.7 % (ref 43.0–77.0)
PLATELETS: 218 10*3/uL (ref 150.0–400.0)
RBC: 3.42 Mil/uL — ABNORMAL LOW (ref 3.87–5.11)
RDW: 13.3 % (ref 11.5–15.5)
WBC: 7.9 10*3/uL (ref 4.0–10.5)

## 2016-08-13 LAB — FOLATE: Folate: >24 ng/mL

## 2016-08-13 LAB — VITAMIN B12: Vitamin B-12: 328 pg/mL (ref 211–911)

## 2016-08-13 NOTE — Progress Notes (Signed)
Carelink Summary Report / Loop Recorder 

## 2016-08-14 NOTE — Progress Notes (Signed)
Erroneous encounter, please disregard

## 2016-08-15 ENCOUNTER — Other Ambulatory Visit: Payer: Self-pay | Admitting: *Deleted

## 2016-08-15 MED ORDER — METFORMIN HCL 500 MG PO TABS: 500.0000 mg | ORAL_TABLET | Freq: Every day | ORAL | 1 refills | Status: DC

## 2016-08-15 NOTE — Telephone Encounter (Signed)
Rx sent to the pharmacy by e-script.//AB/CMA 

## 2016-08-17 LAB — CUP PACEART REMOTE DEVICE CHECK
Implantable Pulse Generator Implant Date: 20151223
MDC IDC SESS DTM: 20180513063739

## 2016-08-20 ENCOUNTER — Other Ambulatory Visit: Payer: Self-pay | Admitting: Internal Medicine

## 2016-08-20 ENCOUNTER — Telehealth: Payer: Self-pay | Admitting: Family Medicine

## 2016-08-20 MED ORDER — PRAVASTATIN SODIUM 40 MG PO TABS: 40.0000 mg | ORAL_TABLET | Freq: Every day | ORAL | 0 refills | Status: DC

## 2016-08-20 NOTE — Telephone Encounter (Addendum)
Called and spoke with the pt's husband and informed him that I refill the Pravastatin but the pt is going to need a office visit with blood work to check her cholesterol.  Also informed him that the Omeprazole had been filled today by Dr. Sharlet Salina but a note also went to the pharmacy stating that the pt is needing an office visit.    He verbalized understanding and agreed to schedule an appointment.  Rx sent to the pharmacy by e-script.//AB/CMA

## 2016-08-20 NOTE — Telephone Encounter (Signed)
Caller name: Ethelmae Ringel Relationship to patient: spouse Can be reached: [phone] Pharmacy: [place], [place]  Reason for call: Pt husband called because [facility] stated they sent requests for pravastatin and omeprazole to Dr. Nani Ravens last Wednesday 08/15/16. Pt is out of meds now. She is needing 90 day supply meds sent in. I did not see electronic request for pravastatin but the omeprazole was sent today but to Dr. Sharlet Salina in error (old PCP). Please have sent in today.

## 2016-08-20 NOTE — Telephone Encounter (Signed)
Called and [facility] @ 4:23pm @ [phone]) asking the pt to RTC regarding medication refill request.//AB/CMA

## 2016-09-04 ENCOUNTER — Other Ambulatory Visit: Payer: Self-pay | Admitting: Internal Medicine

## 2016-09-04 NOTE — Telephone Encounter (Signed)
Different provider

## 2016-09-05 ENCOUNTER — Ambulatory Visit: Payer: Medicare Other | Admitting: Family Medicine

## 2016-09-07 ENCOUNTER — Ambulatory Visit (INDEPENDENT_AMBULATORY_CARE_PROVIDER_SITE_OTHER): Payer: Medicare Other | Admitting: Family Medicine

## 2016-09-07 ENCOUNTER — Encounter: Payer: Self-pay | Admitting: Family Medicine

## 2016-09-07 VITALS — BP 120/50 | HR 57 | Temp 98.0°F | Ht 66.0 in | Wt 183.0 lb

## 2016-09-07 DIAGNOSIS — M542 Cervicalgia: Secondary | ICD-10-CM | POA: Diagnosis not present

## 2016-09-07 DIAGNOSIS — H9202 Otalgia, left ear: Secondary | ICD-10-CM | POA: Diagnosis not present

## 2016-09-07 DIAGNOSIS — R200 Anesthesia of skin: Secondary | ICD-10-CM

## 2016-09-07 DIAGNOSIS — G8929 Other chronic pain: Secondary | ICD-10-CM

## 2016-09-07 DIAGNOSIS — D649 Anemia, unspecified: Secondary | ICD-10-CM | POA: Diagnosis not present

## 2016-09-07 DIAGNOSIS — Z9109 Other allergy status, other than to drugs and biological substances: Secondary | ICD-10-CM

## 2016-09-07 DIAGNOSIS — M546 Pain in thoracic spine: Secondary | ICD-10-CM

## 2016-09-07 DIAGNOSIS — R202 Paresthesia of skin: Secondary | ICD-10-CM | POA: Diagnosis not present

## 2016-09-07 LAB — TSH: TSH: 0.83 mIU/L

## 2016-09-07 MED ORDER — FLUTICASONE PROPIONATE 50 MCG/ACT NA SUSP: 2.0000 | Freq: Every day | NASAL | 6 refills | Status: DC

## 2016-09-07 NOTE — Progress Notes (Signed)
Musculoskeletal Exam  Patient: Tiffany Wiggins DOB: [birth year]  DOS: 09/07/2016  SUBJECTIVE:  Chief Complaint:   Chief Complaint  Patient presents with  . Follow-up    pt want to discuss neck pain,having numbness under (B) feet, h/a on the (L) side.    Tiffany Wiggins is a 71 y.o.  female for evaluation and treatment of neck pain.   Onset:  6 months ago. No injury or change in activity.  Location: [place]   Character:  aching  Progression of issue:  has worsened slightly Associated symptoms: It has been popping Treatment: to date has been none.   Neurovascular symptoms: no  L ear pain over past 3 mo, will sometimes sleep with watch. Nothing is draining and her hearing is unchanged. She thinks she may be sleeping on her watch.  Decreased sensation over both feet.This is been going on for several months. She feels it in her second, third, and fourth toe pads bilaterally. No pain, numbness, or tingling, just decreased sensation. She does take metformin for history of elevated sugar, however has never been told she has diabetes. No injury.  Mid back pain This is been going on for around a year, she has pain in the middle of her back. There is no injury or change in activity. She does acknowledge that she slouches. She has not tried anything at home for this. No numbness, tingling (other than her toes), weakness, or loss of bowel/bladder function.  ROS: Musculoskeletal/Extremities: +neck and back pain Neurologic: +numbness of toes, no weakness  Past Medical History:  Diagnosis Date  . Headache(784.0)   . Hyperglycemia   . Hyperlipidemia   . Hypertension    PCP GAVE FOR HER  BLOOD PRESSURE  . IBS (irritable bowel syndrome)   . PUD (peptic ulcer disease)   . Stroke [facility])    TIA  BACK IN  04/2004   Past Surgical History:  Procedure Laterality Date  . ABDOMINAL HYSTERECTOMY    . BIOPSY THYROID  Jan. 2016  . LAPAROSCOPY N/A 08/25/2015   Procedure: EXPLORATORY LAPAROTOMY,  ILEOCECECTOMY, PRIMARY ANASTOMOSIS, LYSIS OF ADHESIONS;  Surgeon: Tiffany Messing III, MD;  Location: [facility] ORS;  Service: General;  Laterality: N/A;  . laparoscopy for fertility work up    . LOOP RECORDER IMPLANT N/A 03/24/2014   Procedure: LOOP RECORDER IMPLANT;  Surgeon: Tiffany Sprang, MD;  [facility] CATH LAB;  Service: Cardiovascular;  Laterality: N/A;  . LUMBAR LAMINECTOMY/DECOMPRESSION MICRODISCECTOMY  04/25/2011   Procedure: LUMBAR LAMINECTOMY/DECOMPRESSION MICRODISCECTOMY;  Surgeon: Tiffany Stakes, MD;  Location: [place] NEURO ORS;  Service: Neurosurgery;  Laterality: Right;  Right Lumbar Four-Five Discectomy  . OOPHORECTOMY    . TONSILLECTOMY     Family History  Problem Relation Age of Onset  . Cancer Sister        lung  . Diabetes Sister   . Cancer Brother        lung  . Colon cancer Paternal Uncle    Current Outpatient Prescriptions  Medication Sig Dispense Refill  . acetaminophen (TYLENOL) 500 MG tablet Take 1,000 mg by mouth 2 (two) times daily.    Marland Kitchen amLODipine (NORVASC) 10 MG tablet TAKE ONE TABLET BY MOUTH ONCE DAILY 90 tablet 1  . aspirin EC 81 MG tablet Take 81 mg by mouth daily.    . Coenzyme Q10 (COQ10) 100 MG CAPS Take 1 tablet by mouth daily.    Marland Kitchen lisinopril (PRINIVIL,ZESTRIL) 40 MG tablet TAKE ONE TABLET BY MOUTH ONCE DAILY 90  tablet 0  . metFORMIN (GLUCOPHAGE) 500 MG tablet Take 1 tablet (500 mg total) by mouth at bedtime. 90 tablet 1  . MULTIPLE VITAMIN PO Take 1 tablet by mouth daily.     . Multiple Vitamins-Minerals (VISION-VITE PRESERVE PO) Take 1 tablet by mouth 2 (two) times daily.    Marland Kitchen omeprazole (PRILOSEC) 20 MG capsule TAKE 1 CAPSULE BY MOUTH ONCE DAILY 30 capsule 0  . pravastatin (PRAVACHOL) 40 MG tablet Take 1 tablet (40 mg total) by mouth daily. 90 tablet 0  . Vitamin D, Cholecalciferol, 1000 units TABS Take 1 tablet by mouth daily.     Allergies  Allergen Reactions  . Codeine Nausea Only  . Morphine And Related Itching    Itching    Social History    Social History  . Marital status: Married   Social History Main Topics  . Smoking status: Former Smoker    Types: Cigarettes    Quit date: 04/02/2004  . Smokeless tobacco: Never Used  . Alcohol use 1.2 oz/week    2 Glasses of wine per week  . Drug use: No   Objective: VITAL SIGNS: BP (!) 120/50 (BP Location: Left Arm, Patient Position: Sitting, Cuff Size: Large)   Pulse (!) 57   Temp 98 F (36.7 C) (Oral)   Ht _0  (1.676 m)   Wt 183 lb (83 kg)   SpO2 98%   BMI 29.54 kg/m  Constitutional: Well formed, well developed. No acute distress. Cardiovascular: Brisk cap refill Thorax & Lungs: No accessory muscle use Extremities: No clubbing. No cyanosis. No edema.  HEENT: Ears neg b/l Musculoskeletal: Neck.   Normal active range of motion: yes.   Normal passive range of motion: yes Tenderness to palpation: yes over paraspinal musculature around C7/T1, worse on L Deformity: no Ecchymosis: no  Tests negative: Spurling's Mid back- no deformity, slouching posture with apex of kyphosis at T10 where pain is located, no significant TTP Poor flexion ROM of hamstrings, worse on R Neg Straight leg and Lesegue's Neurologic: Decreased sensation to pinprick over plantar surface of b/l feet. Pressure sensation intact b/l. No focal deficits noted. DTR's equal and symmetry in LE's. No clonus. Psychiatric: Normal mood. Age appropriate judgment and insight. Alert & oriented x 3.    Assessment:  Numbness of feet - Plan: TSH, Hemoglobin A1c, B12 and Folate Panel, Comp Met (CMET), Magnesium  Anemia, unspecified type - Plan: Erythropoietin, B12 and Folate Panel  Ear pain, left  Environmental allergies - Plan: fluticasone (FLONASE) 50 MCG/ACT nasal spray  Neck pain  Chronic bilateral thoracic back pain  Plan: Orders as above. My greatest worry is for her numbness. Orders as above, I am worried she has had untreated DM. Home stretches and exercises, heat, mind her posture. Let us know if  things bother her enough for PT.  F/u on anemia with EPO. Ear pain unclear etiology, will rec INCS, take watch off after use.  F/u in 2 weeks for visit for hip pain. The patient voiced understanding and agreement to the plan.   Rice, DO 09/07/16  5:20 PM

## 2016-09-07 NOTE — Patient Instructions (Signed)
Give [place] 2-3 business days to get the results of your labs back.   Practice good posture. If you slouch forward frequently, this can affect your back.   Heat (pad or rice pillow in microwave) over affected area, 10-15 minutes every 2-3 hours while awake.   EXERCISES RANGE OF MOTION (ROM) AND STRETCHING EXERCISES  These exercises may help you when beginning to rehabilitate your issue. In order to successfully resolve your symptoms, you must improve your posture. These exercises are designed to help reduce the forward-head and rounded-shoulder posture which contributes to this condition. Your symptoms may resolve with or without further involvement from your physician, physical therapist or athletic trainer. While completing these exercises, remember:   Restoring tissue flexibility helps normal motion to return to the joints. This allows healthier, less painful movement and activity.  An effective stretch should be held for at least 20 seconds, although you may need to begin with shorter hold times for comfort.  A stretch should never be painful. You should only feel a gentle lengthening or release in the stretched tissue.  Do not do any stretch or exercise that you cannot tolerate.  STRETCH- Axial Extensors  Lie on your back on the floor. You may bend your knees for comfort. Place a rolled-up hand towel or dish towel, about 2 inches in diameter, under the part of your head that makes contact with the floor.  Gently tuck your chin, as if trying to make a "double chin," until you feel a gentle stretch at the base of your head.  Hold 15-20 seconds. Repeat 2-3 times. Complete this exercise 1 time per day.   STRETCH - Axial Extension   Stand or sit on a firm surface. Assume a good posture: chest up, shoulders drawn back, abdominal muscles slightly tense, knees unlocked (if standing) and feet hip width apart.  Slowly retract your chin so your head slides back and your chin slightly lowers.  Continue to look straight ahead.  You should feel a gentle stretch in the back of your head. Be certain not to feel an aggressive stretch since this can cause headaches later.  Hold for 15-20 seconds. Repeat 2-3 times. Complete this exercise 1 time per day.  STRETCH - Cervical Side Bend   Stand or sit on a firm surface. Assume a good posture: chest up, shoulders drawn back, abdominal muscles slightly tense, knees unlocked (if standing) and feet hip width apart.  Without letting your nose or shoulders move, slowly tip your right / left ear to your shoulder until your feel a gentle stretch in the muscles on the opposite side of your neck.  Hold 15-20 seconds. Repeat 2-3 times. Complete this exercise 1-2 times per day.  STRETCH - Cervical Rotators   Stand or sit on a firm surface. Assume a good posture: chest up, shoulders drawn back, abdominal muscles slightly tense, knees unlocked (if standing) and feet hip width apart.  Keeping your eyes level with the ground, slowly turn your head until you feel a gentle stretch along the back and opposite side of your neck.  Hold 15-20 seconds. Repeat 2-3 times. Complete this exercise 1-2 times per day.  RANGE OF MOTION - Neck Circles   Stand or sit on a firm surface. Assume a good posture: chest up, shoulders drawn back, abdominal muscles slightly tense, knees unlocked (if standing) and feet hip width apart.  Gently roll your head down and around from the back of one shoulder to the back of the other.  The motion should never be forced or painful.  Repeat the motion 10-20 times, or until you feel the neck muscles relax and loosen. Repeat 2-3 times. Complete the exercise 1-2 times per day. STRENGTHENING EXERCISES - Cervical Strain and Sprain These exercises may help you when beginning to rehabilitate your injury. They may resolve your symptoms with or without further involvement from your physician, physical therapist, or athletic trainer. While  completing these exercises, remember:   Muscles can gain both the endurance and the strength needed for everyday activities through controlled exercises.  Complete these exercises as instructed by your physician, physical therapist, or athletic trainer. Progress the resistance and repetitions only as guided.  You may experience muscle soreness or fatigue, but the pain or discomfort you are trying to eliminate should never worsen during these exercises. If this pain does worsen, stop and make certain you are following the directions exactly. If the pain is still present after adjustments, discontinue the exercise until you can discuss the trouble with your clinician.  STRENGTH - Cervical Flexors, Isometric  Face a wall, standing about 6 inches away. Place a small pillow, a ball about 6-8 inches in diameter, or a folded towel between your forehead and the wall.  Slightly tuck your chin and gently push your forehead into the soft object. Push only with mild to moderate intensity, building up tension gradually. Keep your jaw and forehead relaxed.  Hold 10 to 20 seconds. Keep your breathing relaxed.  Release the tension slowly. Relax your neck muscles completely before you start the next repetition. Repeat 2-3 times. Complete this exercise 1 time per day.  STRENGTH- Cervical Lateral Flexors, Isometric   Stand about 6 inches away from a wall. Place a small pillow, a ball about 6-8 inches in diameter, or a folded towel between the side of your head and the wall.  Slightly tuck your chin and gently tilt your head into the soft object. Push only with mild to moderate intensity, building up tension gradually. Keep your jaw and forehead relaxed.  Hold 10 to 20 seconds. Keep your breathing relaxed.  Release the tension slowly. Relax your neck muscles completely before you start the next repetition. Repeat 2-3 times. Complete this exercise 1 time per day.  STRENGTH - Cervical Extensors, Isometric     Stand about 6 inches away from a wall. Place a small pillow, a ball about 6-8 inches in diameter, or a folded towel between the back of your head and the wall.  Slightly tuck your chin and gently tilt your head back into the soft object. Push only with mild to moderate intensity, building up tension gradually. Keep your jaw and forehead relaxed.  Hold 10 to 20 seconds. Keep your breathing relaxed.  Release the tension slowly. Relax your neck muscles completely before you start the next repetition. Repeat 2-3 times. Complete this exercise 1 time per day.  POSTURE AND BODY MECHANICS CONSIDERATIONS Keeping correct posture when sitting, standing or completing your activities will reduce the stress put on different body tissues, allowing injured tissues a chance to heal and limiting painful experiences. The following are general guidelines for improved posture. Your physician or physical therapist will provide you with any instructions specific to your needs. While reading these guidelines, remember:  The exercises prescribed by your provider will help you have the flexibility and strength to maintain correct postures.  The correct posture provides the optimal environment for your joints to work. All of your joints have less wear and tear  when properly supported by a spine with good posture. This means you will experience a healthier, less painful body.  Correct posture must be practiced with all of your activities, especially prolonged sitting and standing. Correct posture is as important when doing repetitive low-stress activities (typing) as it is when doing a single heavy-load activity (lifting).  PROLONGED STANDING WHILE SLIGHTLY LEANING FORWARD When completing a task that requires you to lean forward while standing in one place for a long time, place either foot up on a stationary 2- to 4-inch high object to help maintain the best posture. When both feet are on the ground, the low back tends  to lose its slight inward curve. If this curve flattens (or becomes too large), then the back and your other joints will experience too much stress, fatigue more quickly, and can cause pain.   RESTING POSITIONS Consider which positions are most painful for you when choosing a resting position. If you have pain with flexion-based activities (sitting, bending, stooping, squatting), choose a position that allows you to rest in a less flexed posture. You would want to avoid curling into a fetal position on your side. If your pain worsens with extension-based activities (prolonged standing, working overhead), avoid resting in an extended position such as sleeping on your stomach. Most people will find more comfort when they rest with their spine in a more neutral position, neither too rounded nor too arched. Lying on a non-sagging bed on your side with a pillow between your knees, or on your back with a pillow under your knees will often provide some relief. Keep in mind, being in any one position for a prolonged period of time, no matter how correct your posture, can still lead to stiffness.  WALKING Walk with an upright posture. Your ears, shoulders, and hips should all line up. OFFICE WORK When working at a desk, create an environment that supports good, upright posture. Without extra support, muscles fatigue and lead to excessive strain on joints and other tissues.  CHAIR:  A chair should be able to slide under your desk when your back makes contact with the back of the chair. This allows you to work closely.  The chair's height should allow your eyes to be level with the upper part of your monitor and your hands to be slightly lower than your elbows.  Body position: ? Your feet should make contact with the floor. If this is not possible, use a foot rest. ? Keep your ears over your shoulders. This will reduce stress on your neck and low back.

## 2016-09-08 LAB — MAGNESIUM: MAGNESIUM: 2 mg/dL (ref 1.5–2.5)

## 2016-09-08 LAB — COMPREHENSIVE METABOLIC PANEL
ALT: 11 U/L (ref 6–29)
AST: 13 U/L (ref 10–35)
Albumin: 3.9 g/dL (ref 3.6–5.1)
Alkaline Phosphatase: 60 U/L (ref 33–130)
BILIRUBIN TOTAL: 0.4 mg/dL (ref 0.2–1.2)
BUN: 24 mg/dL (ref 7–25)
CALCIUM: 9.5 mg/dL (ref 8.6–10.4)
CO2: 24 mmol/L (ref 20–31)
Chloride: 108 mmol/L (ref 98–110)
Creat: 1.24 mg/dL — ABNORMAL HIGH (ref 0.50–0.99)
Glucose, Bld: 188 mg/dL — ABNORMAL HIGH (ref 65–99)
POTASSIUM: 5 mmol/L (ref 3.5–5.3)
Sodium: 143 mmol/L (ref 135–146)
Total Protein: 6.1 g/dL (ref 6.1–8.1)

## 2016-09-08 LAB — B12 AND FOLATE PANEL
Folate: 21.3 ng/mL (ref >5.4)
Vitamin B-12: 399 pg/mL (ref 200–1100)

## 2016-09-08 LAB — HEMOGLOBIN A1C
HEMOGLOBIN A1C: 6 % — AB (ref <5.7)
MEAN PLASMA GLUCOSE: 126 mg/dL

## 2016-09-10 LAB — ERYTHROPOIETIN: Erythropoietin: 11.2 m[IU]/mL (ref 2.6–18.5)

## 2016-09-11 ENCOUNTER — Ambulatory Visit (INDEPENDENT_AMBULATORY_CARE_PROVIDER_SITE_OTHER): Payer: Medicare Other | Admitting: *Deleted

## 2016-09-11 ENCOUNTER — Encounter: Payer: Self-pay | Admitting: Family Medicine

## 2016-09-11 DIAGNOSIS — I639 Cerebral infarction, unspecified: Secondary | ICD-10-CM | POA: Diagnosis not present

## 2016-09-11 NOTE — Progress Notes (Signed)
Carelink Summary Report / Loop Recorder 

## 2016-09-13 ENCOUNTER — Telehealth: Payer: Self-pay | Admitting: Family Medicine

## 2016-09-13 NOTE — Telephone Encounter (Signed)
Caller name: [name] Relationship to patient: Spouse Can be reached: [phone]  Pharmacy:  [facility], [place] [phone] (Phone) [phone] (Fax)     Reason for call: Refill lisinopril (PRINIVIL,ZESTRIL) 40 MG tablet

## 2016-09-14 ENCOUNTER — Other Ambulatory Visit: Payer: Self-pay | Admitting: *Deleted

## 2016-09-14 MED ORDER — AMLODIPINE BESYLATE 10 MG PO TABS: 10.0000 mg | ORAL_TABLET | Freq: Every day | ORAL | 1 refills | Status: DC

## 2016-09-14 MED ORDER — LISINOPRIL 40 MG PO TABS
40.0000 mg | ORAL_TABLET | Freq: Every day | ORAL | 1 refills | Status: DC
Start: 2016-09-14 — End: 2017-03-20

## 2016-09-14 MED ORDER — OMEPRAZOLE 20 MG PO CPDR: 20.0000 mg | DELAYED_RELEASE_CAPSULE | Freq: Every day | ORAL | 1 refills | Status: DC

## 2016-09-14 NOTE — Telephone Encounter (Signed)
Rx approved and sent to the pharmacy.//AB/CMA 

## 2016-09-14 NOTE — Telephone Encounter (Signed)
Rx's have been approved and sent to the pharmacy by e-script.//AB/CMA

## 2016-09-23 LAB — CUP PACEART REMOTE DEVICE CHECK
Date Time Interrogation Session: 20180612073912
MDC IDC PG IMPLANT DT: 20151223

## 2016-09-23 NOTE — Progress Notes (Signed)
Carelink summary report received. Battery status OK. Normal device function. No new symptom episodes, tachy episodes, brady, or pause episodes. No new AF episodes. Monthly summary reports and ROV/PRN 

## 2016-10-11 ENCOUNTER — Ambulatory Visit (INDEPENDENT_AMBULATORY_CARE_PROVIDER_SITE_OTHER): Payer: Medicare Other | Admitting: *Deleted

## 2016-10-11 DIAGNOSIS — I639 Cerebral infarction, unspecified: Secondary | ICD-10-CM

## 2016-10-11 NOTE — Progress Notes (Signed)
Carelink Summary Report / Loop Recorder 

## 2016-10-18 LAB — CUP PACEART REMOTE DEVICE CHECK
Date Time Interrogation Session: 20180712162844
MDC IDC PG IMPLANT DT: 20151223

## 2016-11-12 ENCOUNTER — Ambulatory Visit (INDEPENDENT_AMBULATORY_CARE_PROVIDER_SITE_OTHER): Payer: Medicare Other | Admitting: *Deleted

## 2016-11-12 DIAGNOSIS — R002 Palpitations: Secondary | ICD-10-CM | POA: Diagnosis not present

## 2016-11-17 LAB — CUP PACEART REMOTE DEVICE CHECK
Date Time Interrogation Session: 20180811164358
Implantable Pulse Generator Implant Date: 20151223

## 2016-11-17 NOTE — Progress Notes (Signed)
Carelink summary report received. Battery status OK. Normal device function. No new symptom episodes, tachy episodes, brady, or pause episodes. No new AF episodes. Monthly summary reports and ROV/PRN 

## 2016-12-10 ENCOUNTER — Ambulatory Visit (INDEPENDENT_AMBULATORY_CARE_PROVIDER_SITE_OTHER): Payer: Medicare Other | Admitting: *Deleted

## 2016-12-10 DIAGNOSIS — I639 Cerebral infarction, unspecified: Secondary | ICD-10-CM | POA: Diagnosis not present

## 2016-12-11 NOTE — Progress Notes (Signed)
Carelink Summary Report / Loop Recorder 

## 2016-12-12 LAB — CUP PACEART REMOTE DEVICE CHECK
Implantable Pulse Generator Implant Date: 20151223
MDC IDC SESS DTM: 20180910203838

## 2016-12-17 ENCOUNTER — Ambulatory Visit: Payer: Medicare Other | Admitting: Family Medicine

## 2016-12-20 ENCOUNTER — Other Ambulatory Visit: Payer: Self-pay | Admitting: Family Medicine

## 2016-12-20 ENCOUNTER — Ambulatory Visit (INDEPENDENT_AMBULATORY_CARE_PROVIDER_SITE_OTHER): Payer: Medicare Other | Admitting: Family Medicine

## 2016-12-20 ENCOUNTER — Ambulatory Visit (HOSPITAL_BASED_OUTPATIENT_CLINIC_OR_DEPARTMENT_OTHER)
Admission: RE | Admit: 2016-12-20 | Discharge: 2016-12-20 | Disposition: A | Discharge status: Home / Self Care | Payer: Medicare Other | Source: Ambulatory Visit | Attending: Family Medicine | Admitting: Family Medicine

## 2016-12-20 ENCOUNTER — Encounter: Payer: Self-pay | Admitting: Family Medicine

## 2016-12-20 VITALS — BP 120/64 | HR 54 | Temp 98.1°F | Ht 66.0 in | Wt 182.1 lb

## 2016-12-20 DIAGNOSIS — M791 Myalgia, unspecified site: Secondary | ICD-10-CM

## 2016-12-20 DIAGNOSIS — R413 Other amnesia: Secondary | ICD-10-CM

## 2016-12-20 DIAGNOSIS — M25552 Pain in left hip: Secondary | ICD-10-CM

## 2016-12-20 DIAGNOSIS — T466X5A Adverse effect of antihyperlipidemic and antiarteriosclerotic drugs, initial encounter: Secondary | ICD-10-CM

## 2016-12-20 NOTE — Progress Notes (Signed)
Pre visit review using our clinic review tool, if applicable. No additional management support is needed unless otherwise documented below in the visit note. 

## 2016-12-20 NOTE — Patient Instructions (Addendum)
Heat (pad or rice pillow in microwave) over affected area, 10-15 minutes every 2-3 hours while awake.   OK to take Tylenol 1000 mg (2 extra strength tabs) or 975 mg (3 regular strength tabs) every 6 hours as needed.   Hip Exercises Ask your health care provider which exercises are safe for you. Do exercises exactly as told by your health care provider and adjust them as directed. It is normal to feel mild stretching, pulling, tightness, or discomfort as you do these exercises, but you should stop right away if you feel sudden pain or your pain gets worse.Do not begin these exercises until told by your health care provider. STRETCHING AND RANGE OF MOTION EXERCISES These exercises warm up your muscles and joints and improve the movement and flexibility of your hip. These exercises also help to relieve pain, numbness, and tingling. Exercise A: Hamstrings, Supine  1. Lie on your back. 2. Loop a belt or towel over the ball of your left / rightfoot. The ball of your foot is on the walking surface, right under your toes. 3. Straighten your left / rightknee and slowly pull on the belt to raise your leg. ? Do not let your left / right knee bend while you do this. ? Keep your other leg flat on the floor. ? Raise the left / right leg until you feel a gentle stretch behind your left / right knee or thigh. 4. Hold this position for __________ seconds. 5. Slowly return your leg to the starting position. Repeat __________ times. Complete this stretch __________ times a day. Exercise B: Hip Rotators  1. Lie on your back on a firm surface. 2. Hold your left / right knee with your left / right hand. Hold your ankle with your other hand. 3. Gently pull your left / right knee and rotate your lower leg toward your other shoulder. ? Pull until you feel a stretch in your buttocks. ? Keep your hips and shoulders firmly planted while you do this stretch. 4. Hold this position for __________ seconds. Repeat  __________ times. Complete this stretch __________ times a day. Exercise C: V-Sit (Hamstrings and Adductors)  1. Sit on the floor with your legs extended in a large "V" shape. Keep your knees straight during this exercise. 2. Start with your head and chest upright, then bend at your waist to reach for your left foot (position A). You should feel a stretch in your right inner thigh. 3. Hold this position for __________ seconds. Then slowly return to the upright position. 4. Bend at your waist to reach forward (position B). You should feel a stretch behind both of your thighs and knees. 5. Hold this position for __________ seconds. Then slowly return to the upright position. 6. Bend at your waist to reach for your right foot (position C). You should feel a stretch in your left inner thigh. 7. Hold this position for __________ seconds. Then slowly return to the upright position. Repeat __________ times. Complete this stretch __________ times a day. Exercise D: Lunge (Hip Flexors)  1. Place your left / right knee on the floor and bend your other knee so that is directly over your ankle. You should be half-kneeling. 2. Keep good posture with your head over your shoulders. 3. Tighten your buttocks to point your tailbone downward. This helps your back to keep from arching too much. 4. You should feel a gentle stretch in the front of your left / right thigh and hip. If you  do not feel any resistance, slightly slide your other foot forward and then slowly lunge forward so your knee once again lines up over your ankle. 5. Make sure your tailbone continues to point downward. 6. Hold this position for __________ seconds. Repeat __________ times. Complete this stretch __________ times a day. STRENGTHENING EXERCISES These exercises build strength and endurance in your hip. Endurance is the ability to use your muscles for a long time, even after they get tired. Exercise E: Bridge (Hip Extensors)  1. Lie on  your back on a firm surface with your knees bent and your feet flat on the floor. 2. Tighten your buttocks muscles and lift your bottom off the floor until the trunk of your body is level with your thighs. ? Do not arch your back. ? You should feel the muscles working in your buttocks and the back of your thighs. If you do not feel these muscles, slide your feet 1-2 inches (2.5-5 cm) farther away from your buttocks. 3. Hold this position for __________ seconds. 4. Slowly lower your hips to the starting position. 5. Let your muscles relax completely between repetitions. 6. If this exercise is too easy, try doing it with your arms crossed over your chest. Repeat __________ times. Complete this exercise __________ times a day. Exercise F: Straight Leg Raises - Hip Abductors  1. Lie on your side with your left / right leg in the top position. Lie so your head, shoulder, knee, and hip line up with each other. You may bend your bottom knee to help you balance. 2. Roll your hips slightly forward, so your hips are stacked directly over each other and your left / right knee is facing forward. 3. Leading with your heel, lift your top leg 4-6 inches (10-15 cm). You should feel the muscles in your outer hip lifting. ? Do not let your foot drift forward. ? Do not let your knee roll toward the ceiling. 4. Hold this position for __________ seconds. 5. Slowly return to the starting position. 6. Let your muscles relax completely between repetitions. Repeat __________ times. Complete this exercise __________ times a day. Exercise G: Straight Leg Raises - Hip Adductors  1. Lie on your side with your left / right leg in the bottom position. Lie so your head, shoulder, knee, and hip line up. You may place your upper foot in front to help you balance. 2. Roll your hips slightly forward, so your hips are stacked directly over each other and your left / right knee is facing forward. 3. Tense the muscles in your  inner thigh and lift your bottom leg 4-6 inches (10-15 cm). 4. Hold this position for __________ seconds. 5. Slowly return to the starting position. 6. Let your muscles relax completely between repetitions. Repeat __________ times. Complete this exercise __________ times a day. Exercise H: Straight Leg Raises - Quadriceps  1. Lie on your back with your left / right leg extended and your other knee bent. 2. Tense the muscles in the front of your left / right thigh. When you do this, you should see your kneecap slide up or see increased dimpling just above your knee. 3. Tighten these muscles even more and raise your leg 4-6 inches (10-15 cm) off the floor. 4. Hold this position for __________ seconds. 5. Keep these muscles tense as you lower your leg. 6. Relax the muscles slowly and completely between repetitions. Repeat __________ times. Complete this exercise __________ times a day. Exercise I: Hip Abductors,  Standing 1. Tie one end of a rubber exercise band or tubing to a secure surface, such as a table or pole. 2. Loop the other end of the band or tubing around your left / right ankle. 3. Keeping your ankle with the band or tubing directly opposite of the secured end, step away until there is tension in the tubing or band. Hold onto a chair as needed for balance. 4. Lift your left / right leg out to your side. While you do this: ? Keep your back upright. ? Keep your shoulders over your hips. ? Keep your toes pointing forward. ? Make sure to use your hip muscles to lift your leg. Do not "throw" your leg or tip your body to lift your leg. 5. Hold this position for __________ seconds. 6. Slowly return to the starting position. Repeat __________ times. Complete this exercise __________ times a day. Exercise J: Squats (Quadriceps) 1. Stand in a door frame so your feet and knees are in line with the frame. You may place your hands on the frame for balance. 2. Slowly bend your knees and lower  your hips like you are going to sit in a chair. ? Keep your lower legs in a straight-up-and-down position. ? Do not let your hips go lower than your knees. ? Do not bend your knees lower than told by your health care provider. ? If your hip pain increases, do not bend as low. 3. Hold this position for ___________ seconds. 4. Slowly push with your legs to return to standing. Do not use your hands to pull yourself to standing. Repeat __________ times. Complete this exercise __________ times a day. This information is not intended to replace advice given to you by your health care provider. Make sure you discuss any questions you have with your health care provider. Document Released: 04/06/2005 Document Revised: 12/12/2015 Document Reviewed: 03/14/2015 Elsevier Interactive Patient Education  [employer]. I will let you know about your XR results with the plan.  Go back on Pravachol. Let me know if you start having aches again. Stay well hydrated with water.

## 2016-12-20 NOTE — Progress Notes (Signed)
Musculoskeletal Exam  Patient: Tiffany Wiggins DOB: Nov 25, 1945  DOS: 12/20/2016  SUBJECTIVE:  Chief Complaint:   Chief Complaint  Patient presents with  . Follow-up    hip pain    Tiffany Wiggins is a 71 y.o.  female for evaluation and treatment of L hip pain.   Onset: many months, no injury or change in activity Location: L outer hip and over L SI joint  She had an x-ray of left hip done 2016 that showed mild degenerative changes. Character:  sharp  Progression of issue:  has worsened slightly Associated symptoms: none Worse with certain movements or positions Treatment: to date has been rest.   Neurovascular symptoms: no  She also brought a progressive poor memory. She has difficulty with remembering and word finding. She is still able to function normally and it does not affect her activities of daily living. She does have a history of CVA.  Lastly, she mentioned some cramping she is having her thighs. In the past, she had intolerance to statin medication. She stopped her Pravachol and noted that the muscle aches have gone away. She has been off of the statin for around 3 weeks. She is wondering what to do next. Her oral intake of water is not good.  ROS: Musculoskeletal/Extremities: +L hip pain Neurologic: no numbness, tingling no weakness   Past Medical History:  Diagnosis Date  . Diabetes mellitus type 2 in nonobese (HCC)   . Hyperlipidemia   . Hypertension    PCP GAVE FOR HER  BLOOD PRESSURE  . IBS (irritable bowel syndrome)   . PUD (peptic ulcer disease)   . Stroke [facility])    TIA  BACK IN  04/2004   Past Surgical History:  Procedure Laterality Date  . ABDOMINAL HYSTERECTOMY    . BIOPSY THYROID  Jan. 2016  . LAPAROSCOPY N/A 08/25/2015   Procedure: EXPLORATORY LAPAROTOMY, ILEOCECECTOMY, PRIMARY ANASTOMOSIS, LYSIS OF ADHESIONS;  Surgeon: Autumn Messing III, MD;  Location: [facility] ORS;  Service: General;  Laterality: N/A;  . laparoscopy for fertility work up    . LOOP RECORDER  IMPLANT N/A 03/24/2014   Procedure: LOOP RECORDER IMPLANT;  Surgeon: Deboraha Sprang, MD;  Location: [facility] CATH LAB;  Service: Cardiovascular;  Laterality: N/A;  . LUMBAR LAMINECTOMY/DECOMPRESSION MICRODISCECTOMY  04/25/2011   Procedure: LUMBAR LAMINECTOMY/DECOMPRESSION MICRODISCECTOMY;  Surgeon: [name], MD;  Location: [place] NEURO ORS;  Service: Neurosurgery;  Laterality: Right;  Right Lumbar Four-Five Discectomy  . OOPHORECTOMY    . TONSILLECTOMY     Family History  Problem Relation Age of Onset  . Cancer Sister        lung  . Diabetes Sister   . Cancer Brother        lung  . Colon cancer Paternal Uncle    Current Outpatient Prescriptions  Medication Sig Dispense Refill  . acetaminophen (TYLENOL) 500 MG tablet Take 1,000 mg by mouth 2 (two) times daily.    Marland Kitchen amLODipine (NORVASC) 10 MG tablet Take 1 tablet (10 mg total) by mouth daily. 90 tablet 1  . aspirin EC 81 MG tablet Take 81 mg by mouth daily.    . Coenzyme Q10 (COQ10) 100 MG CAPS Take 1 tablet by mouth daily.    . Ferrous Gluconate (IRON 27 PO) Take 2 mg by mouth 2 (two) times daily.    . fluticasone (FLONASE) 50 MCG/ACT nasal spray Place 2 sprays into both nostrils daily. 16 g 6  . lisinopril (PRINIVIL,ZESTRIL) 40 MG tablet Take 1  tablet (40 mg total) by mouth daily. 90 tablet 1  . metFORMIN (GLUCOPHAGE) 500 MG tablet Take 1 tablet (500 mg total) by mouth at bedtime. 90 tablet 1  . MULTIPLE VITAMIN PO Take 1 tablet by mouth daily.     . Multiple Vitamins-Minerals (VISION-VITE PRESERVE PO) Take 1 tablet by mouth 2 (two) times daily.    Marland Kitchen omeprazole (PRILOSEC) 20 MG capsule Take 1 capsule (20 mg total) by mouth daily. 90 capsule 1  . pravastatin (PRAVACHOL) 40 MG tablet Take 1 tablet (40 mg total) by mouth daily. 90 tablet 0  . Vitamin D, Cholecalciferol, 1000 units TABS Take 1 tablet by mouth daily.      Allergies  Allergen Reactions  . Codeine Nausea Only  . Morphine And Related Itching    Itching    Social History    Social History  . Marital status: Married   Social History Main Topics  . Smoking status: Former Smoker    Types: Cigarettes    Quit date: 04/02/2004  . Smokeless tobacco: Never Used  . Alcohol use 1.2 oz/week    2 Glasses of wine per week  . Drug use: No   Objective: VITAL SIGNS: BP 120/64 (BP Location: Left Arm, Patient Position: Sitting, Cuff Size: Normal)   Pulse (!) 54   Temp 98.1 F (36.7 C) (Oral)   Ht 5\' 6"  (1.676 m)   Wt 182 lb 2 oz (82.6 kg)   SpO2 97%   BMI 29.40 kg/m  Constitutional: Well formed, well developed. No acute distress. Cardiovascular: Brisk cap refill Thorax & Lungs: No accessory muscle use Extremities: No clubbing. No cyanosis. Skin: Warm. Dry. No erythema. No rash.  Musculoskeletal: L hip.   Normal active range of motion: yes.   Normal passive range of motion: yes Tenderness to palpation: yes; over lateral hip flexors and L glute Deformity: no Ecchymosis: no Tests positive: Stinchfield Tests negative: Ober's, Log roll, FABBER, FADDIR +TTP with resisted L hip abduction 5/5 strength throughout LE's b/l Neurologic: Normal sensory function. No focal deficits noted. Psychiatric: Normal mood. Age appropriate judgment and insight. Alert & oriented x 3.    Assessment:  Left hip pain  Memory loss  Plan: XR hip. Home stretches/exercises given. Heat. Tylenol. Let me know in several weeks if no improvement or worsening and we will refer to PT. Discussed watchful waiting with a memory loss versus referring for a neuropsychological evaluation. She opted for the former.  Stay well hydrated. Go back on statin. Let me know if the muscle aches return. F/u in 1 mo for DM visit.  The patient voiced understanding and agreement to the plan.   [place], DO 12/20/16  2:31 PM

## 2016-12-21 ENCOUNTER — Encounter: Payer: Self-pay | Admitting: Family Medicine

## 2016-12-21 ENCOUNTER — Other Ambulatory Visit: Payer: Self-pay | Admitting: Family Medicine

## 2016-12-21 DIAGNOSIS — M1612 Unilateral primary osteoarthritis, left hip: Secondary | ICD-10-CM

## 2016-12-26 ENCOUNTER — Ambulatory Visit (INDEPENDENT_AMBULATORY_CARE_PROVIDER_SITE_OTHER): Payer: Medicare Other | Admitting: Family Medicine

## 2016-12-26 ENCOUNTER — Encounter: Payer: Self-pay | Admitting: Family Medicine

## 2016-12-26 VITALS — BP 122/82 | HR 98 | Temp 98.0°F | Ht 66.0 in | Wt 182.2 lb

## 2016-12-26 DIAGNOSIS — R200 Anesthesia of skin: Secondary | ICD-10-CM

## 2016-12-26 DIAGNOSIS — R202 Paresthesia of skin: Secondary | ICD-10-CM | POA: Diagnosis not present

## 2016-12-26 DIAGNOSIS — R0781 Pleurodynia: Secondary | ICD-10-CM | POA: Diagnosis not present

## 2016-12-26 NOTE — Progress Notes (Signed)
Pre visit review using our clinic review tool, if applicable. No additional management support is needed unless otherwise documented below in the visit note. 

## 2016-12-26 NOTE — Progress Notes (Signed)
Chief Complaint  Patient presents with  . Numbness    left hand  . Abdominal Pain    Tiffany Wiggins is here for LUQ abdominal pain.  Duration: 3 days  Lasts for around 1 second.  Nighttime awakenings? No Bleeding? No Weight loss? No Palliation: None Provocation: No association Associated symptoms: None Denies: fever, nausea and vomiting Treatment to date: None +hx of IBS/GERD, does not feel like either  L hand numbness Has been going on for the past 3 weeks. No injury or change in activity. Describes as pins and needles. Goes away once she moves hand. Usually bothers her at night. Lasts until she moves her hand.  No weakness or pain.  ROS: Constitutional: No fevers GI: No N/V/C, no bleeding + pain  Past Medical History:  Diagnosis Date  . Diabetes mellitus type 2 in nonobese (HCC)   . Hyperlipidemia   . Hypertension    PCP GAVE FOR HER  BLOOD PRESSURE  . IBS (irritable bowel syndrome)   . PUD (peptic ulcer disease)   . Stroke [facility])    TIA  BACK IN  04/2004   Family History  Problem Relation Age of Onset  . Cancer Sister        lung  . Diabetes Sister   . Cancer Brother        lung  . Colon cancer Paternal Uncle    Past Surgical History:  Procedure Laterality Date  . ABDOMINAL HYSTERECTOMY    . BIOPSY THYROID  Jan. 2016  . LAPAROSCOPY N/A 08/25/2015   Procedure: EXPLORATORY LAPAROTOMY, ILEOCECECTOMY, PRIMARY ANASTOMOSIS, LYSIS OF ADHESIONS;  Surgeon: Autumn Messing III, MD;  Location: [facility]S;  Service: General;  Laterality: N/A;  . laparoscopy for fertility work up    . LOOP RECORDER IMPLANT N/A 03/24/2014   Procedure: LOOP RECORDER IMPLANT;  Surgeon: Deboraha Sprang, MD;  Location: [facility] CATH LAB;  Service: Cardiovascular;  Laterality: N/A;  . LUMBAR LAMINECTOMY/DECOMPRESSION MICRODISCECTOMY  04/25/2011   Procedure: LUMBAR LAMINECTOMY/DECOMPRESSION MICRODISCECTOMY;  Surgeon: [name], MD;  Location: [place] NEURO ORS;  Service: Neurosurgery;  Laterality: Right;  Right  Lumbar Four-Five Discectomy  . OOPHORECTOMY    . TONSILLECTOMY      BP 122/82 (BP Location: Left Arm, Patient Position: Sitting, Cuff Size: Normal)   Pulse 98   Temp 98 F (36.7 C) (Oral)   Ht 5\' 6"  (1.676 m)   Wt 182 lb 4 oz (82.7 kg)   SpO2 98%   BMI 29.42 kg/m  Gen.: Awake, alert, appears stated age 71: Mucous membranes moist without mucosal lesions Heart: Regular rate and rhythm without murmurs Lungs: Clear auscultation bilaterally, no rales or wheezing, normal effort without accessory muscle use. Abdomen: Bowel sounds are present. Abdomen is soft, TTP in lower quadrants (chronic) and epigastric region, nondistended, no masses or organomegaly. Negative Murphy's, Rovsing's, McBurney's, and Carnett's sign. MSK: +TTP over L lower rib cage, no deformity; 5/5 grip strength Neuro: Sensation intact to pinprick b/l, equal, neg Tinel's, Phalen's Psych: Age appropriate judgment and insight. Normal mood and affect.  Rib pain on left side  Numbness and tingling in left hand  Orders as above. MSK in etiology, no worrisome s/s's for cardiac/pulm etiology.  Numbness in hand not particularly bothersome for patient at this time, she will let us know if she wishes to wear a nighttime brace and we can provider it for her. F/u as originally scheduled or prn. Pt voiced understanding and agreement to the plan.  Greater than  20 minutes were spent face to face with the patient with greater than 50% of this time spent counseling on chest pain/cardiac issues, GERD, msk rib pain/tx, numbness/tingling dx/tx/px.    [place], DO 12/26/16 1:34 PM

## 2016-12-26 NOTE — Patient Instructions (Addendum)
Stretch the area on your left rib cage.  Ice/cold pack over area for 10-15 min every 2-3 hours while awake. Heat (pad or rice pillow in microwave) over affected area, 10-15 minutes every 2-3 hours while awake.  OK to take Tylenol 1000 mg (2 extra strength tabs) or 975 mg (3 regular strength tabs) every 6 hours as needed.  Let us know if you change your mind about the wrist brace to wear at night.

## 2017-01-07 ENCOUNTER — Telehealth: Payer: Self-pay | Admitting: *Deleted

## 2017-01-07 ENCOUNTER — Ambulatory Visit (INDEPENDENT_AMBULATORY_CARE_PROVIDER_SITE_OTHER): Payer: Self-pay | Admitting: Orthopaedic Surgery

## 2017-01-07 NOTE — Telephone Encounter (Signed)
Received request for Medical Records from [place]; forwarded to [place] for email/scanSLS 10/08

## 2017-01-09 ENCOUNTER — Ambulatory Visit (INDEPENDENT_AMBULATORY_CARE_PROVIDER_SITE_OTHER): Payer: Medicare Other | Admitting: *Deleted

## 2017-01-09 DIAGNOSIS — I639 Cerebral infarction, unspecified: Secondary | ICD-10-CM | POA: Diagnosis not present

## 2017-01-10 NOTE — Progress Notes (Signed)
Carelink Summary Report / Loop Recorder 

## 2017-01-14 ENCOUNTER — Other Ambulatory Visit (INDEPENDENT_AMBULATORY_CARE_PROVIDER_SITE_OTHER): Payer: Self-pay

## 2017-01-14 ENCOUNTER — Ambulatory Visit (INDEPENDENT_AMBULATORY_CARE_PROVIDER_SITE_OTHER): Payer: Medicare Other | Admitting: Orthopaedic Surgery

## 2017-01-14 DIAGNOSIS — M25552 Pain in left hip: Secondary | ICD-10-CM | POA: Diagnosis not present

## 2017-01-14 DIAGNOSIS — M1612 Unilateral primary osteoarthritis, left hip: Secondary | ICD-10-CM | POA: Diagnosis not present

## 2017-01-14 NOTE — Progress Notes (Signed)
Office Visit Note   Patient: Tiffany Wiggins           Date of Birth: March 28, 1946           MRN: [id number] Visit Date: 01/14/2017              Requested by: Shelda Pal, [address] [place], [place] [address] PCP: Shelda Pal, DO   Assessment & Plan: Visit Diagnoses:  1. Pain of left hip joint   2. Unilateral primary osteoarthritis, left hip     Plan: We did talk about the possibility of hip replacement surgery. I do believe she has a degenerative labral tear on top of her arthritic changes and that's what the stabbing pain is coming from. I would like to send her for an intra-articular steroid injection by Dr. Ernestina Patches in her left hip to see how this helps with her pain and the like her to let me know over time how this affects her with activities. I did give her handout on hip replacement surgery and showed her hip model expander in detail what this involves. When she comes back to see [place] in 4 weeks I would like an AP and lateral of her lumbar spine at that visit.  Follow-Up Instructions: Return in about 4 weeks (around 02/11/2017).   Orders:  No orders of the defined types were placed in this encounter.  No orders of the defined types were placed in this encounter.     Procedures: No procedures performed   Clinical Data: No additional findings.   Subjective: No chief complaint on file. The patient is a 71 year old I'm seeing for the first time. This is to evaluate her left hip. She has x-rays from I reviewed on the canopy system. She has mainly pain in her groin on the left side but it occurs in increments were sometimes it doesn't hurt at all and other times she gets a sharp stabbing pain in the groin with certain activities. She denies a numbness and tingling. She does report neck pain and back pain with popping in her neck and low back. The stabbing pain over her groin is been starting to detrimental effect her mobility as well as  her quality of life and her pain can be significant at times. The cement going on for over a year now.  HPI  Review of Systems He currently denies any headache, chest pain, shortness of breath, fever, chills, nausea, vomiting. She does report neck pain and low back pain.  Objective: Vital Signs: There were no vitals taken for this visit.  Physical Exam She is alert or 3 and in no acute distress. She does not walk with an assistive device. Ortho Exam Examination of both hips shows fluid range of motion but the left hip has severe pain in the groin with extremes of internal and external rotation. Both hips move smoothly. Specialty Comments:  No specialty comments available.  Imaging: No results found. X-rays independently reviewed of both hips show moderate to severe arthritic changes. Although there is still joint space of the superior lateral aspect there is significant para-articular osteophytes on both hips.  PMFS History: Patient Active Problem List   Diagnosis Date Noted  . Pain of left hip joint 01/14/2017  . Unilateral primary osteoarthritis, left hip 01/14/2017  . Cough 02/15/2016  . Wheezing 02/15/2016  . Perforation of cecum s/p R hemicolectomy with anastamosis (08/25/2015) 08/26/2015  . Thoracic back pain 04/25/2015  .  Chronic venous insufficiency 12/21/2014  . Hip arthritis 08/10/2014  . Breast pain, right 07/22/2014  . Thyroid nodule 05/01/2014  . PALPITATIONS, CHRONIC 12/08/2009  . HIP PAIN, BILATERAL 09/15/2009  . OVERWEIGHT 03/08/2008  . Cerebral artery occlusion with cerebral infarction ([place]) 03/08/2008  . Hyperlipemia 01/30/2007  . Essential hypertension 01/30/2007  . Diabetes mellitus type 2, controlled, with complications ([place]) 40/01/2724   Past Medical History:  Diagnosis Date  . Diabetes mellitus type 2 in nonobese (HCC)   . Hyperlipidemia   . Hypertension    PCP GAVE FOR HER  BLOOD PRESSURE  . IBS (irritable bowel syndrome)   . PUD (peptic ulcer  disease)   . Stroke [facility])    TIA  BACK IN  04/2004    Family History  Problem Relation Age of Onset  . Cancer Sister        lung  . Diabetes Sister   . Cancer Brother        lung  . Colon cancer Paternal Uncle     Past Surgical History:  Procedure Laterality Date  . ABDOMINAL HYSTERECTOMY    . BIOPSY THYROID  Jan. 2016  . LAPAROSCOPY N/A 08/25/2015   Procedure: EXPLORATORY LAPAROTOMY, ILEOCECECTOMY, PRIMARY ANASTOMOSIS, LYSIS OF ADHESIONS;  Surgeon: Autumn Messing III, MD;  Location: [facility]S;  Service: General;  Laterality: N/A;  . laparoscopy for fertility work up    . LOOP RECORDER IMPLANT N/A 03/24/2014   Procedure: LOOP RECORDER IMPLANT;  Surgeon: Deboraha Sprang, MD;  Location: [facility] CATH LAB;  Service: Cardiovascular;  Laterality: N/A;  . LUMBAR LAMINECTOMY/DECOMPRESSION MICRODISCECTOMY  04/25/2011   Procedure: LUMBAR LAMINECTOMY/DECOMPRESSION MICRODISCECTOMY;  Surgeon: [name], MD;  Location: [facility] NEURO ORS;  Service: Neurosurgery;  Laterality: Right;  Right Lumbar Four-Five Discectomy  . OOPHORECTOMY    . TONSILLECTOMY     Social History   Occupational History  . Not on file.   Social History Main Topics  . Smoking status: Former Smoker    Types: Cigarettes    Quit date: 04/02/2004  . Smokeless tobacco: Never Used  . Alcohol use 1.2 oz/week    2 Glasses of wine per week  . Drug use: No  . Sexual activity: Not on file

## 2017-01-15 LAB — CUP PACEART REMOTE DEVICE CHECK
MDC IDC PG IMPLANT DT: 20151223
MDC IDC SESS DTM: 20181010214250

## 2017-01-24 DIAGNOSIS — Z8673 Personal history of transient ischemic attack (TIA), and cerebral infarction without residual deficits: Secondary | ICD-10-CM | POA: Insufficient documentation

## 2017-01-24 DIAGNOSIS — D638 Anemia in other chronic diseases classified elsewhere: Secondary | ICD-10-CM | POA: Insufficient documentation

## 2017-01-24 DIAGNOSIS — K219 Gastro-esophageal reflux disease without esophagitis: Secondary | ICD-10-CM | POA: Insufficient documentation

## 2017-01-24 DIAGNOSIS — R5381 Other malaise: Secondary | ICD-10-CM | POA: Insufficient documentation

## 2017-01-24 DIAGNOSIS — M5136 Other intervertebral disc degeneration, lumbar region: Secondary | ICD-10-CM | POA: Insufficient documentation

## 2017-01-24 DIAGNOSIS — M159 Polyosteoarthritis, unspecified: Secondary | ICD-10-CM | POA: Insufficient documentation

## 2017-01-24 DIAGNOSIS — Z79899 Other long term (current) drug therapy: Secondary | ICD-10-CM | POA: Insufficient documentation

## 2017-01-24 DIAGNOSIS — I6523 Occlusion and stenosis of bilateral carotid arteries: Secondary | ICD-10-CM | POA: Insufficient documentation

## 2017-01-24 DIAGNOSIS — E1122 Type 2 diabetes mellitus with diabetic chronic kidney disease: Secondary | ICD-10-CM | POA: Insufficient documentation

## 2017-01-24 DIAGNOSIS — R0989 Other specified symptoms and signs involving the circulatory and respiratory systems: Secondary | ICD-10-CM | POA: Insufficient documentation

## 2017-01-24 DIAGNOSIS — E559 Vitamin D deficiency, unspecified: Secondary | ICD-10-CM | POA: Insufficient documentation

## 2017-01-25 ENCOUNTER — Ambulatory Visit (INDEPENDENT_AMBULATORY_CARE_PROVIDER_SITE_OTHER): Payer: Medicare Other | Admitting: Physical Medicine and Rehabilitation

## 2017-01-30 ENCOUNTER — Telehealth: Payer: Self-pay | Admitting: *Deleted

## 2017-01-30 NOTE — Telephone Encounter (Signed)
Spoke with patient and patient's husband regarding Dr. Olin Pia recommendation to schedule appointment with [facility]. Appointment scheduled 02/05/17 at 11:30. Gave patient's husband directions, [facility] phone number, and Gar[facility] code.

## 2017-01-30 NOTE — Telephone Encounter (Signed)
Follow up     Patient returning call. Please call again.

## 2017-01-30 NOTE — Telephone Encounter (Signed)
LMOVM regarding New Afib noted on LINQ. Per Dr. Caryl Comes recommendations to schedule appointment with [facility] to discuss anticoagulation. [facility] phone number to call back.

## 2017-01-30 NOTE — Telephone Encounter (Signed)
Spoke with Tiffany Wiggins in Device and she states they will take care of this and schedule appt for this pt

## 2017-02-01 ENCOUNTER — Other Ambulatory Visit: Payer: Self-pay | Admitting: Internal Medicine

## 2017-02-05 ENCOUNTER — Ambulatory Visit (INDEPENDENT_AMBULATORY_CARE_PROVIDER_SITE_OTHER): Payer: Medicare Other | Admitting: Physical Medicine and Rehabilitation

## 2017-02-05 ENCOUNTER — Encounter (HOSPITAL_COMMUNITY): Payer: Self-pay | Admitting: Nurse Practitioner

## 2017-02-05 ENCOUNTER — Ambulatory Visit (HOSPITAL_COMMUNITY)
Admission: RE | Admit: 2017-02-05 | Discharge: 2017-02-05 | Disposition: A | Discharge status: Home / Self Care | Payer: Medicare Other | Source: Ambulatory Visit | Attending: Nurse Practitioner | Admitting: Nurse Practitioner

## 2017-02-05 ENCOUNTER — Encounter (INDEPENDENT_AMBULATORY_CARE_PROVIDER_SITE_OTHER): Payer: Self-pay | Admitting: Physical Medicine and Rehabilitation

## 2017-02-05 ENCOUNTER — Ambulatory Visit (INDEPENDENT_AMBULATORY_CARE_PROVIDER_SITE_OTHER): Payer: Medicare Other

## 2017-02-05 VITALS — BP 122/60 | HR 51 | Ht 66.0 in | Wt 183.0 lb

## 2017-02-05 DIAGNOSIS — I4891 Unspecified atrial fibrillation: Secondary | ICD-10-CM | POA: Diagnosis present

## 2017-02-05 DIAGNOSIS — E119 Type 2 diabetes mellitus without complications: Secondary | ICD-10-CM | POA: Insufficient documentation

## 2017-02-05 DIAGNOSIS — Z7901 Long term (current) use of anticoagulants: Secondary | ICD-10-CM | POA: Diagnosis not present

## 2017-02-05 DIAGNOSIS — Z79899 Other long term (current) drug therapy: Secondary | ICD-10-CM | POA: Diagnosis not present

## 2017-02-05 DIAGNOSIS — Z7984 Long term (current) use of oral hypoglycemic drugs: Secondary | ICD-10-CM | POA: Insufficient documentation

## 2017-02-05 DIAGNOSIS — M25552 Pain in left hip: Secondary | ICD-10-CM | POA: Diagnosis not present

## 2017-02-05 DIAGNOSIS — I10 Essential (primary) hypertension: Secondary | ICD-10-CM | POA: Insufficient documentation

## 2017-02-05 DIAGNOSIS — Z8711 Personal history of peptic ulcer disease: Secondary | ICD-10-CM | POA: Diagnosis not present

## 2017-02-05 DIAGNOSIS — E785 Hyperlipidemia, unspecified: Secondary | ICD-10-CM | POA: Diagnosis not present

## 2017-02-05 DIAGNOSIS — K589 Irritable bowel syndrome without diarrhea: Secondary | ICD-10-CM | POA: Diagnosis not present

## 2017-02-05 DIAGNOSIS — I480 Paroxysmal atrial fibrillation: Secondary | ICD-10-CM | POA: Diagnosis not present

## 2017-02-05 DIAGNOSIS — Z87891 Personal history of nicotine dependence: Secondary | ICD-10-CM | POA: Diagnosis not present

## 2017-02-05 DIAGNOSIS — Z8673 Personal history of transient ischemic attack (TIA), and cerebral infarction without residual deficits: Secondary | ICD-10-CM | POA: Diagnosis not present

## 2017-02-05 MED ORDER — APIXABAN 5 MG PO TABS: 5.0000 mg | ORAL_TABLET | Freq: Two times a day (BID) | ORAL | 0 refills | Status: DC

## 2017-02-05 NOTE — Progress Notes (Deleted)
Left hip pain. Groin pain. Posterior pain with walking.

## 2017-02-05 NOTE — Patient Instructions (Addendum)
Stop ASA   START Eliquis 5 mg, taking one tablet twice a day

## 2017-02-05 NOTE — Progress Notes (Signed)
Tiffany Wiggins - 71 y.o. female MRN [id number]  Date of birth: 1945/05/17  Office Visit Note: Visit Date: 02/05/2017 PCP: Shelda Pal, DO Referred by: Shelda Pal*  Subjective: Chief Complaint  Patient presents with  . Left Hip - Pain   HPI: Tiffany Wiggins is a 71 year old female with severe left hip and groin pain.  Her pain can be episodic with sharp stabbing pains but fairly constant pain.  She has x-ray evidence of severe osteoarthritis of the hip.  She is recently saw Dr. Ninfa Linden who request diagnostic and therapeutic anesthetic hip arthrogram on the left.    ROS Otherwise per HPI.  Assessment & Plan: Visit Diagnoses:  1. Pain in left hip     Plan: Findings:  Diagnostic and hopefully therapeutic anesthetic hip arthrogram the left.  Patient did have relief during the anesthetic phase.    Meds & Orders: No orders of the defined types were placed in this encounter.   Orders Placed This Encounter  Procedures  . Large Joint Inj: L hip joint  . XR C-ARM NO REPORT    Follow-up: Return if symptoms worsen or fail to improve, for Dr. Ninfa Linden.   Procedures: Large Joint Inj: L hip joint on 02/05/2017 1:21 PM Indications: pain and diagnostic evaluation Details: 22 G 3.5 in needle, anterior approach  Arthrogram: Yes  Medications: 80 mg triamcinolone acetonide 40 MG/ML; 3 mL bupivacaine 0.5 % Outcome: tolerated well, no immediate complications  Arthrogram demonstrated excellent flow of contrast throughout the joint surface without extravasation or obvious defect.  The patient had relief of symptoms during the anesthetic phase of the injection.  Procedure, treatment alternatives, risks and benefits explained, specific risks discussed. Consent was given by the patient. Immediately prior to procedure a time out was called to verify the correct patient, procedure, equipment, support staff and site/side marked as required. Patient was prepped and draped in the  usual sterile fashion.      No notes on file   Clinical History: DG HIP (WITH OR WITHOUT PELVIS) 2-3V LEFT  COMPARISON:  Left hip radiographs -07/22/2014  FINDINGS: No fracture or dislocation. Severe degenerative change of the left hip with joint space loss, subchondral sclerosis and osteophytosis. No evidence avascular necrosis  Limited visualization of the pelvis is negative for fracture. Similar-appearing severe degenerative change suspected within the contralateral right hip though incompletely evaluated. At least moderate degenerative change of the lower lumbar spine is suspected though incompletely evaluated  Several phleboliths overlie the lower pelvis bilaterally. Enteric suture line overlies the right lower abdominal quadrant. Scattered vascular calcifications. No radiopaque foreign body.  IMPRESSION: 1. Severe degenerative change of the left hip. 2. Suspected similar appearing severe degenerative change of the contralateral right hip, incompletely evaluated.   Electronically Signed   By: Sandi Mariscal M.D.   On: 12/21/2016 08:14  She reports that she quit smoking about 12 years ago. Her smoking use included cigarettes. she has never used smokeless tobacco.  Recent Labs    09/07/16 1406  HGBA1C 6.0*    Objective:  VS:  HT:    WT:   BMI:     BP:   HR: bpm  TEMP: ( )  RESP:  Physical Exam  Ortho Exam Imaging: No results found.  Past Medical/Family/Surgical/Social History: Medications & Allergies reviewed per EMR Patient Active Problem List   Diagnosis Date Noted  . Pain of left hip joint 01/14/2017  . Unilateral primary osteoarthritis, left hip 01/14/2017  . Cough 02/15/2016  .  Wheezing 02/15/2016  . Perforation of cecum s/p R hemicolectomy with anastamosis (08/25/2015) 08/26/2015  . Thoracic back pain 04/25/2015  . Chronic venous insufficiency 12/21/2014  . Hip arthritis 08/10/2014  . Breast pain, right 07/22/2014  . Thyroid nodule  05/01/2014  . PALPITATIONS, CHRONIC 12/08/2009  . HIP PAIN, BILATERAL 09/15/2009  . OVERWEIGHT 03/08/2008  . Cerebral artery occlusion with cerebral infarction ([place]) 03/08/2008  . Hyperlipemia 01/30/2007  . Essential hypertension 01/30/2007  . Diabetes mellitus type 2, controlled, with complications ([place]) 75/64/3329   Past Medical History:  Diagnosis Date  . Diabetes mellitus type 2 in nonobese (HCC)   . Hyperlipidemia   . Hypertension    PCP GAVE FOR HER  BLOOD PRESSURE  . IBS (irritable bowel syndrome)   . PUD (peptic ulcer disease)   . Stroke [facility])    TIA  BACK IN  04/2004   Family History  Problem Relation Age of Onset  . Cancer Sister        lung  . Diabetes Sister   . Cancer Brother        lung  . Colon cancer Paternal Uncle    Past Surgical History:  Procedure Laterality Date  . ABDOMINAL HYSTERECTOMY    . BIOPSY THYROID  Jan. 2016  . laparoscopy for fertility work up    . OOPHORECTOMY    . TONSILLECTOMY     Social History   Occupational History  . Not on file  Tobacco Use  . Smoking status: Former Smoker    Types: Cigarettes    Last attempt to quit: 04/02/2004    Years since quitting: 12.8  . Smokeless tobacco: Never Used  Substance and Sexual Activity  . Alcohol use: Yes    Alcohol/week: 1.2 oz    Types: 2 Glasses of wine per week  . Drug use: No  . Sexual activity: Not on file

## 2017-02-05 NOTE — Patient Instructions (Signed)

## 2017-02-05 NOTE — Progress Notes (Signed)
Primary Care Physician: Shelda Pal, DO Referring Physician: Dr. Eustaquio Maize is a 71 y.o. female with a h/o stroke with a linq being inserted in 2015 and recently showed some nonsustained afib. She in the afib clinic to discuss starting anticoagulation. Denies any bleeding issues. States that her stroke was years ago and had been on Plavix and asa for some time, now only on ASA. She did not have bleeding issues with this combo. She was unaware of any palpations with recent afib.  Today, she denies symptoms of palpitations, chest pain, shortness of breath, orthopnea, PND, lower extremity edema, dizziness, presyncope, syncope, or neurologic sequela. The patient is tolerating medications without difficulties and is otherwise without complaint today.   Past Medical History:  Diagnosis Date  . Diabetes mellitus type 2 in nonobese (HCC)   . Hyperlipidemia   . Hypertension    PCP GAVE FOR HER  BLOOD PRESSURE  . IBS (irritable bowel syndrome)   . PUD (peptic ulcer disease)   . Stroke [facility])    TIA  BACK IN  04/2004   Past Surgical History:  Procedure Laterality Date  . ABDOMINAL HYSTERECTOMY    . BIOPSY THYROID  Jan. 2016  . laparoscopy for fertility work up    . OOPHORECTOMY    . TONSILLECTOMY      Current Outpatient Medications  Medication Sig Dispense Refill  . acetaminophen (TYLENOL) 500 MG tablet Take 1,000 mg by mouth 2 (two) times daily.    Marland Kitchen amLODipine (NORVASC) 10 MG tablet Take 1 tablet (10 mg total) by mouth daily. 90 tablet 1  . Coenzyme Q10 (COQ10) 100 MG CAPS Take 1 tablet by mouth daily.    . fluticasone (FLONASE) 50 MCG/ACT nasal spray Place 2 sprays into both nostrils daily. 16 g 6  . lisinopril (PRINIVIL,ZESTRIL) 40 MG tablet Take 1 tablet (40 mg total) by mouth daily. 90 tablet 1  . metFORMIN (GLUCOPHAGE) 500 MG tablet Take 1 tablet (500 mg total) by mouth at bedtime. 90 tablet 1  . MULTIPLE VITAMIN PO Take 1 tablet by mouth daily.     Marland Kitchen  omeprazole (PRILOSEC) 20 MG capsule Take 1 capsule (20 mg total) by mouth daily. 90 capsule 1  . pravastatin (PRAVACHOL) 40 MG tablet TAKE 1 TABLET BY MOUTH ONCE DAILY 90 tablet 0  . Vitamin D, Cholecalciferol, 1000 units TABS Take 1 tablet by mouth daily.    Marland Kitchen apixaban (ELIQUIS) 5 MG TABS tablet Take 1 tablet (5 mg total) 2 (two) times daily by mouth. 60 tablet 0  . Ferrous Gluconate (IRON 27 PO) Take 2 mg by mouth 2 (two) times daily.    . Multiple Vitamins-Minerals (VISION-VITE PRESERVE PO) Take 1 tablet by mouth 2 (two) times daily.     No current facility-administered medications for this encounter.     Allergies  Allergen Reactions  . Codeine Nausea Only  . Morphine And Related Itching    Itching     Social History   Socioeconomic History  . Marital status: Married    Spouse name: Not on file  . Number of children: Not on file  . Years of education: Not on file  . Highest education level: Not on file  Social Needs  . Financial resource strain: Not on file  . Food insecurity - worry: Not on file  . Food insecurity - inability: Not on file  . Transportation needs - medical: Not on file  . Transportation needs - non-medical:  Not on file  Occupational History  . Not on file  Tobacco Use  . Smoking status: Former Smoker    Types: Cigarettes    Last attempt to quit: 04/02/2004    Years since quitting: 12.8  . Smokeless tobacco: Never Used  Substance and Sexual Activity  . Alcohol use: Yes    Alcohol/week: 1.2 oz    Types: 2 Glasses of wine per week  . Drug use: No  . Sexual activity: Not on file  Other Topics Concern  . Not on file  Social History Narrative  . Not on file    Family History  Problem Relation Age of Onset  . Cancer Sister        lung  . Diabetes Sister   . Cancer Brother        lung  . Colon cancer Paternal Uncle     ROS- All systems are reviewed and negative except as per the HPI above  Physical Exam: Vitals:   02/05/17 1146  BP: 122/60   Pulse: (!) 51  Weight: 183 lb (83 kg)  Height: 5\' 6"  (1.676 m)   Wt Readings from Last 3 Encounters:  02/05/17 183 lb (83 kg)  12/26/16 182 lb 4 oz (82.7 kg)  12/20/16 182 lb 2 oz (82.6 kg)    Labs: Lab Results  Component Value Date   NA 143 09/07/2016   K 5.0 09/07/2016   CL 108 09/07/2016   CO2 24 09/07/2016   GLUCOSE 188 (H) 09/07/2016   BUN 24 09/07/2016   CREATININE 1.24 (H) 09/07/2016   CALCIUM 9.5 09/07/2016   MG 2.0 09/07/2016   No results found for: INR Lab Results  Component Value Date   CHOL 258 (H) 07/19/2015   HDL 54.30 07/19/2015   TRIG 274.0 (H) 07/19/2015     GEN- The patient is well appearing, alert and oriented x 3 today.   Head- normocephalic, atraumatic Eyes-  Sclera clear, conjunctiva pink Ears- hearing intact Oropharynx- clear Neck- supple, no JVP Lymph- no cervical lymphadenopathy Lungs- Clear to ausculation bilaterally, normal work of breathing Heart- Regular rate and rhythm, no murmurs, rubs or gallops, PMI not laterally displaced GI- soft, NT, ND, + BS Extremities- no clubbing, cyanosis, or edema MS- no significant deformity or atrophy Skin- no rash or lesion Psych- euthymic mood, full affect Neuro- strength and sensation are intact  EKG- Sinus brady at 51 bpm, pr int 136 ms, qrs int 74 ms, qtc 394 ms Epic records reviewed    Assessment and Plan: 1. Paroxysmal afib, low afib burden H/o stroke  Denies  bleeding history Bleeding precautions discussed Discussed anticoagulation options, coumadin, Eliquis and xarelto Pt would like to go with eliquis, qualifies for 5 mg bid based on age, weight and kidney function Stop asa 30 day free RX given  She will return in one month CBC at that time  [place]. Justyce Yeater, [facility] [address] [place], [place] [address] [phone]

## 2017-02-08 ENCOUNTER — Ambulatory Visit (INDEPENDENT_AMBULATORY_CARE_PROVIDER_SITE_OTHER): Payer: Medicare Other | Admitting: *Deleted

## 2017-02-08 DIAGNOSIS — I639 Cerebral infarction, unspecified: Secondary | ICD-10-CM | POA: Diagnosis not present

## 2017-02-10 MED ORDER — BUPIVACAINE HCL 0.5 % IJ SOLN
3.0000 mL | INTRAMUSCULAR | Status: AC | PRN
  Administered 2017-02-05: 3 mL via INTRA_ARTICULAR

## 2017-02-10 MED ORDER — TRIAMCINOLONE ACETONIDE 40 MG/ML IJ SUSP
80.0000 mg | INTRAMUSCULAR | Status: AC | PRN
  Administered 2017-02-05: 80 mg via INTRA_ARTICULAR

## 2017-02-11 ENCOUNTER — Ambulatory Visit (INDEPENDENT_AMBULATORY_CARE_PROVIDER_SITE_OTHER): Payer: Medicare Other | Admitting: Orthopaedic Surgery

## 2017-02-11 NOTE — Progress Notes (Signed)
Carelink Summary Report / Loop Recorder 

## 2017-02-14 LAB — CUP PACEART REMOTE DEVICE CHECK
Implantable Pulse Generator Implant Date: 20151223
MDC IDC SESS DTM: 20181109224119

## 2017-02-23 ENCOUNTER — Other Ambulatory Visit: Payer: Self-pay | Admitting: Family Medicine

## 2017-02-25 ENCOUNTER — Ambulatory Visit (INDEPENDENT_AMBULATORY_CARE_PROVIDER_SITE_OTHER): Payer: Medicare Other | Admitting: Orthopaedic Surgery

## 2017-03-01 ENCOUNTER — Ambulatory Visit: Payer: Medicare Other | Admitting: Internal Medicine

## 2017-03-01 ENCOUNTER — Encounter: Payer: Self-pay | Admitting: Internal Medicine

## 2017-03-01 VITALS — BP 136/60 | HR 48 | Ht 66.0 in | Wt 177.0 lb

## 2017-03-01 DIAGNOSIS — I639 Cerebral infarction, unspecified: Secondary | ICD-10-CM

## 2017-03-01 DIAGNOSIS — R001 Bradycardia, unspecified: Secondary | ICD-10-CM

## 2017-03-01 DIAGNOSIS — I480 Paroxysmal atrial fibrillation: Secondary | ICD-10-CM | POA: Diagnosis not present

## 2017-03-01 LAB — CUP PACEART INCLINIC DEVICE CHECK
Date Time Interrogation Session: 20181130121457
MDC IDC PG IMPLANT DT: 20151223

## 2017-03-01 NOTE — Progress Notes (Signed)
Patient Care Team: Raina Mina., MD as PCP - General (Internal Medicine) Jovita Kussmaul, MD as Consulting Physician (General Surgery) Mauri Pole, MD as Consulting Physician (Gastroenterology)   HPI  Tiffany Wiggins is a 71 y.o. female Seen in follow-up for history of palpitations with cryptogenic stroke is status post loop recorder insertion\  10/18 atrial fibrillation was identified on her 38.  She was Rx on anticoagulation with apixaban but she has failed to take it.  Is a great deal of emotional stress.  She is having cognitive issues with concentration.  She has a great deal of worry related to her dog.  Also focused on more pastsins and dealing with guilt     Complaining of exertional chest discomfort with some sob  Had undergone cath 2015 with no obstructive disease    DATE TEST    2015 Cath   EF 70 %   4/18 Myoview   EF 60 % No ischemia              Past Medical History:  Diagnosis Date  . Diabetes mellitus type 2 in nonobese (HCC)   . Hyperlipidemia   . Hypertension    PCP GAVE FOR HER  BLOOD PRESSURE  . IBS (irritable bowel syndrome)   . PUD (peptic ulcer disease)   . Stroke [facility])    TIA  BACK IN  04/2004    Past Surgical History:  Procedure Laterality Date  . ABDOMINAL HYSTERECTOMY    . BIOPSY THYROID  Jan. 2016  . LAPAROSCOPY N/A 08/25/2015   Procedure: EXPLORATORY LAPAROTOMY, ILEOCECECTOMY, PRIMARY ANASTOMOSIS, LYSIS OF ADHESIONS;  Surgeon: Autumn Messing III, MD;  Location: [facility] ORS;  Service: General;  Laterality: N/A;  . laparoscopy for fertility work up    . LOOP RECORDER IMPLANT N/A 03/24/2014   Procedure: LOOP RECORDER IMPLANT;  Surgeon: Deboraha Sprang, MD;  Location: [facility] CATH LAB;  Service: Cardiovascular;  Laterality: N/A;  . LUMBAR LAMINECTOMY/DECOMPRESSION MICRODISCECTOMY  04/25/2011   Procedure: LUMBAR LAMINECTOMY/DECOMPRESSION MICRODISCECTOMY;  Surgeon: [name], MD;  Location: [facility] NEURO ORS;  Service: Neurosurgery;   Laterality: Right;  Right Lumbar Four-Five Discectomy  . OOPHORECTOMY    . TONSILLECTOMY      Current Outpatient Medications  Medication Sig Dispense Refill  . acetaminophen (TYLENOL) 500 MG tablet Take 1,000 mg by mouth 2 (two) times daily.    Marland Kitchen amLODipine (NORVASC) 10 MG tablet Take 1 tablet (10 mg total) by mouth daily. 90 tablet 1  . apixaban (ELIQUIS) 5 MG TABS tablet Take 1 tablet (5 mg total) 2 (two) times daily by mouth. 60 tablet 0  . Coenzyme Q10 (COQ10) 100 MG CAPS Take 1 tablet by mouth daily.    . Ferrous Gluconate (IRON 27 PO) Take 2 mg by mouth 2 (two) times daily.    . fluticasone (FLONASE) 50 MCG/ACT nasal spray Place 2 sprays into both nostrils daily. 16 g 6  . lisinopril (PRINIVIL,ZESTRIL) 40 MG tablet Take 1 tablet (40 mg total) by mouth daily. 90 tablet 1  . metFORMIN (GLUCOPHAGE) 500 MG tablet TAKE 1 TABLET BY MOUTH AT BEDTIME 90 tablet 1  . MULTIPLE VITAMIN PO Take 1 tablet by mouth daily.     . Multiple Vitamins-Minerals (VISION-VITE PRESERVE PO) Take 1 tablet by mouth 2 (two) times daily.    Marland Kitchen omeprazole (PRILOSEC) 20 MG capsule Take 1 capsule (20 mg total) by mouth daily. 90 capsule 1  . pravastatin (PRAVACHOL) 40  MG tablet TAKE 1 TABLET BY MOUTH ONCE DAILY 90 tablet 0  . Vitamin D, Cholecalciferol, 1000 units TABS Take 1 tablet by mouth daily.     No current facility-administered medications for this visit.     Allergies  Allergen Reactions  . Codeine Nausea Only  . Morphine And Related Itching    Itching       Review of Systems negative except from HPI and PMH  Physical Exam BP 136/60   Pulse (!) 48   Ht 5\' 6"  (1.676 m)   Wt 177 lb (80.3 kg)   SpO2 98%   BMI 28.57 kg/m  Well developed and well nourished in no acute distress HENT normal E scleral and icterus clear Neck Supple JVP flat; carotids brisk and full B Bruits R>L  Clear to ausculation  Regular rate and rhythm, 2/6 gallops or rub Soft with active bowel sounds No clubbing cyanosis   Edema Alert and oriented, grossly normal motor and sensory function Skin Warm and Dry tearful  ECG Personally reviewed from 6 November demonstrated sinus rhythm at 51  Assessment and  Plan Cryptogenic stroke  Implantable loop recorder  Hypertension  Sinus bradycardia  Chest discomfort  Exertional  Carotid bruits   Atrial fibrillation   Emotional Lability  Cramps    We discussed the paucity of data regarding the benefits of anticoagulation with SCAF in a population of cryptogenic stroke.  We reviewed the [facility] data as relates to the safety of apixaban versus aspirin.  She is also having significant night cramps.  Her electrolytes have been fine.  I encouraged her to continue taking OTC magnesium.  Her emotional lability and cognitive dysfunction are impairing of life.  I have suggested that neuropsychiatric evaluation might be of value.  There is clearly a depressive component of this related to her dog and perhaps other things.  She will follow-up with Dr. Bea Graff  but I suggested that tertiary care centers may be more able to help here  .50 More than 50% of 40 min was spent in counseling related to the above

## 2017-03-01 NOTE — Patient Instructions (Addendum)
Medication Instructions:  Your physician recommends that you continue on your current medications as directed. Please refer to the Current Medication list given to you today.  -- If you need a refill on your cardiac medications before your next appointment, please call your pharmacy. --  Labwork: None ordered  Testing/Procedures: None ordered  Follow-Up: Your physician wants you to follow-up in: 1 year with Dr. Gari Crown will receive a reminder letter in the mail two months in advance. If you don't receive a letter, please call our office to schedule the follow-up appointment.  Thank you for choosing [facility]!!    [phone]  Any Other Special Instructions Will Be Listed Below (If Applicable).

## 2017-03-11 ENCOUNTER — Ambulatory Visit (INDEPENDENT_AMBULATORY_CARE_PROVIDER_SITE_OTHER): Payer: Medicare Other | Admitting: *Deleted

## 2017-03-11 ENCOUNTER — Ambulatory Visit (HOSPITAL_COMMUNITY): Payer: Medicare Other | Admitting: Nurse Practitioner

## 2017-03-11 DIAGNOSIS — I639 Cerebral infarction, unspecified: Secondary | ICD-10-CM

## 2017-03-11 NOTE — Progress Notes (Signed)
Carelink Summary Report / Loop Recorder 

## 2017-03-18 LAB — CUP PACEART REMOTE DEVICE CHECK
Implantable Pulse Generator Implant Date: 20151223
MDC IDC SESS DTM: 20181209230936

## 2017-03-20 ENCOUNTER — Other Ambulatory Visit: Payer: Self-pay | Admitting: Family Medicine

## 2017-03-20 DIAGNOSIS — E538 Deficiency of other specified B group vitamins: Secondary | ICD-10-CM | POA: Insufficient documentation

## 2017-03-30 ENCOUNTER — Other Ambulatory Visit: Payer: Self-pay | Admitting: Family Medicine

## 2017-04-02 DIAGNOSIS — I639 Cerebral infarction, unspecified: Secondary | ICD-10-CM

## 2017-04-02 HISTORY — DX: Cerebral infarction, unspecified: I63.9

## 2017-04-09 ENCOUNTER — Ambulatory Visit (INDEPENDENT_AMBULATORY_CARE_PROVIDER_SITE_OTHER): Payer: Medicare Other | Admitting: *Deleted

## 2017-04-09 DIAGNOSIS — I639 Cerebral infarction, unspecified: Secondary | ICD-10-CM | POA: Diagnosis not present

## 2017-04-10 NOTE — Progress Notes (Signed)
Carelink Summary Report / Loop Recorder 

## 2017-04-22 LAB — CUP PACEART REMOTE DEVICE CHECK
Date Time Interrogation Session: 20190108231113
MDC IDC PG IMPLANT DT: 20151223

## 2017-05-09 ENCOUNTER — Ambulatory Visit (INDEPENDENT_AMBULATORY_CARE_PROVIDER_SITE_OTHER): Payer: Medicare Other | Admitting: *Deleted

## 2017-05-09 DIAGNOSIS — I639 Cerebral infarction, unspecified: Secondary | ICD-10-CM | POA: Diagnosis not present

## 2017-05-13 NOTE — Progress Notes (Signed)
Carelink Summary Report / Loop Recorder 

## 2017-05-29 DIAGNOSIS — N1831 Chronic kidney disease, stage 3a: Secondary | ICD-10-CM | POA: Insufficient documentation

## 2017-06-04 LAB — CUP PACEART REMOTE DEVICE CHECK
Implantable Pulse Generator Implant Date: 20151223
MDC IDC SESS DTM: 20190207230942

## 2017-06-10 IMAGING — NM NM MISC PROCEDURE
6 series · 36 of 36 positions shown · non-contrast
Comparison: none

[Series 1: wbr_r-proj_st rest · 6.40mm/px · 6 of 64 frames shown]
[frame 6/64]
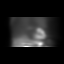
[frame 16/64]
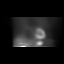
[frame 27/64]
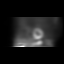
[frame 38/64]
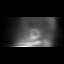
[frame 48/64]
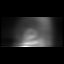
[frame 59/64]
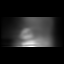

[Series 1: stress-sum-em · 6.40mm/px · 6 of 64 frames shown]
[frame 6/64]
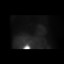
[frame 16/64]
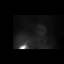
[frame 27/64]
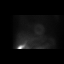
[frame 38/64]
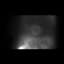
[frame 48/64]
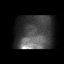
[frame 59/64]
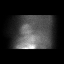

[Series 1: wbr_s-proj_st stress-gsp · 6.40mm/px · 6 of 512 frames shown]
[frame 43/512]
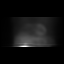
[frame 128/512]
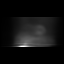
[frame 214/512]
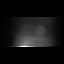
[frame 299/512]
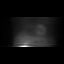
[frame 384/512]
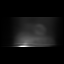
[frame 470/512]
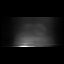

[Series 1: wbr_s-proj_st stress-sum-em · 6.40mm/px · 6 of 64 frames shown]
[frame 6/64]
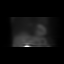
[frame 16/64]
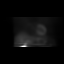
[frame 27/64]
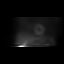
[frame 38/64]
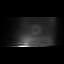
[frame 48/64]
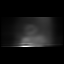
[frame 59/64]
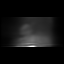

[Series 1: rest · 6.40mm/px · 6 of 64 frames shown]
[frame 6/64]
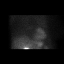
[frame 16/64]
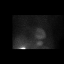
[frame 27/64]
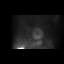
[frame 38/64]
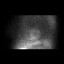
[frame 48/64]
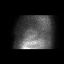
[frame 59/64]
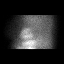

[Series 1: stress-gsp · 6.40mm/px · 6 of 512 frames shown]
[frame 43/512]
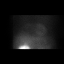
[frame 128/512]
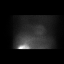
[frame 214/512]
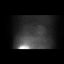
[frame 299/512]
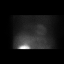
[frame 384/512]
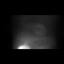
[frame 470/512]
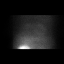

[36 of 36 positions shown; findings below may reference images not displayed]

Canned report from images found in remote index.

Refer to host system for actual result text.

## 2017-06-11 ENCOUNTER — Ambulatory Visit (INDEPENDENT_AMBULATORY_CARE_PROVIDER_SITE_OTHER): Payer: Medicare Other | Admitting: *Deleted

## 2017-06-11 DIAGNOSIS — I639 Cerebral infarction, unspecified: Secondary | ICD-10-CM

## 2017-06-12 NOTE — Progress Notes (Signed)
Carelink Summary Report / Loop Recorder 

## 2017-06-14 ENCOUNTER — Other Ambulatory Visit: Payer: Self-pay | Admitting: Family Medicine

## 2017-06-18 ENCOUNTER — Other Ambulatory Visit: Payer: Self-pay | Admitting: Family Medicine

## 2017-07-10 ENCOUNTER — Other Ambulatory Visit: Payer: Self-pay | Admitting: Internal Medicine

## 2017-07-10 DIAGNOSIS — I6523 Occlusion and stenosis of bilateral carotid arteries: Secondary | ICD-10-CM

## 2017-07-10 DIAGNOSIS — I669 Occlusion and stenosis of unspecified cerebral artery: Secondary | ICD-10-CM

## 2017-07-15 ENCOUNTER — Ambulatory Visit (INDEPENDENT_AMBULATORY_CARE_PROVIDER_SITE_OTHER): Payer: Medicare Other | Admitting: *Deleted

## 2017-07-15 DIAGNOSIS — I639 Cerebral infarction, unspecified: Secondary | ICD-10-CM | POA: Diagnosis not present

## 2017-07-15 NOTE — Progress Notes (Signed)
Carelink Summary Report / Loop Recorder 

## 2017-07-22 ENCOUNTER — Ambulatory Visit (HOSPITAL_COMMUNITY)
Admission: RE | Admit: 2017-07-22 | Payer: Medicare Other | Source: Ambulatory Visit | Attending: Internal Medicine | Admitting: Internal Medicine

## 2017-07-22 LAB — CUP PACEART REMOTE DEVICE CHECK
Date Time Interrogation Session: 20190312233735
MDC IDC PG IMPLANT DT: 20151223

## 2017-07-29 ENCOUNTER — Encounter (HOSPITAL_COMMUNITY): Payer: Medicare Other

## 2017-07-30 ENCOUNTER — Ambulatory Visit (HOSPITAL_COMMUNITY)
Admission: RE | Admit: 2017-07-30 | Discharge: 2017-07-30 | Disposition: A | Discharge status: Home / Self Care | Payer: Medicare Other | Source: Ambulatory Visit | Attending: Internal Medicine | Admitting: Internal Medicine

## 2017-07-30 DIAGNOSIS — I669 Occlusion and stenosis of unspecified cerebral artery: Secondary | ICD-10-CM

## 2017-07-30 DIAGNOSIS — I6523 Occlusion and stenosis of bilateral carotid arteries: Secondary | ICD-10-CM | POA: Diagnosis not present

## 2017-08-02 ENCOUNTER — Telehealth: Payer: Self-pay

## 2017-08-02 NOTE — Telephone Encounter (Signed)
-----   Message from Deboraha Sprang, MD sent at 08/02/2017 12:33 PM EDT ----- Please Inform Patient that  Carotid studyis abnormal and will require repeat eval In 12 months  Thanks

## 2017-08-02 NOTE — Telephone Encounter (Signed)
Spoke with pt regarding her VAS carotid studies. Results look similar to last years study. Pt states she is not taking anticoagulation, only a baby aspirin. She has c/o head fog and memory issues. I advised her to speak with her PCP regarding memory. She understands she will need a repeat study next year. She had no additional questions.

## 2017-08-13 ENCOUNTER — Telehealth: Payer: Self-pay | Admitting: *Deleted

## 2017-08-13 NOTE — Telephone Encounter (Signed)
Spoke with patient regarding LINQ at [facility] as of 08/11/17.  Patient wishes to discuss explant as it causes her discomfort when she sleeps.  She is aware that a scheduler will contact her with an appointment.  Patient agrees to unplug home monitor.  Unenrolled from [place], return kit ordered to home address on file.

## 2017-08-14 LAB — CUP PACEART REMOTE DEVICE CHECK
Date Time Interrogation Session: 20190415000621
MDC IDC PG IMPLANT DT: 20151223

## 2017-08-16 ENCOUNTER — Ambulatory Visit (INDEPENDENT_AMBULATORY_CARE_PROVIDER_SITE_OTHER): Payer: Medicare Other | Admitting: *Deleted

## 2017-08-16 DIAGNOSIS — I639 Cerebral infarction, unspecified: Secondary | ICD-10-CM | POA: Diagnosis not present

## 2017-08-16 NOTE — Telephone Encounter (Signed)
Patient is scheduled on 10/02/17.

## 2017-08-19 NOTE — Progress Notes (Signed)
Carelink Summary Report / Loop Recorder 

## 2017-09-10 LAB — CUP PACEART REMOTE DEVICE CHECK
Implantable Pulse Generator Implant Date: 20151223
MDC IDC SESS DTM: 20190518000710

## 2017-09-16 DIAGNOSIS — F02B0 Dementia in other diseases classified elsewhere, moderate, without behavioral disturbance, psychotic disturbance, mood disturbance, and anxiety: Secondary | ICD-10-CM | POA: Insufficient documentation

## 2017-09-16 DIAGNOSIS — G301 Alzheimer's disease with late onset: Secondary | ICD-10-CM | POA: Insufficient documentation

## 2017-09-25 ENCOUNTER — Other Ambulatory Visit: Payer: Self-pay | Admitting: *Deleted

## 2017-09-25 DIAGNOSIS — I739 Peripheral vascular disease, unspecified: Principal | ICD-10-CM

## 2017-09-25 DIAGNOSIS — I779 Disorder of arteries and arterioles, unspecified: Secondary | ICD-10-CM

## 2017-09-26 ENCOUNTER — Encounter: Payer: Self-pay | Admitting: Internal Medicine

## 2017-10-01 DIAGNOSIS — G8929 Other chronic pain: Secondary | ICD-10-CM | POA: Insufficient documentation

## 2017-10-02 ENCOUNTER — Encounter: Payer: Medicare Other | Admitting: Internal Medicine

## 2017-10-02 ENCOUNTER — Encounter

## 2017-10-17 ENCOUNTER — Other Ambulatory Visit: Payer: Self-pay | Admitting: Internal Medicine

## 2017-11-13 ENCOUNTER — Other Ambulatory Visit: Payer: Self-pay | Admitting: Orthopedic Surgery

## 2017-11-13 DIAGNOSIS — M25511 Pain in right shoulder: Secondary | ICD-10-CM

## 2017-11-14 ENCOUNTER — Other Ambulatory Visit: Payer: Self-pay | Admitting: Internal Medicine

## 2017-11-14 DIAGNOSIS — Z1231 Encounter for screening mammogram for malignant neoplasm of breast: Secondary | ICD-10-CM

## 2017-11-26 ENCOUNTER — Ambulatory Visit
Admission: RE | Admit: 2017-11-26 | Discharge: 2017-11-26 | Disposition: A | Discharge status: Home / Self Care | Payer: Medicare Other | Source: Ambulatory Visit | Attending: Orthopedic Surgery | Admitting: Orthopedic Surgery

## 2017-11-26 DIAGNOSIS — M25511 Pain in right shoulder: Secondary | ICD-10-CM

## 2017-12-11 ENCOUNTER — Ambulatory Visit
Admission: RE | Admit: 2017-12-11 | Discharge: 2017-12-11 | Disposition: A | Discharge status: Home / Self Care | Payer: Medicare Other | Source: Ambulatory Visit | Attending: Internal Medicine | Admitting: Internal Medicine

## 2017-12-11 DIAGNOSIS — Z1231 Encounter for screening mammogram for malignant neoplasm of breast: Secondary | ICD-10-CM

## 2017-12-17 DIAGNOSIS — D692 Other nonthrombocytopenic purpura: Secondary | ICD-10-CM | POA: Insufficient documentation

## 2018-05-05 ENCOUNTER — Encounter: Payer: Medicare Other | Admitting: Internal Medicine

## 2018-05-12 ENCOUNTER — Encounter: Payer: Medicare Other | Admitting: Internal Medicine

## 2018-07-24 ENCOUNTER — Encounter: Payer: Medicare Other | Admitting: Internal Medicine

## 2018-08-13 ENCOUNTER — Encounter: Payer: Self-pay | Admitting: Gastroenterology

## 2018-09-11 ENCOUNTER — Other Ambulatory Visit (HOSPITAL_COMMUNITY): Payer: Self-pay | Admitting: Internal Medicine

## 2018-09-11 ENCOUNTER — Other Ambulatory Visit: Payer: Self-pay

## 2018-09-11 ENCOUNTER — Ambulatory Visit (HOSPITAL_COMMUNITY)
Admission: RE | Admit: 2018-09-11 | Discharge: 2018-09-11 | Disposition: A | Discharge status: Home / Self Care | Payer: Medicare Other | Source: Ambulatory Visit | Attending: Cardiology | Admitting: Cardiology

## 2018-09-11 DIAGNOSIS — I739 Peripheral vascular disease, unspecified: Secondary | ICD-10-CM | POA: Insufficient documentation

## 2018-09-11 DIAGNOSIS — I6523 Occlusion and stenosis of bilateral carotid arteries: Secondary | ICD-10-CM

## 2018-09-11 DIAGNOSIS — I779 Disorder of arteries and arterioles, unspecified: Secondary | ICD-10-CM

## 2018-11-14 ENCOUNTER — Other Ambulatory Visit: Payer: Self-pay | Admitting: Internal Medicine

## 2018-11-14 DIAGNOSIS — Z1231 Encounter for screening mammogram for malignant neoplasm of breast: Secondary | ICD-10-CM

## 2018-12-08 ENCOUNTER — Emergency Department (HOSPITAL_COMMUNITY): Payer: Medicare Other

## 2018-12-08 ENCOUNTER — Inpatient Hospital Stay (HOSPITAL_COMMUNITY): Payer: Medicare Other

## 2018-12-08 ENCOUNTER — Inpatient Hospital Stay (HOSPITAL_COMMUNITY)
Admission: EM | Admit: 2018-12-08 | Discharge: 2018-12-10 | DRG: 309 | Disposition: A | Discharge status: Home / Self Care | Payer: Medicare Other | Attending: Internal Medicine | Admitting: Internal Medicine

## 2018-12-08 ENCOUNTER — Encounter (HOSPITAL_COMMUNITY): Payer: Self-pay | Admitting: Internal Medicine

## 2018-12-08 DIAGNOSIS — K589 Irritable bowel syndrome without diarrhea: Secondary | ICD-10-CM | POA: Diagnosis present

## 2018-12-08 DIAGNOSIS — Z8673 Personal history of transient ischemic attack (TIA), and cerebral infarction without residual deficits: Secondary | ICD-10-CM | POA: Diagnosis not present

## 2018-12-08 DIAGNOSIS — E1122 Type 2 diabetes mellitus with diabetic chronic kidney disease: Secondary | ICD-10-CM | POA: Diagnosis present

## 2018-12-08 DIAGNOSIS — E785 Hyperlipidemia, unspecified: Secondary | ICD-10-CM | POA: Diagnosis present

## 2018-12-08 DIAGNOSIS — R079 Chest pain, unspecified: Secondary | ICD-10-CM

## 2018-12-08 DIAGNOSIS — I214 Non-ST elevation (NSTEMI) myocardial infarction: Secondary | ICD-10-CM | POA: Diagnosis present

## 2018-12-08 DIAGNOSIS — E7800 Pure hypercholesterolemia, unspecified: Secondary | ICD-10-CM | POA: Diagnosis not present

## 2018-12-08 DIAGNOSIS — N189 Chronic kidney disease, unspecified: Secondary | ICD-10-CM | POA: Diagnosis present

## 2018-12-08 DIAGNOSIS — I25119 Atherosclerotic heart disease of native coronary artery with unspecified angina pectoris: Secondary | ICD-10-CM | POA: Diagnosis present

## 2018-12-08 DIAGNOSIS — I2510 Atherosclerotic heart disease of native coronary artery without angina pectoris: Secondary | ICD-10-CM | POA: Diagnosis present

## 2018-12-08 DIAGNOSIS — Z20828 Contact with and (suspected) exposure to other viral communicable diseases: Secondary | ICD-10-CM | POA: Diagnosis present

## 2018-12-08 DIAGNOSIS — Z7982 Long term (current) use of aspirin: Secondary | ICD-10-CM

## 2018-12-08 DIAGNOSIS — Z79899 Other long term (current) drug therapy: Secondary | ICD-10-CM

## 2018-12-08 DIAGNOSIS — I129 Hypertensive chronic kidney disease with stage 1 through stage 4 chronic kidney disease, or unspecified chronic kidney disease: Secondary | ICD-10-CM | POA: Diagnosis present

## 2018-12-08 DIAGNOSIS — Z7951 Long term (current) use of inhaled steroids: Secondary | ICD-10-CM

## 2018-12-08 DIAGNOSIS — I248 Other forms of acute ischemic heart disease: Secondary | ICD-10-CM | POA: Diagnosis present

## 2018-12-08 DIAGNOSIS — Z7984 Long term (current) use of oral hypoglycemic drugs: Secondary | ICD-10-CM

## 2018-12-08 DIAGNOSIS — I10 Essential (primary) hypertension: Secondary | ICD-10-CM | POA: Diagnosis not present

## 2018-12-08 DIAGNOSIS — E118 Type 2 diabetes mellitus with unspecified complications: Secondary | ICD-10-CM | POA: Diagnosis not present

## 2018-12-08 DIAGNOSIS — R072 Precordial pain: Secondary | ICD-10-CM

## 2018-12-08 DIAGNOSIS — I480 Paroxysmal atrial fibrillation: Principal | ICD-10-CM | POA: Diagnosis present

## 2018-12-08 DIAGNOSIS — I700 Atherosclerosis of aorta: Secondary | ICD-10-CM | POA: Diagnosis not present

## 2018-12-08 LAB — URINALYSIS, ROUTINE W REFLEX MICROSCOPIC
Bilirubin Urine: NEGATIVE
Glucose, UA: NEGATIVE mg/dL
Hgb urine dipstick: NEGATIVE
Ketones, ur: NEGATIVE mg/dL
Leukocytes,Ua: NEGATIVE
Nitrite: NEGATIVE
Protein, ur: NEGATIVE mg/dL
Specific Gravity, Urine: 1.005 (ref 1.005–1.030)
pH: 7 (ref 5.0–8.0)

## 2018-12-08 LAB — COMPREHENSIVE METABOLIC PANEL
ALT: 16 U/L (ref 0–44)
AST: 21 U/L (ref 15–41)
Albumin: 3.8 g/dL (ref 3.5–5.0)
Alkaline Phosphatase: 54 U/L (ref 38–126)
Anion gap: 12 (ref 5–15)
BUN: 24 mg/dL — ABNORMAL HIGH (ref 8–23)
CO2: 24 mmol/L (ref 22–32)
Calcium: 9.7 mg/dL (ref 8.9–10.3)
Chloride: 108 mmol/L (ref 98–111)
Creatinine, Ser: 1.1 mg/dL — ABNORMAL HIGH (ref 0.44–1.00)
GFR calc Af Amer: 58 mL/min — ABNORMAL LOW (ref >60)
GFR calc non Af Amer: 50 mL/min — ABNORMAL LOW (ref >60)
Glucose, Bld: 109 mg/dL — ABNORMAL HIGH (ref 70–99)
Potassium: 4.2 mmol/L (ref 3.5–5.1)
Sodium: 144 mmol/L (ref 135–145)
Total Bilirubin: 0.4 mg/dL (ref 0.3–1.2)
Total Protein: 6.4 g/dL — ABNORMAL LOW (ref 6.5–8.1)

## 2018-12-08 LAB — HEMOGLOBIN A1C
Hgb A1c MFr Bld: 5.8 % — ABNORMAL HIGH (ref 4.8–5.6)
Mean Plasma Glucose: 119.76 mg/dL

## 2018-12-08 LAB — SARS CORONAVIRUS 2 BY RT PCR (HOSPITAL ORDER, PERFORMED IN ~~LOC~~ HOSPITAL LAB): SARS Coronavirus 2: NEGATIVE

## 2018-12-08 LAB — CBC WITH DIFFERENTIAL/PLATELET
Abs Immature Granulocytes: 0.02 10*3/uL (ref 0.00–0.07)
Basophils Absolute: 0 10*3/uL (ref 0.0–0.1)
Basophils Relative: 1 %
Eosinophils Absolute: 0.2 10*3/uL (ref 0.0–0.5)
Eosinophils Relative: 3 %
HCT: 41.1 % (ref 36.0–46.0)
Hemoglobin: 12.9 g/dL (ref 12.0–15.0)
Immature Granulocytes: 0 %
Lymphocytes Relative: 39 %
Lymphs Abs: 3.5 10*3/uL (ref 0.7–4.0)
MCH: 32.7 pg (ref 26.0–34.0)
MCHC: 31.4 g/dL (ref 30.0–36.0)
MCV: 104.3 fL — ABNORMAL HIGH (ref 80.0–100.0)
Monocytes Absolute: 0.6 10*3/uL (ref 0.1–1.0)
Monocytes Relative: 6 %
Neutro Abs: 4.5 10*3/uL (ref 1.7–7.7)
Neutrophils Relative %: 51 %
Platelets: 186 10*3/uL (ref 150–400)
RBC: 3.94 MIL/uL (ref 3.87–5.11)
RDW: 12.1 % (ref 11.5–15.5)
WBC: 8.8 10*3/uL (ref 4.0–10.5)
nRBC: 0 % (ref 0.0–0.2)

## 2018-12-08 LAB — GLUCOSE, CAPILLARY
Glucose-Capillary: 105 mg/dL — ABNORMAL HIGH (ref 70–99)
Glucose-Capillary: 112 mg/dL — ABNORMAL HIGH (ref 70–99)

## 2018-12-08 LAB — LIPID PANEL
Cholesterol: 289 mg/dL — ABNORMAL HIGH (ref 0–200)
HDL: 56 mg/dL (ref >40)
LDL Cholesterol: 210 mg/dL — ABNORMAL HIGH (ref 0–99)
Total CHOL/HDL Ratio: 5.2 RATIO
Triglycerides: 117 mg/dL (ref <150)
VLDL: 23 mg/dL (ref 0–40)

## 2018-12-08 LAB — TSH: TSH: 1.717 u[IU]/mL (ref 0.350–4.500)

## 2018-12-08 LAB — TROPONIN I (HIGH SENSITIVITY)
Troponin I (High Sensitivity): 9 ng/L (ref <18)
Troponin I (High Sensitivity): 27 ng/L — ABNORMAL HIGH (ref <18)
Troponin I (High Sensitivity): 55 ng/L — ABNORMAL HIGH (ref <18)
Troponin I (High Sensitivity): 52 ng/L — ABNORMAL HIGH (ref <18)

## 2018-12-08 LAB — HEPARIN LEVEL (UNFRACTIONATED): Heparin Unfractionated: 0.39 IU/mL (ref 0.30–0.70)

## 2018-12-08 LAB — ECHOCARDIOGRAM COMPLETE
Height: 66 in
Weight: 2208 oz

## 2018-12-08 LAB — D-DIMER, QUANTITATIVE: D-Dimer, Quant: 1.99 ug/mL-FEU — ABNORMAL HIGH (ref 0.00–0.50)

## 2018-12-08 LAB — BRAIN NATRIURETIC PEPTIDE: B Natriuretic Peptide: 122.5 pg/mL — ABNORMAL HIGH (ref 0.0–100.0)

## 2018-12-08 LAB — CBG MONITORING, ED: Glucose-Capillary: 95 mg/dL (ref 70–99)

## 2018-12-08 MED ORDER — LOSARTAN POTASSIUM 25 MG PO TABS
25.0000 mg | ORAL_TABLET | Freq: Every day | ORAL | Status: DC
  Administered 2018-12-08 – 2018-12-10 (×3): 25 mg via ORAL
  Filled 2018-12-08 (×3): qty 1

## 2018-12-08 MED ORDER — AMLODIPINE BESYLATE 10 MG PO TABS
10.0000 mg | ORAL_TABLET | Freq: Every day | ORAL | Status: DC
  Administered 2018-12-08 – 2018-12-10 (×3): 10 mg via ORAL
  Filled 2018-12-08: qty 2
  Filled 2018-12-08 (×2): qty 1

## 2018-12-08 MED ORDER — INSULIN ASPART 100 UNIT/ML ~~LOC~~ SOLN
0.0000 [IU] | Freq: Three times a day (TID) | SUBCUTANEOUS | Status: DC
  Administered 2018-12-09: 2 [IU] via SUBCUTANEOUS

## 2018-12-08 MED ORDER — SODIUM CHLORIDE 0.9% FLUSH: 3.0000 mL | INTRAVENOUS | Status: DC | PRN

## 2018-12-08 MED ORDER — HEPARIN (PORCINE) 25000 UT/250ML-% IV SOLN
850.0000 [IU]/h | INTRAVENOUS | Status: DC
  Administered 2018-12-08 – 2018-12-09 (×2): 850 [IU]/h via INTRAVENOUS
  Filled 2018-12-08 (×2): qty 250

## 2018-12-08 MED ORDER — INSULIN ASPART 100 UNIT/ML ~~LOC~~ SOLN: 0.0000 [IU] | Freq: Every day | SUBCUTANEOUS | Status: DC

## 2018-12-08 MED ORDER — ATORVASTATIN CALCIUM 40 MG PO TABS
40.0000 mg | ORAL_TABLET | Freq: Every day | ORAL | Status: DC
  Administered 2018-12-08 – 2018-12-09 (×2): 40 mg via ORAL
  Filled 2018-12-08 (×2): qty 1

## 2018-12-08 MED ORDER — ONDANSETRON HCL 4 MG/2ML IJ SOLN: 4.0000 mg | Freq: Four times a day (QID) | INTRAMUSCULAR | Status: DC | PRN

## 2018-12-08 MED ORDER — HEPARIN BOLUS VIA INFUSION
3000.0000 [IU] | Freq: Once | INTRAVENOUS | Status: AC
  Administered 2018-12-08: 06:00:00 3000 [IU] via INTRAVENOUS
  Filled 2018-12-08: qty 3000

## 2018-12-08 MED ORDER — NITROGLYCERIN 0.4 MG SL SUBL
0.4000 mg | SUBLINGUAL_TABLET | SUBLINGUAL | Status: DC | PRN
  Administered 2018-12-09: 15:00:00 0.8 mg via SUBLINGUAL

## 2018-12-08 MED ORDER — ASPIRIN 81 MG PO CHEW
81.0000 mg | CHEWABLE_TABLET | Freq: Every day | ORAL | Status: DC
  Administered 2018-12-08 – 2018-12-09 (×2): 81 mg via ORAL
  Filled 2018-12-08 (×2): qty 1

## 2018-12-08 MED ORDER — ASPIRIN 81 MG PO CHEW
324.0000 mg | CHEWABLE_TABLET | Freq: Once | ORAL | Status: AC
  Administered 2018-12-08: 03:00:00 324 mg via ORAL
  Filled 2018-12-08: qty 4

## 2018-12-08 MED ORDER — FLUTICASONE PROPIONATE 50 MCG/ACT NA SUSP
2.0000 | Freq: Every day | NASAL | Status: DC
  Administered 2018-12-09: 2 via NASAL
  Filled 2018-12-08: qty 16

## 2018-12-08 MED ORDER — ACETAMINOPHEN 325 MG PO TABS: 650.0000 mg | ORAL_TABLET | ORAL | Status: DC | PRN

## 2018-12-08 MED ORDER — ZOLPIDEM TARTRATE 5 MG PO TABS
5.0000 mg | ORAL_TABLET | Freq: Once | ORAL | Status: AC
  Administered 2018-12-08: 5 mg via ORAL
  Filled 2018-12-08: qty 1

## 2018-12-08 MED ORDER — SODIUM CHLORIDE 0.9% FLUSH
3.0000 mL | Freq: Two times a day (BID) | INTRAVENOUS | Status: DC
  Administered 2018-12-09: 3 mL via INTRAVENOUS

## 2018-12-08 MED ORDER — SODIUM CHLORIDE 0.9 % IV SOLN: 250.0000 mL | INTRAVENOUS | Status: DC | PRN

## 2018-12-08 MED ORDER — HYDRALAZINE HCL 10 MG PO TABS: 10.0000 mg | ORAL_TABLET | Freq: Three times a day (TID) | ORAL | Status: DC

## 2018-12-08 MED ORDER — IOHEXOL 350 MG/ML SOLN
80.0000 mL | Freq: Once | INTRAVENOUS | Status: AC | PRN
  Administered 2018-12-08: 80 mL via INTRAVENOUS

## 2018-12-08 NOTE — H&P (Signed)
History and Physical    INFINITI MENAKER [id number] DOB: July 22, 1945 DOA: 12/08/2018  PCP: Raina Mina., MD Consultants:  [facility] - cardiology; [facility] - orthopedics Patient coming from:  Home - lives with Boaz; NOK: Husband, [phone]  Chief Complaint: chest pain  HPI: Tiffany Wiggins is a 73 y.o. female with medical history significant of TIA (2006); afib; HTN; HLD; CKD; and DM presenting with chest pain.   She got up and went to the bathroom overnight, has been urinating often.  She felt very tired yesterday throughout the day.  10-15 minutes after returning to bed, she developed mid-sternal CP.  She had worsening CP and so called 911.  Nothing seemed to make it worse.  It seems to have resolved spontaneously.  She does not think she received NTG.  Mild SOB.  No diaphoresis or nausea.  She takes ASA only for afib.   Negative MPS in 2018.  Her chest pain is currently resolved.  She reports that "you're scaring me" with most of the questions (NOK, resuscitation, HPI) and that she won't make any health care decisions including about cath without her husband.   ED Course:  Carryover, per Dr. Blaine Hamper:   73 year old lady with past medical history of hypertension, hyperlipidemia, diabetes mellitus, stroke, DVT, atrial fibrillation not on anticoagulants, who presents with chest pain. Found to have troponin 9--> 27. D-dimer positive at 1.99, CT angiogram is negative for PE. IV heparin was started. Cardiology, Dr. Georgette Shell was consulted. Pt is admitted to tele bed as inpt.   Review of Systems: As per HPI; otherwise review of systems reviewed and negative.   Ambulatory Status:  Ambulates without assistance  Past Medical History:  Diagnosis Date  . CVA (cerebral vascular accident) ([place]) 2019  . Diabetes mellitus type 2 in nonobese (HCC)   . Hyperlipidemia   . Hypertension    PCP GAVE FOR HER  BLOOD PRESSURE  . IBS (irritable bowel syndrome)   . PUD (peptic ulcer disease)   . TIA  (transient ischemic attack) 2006    Past Surgical History:  Procedure Laterality Date  . ABDOMINAL HYSTERECTOMY    . BIOPSY THYROID  Jan. 2016  . LAPAROSCOPY N/A 08/25/2015   Procedure: EXPLORATORY LAPAROTOMY, ILEOCECECTOMY, PRIMARY ANASTOMOSIS, LYSIS OF ADHESIONS;  Surgeon: Autumn Messing III, MD;  Location: [facility] ORS;  Service: General;  Laterality: N/A;  . laparoscopy for fertility work up    . LOOP RECORDER IMPLANT N/A 03/24/2014   Procedure: LOOP RECORDER IMPLANT;  Surgeon: Deboraha Sprang, MD;  Location: [facility] CATH LAB;  Service: Cardiovascular;  Laterality: N/A;  . LUMBAR LAMINECTOMY/DECOMPRESSION MICRODISCECTOMY  04/25/2011   Procedure: LUMBAR LAMINECTOMY/DECOMPRESSION MICRODISCECTOMY;  Surgeon: [name], MD;  Location: [place] NEURO ORS;  Service: Neurosurgery;  Laterality: Right;  Right Lumbar Four-Five Discectomy  . OOPHORECTOMY    . TONSILLECTOMY      Social History   Socioeconomic History  . Marital status: Married    Spouse name: Not on file  . Number of children: Not on file  . Years of education: Not on file  . Highest education level: Not on file  Occupational History  . Occupation: retired  Scientific laboratory technician  . Financial resource strain: Not on file  . Food insecurity    Worry: Not on file    Inability: Not on file  . Transportation needs    Medical: Not on file    Non-medical: Not on file  Tobacco Use  . Smoking status: Former Smoker  Types: Cigarettes    Quit date: 04/02/2004    Years since quitting: 14.6  . Smokeless tobacco: Never Used  Substance and Sexual Activity  . Alcohol use: Yes    Alcohol/week: 2.0 standard drinks    Types: 2 Glasses of wine per week  . Drug use: No  . Sexual activity: Not on file  Lifestyle  . Physical activity    Days per week: Not on file    Minutes per session: Not on file  . Stress: Not on file  Relationships  . Social Herbalist on phone: Not on file    Gets together: Not on file    Attends religious service: Not  on file    Active member of club or organization: Not on file    Attends meetings of clubs or organizations: Not on file    Relationship status: Not on file  . Intimate partner violence    Fear of current or ex partner: Not on file    Emotionally abused: Not on file    Physically abused: Not on file    Forced sexual activity: Not on file  Other Topics Concern  . Not on file  Social History Narrative  . Not on file    Allergies  Allergen Reactions  . Codeine Nausea Only  . Morphine And Related Itching    Itching     Family History  Problem Relation Age of Onset  . Cancer Sister        lung  . Diabetes Sister   . Cancer Brother        lung  . Colon cancer Paternal Uncle     Prior to Admission medications   Medication Sig Start Date End Date Taking? Authorizing Provider  amLODipine (NORVASC) 10 MG tablet TAKE 1 TABLET BY MOUTH ONCE DAILY Patient taking differently: Take 10 mg by mouth daily.  03/20/17  Yes Shelda Pal, DO  aspirin 81 MG chewable tablet Chew 81 mg by mouth daily.   Yes [provider]  Coenzyme Q10 (COQ10) 100 MG CAPS Take 1 tablet by mouth daily.   Yes [provider]  fluticasone (FLONASE) 50 MCG/ACT nasal spray Place 2 sprays into both nostrils daily. 09/07/16  Yes Shelda Pal, DO  lisinopril (PRINIVIL,ZESTRIL) 40 MG tablet TAKE 1 TABLET BY MOUTH ONCE DAILY Patient taking differently: Take 40 mg by mouth daily.  03/20/17  Yes Shelda Pal, DO  Multiple Vitamins-Minerals (VISION-VITE PRESERVE PO) Take 1 tablet by mouth 2 (two) times daily.   Yes [provider]  TURMERIC PO Take 1 tablet by mouth daily.   Yes [provider]  metFORMIN (GLUCOPHAGE) 500 MG tablet TAKE 1 TABLET BY MOUTH AT BEDTIME Patient not taking: Reported on 12/08/2018 02/25/17   Shelda Pal, DO  omeprazole (PRILOSEC) 20 MG capsule Take 1 capsule (20 mg total) by mouth daily. Patient not taking: Reported on  12/08/2018 09/14/16   Shelda Pal, DO  pravastatin (PRAVACHOL) 40 MG tablet TAKE 1 TABLET BY MOUTH ONCE DAILY Patient not taking: Reported on 12/08/2018 06/19/17   Shelda Pal, DO    Physical Exam: Vitals:   12/08/18 0718 12/08/18 0843 12/08/18 1045 12/08/18 1315  BP: (!) 180/72  (!) 170/58 (!) 138/119  Pulse: (!) 52     Resp: 14  (!) 21 19  Temp:      TempSrc:      SpO2: 99% 99%    Weight:  Height:         . General:  Appears calm and comfortable and is NAD . Eyes:  PERRL, EOMI, normal lids, iris . ENT:  grossly normal hearing, lips & tongue, mmm . Neck:  no LAD, masses or thyromegaly . Cardiovascular:  RR with bradycardia, no m/r/g. No LE edema.  Marland Kitchen Respiratory:   CTA bilaterally with no wheezes/rales/rhonchi.  Normal respiratory effort. . Abdomen:  soft, NT, ND, NABS . Skin:  no rash or induration seen on limited exam . Musculoskeletal:  grossly normal tone BUE/BLE, good ROM, no bony abnormality . Psychiatric:  anxious mood and affect, speech fluent and appropriate, AOx3 . Neurologic:  CN 2-12 grossly intact, moves all extremities in coordinated fashion, sensation intact    Radiological Exams on Admission: Ct Angio Chest Pe W And/or Wo Contrast  Result Date: 12/08/2018 CLINICAL DATA:  Initial evaluation for acute chest pain, positive D-dimer. EXAM: CT ANGIOGRAPHY CHEST WITH CONTRAST TECHNIQUE: Multidetector CT imaging of the chest was performed using the standard protocol during bolus administration of intravenous contrast. Multiplanar CT image reconstructions and MIPs were obtained to evaluate the vascular anatomy. CONTRAST:  67mL OMNIPAQUE IOHEXOL 350 MG/ML SOLN COMPARISON:  Prior radiograph from earlier same day. FINDINGS: Cardiovascular: Normal intravascular enhancement seen throughout the intra-abdominal aorta without evidence for aneurysm or other acute aortic pathology. Moderate aortic atherosclerosis noted. Partially visualized great vessels within  normal limits. Cardiomegaly. No pericardial effusion. Scattered 3 vessel coronary artery calcifications. Pulmonary arterial tree adequately opacified for evaluation. Main pulmonary artery measures within normal limits for caliber. No filling defect to suggest acute pulmonary embolism identified. Re-formatted imaging confirms these findings. Mediastinum/Nodes: Visualized thyroid within normal limits. No pathologically enlarged mediastinal, hilar, or axillary lymph nodes. Esophagus within normal limits. Lungs/Pleura: Tracheobronchial tree intact and patent. Lungs well inflated. Mild mosaic attenuation involving the lower lobes bilaterally likely reflects a degree of air trapping. No focal infiltrates. No pulmonary edema or pleural effusion. No pneumothorax. No worrisome pulmonary nodule or mass. Upper Abdomen: Visualized upper abdomen demonstrates no acute finding. Musculoskeletal: No acute osseous abnormality. No discrete lytic or blastic osseous lesions. Review of the MIP images confirms the above findings. IMPRESSION: 1. No CT evidence for acute pulmonary embolism. 2. No other acute cardiopulmonary abnormality identified. 3. Cardiomegaly with 3 vessel coronary artery calcifications and moderate aortic atherosclerosis. Electronically Signed   By: Jeannine Boga M.D.   On: 12/08/2018 05:27   Dg Chest Portable 1 View  Result Date: 12/08/2018 CLINICAL DATA:  Chest pain EXAM: PORTABLE CHEST 1 VIEW COMPARISON:  April 25, 2011 FINDINGS: The heart size and mediastinal contours are within normal limits. Aortic knob calcifications. Both lungs are clear. The visualized skeletal structures are unremarkable. IMPRESSION: No acute cardiopulmonary process. Electronically Signed   By: Prudencio Pair M.D.   On: 12/08/2018 02:51    EKG: Independently reviewed.  NSR with rate 89; IVCD; nonspecific ST changes with no evidence of acute ischemia   Labs on Admission: I have personally reviewed the available labs and imaging  studies at the time of the admission.  Pertinent labs:   Glucose 109 BUN 24/Creatinine 1.10/GFR 50 BNP 122.5 HS troponin 9 -> 27 -> 55 Unremarkable CBC D-dimer 1.99 UA WNL   Assessment/Plan Principal Problem:   NSTEMI (non-ST elevated myocardial infarction) ([place]) Active Problems:   Hyperlipemia   Essential hypertension   Diabetes mellitus type 2, controlled, with complications ([place])   Paroxysmal A-fib ([place])   NSTEMI -Patient with substernal chest pain that came on  acutely at rest, appears to have resolved spontaneously -1-2/3 typical symptoms suggestive of noncardiac vs. atypical chest pain.  -CXR unremarkable.   -Initial cardiac enzymes negative but troponin increased, concerning for NSTEMI  -EKG with non-specific ST changes without STEMI. -TIMI risk score is 5; which predicts a 14 days risk of death, recurrent MI, or urgent revascularization of 26.2%.  -Will admit since the patient has positive troponins with angina necessitating acute intervention. -Repeat EKG in AM -Continue ASA 81 mg daily -Risk factor stratification with HgbA1c and FLP; will also check TSH -Cardiology consultation -NPO after midnight for probable cath in AM -Will plan to start Heparin drip  -Start Lipitor 40 mg qhs for now  HTN -Continue Norvasc, lisinopril  HLD -Empirically start Lipitor 40 mg qhs -Check lipids  DM -Prior A1c 6.0 in 6/18; will recheck -She is diet controlled -Will cover with moderate-scale SSI  Afib -Prior h/o -Not on AC other than ASA -No beta-blocker due to bradycardia   Note: This patient has been tested and is negative for the novel coronavirus COVID-19.   DVT prophylaxis: Heparin drip Code Status:  Full - confirmed with patient Family Communication: None present at the time of the evaluation  Disposition Plan:  Home once clinically improved Consults called: Cardiology  Admission status: Admit - It is my clinical opinion that admission to INPATIENT is  reasonable and necessary because of the expectation that this patient will require hospital care that crosses at least 2 midnights to treat this condition based on the medical complexity of the problems presented.  Given the aforementioned information, the predictability of an adverse outcome is felt to be significant.    Karmen Bongo MD [facility]   How to contact the [facility] Attending or Consulting provider [place] or covering provider during after hours [place], for this patient?  1. Check the care team in [facility] and look for a) attending/consulting [facility] provider listed and b) the [facility] team listed 2. Log into [email or web address] and use [place]'s universal password to access. If you do not have the password, please contact the hospital operator. 3. Locate the [facility] provider you are looking for under [facility] and page to a number that you can be directly reached. 4. If you still have difficulty reaching the provider, please page the [facility] (Director on Call) for the Hospitalists listed on amion for assistance.   12/08/2018, 5:04 PM

## 2018-12-08 NOTE — Progress Notes (Signed)
  Echocardiogram 2D Echocardiogram has been performed.  Bobbye Charleston 12/08/2018, 3:30 PM

## 2018-12-08 NOTE — Care Management (Signed)
This is a no charge note  Pending admission per Dr. Wyvonnia Dusky  73 year old lady with past medical history of hypertension, hyperlipidemia, diabetes mellitus, stroke, DVT, atrial fibrillation not on anticoagulants, who presents with chest pain.  Found to have troponin 9--> 27.  D-dimer positive at 1.99, CT angiogram is negative for PE.  IV heparin was started. Cardiology, Dr. Georgette Shell was consulted. Pt is admitted to tele bed as inpt. Pending Covid 19.  Ivor Costa, MD  [facility]   If 7PM-7AM, please contact night-coverage [email or web address] Password [facility] 12/08/2018, 6:51 AM

## 2018-12-08 NOTE — Progress Notes (Signed)
ANTICOAGULATION CONSULT NOTE - Initial Consult  Pharmacy Consult for heparin Indication: atrial fibrillation  Allergies  Allergen Reactions  . Codeine Nausea Only  . Morphine And Related Itching    Itching     Patient Measurements: Height: 5\' 6"  (167.6 cm) Weight: 138 lb (62.6 kg) IBW/kg (Calculated) : 59.3 Heparin Dosing Weight: 62 kg  Vital Signs: Temp: 97.8 F (36.6 C) (09/07 0212) Temp Source: Oral (09/07 0212) BP: 158/53 (09/07 0400) Pulse Rate: 57 (09/07 0415)  Labs: Recent Labs    12/08/18 0222 12/08/18 0425  HGB 12.9  --   HCT 41.1  --   PLT 186  --   CREATININE 1.10*  --   TROPONINIHS 9 27*    Estimated Creatinine Clearance: 43.9 mL/min (A) (by C-G formula based on SCr of 1.1 mg/dL (H)).   Medical History: Past Medical History:  Diagnosis Date  . Diabetes mellitus type 2 in nonobese (HCC)   . Hyperlipidemia   . Hypertension    PCP GAVE FOR HER  BLOOD PRESSURE  . IBS (irritable bowel syndrome)   . PUD (peptic ulcer disease)   . Stroke [facility])    TIA  BACK IN  04/2004    Medications:  See medication history  Assessment: 73 yo lady with new onset afib to start heparin.  She was not on anticoagulation PTA Goal of Therapy:  Heparin level 0.3-0.7 units/ml Monitor platelets by anticoagulation protocol: Yes   Plan:  Heparin 3000 unit bolus and drip at 850 units/hr Check heparin level 6 hours after start. Daily HL and CBC Monitor for bleeding complications  Thanks for allowing pharmacy to be a part of this patient's care.  Excell Seltzer, PharmD Clinical Pharmacist 12/08/2018,6:09 AM

## 2018-12-08 NOTE — ED Notes (Signed)
ED TO INPATIENT HANDOFF REPORT  ED Nurse Name and Phone #: Kaydence Menard/Kayla  S Name/Age/Gender Tiffany Wiggins 73 y.o. female Room/Bed: 058C/058C  Code Status   Code Status: Full Code  Home/SNF/Other Home Patient oriented to: self, place, time and situation Is this baseline? Yes   Triage Complete: Triage complete  Chief Complaint chest pain; afib  Triage Note Pt came in EMS with the c/o of chest pain. The chest pain did not wake her up, but upon going to the bathroom, she felt centralized chest pain. This occurred around 0100 this morning.  EKG showed Afib. BP:184/96   Allergies Allergies  Allergen Reactions  . Codeine Nausea Only  . Morphine And Related Itching    Itching     Level of Care/Admitting Diagnosis ED Disposition    ED Disposition Condition Comment   Admit  Hospital Area: [facility] [[id number]]  Level of Care: Progressive [102]  Covid Evaluation: Confirmed COVID Negative  Diagnosis: NSTEMI (non-ST elevated myocardial infarction) [id number]  Admitting Physician: Ivor Costa [[id number]]  Attending Physician: Ivor Costa [phone]  Estimated length of stay: past midnight tomorrow  Certification:: I certify this patient will need inpatient services for at least 2 midnights  PT Class (Do Not Modify): Inpatient [101]  PT Acc Code (Do Not Modify): Private [1]       B Medical/Surgery History Past Medical History:  Diagnosis Date  . CVA (cerebral vascular accident) ([facility]) 2019  . Diabetes mellitus type 2 in nonobese (HCC)   . Hyperlipidemia   . Hypertension    PCP GAVE FOR HER  BLOOD PRESSURE  . IBS (irritable bowel syndrome)   . PUD (peptic ulcer disease)   . TIA (transient ischemic attack) 2006   Past Surgical History:  Procedure Laterality Date  . ABDOMINAL HYSTERECTOMY    . BIOPSY THYROID  Jan. 2016  . LAPAROSCOPY N/A 08/25/2015   Procedure: EXPLORATORY LAPAROTOMY, ILEOCECECTOMY, PRIMARY ANASTOMOSIS, LYSIS OF ADHESIONS;  Surgeon: Autumn Messing  III, MD;  Location: [facility];  Service: General;  Laterality: N/A;  . laparoscopy for fertility work up    . LOOP RECORDER IMPLANT N/A 03/24/2014   Procedure: LOOP RECORDER IMPLANT;  Surgeon: Deboraha Sprang, MD;  Location: [facility] CATH LAB;  Service: Cardiovascular;  Laterality: N/A;  . LUMBAR LAMINECTOMY/DECOMPRESSION MICRODISCECTOMY  04/25/2011   Procedure: LUMBAR LAMINECTOMY/DECOMPRESSION MICRODISCECTOMY;  Surgeon: [name], MD;  Location: [facility]URO ORS;  Service: Neurosurgery;  Laterality: Right;  Right Lumbar Four-Five Discectomy  . OOPHORECTOMY    . TONSILLECTOMY       A IV Location/Drains/Wounds Patient Lines/Drains/Airways Status   Active Line/Drains/Airways    Name:   Placement date:   Placement time:   Site:   Days:   Peripheral IV 12/08/18 Right Antecubital   12/08/18    0218    Antecubital   less than 1   Negative Pressure Wound Therapy Abdomen Medial   -    -    -      Incision 04/25/11 Back Bilateral   04/25/11    0921     2784   Incision (Closed) 08/25/15 Abdomen Other (Comment)   08/25/15    2143     1201   Incision - 1 Port Abdomen 1: Upper;Left   08/25/15    -     1201          Intake/Output Last 24 hours No intake or output data in the 24 hours ending 12/08/18 1326  Labs/Imaging Results  for orders placed or performed during the hospital encounter of 12/08/18 (from the past 48 hour(s))  CBC with Differential/Platelet     Status: Abnormal   Collection Time: 12/08/18  2:22 AM  Result Value Ref Range   WBC 8.8 4.0 - 10.5 K/uL   RBC 3.94 3.87 - 5.11 MIL/uL   Hemoglobin 12.9 12.0 - 15.0 g/dL   HCT 41.1 36.0 - 46.0 %   MCV 104.3 (H) 80.0 - 100.0 fL   MCH 32.7 26.0 - 34.0 pg   MCHC 31.4 30.0 - 36.0 g/dL   RDW 12.1 11.5 - 15.5 %   Platelets 186 150 - 400 K/uL   nRBC 0.0 0.0 - 0.2 %   Neutrophils Relative % 51 %   Neutro Abs 4.5 1.7 - 7.7 K/uL   Lymphocytes Relative 39 %   Lymphs Abs 3.5 0.7 - 4.0 K/uL   Monocytes Relative 6 %   Monocytes Absolute 0.6 0.1 - 1.0  K/uL   Eosinophils Relative 3 %   Eosinophils Absolute 0.2 0.0 - 0.5 K/uL   Basophils Relative 1 %   Basophils Absolute 0.0 0.0 - 0.1 K/uL   Immature Granulocytes 0 %   Abs Immature Granulocytes 0.02 0.00 - 0.07 K/uL    Comment: Performed at [facility], [address] [place], [place] [address]  Comprehensive metabolic panel     Status: Abnormal   Collection Time: 12/08/18  2:22 AM  Result Value Ref Range   Sodium 144 135 - 145 mmol/L   Potassium 4.2 3.5 - 5.1 mmol/L   Chloride 108 98 - 111 mmol/L   CO2 24 22 - 32 mmol/L   Glucose, Bld 109 (H) 70 - 99 mg/dL   BUN 24 (H) 8 - 23 mg/dL   Creatinine, Ser 1.10 (H) 0.44 - 1.00 mg/dL   Calcium 9.7 8.9 - 10.3 mg/dL   Total Protein 6.4 (L) 6.5 - 8.1 g/dL   Albumin 3.8 3.5 - 5.0 g/dL   AST 21 15 - 41 U/L   ALT 16 0 - 44 U/L   Alkaline Phosphatase 54 38 - 126 U/L   Total Bilirubin 0.4 0.3 - 1.2 mg/dL   GFR calc non Af Amer 50 (L) >60 mL/min   GFR calc Af Amer 58 (L) >60 mL/min   Anion gap 12 5 - 15    Comment: Performed at [facility] [address], [place], [place] [address]  Troponin I (High Sensitivity)     Status: None   Collection Time: 12/08/18  2:22 AM  Result Value Ref Range   Troponin I (High Sensitivity) 9 <18 ng/L    Comment: (NOTE) Elevated high sensitivity troponin I (hsTnI) values and significant  changes across serial measurements may suggest ACS but many other  chronic and acute conditions are known to elevate hsTnI results.  Refer to the "Links" section for chest pain algorithms and additional  guidance. Performed at [facility], [place] [address], [place], [place] [address]   D-dimer, quantitative (not at [facility])     Status: Abnormal   Collection Time: 12/08/18  2:22 AM  Result Value Ref Range   D-Dimer, Quant 1.99 (H) 0.00 - 0.50 ug/mL-FEU    Comment: (NOTE) At the manufacturer cut-off of 0.50 ug/mL FEU, this assay has been documented to exclude PE with a sensitivity and negative  predictive value of 97 to 99%.  At this time, this assay has not been approved by the FDA to exclude  DVT/VTE. Results should be correlated with clinical presentation. Performed at [facility], [place] [address], [place], [place] [address]   Brain natriuretic peptide     Status: Abnormal   Collection Time: 12/08/18  2:27 AM  Result Value Ref Range   B Natriuretic Peptide 122.5 (H) 0.0 - 100.0 pg/mL    Comment: Performed at [facility] [address] [place], [place] [address]  Urinalysis, Routine w reflex microscopic     Status: Abnormal   Collection Time: 12/08/18  2:55 AM  Result Value Ref Range   Color, Urine COLORLESS (A) YELLOW   APPearance CLEAR CLEAR   Specific Gravity, Urine 1.005 1.005 - 1.030   pH 7.0 5.0 - 8.0   Glucose, UA NEGATIVE NEGATIVE mg/dL   Hgb urine dipstick NEGATIVE NEGATIVE   Bilirubin Urine NEGATIVE NEGATIVE   Ketones, ur NEGATIVE NEGATIVE mg/dL   Protein, ur NEGATIVE NEGATIVE mg/dL   Nitrite NEGATIVE NEGATIVE   Leukocytes,Ua NEGATIVE NEGATIVE    Comment: Performed at [facility] [address], [place], [place] [address]  Troponin I (High Sensitivity)     Status: Abnormal   Collection Time: 12/08/18  4:25 AM  Result Value Ref Range   Troponin I (High Sensitivity) 27 (H) <18 ng/L    Comment: (NOTE) Elevated high sensitivity troponin I (hsTnI) values and significant  changes across serial measurements may suggest ACS but many other  chronic and acute conditions are known to elevate hsTnI results.  Refer to the "Links" section for chest pain algorithms and additional  guidance. Performed at [facility], [place] [address] [place], [place] [address]   SARS Coronavirus 2 [facility] order, Performed in [facility] hospital lab) Nasopharyngeal Nasopharyngeal Swab     Status: None   Collection Time: 12/08/18  5:33 AM   Specimen: Nasopharyngeal Swab  Result Value Ref Range   SARS Coronavirus 2 NEGATIVE NEGATIVE    Comment: (NOTE) If  result is NEGATIVE SARS-CoV-2 target nucleic acids are NOT DETECTED. The SARS-CoV-2 RNA is generally detectable in upper and lower  respiratory specimens during the acute phase of infection. The lowest  concentration of SARS-CoV-2 viral copies this assay can detect is 250  copies / mL. A negative result does not preclude SARS-CoV-2 infection  and should not be used as the sole basis for treatment or other  patient management decisions.  A negative result may occur with  improper specimen collection / handling, submission of specimen other  than nasopharyngeal swab, presence of viral mutation(s) within the  areas targeted by this assay, and inadequate number of viral copies  (<250 copies / mL). A negative result must be combined with clinical  observations, patient history, and epidemiological information. If result is POSITIVE SARS-CoV-2 target nucleic acids are DETECTED. The SARS-CoV-2 RNA is generally detectable in upper and lower  respiratory specimens dur ing the acute phase of infection.  Positive  results are indicative of active infection with SARS-CoV-2.  Clinical  correlation with patient history and other diagnostic information is  necessary to determine patient infection status.  Positive results do  not rule out bacterial infection or co-infection with other viruses. If result is PRESUMPTIVE POSTIVE SARS-CoV-2 nucleic acids MAY BE PRESENT.   A presumptive positive result was obtained on the submitted specimen  and confirmed on repeat testing.  While 2019 novel coronavirus  (SARS-CoV-2) nucleic acids may be present in the submitted sample  additional confirmatory testing may be necessary for epidemiological  and / or clinical management purposes  to differentiate between  SARS-CoV-2 and other Sarbecovirus currently known to infect humans.  If clinically indicated additional testing with an alternate test  methodology [phone]) is advised. The SARS-CoV-2 RNA is generally   detectable in upper and lower respiratory sp ecimens during the acute  phase of infection. The expected result is Negative. Fact Sheet for Patients:  StrictlyIdeas.no Fact Sheet for Healthcare Providers: BankingDealers.co.za This test is not yet approved or cleared by the [place] FDA and has been authorized for detection and/or diagnosis of SARS-CoV-2 by FDA under an Emergency Use Authorization (EUA).  This EUA will remain in effect (meaning this test can be used) for the duration of the COVID-19 declaration under Section 564(b)(1) of the Act, 21 U.S.C. section 360bbb-3(b)(1), unless the authorization is terminated or revoked sooner. Performed at [facility], [place] [address] [place], [place] [address]   TSH     Status: None   Collection Time: 12/08/18  9:13 AM  Result Value Ref Range   TSH 1.717 0.350 - 4.500 uIU/mL    Comment: Performed by a 3rd Generation assay with a functional sensitivity of <=0.01 uIU/mL. Performed at [facility], [place] [address] [place], [place] [address]   Hemoglobin A1c     Status: Abnormal   Collection Time: 12/08/18  9:13 AM  Result Value Ref Range   Hgb A1c MFr Bld 5.8 (H) 4.8 - 5.6 %    Comment: (NOTE) Pre diabetes:          5.7%-6.4% Diabetes:              >6.4% Glycemic control for   <7.0% adults with diabetes    Mean Plasma Glucose 119.76 mg/dL    Comment: Performed at [facility] [address] [place], [place] [address]  Troponin I (High Sensitivity)     Status: Abnormal   Collection Time: 12/08/18  9:13 AM  Result Value Ref Range   Troponin I (High Sensitivity) 55 (H) <18 ng/L    Comment: DELTA CHECK NOTED RESULT CALLED TO, READ BACK BY AND VERIFIED WITH: E ATKINS RN [id number] BY A BENNETT (NOTE) Elevated high sensitivity troponin I (hsTnI) values and significant  changes across serial measurements may suggest ACS but many other  chronic and acute  conditions are known to elevate hsTnI results.  Refer to the Links section for chest pain algorithms and additional  guidance. Performed at [facility], [place] [address], [place], [place] [address]   Lipid panel     Status: Abnormal   Collection Time: 12/08/18  9:13 AM  Result Value Ref Range   Cholesterol 289 (H) 0 - 200 mg/dL   Triglycerides 117 <150 mg/dL   HDL 56 >40 mg/dL   Total CHOL/HDL Ratio 5.2 RATIO   VLDL 23 0 - 40 mg/dL   LDL Cholesterol 210 (H) 0 - 99 mg/dL    Comment:        Total Cholesterol/HDL:CHD Risk Coronary Heart Disease Risk Table                     Men   Women  1/2 Average Risk   3.4   3.3  Average Risk       5.0   4.4  2 X Average Risk   9.6   7.1  3 X Average Risk  23.4   11.0        Use the  calculated Patient Ratio above and the CHD Risk Table to determine the patient's CHD Risk.        ATP III CLASSIFICATION (LDL):  <100     mg/dL   Optimal  100-129  mg/dL   Near or Above                    Optimal  130-159  mg/dL   Borderline  160-189  mg/dL   High  >190     mg/dL   Very High Performed at [facility] [address], [place], [place] [address]   Troponin I (High Sensitivity)     Status: Abnormal   Collection Time: 12/08/18 12:03 PM  Result Value Ref Range   Troponin I (High Sensitivity) 52 (H) <18 ng/L    Comment: (NOTE) Elevated high sensitivity troponin I (hsTnI) values and significant  changes across serial measurements may suggest ACS but many other  chronic and acute conditions are known to elevate hsTnI results.  Refer to the "Links" section for chest pain algorithms and additional  guidance. Performed at [facility], [place] [address] [place],  [address]   CBG monitoring, ED     Status: None   Collection Time: 12/08/18 12:06 PM  Result Value Ref Range   Glucose-Capillary 95 70 - 99 mg/dL   Ct Angio Chest Pe W And/or Wo Contrast  Result Date: 12/08/2018 CLINICAL DATA:  Initial evaluation for acute chest  pain, positive D-dimer. EXAM: CT ANGIOGRAPHY CHEST WITH CONTRAST TECHNIQUE: Multidetector CT imaging of the chest was performed using the standard protocol during bolus administration of intravenous contrast. Multiplanar CT image reconstructions and MIPs were obtained to evaluate the vascular anatomy. CONTRAST:  6mL OMNIPAQUE IOHEXOL 350 MG/ML SOLN COMPARISON:  Prior radiograph from earlier same day. FINDINGS: Cardiovascular: Normal intravascular enhancement seen throughout the intra-abdominal aorta without evidence for aneurysm or other acute aortic pathology. Moderate aortic atherosclerosis noted. Partially visualized great vessels within normal limits. Cardiomegaly. No pericardial effusion. Scattered 3 vessel coronary artery calcifications. Pulmonary arterial tree adequately opacified for evaluation. Main pulmonary artery measures within normal limits for caliber. No filling defect to suggest acute pulmonary embolism identified. Re-formatted imaging confirms these findings. Mediastinum/Nodes: Visualized thyroid within normal limits. No pathologically enlarged mediastinal, hilar, or axillary lymph nodes. Esophagus within normal limits. Lungs/Pleura: Tracheobronchial tree intact and patent. Lungs well inflated. Mild mosaic attenuation involving the lower lobes bilaterally likely reflects a degree of air trapping. No focal infiltrates. No pulmonary edema or pleural effusion. No pneumothorax. No worrisome pulmonary nodule or mass. Upper Abdomen: Visualized upper abdomen demonstrates no acute finding. Musculoskeletal: No acute osseous abnormality. No discrete lytic or blastic osseous lesions. Review of the MIP images confirms the above findings. IMPRESSION: 1. No CT evidence for acute pulmonary embolism. 2. No other acute cardiopulmonary abnormality identified. 3. Cardiomegaly with 3 vessel coronary artery calcifications and moderate aortic atherosclerosis. Electronically Signed   By: Jeannine Boga M.D.   On:  12/08/2018 05:27   Dg Chest Portable 1 View  Result Date: 12/08/2018 CLINICAL DATA:  Chest pain EXAM: PORTABLE CHEST 1 VIEW COMPARISON:  April 25, 2011 FINDINGS: The heart size and mediastinal contours are within normal limits. Aortic knob calcifications. Both lungs are clear. The visualized skeletal structures are unremarkable. IMPRESSION: No acute cardiopulmonary process. Electronically Signed   By: Prudencio Pair M.D.   On: 12/08/2018 02:51    Pending Labs [employer] (From admission, onward)    Start  Ordered   12/09/18 0500  Heparin level (unfractionated)  Daily,   R     12/08/18 0608   12/09/18 0500  CBC  Daily,   R     12/08/18 0608   12/09/18 [id number]  Basic metabolic panel  Tomorrow morning,   R     12/08/18 0844   12/08/18 1300  Heparin level (unfractionated)  Once-Timed,   STAT     12/08/18 0608          Vitals/Pain Today's Vitals   12/08/18 1045 12/08/18 1132 12/08/18 1230 12/08/18 1315  BP: (!) 170/58   (!) 138/119  Pulse:      Resp: (!) 21   19  Temp:      TempSrc:      SpO2:      Weight:      Height:      PainSc:  0-No pain 0-No pain     Isolation Precautions No active isolations  Medications Medications  heparin ADULT infusion 100 units/mL (25000 units/23mL sodium chloride 0.45%) (850 Units/hr Intravenous New Bag/Given 12/08/18 0627)  aspirin chewable tablet 81 mg (81 mg Oral Given 12/08/18 0951)  amLODipine (NORVASC) tablet 10 mg (10 mg Oral Given 12/08/18 0951)  fluticasone (FLONASE) 50 MCG/ACT nasal spray 2 spray (2 sprays Each Nare Not Given 12/08/18 1216)  insulin aspart (novoLOG) injection 0-15 Units (0 Units Subcutaneous Not Given 12/08/18 1207)  sodium chloride flush (NS) 0.9 % injection 3 mL (3 mLs Intravenous Not Given 12/08/18 0946)  sodium chloride flush (NS) 0.9 % injection 3 mL (has no administration in time range)  0.9 %  sodium chloride infusion (has no administration in time range)  nitroGLYCERIN (NITROSTAT) SL tablet 0.4 mg (has no  administration in time range)  atorvastatin (LIPITOR) tablet 40 mg (has no administration in time range)  acetaminophen (TYLENOL) tablet 650 mg (has no administration in time range)  ondansetron (ZOFRAN) injection 4 mg (has no administration in time range)  insulin aspart (novoLOG) injection 0-5 Units (has no administration in time range)  losartan (COZAAR) tablet 25 mg (25 mg Oral Given 12/08/18 1203)  aspirin chewable tablet 324 mg (324 mg Oral Given 12/08/18 0306)  iohexol (OMNIPAQUE) 350 MG/ML injection 80 mL (80 mLs Intravenous Contrast Given 12/08/18 0441)  heparin bolus via infusion 3,000 Units (3,000 Units Intravenous Bolus from Bag 12/08/18 0626)    Mobility walks Low fall risk   Focused Assessments Cardiac Assessment Handoff:  Cardiac Rhythm: Normal sinus rhythm No results found for: CKTOTAL, CKMB, CKMBINDEX, TROPONINI Lab Results  Component Value Date   DDIMER 1.99 (H) 12/08/2018   Does the Patient currently have chest pain? No     R Recommendations: See Admitting Provider Note  Report given to:   Additional Notes:

## 2018-12-08 NOTE — ED Notes (Signed)
Patient NPO at this time no Diet was ordered for Breakfast.

## 2018-12-08 NOTE — Consult Note (Signed)
Cardiology Consultation:   Patient ID: Tiffany Wiggins; [id number]; 03-04-1946   Admit date: 12/08/2018 Date of Consult: 12/08/2018  Primary Care Provider: Raina Mina., MD Primary Cardiologist: New to general cardiology  Primary Electrophysiologist: Dr. Virl Axe, MD  Patient Profile:   Tiffany Wiggins is a 73 y.o. female with a hx of atrial fibrillation (2018) with prior cryptogenic stroke s/p loop implantation (2015) on chronic AC, DM2, HLD, HTN and PUD who is being seen today for the evaluation of chest pain and atrial fibrillation with RVR at the request of Dr. Lorin Mercy.  History of Present Illness:   Ms. Tiffany Wiggins is a 73yo F with a hx as stated above who presented to [facility] on 12/08/2018 with an episode of chest pain. She reported chest pain that began overnight after getting up to go to the bathroom. The episode lasted approximately 10-15 in duration and dissipated on its on. She had no associated symptoms such as SOB, LEE, palpitations, orthopnea, dizziness, syncope, N/V or diaphoresis. She states that the pain resolved on its own. Given her symptoms, she called EMS for further evaluation. On their arrival, pt found to be in AF with RVR with a rate on 121bpm. She then converted spontaneously to NSR and is now in NSR/SB with rates in the 50-60's.   On ED presentation, HsT found to be 9>>>27. EKG with no acute changes, similar to prior tracings. D-dimer was found to be 1.99 therefore a CTA was performed that was negative for PE. There was moderate atherosclerosis and scattered 3 vessel CAD noted on CT. CXR with no acute process. BNP stable at 122. Creatinine was 1.24>1.10. Overnight cardiology coverage was curb-sided with recommendations for IV Hep gtt per pharmacy.   She was last seen by our service 03/01/2017 by Dr. Caryl Comes in follow up. At that time, she was noted to under a great deal of emotional stress. She underwent a cardiac cath in 2015 that showed non-obstructive disease. She had a  Myoview stress test 07/2016 with no ischemia. LVEF at that time was noted to be 60%. She has not been seen since that time. She has a known hx of cryptogenic stroke with documented AF on loop recorder. It is unclear as to when her [facility] was discontinued as patient is a[poor historian on this matter. On last OV with Dr. Caryl Comes she was noted to be on Eliquis.    Past Medical History:  Diagnosis Date   Diabetes mellitus type 2 in nonobese ([place])    Hyperlipidemia    Hypertension    PCP GAVE FOR HER  BLOOD PRESSURE   IBS (irritable bowel syndrome)    PUD (peptic ulcer disease)    TIA (transient ischemic attack) 2006    Past Surgical History:  Procedure Laterality Date   ABDOMINAL HYSTERECTOMY     BIOPSY THYROID  Jan. 2016   LAPAROSCOPY N/A 08/25/2015   Procedure: EXPLORATORY LAPAROTOMY, ILEOCECECTOMY, PRIMARY ANASTOMOSIS, LYSIS OF ADHESIONS;  Surgeon: Autumn Messing III, MD;  Location: [facility] ORS;  Service: General;  Laterality: N/A;   laparoscopy for fertility work up     [place] N/A 03/24/2014   Procedure: LOOP RECORDER IMPLANT;  Surgeon: Deboraha Sprang, MD;  Location: [facility] CATH LAB;  Service: Cardiovascular;  Laterality: N/A;   LUMBAR LAMINECTOMY/DECOMPRESSION MICRODISCECTOMY  04/25/2011   Procedure: LUMBAR LAMINECTOMY/DECOMPRESSION MICRODISCECTOMY;  Surgeon: [name], MD;  Location: [place] [facility]UR[facility] ORS;  Service: Neurosurgery;  Laterality: Right;  Right Lumbar Four-Five Discectomy   OOPHORECTOMY  TONSILLECTOMY       Prior to Admission medications   Medication Sig Start Date End Date Taking? Authorizing Provider  amLODipine (NORVASC) 10 MG tablet TAKE 1 TABLET BY MOUTH ONCE DAILY Patient taking differently: Take 10 mg by mouth daily.  03/20/17  Yes Shelda Pal, DO  aspirin 81 MG chewable tablet Chew 81 mg by mouth daily.   Yes [provider]  Coenzyme Q10 (COQ10) 100 MG CAPS Take 1 tablet by mouth daily.   Yes [provider]  fluticasone  (FLONASE) 50 MCG/ACT nasal spray Place 2 sprays into both nostrils daily. 09/07/16  Yes Shelda Pal, DO  lisinopril (PRINIVIL,ZESTRIL) 40 MG tablet TAKE 1 TABLET BY MOUTH ONCE DAILY Patient taking differently: Take 40 mg by mouth daily.  03/20/17  Yes Shelda Pal, DO  Multiple Vitamins-Minerals (VISION-VITE PRESERVE PO) Take 1 tablet by mouth 2 (two) times daily.   Yes [provider]  TURMERIC PO Take 1 tablet by mouth daily.   Yes [provider]  metFORMIN (GLUCOPHAGE) 500 MG tablet TAKE 1 TABLET BY MOUTH AT BEDTIME Patient not taking: Reported on 12/08/2018 02/25/17   Shelda Pal, DO  omeprazole (PRILOSEC) 20 MG capsule Take 1 capsule (20 mg total) by mouth daily. Patient not taking: Reported on 12/08/2018 09/14/16   Shelda Pal, DO  pravastatin (PRAVACHOL) 40 MG tablet TAKE 1 TABLET BY MOUTH ONCE DAILY Patient not taking: Reported on 12/08/2018 06/19/17   Shelda Pal, DO    Inpatient Medications: Scheduled Meds:  Continuous Infusions:  heparin 850 Units/hr (12/08/18 0627)   PRN Meds:   Allergies:    Allergies  Allergen Reactions   Codeine Nausea Only   Morphine And Related Itching    Itching     Social History:   Social History   Socioeconomic History   Marital status: Married    Spouse name: Not on file   Number of children: Not on file   Years of education: Not on file   Highest education level: Not on file  Occupational History   Not on file  Social Needs   Financial resource strain: Not on file   Food insecurity    Worry: Not on file    Inability: Not on file   Transportation needs    Medical: Not on file    Non-medical: Not on file  Tobacco Use   Smoking status: Former Smoker    Types: Cigarettes    Quit date: 04/02/2004    Years since quitting: 14.6   Smokeless tobacco: Never Used  Substance and Sexual Activity   Alcohol use: Yes    Alcohol/week: 2.0 standard drinks     Types: 2 Glasses of wine per week   Drug use: No   Sexual activity: Not on file  Lifestyle   Physical activity    Days per week: Not on file    Minutes per session: Not on file   Stress: Not on file  Relationships   Social connections    Talks on phone: Not on file    Gets together: Not on file    Attends religious service: Not on file    Active member of club or organization: Not on file    Attends meetings of clubs or organizations: Not on file    Relationship status: Not on file   Intimate partner violence    Fear of current or ex partner: Not on file    Emotionally abused: Not on file  Physically abused: Not on file    Forced sexual activity: Not on file  Other Topics Concern   Not on file  Social History Narrative   Not on file    Family History:   Family History  Problem Relation Age of Onset   Cancer Sister        lung   Diabetes Sister    Cancer Brother        lung   Colon cancer Paternal Uncle    Family Status:  Family Status  Relation Name Status   Mother  Deceased at age 45       MVA   Father  Deceased at age 54       MVA   Sister  (Not Specified)   Brother  (Not Specified)   Psychiatrist  (Not Specified)   MGM  Deceased   MGF  Deceased   PGM  Deceased   PGF  Deceased    ROS:  Please see the history of present illness.  All other ROS reviewed and negative.     Physical Exam/Data:   Vitals:   12/08/18 0600 12/08/18 0615 12/08/18 0630 12/08/18 0718  BP:    (!) 180/72  Pulse: (!) 50 (!) 51 (!) 49 (!) 52  Resp: 18 (!) 22 17 14   Temp:      TempSrc:      SpO2: 98% 99% 98% 99%  Weight:      Height:       No intake or output data in the 24 hours ending 12/08/18 0754 Filed Weights   12/08/18 0213  Weight: 62.6 kg   Body mass index is 22.27 kg/m.   General: Well developed, well nourished, NAD Neck: Negative for carotid bruits. No JVD Lungs:Clear to ausculation bilaterally. No wheezes, rales, or rhonchi. Breathing is  unlabored. Cardiovascular: RRR with S1 S2. No murmur Abdomen: Soft, non-tender, non-distended. No obvious abdominal masses. Extremities: No edema. No clubbing or cyanosis. DP/PT pulses 2+ bilaterally Neuro: Alert and oriented. No focal deficits. No facial asymmetry. MAE spontaneously. Psych: Responds to questions appropriately with normal affect.     EKG:  The EKG was personally reviewed and demonstrates: 12/08/2018 NSR with no acute changes from prior tracings, HR 89 Telemetry:  Telemetry was personally reviewed and demonstrates:  12/08/2018 SB HR 50-60's   Relevant CV Studies:  Stress test 07/25/2016:   Nuclear stress EF: 71%.  There was no ST segment deviation noted during stress.  This is a low risk study.  The left ventricular ejection fraction is hyperdynamic (>65%).   1. Fixed medium-sized, mild mid to apical anteroseptal perfusion defect.  No evidence for ischemia.  Given normal wall motion, this may be attenuation.  2. Normal LV systolic function and wall motion.  3. Low risk study.   Echocardiogram 07/25/2016:  Study Conclusions  - Left ventricle: The cavity size was normal. Systolic function was   normal. The estimated ejection fraction was in the range of 55%   to 60%. Wall motion was normal; there were no regional wall   motion abnormalities. Left ventricular diastolic function   parameters were normal. - Pulmonary arteries: Systolic pressure was mildly increased. PA   peak pressure: 32 mm Hg (S).   LHC 11/04/2013: in Epic  Non-obstructive disease   Laboratory Data:  Chemistry Recent Labs  Lab 12/08/18 0222  NA 144  K 4.2  CL 108  CO2 24  GLUCOSE 109*  BUN 24*  CREATININE 1.10*  CALCIUM 9.7  GFRNONAA 50*  GFRAA 58*  ANIONGAP 12    Total Protein  Date Value Ref Range Status  12/08/2018 6.4 (L) 6.5 - 8.1 g/dL Final   Albumin  Date Value Ref Range Status  12/08/2018 3.8 3.5 - 5.0 g/dL Final   AST  Date Value Ref Range Status   12/08/2018 21 15 - 41 U/L Final   ALT  Date Value Ref Range Status  12/08/2018 16 0 - 44 U/L Final   Alkaline Phosphatase  Date Value Ref Range Status  12/08/2018 54 38 - 126 U/L Final   Total Bilirubin  Date Value Ref Range Status  12/08/2018 0.4 0.3 - 1.2 mg/dL Final   Hematology Recent Labs  Lab 12/08/18 0222  WBC 8.8  RBC 3.94  HGB 12.9  HCT 41.1  MCV 104.3*  MCH 32.7  MCHC 31.4  RDW 12.1  PLT 186   Cardiac EnzymesNo results for input(s): TROPONINI in the last 168 hours. No results for input(s): TROPIPOC in the last 168 hours.  BNP Recent Labs  Lab 12/08/18 0227  BNP 122.5*    DDimer  Recent Labs  Lab 12/08/18 0222  DDIMER 1.99*   TSH:  Lab Results  Component Value Date   TSH 0.83 09/07/2016   Lipids: Lab Results  Component Value Date   CHOL 258 (H) 07/19/2015   HDL 54.30 07/19/2015   LDLDIRECT 167.0 07/19/2015   TRIG 274.0 (H) 07/19/2015   CHOLHDL 5 07/19/2015   HgbA1c: Lab Results  Component Value Date   HGBA1C 6.0 (H) 09/07/2016    Radiology/Studies:  Ct Angio Chest Pe W And/or Wo Contrast  Result Date: 12/08/2018 CLINICAL DATA:  Initial evaluation for acute chest pain, positive D-dimer. EXAM: CT ANGIOGRAPHY CHEST WITH CONTRAST TECHNIQUE: Multidetector CT imaging of the chest was performed using the standard protocol during bolus administration of intravenous contrast. Multiplanar CT image reconstructions and MIPs were obtained to evaluate the vascular anatomy. CONTRAST:  6mL OMNIPAQUE IOHEXOL 350 MG/ML SOLN COMPARISON:  Prior radiograph from earlier same day. FINDINGS: Cardiovascular: Normal intravascular enhancement seen throughout the intra-abdominal aorta without evidence for aneurysm or other acute aortic pathology. Moderate aortic atherosclerosis noted. Partially visualized great vessels within normal limits. Cardiomegaly. No pericardial effusion. Scattered 3 vessel coronary artery calcifications. Pulmonary arterial tree adequately  opacified for evaluation. Main pulmonary artery measures within normal limits for caliber. No filling defect to suggest acute pulmonary embolism identified. Re-formatted imaging confirms these findings. Mediastinum/Nodes: Visualized thyroid within normal limits. No pathologically enlarged mediastinal, hilar, or axillary lymph nodes. Esophagus within normal limits. Lungs/Pleura: Tracheobronchial tree intact and patent. Lungs well inflated. Mild mosaic attenuation involving the lower lobes bilaterally likely reflects a degree of air trapping. No focal infiltrates. No pulmonary edema or pleural effusion. No pneumothorax. No worrisome pulmonary nodule or mass. Upper Abdomen: Visualized upper abdomen demonstrates no acute finding. Musculoskeletal: No acute osseous abnormality. No discrete lytic or blastic osseous lesions. Review of the MIP images confirms the above findings. IMPRESSION: 1. No CT evidence for acute pulmonary embolism. 2. No other acute cardiopulmonary abnormality identified. 3. Cardiomegaly with 3 vessel coronary artery calcifications and moderate aortic atherosclerosis. Electronically Signed   By: Jeannine Boga M.D.   On: 12/08/2018 05:27   Dg Chest Portable 1 View  Result Date: 12/08/2018 CLINICAL DATA:  Chest pain EXAM: PORTABLE CHEST 1 VIEW COMPARISON:  April 25, 2011 FINDINGS: The heart size and mediastinal contours are within normal limits. Aortic knob calcifications. Both lungs are clear. The visualized skeletal structures are  unremarkable. IMPRESSION: No acute cardiopulmonary process. Electronically Signed   By: Prudencio Pair M.D.   On: 12/08/2018 02:51    Assessment and Plan:   1.Chest pain in the setting of AF with RVR: -Pt presented with an episode of chest pain that began overnight after getting up to go to the bathroom. The episode lasted approximately 10-15 in duration and dissipated on its on. She had no associated symptoms such as SOB, LEE, palpitations, orthopnea,  dizziness, syncope, N/V or diaphoresis. She states that the pain resolved on its own. Given her symptoms, she called EMS for further evaluation. On their arrival, pt found to be in AF with RVR with a rate on 121bpm. She then converted spontaneously to NSR and is now in NSR/SB with rates in the 50-60's.  -EKG reassuring with no acute changes -HsT 9>>27 -BP elevated with SBP in the 160-170 on arrival -IV Hep gtt per pharmacy>>will continue until after echocardiogram performed. Chest pain and mild trop elevation likely in the setting of AF with RVR and elevated BP -Will plan for echocardiogram. If stable>>will stop Hep gtt and transition to Eliquis 5mg  BID -May need OP Lexiscan stress test for completion if recurrent symptoms   2. Hx of PAF: -Appears to have been diagnosed after cryptogenic stroke>>>loop recorder placed which showed evidence of AF in 2018. She was placed on anticoagulation with Eliquis and was followed by Dr. Caryl Comes however has not seen him since 2018. She reports loop recorder is no longer active.  -Unclear when her AC was discontinued>>as above, will keep Hep gtt for now and transition to Eliquis after echo results -Add hydralazine for better BP control  -CHA2DS2VASc =6 (female, age, CVA, HTN, DM) -On ASA  3.HTN:  -Elevated, 180/72>157/50>158/53 -On home amlodipine 10, lisinopril 40 -No BB in the setting of bradycardia   4. DM2: -SSI for glucose control while inpatient status  -Per primary team    For questions or updates, please contact [place] Please consult [email or web address] for contact info under Cardiology/STEMI.   SignedKathyrn Drown NP-C [facility] Pager: [phone] 12/08/2018 7:54 AM

## 2018-12-08 NOTE — Progress Notes (Signed)
[place] for heparin Indication: atrial fibrillation  Allergies  Allergen Reactions  . Codeine Nausea Only  . Morphine And Related Itching    Itching     Patient Measurements: Height: 5\' 6"  (167.6 cm) Weight: 138 lb (62.6 kg) IBW/kg (Calculated) : 59.3 Heparin Dosing Weight: 62 kg  Vital Signs: Temp: 97.8 F (36.6 C) (09/07 0212) Temp Source: Oral (09/07 0212) BP: 138/119 (09/07 1315) Pulse Rate: 52 (09/07 0718)  Labs: Recent Labs    12/08/18 0222 12/08/18 0425 12/08/18 0913 12/08/18 1203 12/08/18 1325  HGB 12.9  --   --   --   --   HCT 41.1  --   --   --   --   PLT 186  --   --   --   --   HEPARINUNFRC  --   --   --   --  0.39  CREATININE 1.10*  --   --   --   --   TROPONINIHS 9 27* 55* 52*  --     Estimated Creatinine Clearance: 43.9 mL/min (A) (by C-G formula based on SCr of 1.1 mg/dL (H)).   Medical History: Past Medical History:  Diagnosis Date  . CVA (cerebral vascular accident) ([place]) 2019  . Diabetes mellitus type 2 in nonobese ([facility])   . Hyperlipidemia   . Hypertension    PCP GAVE FOR HER  BLOOD PRESSURE  . IBS (irritable bowel syndrome)   . PUD (peptic ulcer disease)   . TIA (transient ischemic attack) 2006    Medications:  See medication history  Assessment: 73 yo lady with new onset afib to start heparin.  She was not on anticoagulation PTA  Initial heparin level therapeutic at 0.39 on 850 units/hr  Goal of Therapy:  Heparin level 0.3-0.7 units/ml Monitor platelets by anticoagulation protocol: Yes   Plan:  Continue heparin at 850 units/hr Monitor daily heparin level, CBC, s/s bleeding  Bertis Ruddy, PharmD Clinical Pharmacist Please check AMION for all [place] numbers 12/08/2018 1:49 PM

## 2018-12-08 NOTE — ED Provider Notes (Signed)
[facility] EMERGENCY DEPARTMENT Provider Note   CSN: [id number] Arrival date & time: 12/08/18  0208     History   Chief Complaint Chief Complaint  Patient presents with   Chest Pain    HPI Tiffany Wiggins is a 73 y.o. female.     Patient with history of atrial fibrillation not on anticoagulation, diabetes, hypertension, hyperlipidemia presenting from home with episode of chest pain.  States she went to bed feeling well other than urinating frequently.  She got up to use the bathroom and did this without a problem.  She came back from the bathroom developed sharp pressure and pain between her breasts associated with clamminess and nausea.  She is not certain how long this lasted but thinks it was about 1 hour.  EMS reported she was initially in atrial fibrillation but she since converted to sinus rhythm.  She feels better at this time the pain is resolved.  She does not know what made it better.  She not take any medications at home.  She does not have this pain in the past.  Does have a history of acid reflux but this feels different.  Denies any leg pain, back pain, cough or fever.  Denies any history of CAD.  No abdominal pain or back pain.  Her pain is resolved at this time.  The history is provided by the patient, the EMS personnel and a relative.    Past Medical History:  Diagnosis Date   Diabetes mellitus type 2 in nonobese ([place])    Hyperlipidemia    Hypertension    PCP GAVE FOR HER  BLOOD PRESSURE   IBS (irritable bowel syndrome)    PUD (peptic ulcer disease)    Stroke ([place])    TIA  BACK IN  04/2004    Patient Active Problem List   Diagnosis Date Noted   Pain of left hip joint 01/14/2017   Unilateral primary osteoarthritis, left hip 01/14/2017   Cough 02/15/2016   Wheezing 02/15/2016   Perforation of cecum s/p R hemicolectomy with anastamosis (08/25/2015) 08/26/2015   Thoracic back pain 04/25/2015   Chronic venous insufficiency  12/21/2014   Hip arthritis 08/10/2014   Breast pain, right 07/22/2014   Thyroid nodule 05/01/2014   PALPITATIONS, CHRONIC 12/08/2009   HIP PAIN, BILATERAL 09/15/2009   OVERWEIGHT 03/08/2008   Cerebral artery occlusion with cerebral infarction ([place]) 03/08/2008   Hyperlipemia 01/30/2007   Essential hypertension 01/30/2007   Diabetes mellitus type 2, controlled, with complications ([place]) 123456    Past Surgical History:  Procedure Laterality Date   ABDOMINAL HYSTERECTOMY     BIOPSY THYROID  Jan. 2016   LAPAROSCOPY N/A 08/25/2015   Procedure: EXPLORATORY LAPAROTOMY, ILEOCECECTOMY, PRIMARY ANASTOMOSIS, LYSIS OF ADHESIONS;  Surgeon: Autumn Messing III, MD;  Location: [facility] ORS;  Service: General;  Laterality: N/A;   laparoscopy for fertility work up     [place] N/A 03/24/2014   Procedure: LOOP RECORDER IMPLANT;  Surgeon: Deboraha Sprang, MD;  Location: [facility] CATH LAB;  Service: Cardiovascular;  Laterality: N/A;   LUMBAR LAMINECTOMY/DECOMPRESSION MICRODISCECTOMY  04/25/2011   Procedure: LUMBAR LAMINECTOMY/DECOMPRESSION MICRODISCECTOMY;  Surgeon: [name], MD;  Location: [facility] NEURO ORS;  Service: Neurosurgery;  Laterality: Right;  Right Lumbar Four-Five Discectomy   OOPHORECTOMY     TONSILLECTOMY       OB History   No obstetric history on file.      Home Medications    Prior to Admission medications  Medication Sig Start Date End Date Taking? Authorizing Provider  acetaminophen (TYLENOL) 500 MG tablet Take 1,000 mg by mouth 2 (two) times daily.    [provider]  amLODipine (NORVASC) 10 MG tablet TAKE 1 TABLET BY MOUTH ONCE DAILY 03/20/17   Shelda Pal, DO  aspirin 81 MG chewable tablet Chew 81 mg by mouth daily.    [provider]  Coenzyme Q10 (COQ10) 100 MG CAPS Take 1 tablet by mouth daily.    [provider]  Ferrous Gluconate (IRON 27 PO) Take 2 mg by mouth 2 (two) times daily.    [provider]   fluticasone (FLONASE) 50 MCG/ACT nasal spray Place 2 sprays into both nostrils daily. 09/07/16   Shelda Pal, DO  lisinopril (PRINIVIL,ZESTRIL) 40 MG tablet TAKE 1 TABLET BY MOUTH ONCE DAILY 03/20/17   Shelda Pal, DO  metFORMIN (GLUCOPHAGE) 500 MG tablet TAKE 1 TABLET BY MOUTH AT BEDTIME 02/25/17   Wendling, Crosby Oyster, DO  MULTIPLE VITAMIN PO Take 1 tablet by mouth daily.     [provider]  Multiple Vitamins-Minerals (VISION-VITE PRESERVE PO) Take 1 tablet by mouth 2 (two) times daily.    [provider]  omeprazole (PRILOSEC) 20 MG capsule Take 1 capsule (20 mg total) by mouth daily. 09/14/16   Shelda Pal, DO  pravastatin (PRAVACHOL) 40 MG tablet TAKE 1 TABLET BY MOUTH ONCE DAILY 06/19/17   Shelda Pal, DO  Vitamin D, Cholecalciferol, 1000 units TABS Take 1 tablet by mouth daily.    [provider]    Family History Family History  Problem Relation Age of Onset   Cancer Sister        lung   Diabetes Sister    Cancer Brother        lung   Colon cancer Paternal Uncle     Social History Social History   Tobacco Use   Smoking status: Former Smoker    Types: Cigarettes    Quit date: 04/02/2004    Years since quitting: 14.6   Smokeless tobacco: Never Used  Substance Use Topics   Alcohol use: Yes    Alcohol/week: 2.0 standard drinks    Types: 2 Glasses of wine per week   Drug use: No     Allergies   Codeine and Morphine and related   Review of Systems Review of Systems  Constitutional: Negative for activity change, appetite change and fever.  HENT: Negative for congestion and rhinorrhea.   Eyes: Negative for visual disturbance.  Respiratory: Positive for chest tightness and shortness of breath.   Cardiovascular: Positive for chest pain.  Gastrointestinal: Positive for nausea. Negative for abdominal pain and vomiting.  Genitourinary: Negative for dysuria and hematuria.  Musculoskeletal:  Negative for arthralgias and back pain.  Skin: Negative for rash.  Neurological: Negative for dizziness, weakness and headaches.   all other systems are negative except as noted in the HPI and PMH.     Physical Exam Updated Vital Signs BP (!) 169/80 (BP Location: Left Arm)    Pulse 84    Temp 97.8 F (36.6 C) (Oral)    Resp 18    Ht 5\' 6"  (1.676 m)    Wt 62.6 kg    SpO2 99%    BMI 22.27 kg/m   Physical Exam Vitals signs and nursing note reviewed.  Constitutional:      General: She is not in acute distress.    Appearance: Normal appearance. She is well-developed and  normal weight. She is not diaphoretic.  HENT:     Head: Normocephalic and atraumatic.     Mouth/Throat:     Pharynx: No oropharyngeal exudate.  Eyes:     Conjunctiva/sclera: Conjunctivae normal.     Pupils: Pupils are equal, round, and reactive to light.  Neck:     Musculoskeletal: Normal range of motion and neck supple.     Comments: No meningismus. Cardiovascular:     Rate and Rhythm: Normal rate and regular rhythm.     Heart sounds: Normal heart sounds. No murmur.  Pulmonary:     Effort: Pulmonary effort is normal. No respiratory distress.     Breath sounds: Normal breath sounds.  Chest:     Chest wall: No tenderness.  Abdominal:     Palpations: Abdomen is soft.     Tenderness: There is no abdominal tenderness. There is no guarding or rebound.  Musculoskeletal: Normal range of motion.        General: No tenderness.  Skin:    General: Skin is warm.     Capillary Refill: Capillary refill takes less than 2 seconds.  Neurological:     General: No focal deficit present.     Mental Status: She is alert and oriented to person, place, and time. Mental status is at baseline.     Cranial Nerves: No cranial nerve deficit.     Motor: No abnormal muscle tone.     Coordination: Coordination normal.     Comments:  5/5 strength throughout. CN 2-12 intact.Equal grip strength.   Psychiatric:        Behavior: Behavior  normal.      ED Treatments / Results  Labs (all labs ordered are listed, but only abnormal results are displayed) Labs Reviewed  CBC WITH DIFFERENTIAL/PLATELET - Abnormal; Notable for the following components:      Result Value   MCV 104.3 (*)    All other components within normal limits  COMPREHENSIVE METABOLIC PANEL - Abnormal; Notable for the following components:   Glucose, Bld 109 (*)    BUN 24 (*)    Creatinine, Ser 1.10 (*)    Total Protein 6.4 (*)    GFR calc non Af Amer 50 (*)    GFR calc Af Amer 58 (*)    All other components within normal limits  BRAIN NATRIURETIC PEPTIDE - Abnormal; Notable for the following components:   B Natriuretic Peptide 122.5 (*)    All other components within normal limits  D-DIMER, QUANTITATIVE (NOT AT [facility]) - Abnormal; Notable for the following components:   D-Dimer, Quant 1.99 (*)    All other components within normal limits  URINALYSIS, ROUTINE W REFLEX MICROSCOPIC - Abnormal; Notable for the following components:   Color, Urine COLORLESS (*)    All other components within normal limits  TROPONIN I (HIGH SENSITIVITY) - Abnormal; Notable for the following components:   Troponin I (High Sensitivity) 27 (*)    All other components within normal limits  SARS CORONAVIRUS 2 (HOSPITAL ORDER, [place] [facility])  HEPARIN LEVEL (UNFRACTIONATED)  TROPONIN I (HIGH SENSITIVITY)    EKG EKG Interpretation  Date/Time:  Monday December 08 2018 02:13:00 EDT Ventricular Rate:  89 PR Interval:    QRS Duration: 117 QT Interval:  374 QTC Calculation: 456 R Axis:   45 Text Interpretation:  Sinus rhythm Nonspecific intraventricular conduction delay Low voltage, precordial leads Nonspecific ST and T wave abnormality Confirmed by Ezequiel Essex [phone]) on 12/08/2018 2:27:02 AM  Radiology Ct Angio Chest Pe W And/or Wo Contrast  Result Date: 12/08/2018 CLINICAL DATA:  Initial evaluation for acute chest pain, positive D-dimer.  EXAM: CT ANGIOGRAPHY CHEST WITH CONTRAST TECHNIQUE: Multidetector CT imaging of the chest was performed using the standard protocol during bolus administration of intravenous contrast. Multiplanar CT image reconstructions and MIPs were obtained to evaluate the vascular anatomy. CONTRAST:  73mL OMNIPAQUE IOHEXOL 350 MG/ML SOLN COMPARISON:  Prior radiograph from earlier same day. FINDINGS: Cardiovascular: Normal intravascular enhancement seen throughout the intra-abdominal aorta without evidence for aneurysm or other acute aortic pathology. Moderate aortic atherosclerosis noted. Partially visualized great vessels within normal limits. Cardiomegaly. No pericardial effusion. Scattered 3 vessel coronary artery calcifications. Pulmonary arterial tree adequately opacified for evaluation. Main pulmonary artery measures within normal limits for caliber. No filling defect to suggest acute pulmonary embolism identified. Re-formatted imaging confirms these findings. Mediastinum/Nodes: Visualized thyroid within normal limits. No pathologically enlarged mediastinal, hilar, or axillary lymph nodes. Esophagus within normal limits. Lungs/Pleura: Tracheobronchial tree intact and patent. Lungs well inflated. Mild mosaic attenuation involving the lower lobes bilaterally likely reflects a degree of air trapping. No focal infiltrates. No pulmonary edema or pleural effusion. No pneumothorax. No worrisome pulmonary nodule or mass. Upper Abdomen: Visualized upper abdomen demonstrates no acute finding. Musculoskeletal: No acute osseous abnormality. No discrete lytic or blastic osseous lesions. Review of the MIP images confirms the above findings. IMPRESSION: 1. No CT evidence for acute pulmonary embolism. 2. No other acute cardiopulmonary abnormality identified. 3. Cardiomegaly with 3 vessel coronary artery calcifications and moderate aortic atherosclerosis. Electronically Signed   By: Jeannine Boga M.D.   On: 12/08/2018 05:27   Dg  Chest Portable 1 View  Result Date: 12/08/2018 CLINICAL DATA:  Chest pain EXAM: PORTABLE CHEST 1 VIEW COMPARISON:  April 25, 2011 FINDINGS: The heart size and mediastinal contours are within normal limits. Aortic knob calcifications. Both lungs are clear. The visualized skeletal structures are unremarkable. IMPRESSION: No acute cardiopulmonary process. Electronically Signed   By: Prudencio Pair M.D.   On: 12/08/2018 02:51    Procedures Procedures (including critical care time)  Medications Ordered in ED Medications - No data to display   Initial Impression / Assessment and Plan / ED Course  I have reviewed the triage vital signs and the nursing notes.  Pertinent labs & imaging results that were available during my care of the patient were reviewed by me and considered in my medical decision making (see chart for details).       Episode of chest pain that has since resolved.  EKG on arrival shows sinus rhythm with some nonspecific ST depression inferiorly and laterally. EMS EKG does show atrial fibrillation.  No further chest pain throughout ED course.  Initial EKG does show some ST depression inferior laterally which was not seen on subsequent EKGs.  Initial troponin is negative.  Patient remains comfortable denies any chest pain or shortness of breath.  Her d-dimer is positive with subsequent PE study is negative.  There is evidence of three-vessel coronary disease on CT scan.  Troponin has elevated from 9 to 27.  EKG remains sinus rhythm.  Concern for NSTEMI.  Will d/w cardiology.  D/w Dr. Georgette Shell of cardiology who recommends medical admission.  Agrees with heparin in setting of atrial fibrillation and possible non-STEMI.  Patient remains chest pain-free.  Admission discussed with Dr. Blaine Hamper,   CRITICAL CARE Performed by: Ezequiel Essex Total critical care time: 35 minutes Critical care time was exclusive of separately billable procedures  and treating other  patients. Critical care was necessary to treat or prevent imminent or life-threatening deterioration. Critical care was time spent personally by me on the following activities: development of treatment plan with patient and/or surrogate as well as nursing, discussions with consultants, evaluation of patient's response to treatment, examination of patient, obtaining history from patient or surrogate, ordering and performing treatments and interventions, ordering and review of laboratory studies, ordering and review of radiographic studies, pulse oximetry and re-evaluation of patient's condition.   Tiffany Wiggins was evaluated in Emergency Department on 12/08/2018 for the symptoms described in the history of present illness. She was evaluated in the context of the global COVID-19 pandemic, which necessitated consideration that the patient might be at risk for infection with the SARS-CoV-2 virus that causes COVID-19. Institutional protocols and algorithms that pertain to the evaluation of patients at risk for COVID-19 are in a state of rapid change based on information released by regulatory bodies including the CDC and federal and state organizations. These policies and algorithms were followed during the patient's care in the ED.  Final Clinical Impressions(s) / ED Diagnoses   Final diagnoses:  None    ED Discharge Orders    None       Araya Roel, Annie Main, MD 12/08/18 [phone]

## 2018-12-08 NOTE — ED Triage Notes (Signed)
Pt came in EMS with the c/o of chest pain. The chest pain did not wake her up, but upon going to the bathroom, she felt centralized chest pain. This occurred around 0100 this morning.  EKG showed Afib. BP:184/96

## 2018-12-09 ENCOUNTER — Inpatient Hospital Stay (HOSPITAL_COMMUNITY)
Admit: 2018-12-09 | Discharge: 2018-12-09 | Disposition: A | Discharge status: Home / Self Care | Payer: Medicare Other | Attending: Cardiology | Admitting: Cardiology

## 2018-12-09 ENCOUNTER — Encounter (HOSPITAL_COMMUNITY): Payer: Self-pay | Admitting: General Practice

## 2018-12-09 ENCOUNTER — Inpatient Hospital Stay (HOSPITAL_COMMUNITY): Payer: Medicare Other

## 2018-12-09 ENCOUNTER — Other Ambulatory Visit: Payer: Self-pay

## 2018-12-09 DIAGNOSIS — I700 Atherosclerosis of aorta: Secondary | ICD-10-CM

## 2018-12-09 LAB — GLUCOSE, CAPILLARY
Glucose-Capillary: 106 mg/dL — ABNORMAL HIGH (ref 70–99)
Glucose-Capillary: 102 mg/dL — ABNORMAL HIGH (ref 70–99)
Glucose-Capillary: 146 mg/dL — ABNORMAL HIGH (ref 70–99)
Glucose-Capillary: 92 mg/dL (ref 70–99)

## 2018-12-09 LAB — HEPARIN LEVEL (UNFRACTIONATED): Heparin Unfractionated: 0.29 IU/mL — ABNORMAL LOW (ref 0.30–0.70)

## 2018-12-09 LAB — CBC
HCT: 36.1 % (ref 36.0–46.0)
Hemoglobin: 11.9 g/dL — ABNORMAL LOW (ref 12.0–15.0)
MCH: 32.8 pg (ref 26.0–34.0)
MCHC: 33 g/dL (ref 30.0–36.0)
MCV: 99.4 fL (ref 80.0–100.0)
Platelets: 178 10*3/uL (ref 150–400)
RBC: 3.63 MIL/uL — ABNORMAL LOW (ref 3.87–5.11)
RDW: 12 % (ref 11.5–15.5)
WBC: 7.5 10*3/uL (ref 4.0–10.5)
nRBC: 0 % (ref 0.0–0.2)

## 2018-12-09 LAB — BASIC METABOLIC PANEL
Anion gap: 9 (ref 5–15)
BUN: 20 mg/dL (ref 8–23)
CO2: 26 mmol/L (ref 22–32)
Calcium: 9.4 mg/dL (ref 8.9–10.3)
Chloride: 107 mmol/L (ref 98–111)
Creatinine, Ser: 1.07 mg/dL — ABNORMAL HIGH (ref 0.44–1.00)
GFR calc Af Amer: >60 mL/min (ref >60)
GFR calc non Af Amer: 52 mL/min — ABNORMAL LOW (ref >60)
Glucose, Bld: 102 mg/dL — ABNORMAL HIGH (ref 70–99)
Potassium: 3.6 mmol/L (ref 3.5–5.1)
Sodium: 142 mmol/L (ref 135–145)

## 2018-12-09 MED ORDER — SODIUM CHLORIDE 0.9% FLUSH: 10.0000 mL | INTRAVENOUS | Status: DC | PRN

## 2018-12-09 MED ORDER — APIXABAN 5 MG PO TABS
5.0000 mg | ORAL_TABLET | Freq: Two times a day (BID) | ORAL | Status: DC
  Administered 2018-12-09 – 2018-12-10 (×3): 5 mg via ORAL
  Filled 2018-12-09 (×3): qty 1

## 2018-12-09 MED ORDER — NITROGLYCERIN 0.4 MG SL SUBL
SUBLINGUAL_TABLET | SUBLINGUAL | Status: AC
  Administered 2018-12-09: 0.8 mg via SUBLINGUAL
  Filled 2018-12-09: qty 2

## 2018-12-09 MED ORDER — IOHEXOL 350 MG/ML SOLN
80.0000 mL | Freq: Once | INTRAVENOUS | Status: AC | PRN
  Administered 2018-12-09: 80 mL via INTRAVENOUS

## 2018-12-09 MED ORDER — SODIUM CHLORIDE 0.9% FLUSH: 10.0000 mL | Freq: Two times a day (BID) | INTRAVENOUS | Status: DC

## 2018-12-09 NOTE — Progress Notes (Signed)
[place] for heparin Indication: atrial fibrillation  Allergies  Allergen Reactions  . Codeine Nausea Only  . Morphine And Related Itching    Itching     Patient Measurements: Height: 5\' 6"  (167.6 cm) Weight: 151 lb 8 oz (68.7 kg) IBW/kg (Calculated) : 59.3 Heparin Dosing Weight: 62 kg  Vital Signs: Temp: 98.6 F (37 C) (09/08 0915) Temp Source: Oral (09/08 0915) BP: 130/62 (09/08 0915) Pulse Rate: 51 (09/08 0915)  Labs: Recent Labs    12/08/18 0222 12/08/18 0425 12/08/18 0913 12/08/18 1203 12/08/18 1325 12/09/18 0434  HGB 12.9  --   --   --   --  11.9*  HCT 41.1  --   --   --   --  36.1  PLT 186  --   --   --   --  178  HEPARINUNFRC  --   --   --   --  0.39 0.29*  CREATININE 1.10*  --   --   --   --  1.07*  TROPONINIHS 9 27* 55* 52*  --   --     Estimated Creatinine Clearance: 45.1 mL/min (A) (by C-G formula based on SCr of 1.07 mg/dL (H)).   Medical History: Past Medical History:  Diagnosis Date  . CVA (cerebral vascular accident) ([place]) 2019  . Diabetes mellitus type 2 in nonobese ([facility])   . Hyperlipidemia   . Hypertension    PCP GAVE FOR HER  BLOOD PRESSURE  . IBS (irritable bowel syndrome)   . PUD (peptic ulcer disease)   . TIA (transient ischemic attack) 2006    Assessment: 73 yo lady with new onset afib on heparin.  She was not on anticoagulation PTA. Plans noted to change to apixaban today (9/8).  Heparin level subtherapeutic at 0.29 on 850 units/hr, CBC stable  Goal of Therapy:  Heparin level 0.3-0.7 units/ml Monitor platelets by anticoagulation protocol: Yes   Plan:  Given plans to switch to apixaban, will not adjust heparin dose. Will provide education when apixaban starts.  Adella Hare, PharmD Candidate

## 2018-12-09 NOTE — Progress Notes (Addendum)
PROGRESS NOTE    Tiffany Wiggins  [id number] DOB: 1945-11-13 DOA: 12/08/2018 PCP: Raina Mina., MD   Brief Narrative:  Tiffany Wiggins a 73 y.o.femalewith medical history significant ofTIA (2006);afib;HTN; HLD; CKD; and DM presenting with chest pain.Her hospital course remained stable.  Her chest pain resolved after admission.  Troponins were minimally elevated.  She was found to have atrial fibrillation with bradycardia.  She was initially started on heparin drip.  Cardiology consulted.  Underwent echocardiogram which showed normal ejection fraction,impaired relaxation.  Underwent CT coronary study and the final result is pending.    Assessment & Plan:   Principal Problem:   NSTEMI (non-ST elevated myocardial infarction) ([place]) Active Problems:   Hyperlipemia   Essential hypertension   Diabetes mellitus type 2, controlled, with complications ([place])   Paroxysmal A-fib ([facility])    Chest pain: Currently resolved.  EKG showed nonspecific ST changes.  Troponins minimally elevated.  Started on heparin drip for suspicion of unstable angina.  Echocardiogram did not show any clear regional wall motion abnormality, normal ejection fraction but impaired relaxation. She underwent coronary CT imaging ,final result is pending.  History of paroxysmal A. fib: Not on anticoagulation at home but on aspirin.  She was following with Dr. Caryl Comes.  She has been started on Eliquis 5 mg twice a day.CHA2DS2VASc =6 (female, age, CVA, HTN, DM).  She was on aspirin at home.  Hypertension: Blood pressure stable.  Started on losartan.  Continue amlodipine.  No beta-blocker in the setting of bradycardia.  Diabetes type 2: Continue home regimen. A1c 5.8  Hyperlipidemia: LDL of 210.  Started on lipitor.            DVT prophylaxis:Eliquis Code Status: Full Family Communication: Husband Disposition Plan: Home tomorrow,awaiting coronary CT final report   Consultants:  Cardiology  Procedures:Coronary CT  Antimicrobials:  Anti-infectives (From admission, onward)   None      Subjective:  Patient seen and examined at the bed side this morning.No chest pain at present.Hemodynamically stable.Awating coronary CT.Eager to go home.   Objective: Vitals:   12/08/18 2203 12/09/18 0525 12/09/18 0909 12/09/18 0915  BP: (!) 156/64 (!) 137/48 130/62 130/62  Pulse: (!) 53 (!) 53  (!) 51  Resp: 18 18  18   Temp: 98 F (36.7 C) 98.1 F (36.7 C)  98.6 F (37 C)  TempSrc: Oral Oral  Oral  SpO2: 91% 95%  95%  Weight:  68.7 kg    Height:        Intake/Output Summary (Last 24 hours) at 12/09/2018 1539 Last data filed at 12/09/2018 [id number] Gross per 24 hour  Intake 393.94 ml  Output 125 ml  Net 268.94 ml   Filed Weights   12/08/18 0213 12/09/18 0525  Weight: 62.6 kg 68.7 kg    Examination:  General exam: Appears calm and comfortable ,Not in distress,average built HEENT:PERRL,Oral mucosa moist, Ear/Nose normal on gross exam Respiratory system: Bilateral equal air entry, normal vesicular breath sounds, no wheezes or crackles  Cardiovascular system: S1 & S2 heard, RRR. No JVD, murmurs, rubs, gallops or clicks. No pedal edema. Gastrointestinal system: Abdomen is nondistended, soft and nontender. No organomegaly or masses felt. Normal bowel sounds heard. Central nervous system: Alert and oriented. No focal neurological deficits. Extremities: No edema, no clubbing ,no cyanosis, distal peripheral pulses palpable. Skin: No rashes, lesions or ulcers,no icterus ,no pallor MSK: Normal muscle bulk,tone ,power Psychiatry: Judgement and insight appear normal. Mood & affect appropriate.     Data Reviewed:  I have personally reviewed following labs and imaging studies  CBC: Recent Labs  Lab 12/08/18 0222 12/09/18 0434  WBC 8.8 7.5  NEUTROABS 4.5  --   HGB 12.9 11.9*  HCT 41.1 36.1  MCV 104.3* 99.4  PLT 186 0000000   Basic Metabolic Panel: Recent Labs  Lab  12/08/18 0222 12/09/18 0434  NA 144 142  K 4.2 3.6  CL 108 107  CO2 24 26  GLUCOSE 109* 102*  BUN 24* 20  CREATININE 1.10* 1.07*  CALCIUM 9.7 9.4   GFR: Estimated Creatinine Clearance: 45.1 mL/min (A) (by C-G formula based on SCr of 1.07 mg/dL (H)). Liver Function Tests: Recent Labs  Lab 12/08/18 0222  AST 21  ALT 16  ALKPHOS 54  BILITOT 0.4  PROT 6.4*  ALBUMIN 3.8   No results for input(s): LIPASE, AMYLASE in the last 168 hours. No results for input(s): AMMONIA in the last 168 hours. Coagulation Profile: No results for input(s): INR, PROTIME in the last 168 hours. Cardiac Enzymes: No results for input(s): CKTOTAL, CKMB, CKMBINDEX, TROPONINI in the last 168 hours. BNP (last 3 results) No results for input(s): PROBNP in the last 8760 hours. HbA1C: Recent Labs    12/08/18 0913  HGBA1C 5.8*   CBG: Recent Labs  Lab 12/08/18 1206 12/08/18 1700 12/08/18 2202 12/09/18 0820 12/09/18 1212  GLUCAP 95 105* 112* 106* 102*   Lipid Profile: Recent Labs    12/08/18 0913  CHOL 289*  HDL 56  LDLCALC 210*  TRIG 117  CHOLHDL 5.2   Thyroid Function Tests: Recent Labs    12/08/18 0913  TSH 1.717   Anemia Panel: No results for input(s): VITAMINB12, FOLATE, FERRITIN, TIBC, IRON, RETICCTPCT in the last 72 hours. Sepsis Labs: No results for input(s): PROCALCITON, LATICACIDVEN in the last 168 hours.  Recent Results (from the past 240 hour(s))  SARS Coronavirus 2 [facility] order, Performed in [facility] hospital lab) Nasopharyngeal Nasopharyngeal Swab     Status: None   Collection Time: 12/08/18  5:33 AM   Specimen: Nasopharyngeal Swab  Result Value Ref Range Status   SARS Coronavirus 2 NEGATIVE NEGATIVE Final    Comment: (NOTE) If result is NEGATIVE SARS-CoV-2 target nucleic acids are NOT DETECTED. The SARS-CoV-2 RNA is generally detectable in upper and lower  respiratory specimens during the acute phase of infection. The lowest  concentration of SARS-CoV-2  viral copies this assay can detect is 250  copies / mL. A negative result does not preclude SARS-CoV-2 infection  and should not be used as the sole basis for treatment or other  patient management decisions.  A negative result may occur with  improper specimen collection / handling, submission of specimen other  than nasopharyngeal swab, presence of viral mutation(s) within the  areas targeted by this assay, and inadequate number of viral copies  (<250 copies / mL). A negative result must be combined with clinical  observations, patient history, and epidemiological information. If result is POSITIVE SARS-CoV-2 target nucleic acids are DETECTED. The SARS-CoV-2 RNA is generally detectable in upper and lower  respiratory specimens dur ing the acute phase of infection.  Positive  results are indicative of active infection with SARS-CoV-2.  Clinical  correlation with patient history and other diagnostic information is  necessary to determine patient infection status.  Positive results do  not rule out bacterial infection or co-infection with other viruses. If result is PRESUMPTIVE POSTIVE SARS-CoV-2 nucleic acids MAY BE PRESENT.   A presumptive positive result was obtained on  the submitted specimen  and confirmed on repeat testing.  While 2019 novel coronavirus  (SARS-CoV-2) nucleic acids may be present in the submitted sample  additional confirmatory testing may be necessary for epidemiological  and / or clinical management purposes  to differentiate between  SARS-CoV-2 and other Sarbecovirus currently known to infect humans.  If clinically indicated additional testing with an alternate test  methodology [id number]) is advised. The SARS-CoV-2 RNA is generally  detectable in upper and lower respiratory sp ecimens during the acute  phase of infection. The expected result is Negative. Fact Sheet for Patients:  StrictlyIdeas.no Fact Sheet for Healthcare  Providers: BankingDealers.co.za This test is not yet approved or cleared by the [place] FDA and has been authorized for detection and/or diagnosis of SARS-CoV-2 by FDA under an Emergency Use Authorization (EUA).  This EUA will remain in effect (meaning this test can be used) for the duration of the COVID-19 declaration under Section 564(b)(1) of the Act, 21 U.S.C. section 360bbb-3(b)(1), unless the authorization is terminated or revoked sooner. Performed at [facility], [place] [address] [place], [place] [address]          Radiology Studies: Ct Angio Chest Pe W And/or Wo Contrast  Result Date: 12/08/2018 CLINICAL DATA:  Initial evaluation for acute chest pain, positive D-dimer. EXAM: CT ANGIOGRAPHY CHEST WITH CONTRAST TECHNIQUE: Multidetector CT imaging of the chest was performed using the standard protocol during bolus administration of intravenous contrast. Multiplanar CT image reconstructions and MIPs were obtained to evaluate the vascular anatomy. CONTRAST:  50mL OMNIPAQUE IOHEXOL 350 MG/ML SOLN COMPARISON:  Prior radiograph from earlier same day. FINDINGS: Cardiovascular: Normal intravascular enhancement seen throughout the intra-abdominal aorta without evidence for aneurysm or other acute aortic pathology. Moderate aortic atherosclerosis noted. Partially visualized great vessels within normal limits. Cardiomegaly. No pericardial effusion. Scattered 3 vessel coronary artery calcifications. Pulmonary arterial tree adequately opacified for evaluation. Main pulmonary artery measures within normal limits for caliber. No filling defect to suggest acute pulmonary embolism identified. Re-formatted imaging confirms these findings. Mediastinum/Nodes: Visualized thyroid within normal limits. No pathologically enlarged mediastinal, hilar, or axillary lymph nodes. Esophagus within normal limits. Lungs/Pleura: Tracheobronchial tree intact and patent. Lungs well inflated.  Mild mosaic attenuation involving the lower lobes bilaterally likely reflects a degree of air trapping. No focal infiltrates. No pulmonary edema or pleural effusion. No pneumothorax. No worrisome pulmonary nodule or mass. Upper Abdomen: Visualized upper abdomen demonstrates no acute finding. Musculoskeletal: No acute osseous abnormality. No discrete lytic or blastic osseous lesions. Review of the MIP images confirms the above findings. IMPRESSION: 1. No CT evidence for acute pulmonary embolism. 2. No other acute cardiopulmonary abnormality identified. 3. Cardiomegaly with 3 vessel coronary artery calcifications and moderate aortic atherosclerosis. Electronically Signed   By: Jeannine Boga M.D.   On: 12/08/2018 05:27   Ct Coronary Morph W/cta Cor W/score W/ca W/cm &/or Wo/cm  Result Date: 12/09/2018 EXAM: OVER-READ INTERPRETATION  CT CHEST The following report is an over-read performed by radiologist Dr. Vinnie Langton of [facility], [place] on 12/09/2018. This over-read does not include interpretation of cardiac or coronary anatomy or pathology. The coronary calcium score/coronary CTA interpretation by the cardiologist is attached. COMPARISON:  None. FINDINGS: Aortic atherosclerosis. Within the visualized portions of the thorax there are no suspicious appearing pulmonary nodules or masses, there is no acute consolidative airspace disease, no pleural effusions, no pneumothorax and no lymphadenopathy. Visualized portions of the upper abdomen are unremarkable. There are no aggressive appearing lytic or blastic lesions  noted in the visualized portions of the skeleton. IMPRESSION: 1.  Aortic Atherosclerosis (ICD10-I70.0). Electronically Signed   By: Vinnie Langton M.D.   On: 12/09/2018 15:16   Dg Chest Portable 1 View  Result Date: 12/08/2018 CLINICAL DATA:  Chest pain EXAM: PORTABLE CHEST 1 VIEW COMPARISON:  April 25, 2011 FINDINGS: The heart size and mediastinal contours are within normal limits.  Aortic knob calcifications. Both lungs are clear. The visualized skeletal structures are unremarkable. IMPRESSION: No acute cardiopulmonary process. Electronically Signed   By: Prudencio Pair M.D.   On: 12/08/2018 02:51        Scheduled Meds:  amLODipine  10 mg Oral Daily   aspirin  81 mg Oral Daily   atorvastatin  40 mg Oral q1800   fluticasone  2 spray Each Nare Daily   insulin aspart  0-15 Units Subcutaneous TID WC   insulin aspart  0-5 Units Subcutaneous QHS   losartan  25 mg Oral Daily   sodium chloride flush  10-40 mL Intracatheter Q12H   sodium chloride flush  3 mL Intravenous Q12H   Continuous Infusions:  sodium chloride     heparin 850 Units/hr (12/09/18 0916)     LOS: 1 day    Time spent: 25 mins.More than 50% of that time was spent in counseling and/or coordination of care.      Shelly Coss, MD [facility] Pager [phone]  If 7PM-7AM, please contact night-coverage [email or web address] Password [facility] 12/09/2018, 3:39 PM

## 2018-12-09 NOTE — Progress Notes (Addendum)
Progress Note  Patient Name: Tiffany Wiggins Date of Encounter: 12/09/2018  Primary Cardiologist: [place], Dr. Radford Pax   Subjective   Denies recurrent chest pain. No recurrent AF in tele. If no stress test, will stop Hep and transition to Eliquis. Reports hx of statin intolerance   Inpatient Medications    Scheduled Meds: . amLODipine  10 mg Oral Daily  . aspirin  81 mg Oral Daily  . atorvastatin  40 mg Oral q1800  . fluticasone  2 spray Each Nare Daily  . insulin aspart  0-15 Units Subcutaneous TID WC  . insulin aspart  0-5 Units Subcutaneous QHS  . losartan  25 mg Oral Daily  . sodium chloride flush  3 mL Intravenous Q12H   Continuous Infusions: . sodium chloride    . heparin 850 Units/hr (12/09/18 0500)   PRN Meds: sodium chloride, acetaminophen, nitroGLYCERIN, ondansetron (ZOFRAN) IV, sodium chloride flush   Vital Signs    Vitals:   12/08/18 1045 12/08/18 1315 12/08/18 2203 12/09/18 0525  BP: (!) 170/58 (!) 138/119 (!) 156/64 (!) 137/48  Pulse:   (!) 53 (!) 53  Resp: (!) 21 19 18 18   Temp:   98 F (36.7 C) 98.1 F (36.7 C)  TempSrc:   Oral Oral  SpO2:   91% 95%  Weight:    68.7 kg  Height:        Intake/Output Summary (Last 24 hours) at 12/09/2018 0756 Last data filed at 12/09/2018 0500 Gross per 24 hour  Intake 457.98 ml  Output 125 ml  Net 332.98 ml   Filed Weights   12/08/18 0213 12/09/18 0525  Weight: 62.6 kg 68.7 kg    Physical Exam   General: Well developed, well nourished, NAD Neck: Negative for carotid bruits. No JVD Lungs:Clear to ausculation bilaterally. No wheezes, rales, or rhonchi. Breathing is unlabored. Cardiovascular: RRR with S1 S2. No murmurs Abdomen: Soft, non-tender, non-distended. No obvious abdominal masses. Extremities: No edema. No clubbing or cyanosis. DP pulses 2+ bilaterally Neuro: Alert and oriented. No focal deficits. No facial asymmetry. MAE spontaneously. Psych: Responds to questions appropriately with normal affect.    Labs    Chemistry Recent Labs  Lab 12/08/18 0222 12/09/18 0434  NA 144 142  K 4.2 3.6  CL 108 107  CO2 24 26  GLUCOSE 109* 102*  BUN 24* 20  CREATININE 1.10* 1.07*  CALCIUM 9.7 9.4  PROT 6.4*  --   ALBUMIN 3.8  --   AST 21  --   ALT 16  --   ALKPHOS 54  --   BILITOT 0.4  --   GFRNONAA 50* 52*  GFRAA 58* >60  ANIONGAP 12 9     Hematology Recent Labs  Lab 12/08/18 0222 12/09/18 0434  WBC 8.8 7.5  RBC 3.94 3.63*  HGB 12.9 11.9*  HCT 41.1 36.1  MCV 104.3* 99.4  MCH 32.7 32.8  MCHC 31.4 33.0  RDW 12.1 12.0  PLT 186 178    Cardiac EnzymesNo results for input(s): TROPONINI in the last 168 hours. No results for input(s): TROPIPOC in the last 168 hours.   BNP Recent Labs  Lab 12/08/18 0227  BNP 122.5*     DDimer  Recent Labs  Lab 12/08/18 0222  DDIMER 1.99*    Radiology    Ct Angio Chest Pe W And/or Wo Contrast  Result Date: 12/08/2018 CLINICAL DATA:  Initial evaluation for acute chest pain, positive D-dimer. EXAM: CT ANGIOGRAPHY CHEST WITH CONTRAST TECHNIQUE: Multidetector  CT imaging of the chest was performed using the standard protocol during bolus administration of intravenous contrast. Multiplanar CT image reconstructions and MIPs were obtained to evaluate the vascular anatomy. CONTRAST:  5mL OMNIPAQUE IOHEXOL 350 MG/ML SOLN COMPARISON:  Prior radiograph from earlier same day. FINDINGS: Cardiovascular: Normal intravascular enhancement seen throughout the intra-abdominal aorta without evidence for aneurysm or other acute aortic pathology. Moderate aortic atherosclerosis noted. Partially visualized great vessels within normal limits. Cardiomegaly. No pericardial effusion. Scattered 3 vessel coronary artery calcifications. Pulmonary arterial tree adequately opacified for evaluation. Main pulmonary artery measures within normal limits for caliber. No filling defect to suggest acute pulmonary embolism identified. Re-formatted imaging confirms these findings.  Mediastinum/Nodes: Visualized thyroid within normal limits. No pathologically enlarged mediastinal, hilar, or axillary lymph nodes. Esophagus within normal limits. Lungs/Pleura: Tracheobronchial tree intact and patent. Lungs well inflated. Mild mosaic attenuation involving the lower lobes bilaterally likely reflects a degree of air trapping. No focal infiltrates. No pulmonary edema or pleural effusion. No pneumothorax. No worrisome pulmonary nodule or mass. Upper Abdomen: Visualized upper abdomen demonstrates no acute finding. Musculoskeletal: No acute osseous abnormality. No discrete lytic or blastic osseous lesions. Review of the MIP images confirms the above findings. IMPRESSION: 1. No CT evidence for acute pulmonary embolism. 2. No other acute cardiopulmonary abnormality identified. 3. Cardiomegaly with 3 vessel coronary artery calcifications and moderate aortic atherosclerosis. Electronically Signed   By: Jeannine Boga M.D.   On: 12/08/2018 05:27   Dg Chest Portable 1 View  Result Date: 12/08/2018 CLINICAL DATA:  Chest pain EXAM: PORTABLE CHEST 1 VIEW COMPARISON:  April 25, 2011 FINDINGS: The heart size and mediastinal contours are within normal limits. Aortic knob calcifications. Both lungs are clear. The visualized skeletal structures are unremarkable. IMPRESSION: No acute cardiopulmonary process. Electronically Signed   By: Prudencio Pair M.D.   On: 12/08/2018 02:51   Telemetry    12/09/2018 NSR, no AF noted  - Personally Reviewed  ECG    No new tracing as of 12/09/2018 - Personally Reviewed  Cardiac Studies   Stress test 07/25/2016:   Nuclear stress EF: 71%.  There was no ST segment deviation noted during stress.  This is a low risk study.  The left ventricular ejection fraction is hyperdynamic (>65%).  1. Fixed medium-sized, mild mid to apical anteroseptal perfusion defect. No evidence for ischemia. Given normal wall motion, this may be attenuation.  2. Normal LV  systolic function and wall motion.  3. Low risk study.   Echocardiogram 07/25/2016:  Study Conclusions  - Left ventricle: The cavity size was normal. Systolic function was normal. The estimated ejection fraction was in the range of 55% to 60%. Wall motion was normal; there were no regional wall motion abnormalities. Left ventricular diastolic function parameters were normal. - Pulmonary arteries: Systolic pressure was mildly increased. PA peak pressure: 32 mm Hg (S).   LHC 11/04/2013: in Epic  Non-obstructive disease   Patient Profile     73 y.o. female a hx of atrial fibrillation (2018) with prior cryptogenic stroke s/p loop implantation (2015) on chronic AC, DM2, HLD, HTN and PUD who is being seen today for the evaluation of chest pain and atrial fibrillation with RVR at the request of Dr. Lorin Mercy.  Assessment & Plan    1.Chest pain in the setting of AF with RVR: -Pt presented with an episode of chest pain that began overnight after getting up to go to the bathroom. The episode lasted approximately 10-15 in duration and dissipated on its  on. She had no associated symptoms such as SOB, LEE, palpitations, orthopnea, dizziness, syncope, N/V or diaphoresis.  -Pt found to be in AF with RVR per EMS>>she then converted spontaneously to NSR -EKG reassuring with no acute changes -HsT 9>>27>55>52 -BP elevated with SBP in the 160-170 on arrival -IV Hep gtt per pharmacy>>will continue until after echocardiogram performed -Echo with no RWMA mentioned. There is evidence of diastolic dysfunction . -Will stop IV Hep>>start Eliquis 5mg  BID for AF  -May need OP Lexiscan stress test for completion if recurrent symptoms   2. Hx of PAF: -Appears to have been diagnosed after cryptogenic stroke>>>loop recorder placed which showed evidence of AF in 2018. She was placed on anticoagulation with Eliquis and was followed by Dr. Caryl Comes however has not seen him since 2018. She reports loop  recorder is no longer active.  -Unclear when her AC was discontinued>>as above, will stop Hep gtt and transition to Eliquis given stable echo results   -Losartan added 12/08/2018 for better BP control  -CHA2DS2VASc =6 (female, age, CVA, HTN, DM) -On ASA  3.HTN:  -Improved, 137/48>156/64>138/119 -On home amlodipine 10, lisinopril 40 -No BB in the setting of bradycardia  -Losartan added 09/07  4. DM2: -SSI for glucose control while inpatient status  -Per primary team -HBA1c, 5.8   5. HLD: -Uncontrolled, LDL 210 -Reports statin intolerance>>will stop statin and start Zetia. May need lipid clinic referral in the OP setting   Signed, Kathyrn Drown NP-C [facility] Pager: [phone] 12/09/2018, 7:56 AM     For questions or updates, please contact   Please consult [email or web address] for contact info under Cardiology/STEMI.  Personally seen and examined. Agree with above.   Currently chest pain-free, a little anxious.  Here with her husband.  She is missing her dog at home.  GEN: Well nourished, well developed, in no acute distress  HEENT: normal  Neck: no JVD, carotid bruits, or masses Cardiac: RRR; no murmurs, rubs, or gallops,no edema  Respiratory:  clear to auscultation bilaterally, normal work of breathing GI: soft, nontender, nondistended, + BS MS: no deformity or atrophy  Skin: warm and dry, no rash Neuro:  Alert and Oriented x 3, Strength and sensation are intact Psych: euthymic mood, full affect  Telemetry- sinus bradycardia in the 50s no adverse arrhythmias personally reviewed  Assessment and plan:  Chest pain, A. fib RVR - Brief episode of atypical chest pain, cardiac marker, high-sensitivity troponin increased to 55.  Echo with normal EF. -We will go ahead and set her up for a cardiac coronary CT. -Spontaneously converted to sinus rhythm.  Unsure of length of episode. -Left heart cath in 2015 showed no obstructive disease. -Nuclear stress test 2018 was low risk  with no ischemia. - CT of chest showed no PE.  Three-vessel coronary artery calcifications were noted. - Agree with Eliquis, CHADSVASc 6  Diabetes with hypertension - Per primary team.   Candee Furbish, MD

## 2018-12-09 NOTE — Progress Notes (Addendum)
Eliquis card provided by staff RN.  Patient to get cost at pharmacy.

## 2018-12-09 NOTE — Discharge Summary (Addendum)
Physician Discharge Summary  Tiffany Wiggins [id number] DOB: 1945/07/14 DOA: 12/08/2018  PCP: Raina Mina., MD  Admit date: 12/08/2018 Discharge date: 12/10/2018  Admitted From: Home Disposition:  Home  Discharge Condition:Stable CODE STATUS:FULL Diet recommendation: Heart Healthy  Brief/Interim Summary: HPI: Tiffany Wiggins is a 73 y.o. female with medical history significant of TIA (2006); afib; HTN; HLD; CKD; and DM presenting with chest pain.   She got up and went to the bathroom overnight, has been urinating often.  She felt very tired yesterday throughout the day.  10-15 minutes after returning to bed, she developed mid-sternal CP.  She had worsening CP and so called 911.  Nothing seemed to make it worse.  It seems to have resolved spontaneously.  She does not think she received NTG.  Mild SOB.  No diaphoresis or nausea.  She takes ASA only for afib.   Negative MPS in 2018.  Her chest pain is currently resolved.  She reports that "you're scaring me" with most of the questions (NOK, resuscitation, HPI) and that she won't make any health care decisions including about cath without her husband.  Hospital Course:  Her hospital course remained stable.  Her chest pain resolved after admission.  Troponins were minimally elevated.  She was found to have atrial fibrillation with bradycardia.  She was initially started on heparin drip.  Cardiology consulted.  Underwent echocardiogram which showed normal ejection fraction,impaired relaxation.  Underwent CT coronary study which showed mid LAD stenosis. Medical management planned. Patient has been cleared by cardiology for discharge.  She will follow-up with cardiology as an outpatient:  Following problems were addressed during her hospitalization:  Atypical chest pain: Currently resolved.  EKG showed nonspecific ST changes.  Troponins minimally elevated.  Started on heparin drip.  Echocardiogram did not show any clear regional wall motion abnormality,  normal ejection fraction but impaired relaxation. She underwent coronary CT imaging which showed mid LAD stenosis.Medical management planned. Cardiology cleared for discharge.  History of paroxysmal A. fib: Not on anticoagulation at home but on aspirin.  She was following with Dr. Caryl Comes.  She has been started on Eliquis 5 mg twice a day.CHA2DS2VASc =6 (female, age, CVA, HTN, DM).  She was on aspirin at home.  Hypertension: Blood pressure stable.  Started on losartan.  Continue amlodipine.  No beta-blocker in the setting of bradycardia.  Diabetes type 2: Continue home regimen. A1c 5.8  Hyperlipidemia: LDL of 210.  Started on lipitor.      Discharge Diagnoses:  Active Problems:   Hyperlipemia   Essential hypertension   Diabetes mellitus type 2, controlled, with complications ([place])   Paroxysmal A-fib (HCC)   CAD (coronary artery disease)    Discharge Instructions  Discharge Instructions    Diet - low sodium heart healthy   Complete by: As directed    Discharge instructions   Complete by: As directed    1)Follow up with your PCP in a week. 2)Follow up with cardiology in 2 weeks. Name and number of the provider has been attached. 3)Take prescribed medications as instructed.   Increase activity slowly   Complete by: As directed      Allergies as of 12/10/2018      Reactions   Codeine Nausea Only   Morphine And Related Itching   Itching      Medication List    STOP taking these medications   aspirin 81 MG chewable tablet   lisinopril 40 MG tablet Commonly known as: ZESTRIL  TAKE these medications   amLODipine 10 MG tablet Commonly known as: NORVASC TAKE 1 TABLET BY MOUTH ONCE DAILY   apixaban 5 MG Tabs tablet Commonly known as: ELIQUIS Take 1 tablet (5 mg total) by mouth 2 (two) times daily.   atorvastatin 40 MG tablet Commonly known as: LIPITOR Take 1 tablet (40 mg total) by mouth daily at 6 PM.   CoQ10 100 MG Caps Take 1 tablet by mouth daily.    fluticasone 50 MCG/ACT nasal spray Commonly known as: FLONASE Place 2 sprays into both nostrils daily.   losartan 25 MG tablet Commonly known as: COZAAR Take 1 tablet (25 mg total) by mouth daily.   TURMERIC PO Take 1 tablet by mouth daily.   VISION-VITE PRESERVE PO Take 1 tablet by mouth 2 (two) times daily.      Follow-up Information    Raina Mina., MD. Schedule an appointment as soon as possible for a visit in 1 week(s).   Specialty: Internal Medicine Contact information: [place] [address] [phone]        Sueanne Margarita, MD .   Specialty: Cardiology Contact information: [id number] [place] [address] [phone]          Allergies  Allergen Reactions  . Codeine Nausea Only  . Morphine And Related Itching    Itching     Consultations:  Cardiology   Procedures/Studies: Ct Angio Chest Pe W And/or Wo Contrast  Result Date: 12/08/2018 CLINICAL DATA:  Initial evaluation for acute chest pain, positive D-dimer. EXAM: CT ANGIOGRAPHY CHEST WITH CONTRAST TECHNIQUE: Multidetector CT imaging of the chest was performed using the standard protocol during bolus administration of intravenous contrast. Multiplanar CT image reconstructions and MIPs were obtained to evaluate the vascular anatomy. CONTRAST:  20mL OMNIPAQUE IOHEXOL 350 MG/ML SOLN COMPARISON:  Prior radiograph from earlier same day. FINDINGS: Cardiovascular: Normal intravascular enhancement seen throughout the intra-abdominal aorta without evidence for aneurysm or other acute aortic pathology. Moderate aortic atherosclerosis noted. Partially visualized great vessels within normal limits. Cardiomegaly. No pericardial effusion. Scattered 3 vessel coronary artery calcifications. Pulmonary arterial tree adequately opacified for evaluation. Main pulmonary artery measures within normal limits for caliber. No filling defect to suggest acute pulmonary embolism identified.  Re-formatted imaging confirms these findings. Mediastinum/Nodes: Visualized thyroid within normal limits. No pathologically enlarged mediastinal, hilar, or axillary lymph nodes. Esophagus within normal limits. Lungs/Pleura: Tracheobronchial tree intact and patent. Lungs well inflated. Mild mosaic attenuation involving the lower lobes bilaterally likely reflects a degree of air trapping. No focal infiltrates. No pulmonary edema or pleural effusion. No pneumothorax. No worrisome pulmonary nodule or mass. Upper Abdomen: Visualized upper abdomen demonstrates no acute finding. Musculoskeletal: No acute osseous abnormality. No discrete lytic or blastic osseous lesions. Review of the MIP images confirms the above findings. IMPRESSION: 1. No CT evidence for acute pulmonary embolism. 2. No other acute cardiopulmonary abnormality identified. 3. Cardiomegaly with 3 vessel coronary artery calcifications and moderate aortic atherosclerosis. Electronically Signed   By: Jeannine Boga M.D.   On: 12/08/2018 05:27   Ct Coronary Morph W/cta Cor W/score W/ca W/cm &/or Wo/cm  Addendum Date: 12/09/2018   ADDENDUM REPORT: 12/09/2018 16:32 CLINICAL DATA:  Chest pain EXAM: Cardiac CTA MEDICATIONS: Sub lingual nitro. 4mg  x 2 TECHNIQUE: The patient was scanned on a Siemens [id number] slice scanner. Gantry rotation speed was 250 msecs. Collimation was 0.6 mm. A 100 kV prospective scan was triggered in the ascending thoracic aorta at 35-75% of  the R-R interval. Average HR during the scan was bpm. The 3D data set was interpreted on a dedicated work station using MPR, MIP and VRT modes. A total of 80cc of contrast was used. FINDINGS: Non-cardiac: See separate report from [facility]. Pulmonary veins drain normally to the left atrium. The left lower pulmonary vein is has a narrowed, slit-like connection to the left atrium and is in close proximity to the descending thoracic aorta. Calcium Score: 482 Agatston units. Coronary Arteries:  Left dominant with no anomalies LM: Calcified plaque at the ostium, mild (<50%) stenosis. LAD system: Calcified plaque proximal LAD, mild (<50%) stenosis. Calcified plaque mid LAD, probably mild (<50%) stenosis but difficult to tell with blooming artifact. Circumflex system: Moderate ramus. Mixed plaque proximally, mild (<50%) stenosis. Calcified plaque with up to 50% stenosis in the mid ramus. Large, dominant LCx providing left PDA. Calcified plaque proximal LCx, mild <50% stenosis. Calcified plaque distal LCx, mild <50% stenosis). PDA is not well-visualized due to motion artifact but I do not think that there is significant disease. RCA system: Small, nondominant RCA. Calcified plaque proximally, up to 50% stenosis. IMPRESSION: 1. Coronary artery calcium score 482 Agatston units. This places the patient in the 91st percentile for age and gender, suggesting high risk for future cardiac events. 2. Extensive calcified plaque. There does not appear to be critical stenosis present in any of the vessels, but will send for FFR to confirm. Dalton Mclean Electronically Signed   By: Loralie Champagne M.D.   On: 12/09/2018 16:32   Result Date: 12/09/2018 EXAM: OVER-READ INTERPRETATION  CT CHEST The following report is an over-read performed by radiologist Dr. Vinnie Langton of [facility], [place] on 12/09/2018. This over-read does not include interpretation of cardiac or coronary anatomy or pathology. The coronary calcium score/coronary CTA interpretation by the cardiologist is attached. COMPARISON:  None. FINDINGS: Aortic atherosclerosis. Within the visualized portions of the thorax there are no suspicious appearing pulmonary nodules or masses, there is no acute consolidative airspace disease, no pleural effusions, no pneumothorax and no lymphadenopathy. Visualized portions of the upper abdomen are unremarkable. There are no aggressive appearing lytic or blastic lesions noted in the visualized portions of the skeleton.  IMPRESSION: 1.  Aortic Atherosclerosis (ICD10-I70.0). Electronically Signed: By: Vinnie Langton M.D. On: 12/09/2018 15:16   Dg Chest Portable 1 View  Result Date: 12/08/2018 CLINICAL DATA:  Chest pain EXAM: PORTABLE CHEST 1 VIEW COMPARISON:  April 25, 2011 FINDINGS: The heart size and mediastinal contours are within normal limits. Aortic knob calcifications. Both lungs are clear. The visualized skeletal structures are unremarkable. IMPRESSION: No acute cardiopulmonary process. Electronically Signed   By: Prudencio Pair M.D.   On: 12/08/2018 02:51   Ct Coronary Fractional Flow Reserve Data Prep  Result Date: 12/10/2018 CLINICAL DATA:  Chest pain EXAM: CT FFR MEDICATIONS: No additional medications. TECHNIQUE: The coronary CTA was sent for FFR FINDINGS: FFR 0.64 distal LAD FFR 0.82 distal LCx FFR 0.88 mid RCA IMPRESSION: FFR suggests that the mid LAD stenosis may be hemodynamically significant. The lowest FFR is in the far distal LAD, so it is also possible that the low FFR is due to the cumulative effect of extensive non-critical plaque throughout the proximal and mid LAD. Dalton Mclean Electronically Signed   By: Loralie Champagne M.D.   On: 12/10/2018 08:43      Subjective:  Patient seen and examined the bedside this morning.  Hemodynamically stable for discharge today.  Discharge Exam: Vitals:   12/09/18 2012  12/10/18 0507  BP: (!) 159/62 (!) 133/58  Pulse: (!) 59 (!) 51  Resp: 18 16  Temp: 98.2 F (36.8 C) 98.3 F (36.8 C)  SpO2: 100% 98%   Vitals:   12/09/18 1200 12/09/18 1850 12/09/18 2012 12/10/18 0507  BP: 118/64 (!) 124/49 (!) 159/62 (!) 133/58  Pulse: (!) 49 (!) 54 (!) 59 (!) 51  Resp: 18 18 18 16   Temp: 98.4 F (36.9 C) 97.8 F (36.6 C) 98.2 F (36.8 C) 98.3 F (36.8 C)  TempSrc: Oral Oral  Oral  SpO2: 98% 98% 100% 98%  Weight:    73.3 kg  Height:        General: Pt is alert, awake, not in acute distress Cardiovascular: RRR, S1/S2 +, no rubs, no  gallops Respiratory: CTA bilaterally, no wheezing, no rhonchi Abdominal: Soft, NT, ND, bowel sounds + Extremities: no edema, no cyanosis    The results of significant diagnostics from this hospitalization (including imaging, microbiology, ancillary and laboratory) are listed below for reference.     Microbiology: Recent Results (from the past 240 hour(s))  SARS Coronavirus 2 [facility] order, Performed in [facility] hospital lab) Nasopharyngeal Nasopharyngeal Swab     Status: None   Collection Time: 12/08/18  5:33 AM   Specimen: Nasopharyngeal Swab  Result Value Ref Range Status   SARS Coronavirus 2 NEGATIVE NEGATIVE Final    Comment: (NOTE) If result is NEGATIVE SARS-CoV-2 target nucleic acids are NOT DETECTED. The SARS-CoV-2 RNA is generally detectable in upper and lower  respiratory specimens during the acute phase of infection. The lowest  concentration of SARS-CoV-2 viral copies this assay can detect is 250  copies / mL. A negative result does not preclude SARS-CoV-2 infection  and should not be used as the sole basis for treatment or other  patient management decisions.  A negative result may occur with  improper specimen collection / handling, submission of specimen other  than nasopharyngeal swab, presence of viral mutation(s) within the  areas targeted by this assay, and inadequate number of viral copies  (<250 copies / mL). A negative result must be combined with clinical  observations, patient history, and epidemiological information. If result is POSITIVE SARS-CoV-2 target nucleic acids are DETECTED. The SARS-CoV-2 RNA is generally detectable in upper and lower  respiratory specimens dur ing the acute phase of infection.  Positive  results are indicative of active infection with SARS-CoV-2.  Clinical  correlation with patient history and other diagnostic information is  necessary to determine patient infection status.  Positive results do  not rule out bacterial  infection or co-infection with other viruses. If result is PRESUMPTIVE POSTIVE SARS-CoV-2 nucleic acids MAY BE PRESENT.   A presumptive positive result was obtained on the submitted specimen  and confirmed on repeat testing.  While 2019 novel coronavirus  (SARS-CoV-2) nucleic acids may be present in the submitted sample  additional confirmatory testing may be necessary for epidemiological  and / or clinical management purposes  to differentiate between  SARS-CoV-2 and other Sarbecovirus currently known to infect humans.  If clinically indicated additional testing with an alternate test  methodology [phone]) is advised. The SARS-CoV-2 RNA is generally  detectable in upper and lower respiratory sp ecimens during the acute  phase of infection. The expected result is Negative. Fact Sheet for Patients:  StrictlyIdeas.no Fact Sheet for Healthcare Providers: BankingDealers.co.za This test is not yet approved or cleared by the [place] FDA and has been authorized for detection and/or diagnosis of SARS-CoV-2  by FDA under an Emergency Use Authorization (EUA).  This EUA will remain in effect (meaning this test can be used) for the duration of the COVID-19 declaration under Section 564(b)(1) of the Act, 21 U.S.C. section 360bbb-3(b)(1), unless the authorization is terminated or revoked sooner. Performed at [facility], [place] [address] [place], [place] [address]      Labs: BNP (last 3 results) Recent Labs    12/08/18 0227  BNP [id number]*   Basic Metabolic Panel: Recent Labs  Lab 12/08/18 0222 12/09/18 0434  NA 144 142  K 4.2 3.6  CL 108 107  CO2 24 26  GLUCOSE 109* 102*  BUN 24* 20  CREATININE 1.10* 1.07*  CALCIUM 9.7 9.4   Liver Function Tests: Recent Labs  Lab 12/08/18 0222  AST 21  ALT 16  ALKPHOS 54  BILITOT 0.4  PROT 6.4*  ALBUMIN 3.8   No results for input(s): LIPASE, AMYLASE in the last 168 hours. No  results for input(s): AMMONIA in the last 168 hours. CBC: Recent Labs  Lab 12/08/18 0222 12/09/18 0434 12/10/18 0500  WBC 8.8 7.5 8.0  NEUTROABS 4.5  --   --   HGB 12.9 11.9* 12.7  HCT 41.1 36.1 38.9  MCV 104.3* 99.4 100.0  PLT 186 178 210   Cardiac Enzymes: No results for input(s): CKTOTAL, CKMB, CKMBINDEX, TROPONINI in the last 168 hours. BNP: Invalid input(s): POCBNP CBG: Recent Labs  Lab 12/09/18 0820 12/09/18 1212 12/09/18 1707 12/09/18 2135 12/10/18 0824  GLUCAP 106* 102* 146* 92 95   D-Dimer Recent Labs    12/08/18 0222  DDIMER 1.99*   Hgb A1c Recent Labs    12/08/18 0913  HGBA1C 5.8*   Lipid Profile Recent Labs    12/08/18 0913  CHOL 289*  HDL 56  LDLCALC 210*  TRIG 117  CHOLHDL 5.2   Thyroid function studies Recent Labs    12/08/18 0913  TSH 1.717   Anemia work up No results for input(s): VITAMINB12, FOLATE, FERRITIN, TIBC, IRON, RETICCTPCT in the last 72 hours. Urinalysis    Component Value Date/Time   COLORURINE COLORLESS (A) 12/08/2018 0255   APPEARANCEUR CLEAR 12/08/2018 0255   LABSPEC 1.005 12/08/2018 0255   PHURINE 7.0 12/08/2018 0255   GLUCOSEU NEGATIVE 12/08/2018 0255   HGBUR NEGATIVE 12/08/2018 0255   BILIRUBINUR NEGATIVE 12/08/2018 0255   KETONESUR NEGATIVE 12/08/2018 0255   PROTEINUR NEGATIVE 12/08/2018 0255   NITRITE NEGATIVE 12/08/2018 0255   LEUKOCYTESUR NEGATIVE 12/08/2018 0255   Sepsis Labs Invalid input(s): PROCALCITONIN,  WBC,  LACTICIDVEN Microbiology Recent Results (from the past 240 hour(s))  SARS Coronavirus 2 [facility] order, Performed in [facility] hospital lab) Nasopharyngeal Nasopharyngeal Swab     Status: None   Collection Time: 12/08/18  5:33 AM   Specimen: Nasopharyngeal Swab  Result Value Ref Range Status   SARS Coronavirus 2 NEGATIVE NEGATIVE Final    Comment: (NOTE) If result is NEGATIVE SARS-CoV-2 target nucleic acids are NOT DETECTED. The SARS-CoV-2 RNA is generally detectable in upper and  lower  respiratory specimens during the acute phase of infection. The lowest  concentration of SARS-CoV-2 viral copies this assay can detect is 250  copies / mL. A negative result does not preclude SARS-CoV-2 infection  and should not be used as the sole basis for treatment or other  patient management decisions.  A negative result may occur with  improper specimen collection / handling, submission of specimen other  than nasopharyngeal swab, presence of viral mutation(s) within  the  areas targeted by this assay, and inadequate number of viral copies  (<250 copies / mL). A negative result must be combined with clinical  observations, patient history, and epidemiological information. If result is POSITIVE SARS-CoV-2 target nucleic acids are DETECTED. The SARS-CoV-2 RNA is generally detectable in upper and lower  respiratory specimens dur ing the acute phase of infection.  Positive  results are indicative of active infection with SARS-CoV-2.  Clinical  correlation with patient history and other diagnostic information is  necessary to determine patient infection status.  Positive results do  not rule out bacterial infection or co-infection with other viruses. If result is PRESUMPTIVE POSTIVE SARS-CoV-2 nucleic acids MAY BE PRESENT.   A presumptive positive result was obtained on the submitted specimen  and confirmed on repeat testing.  While 2019 novel coronavirus  (SARS-CoV-2) nucleic acids may be present in the submitted sample  additional confirmatory testing may be necessary for epidemiological  and / or clinical management purposes  to differentiate between  SARS-CoV-2 and other Sarbecovirus currently known to infect humans.  If clinically indicated additional testing with an alternate test  methodology [phone]) is advised. The SARS-CoV-2 RNA is generally  detectable in upper and lower respiratory sp ecimens during the acute  phase of infection. The expected result is  Negative. Fact Sheet for Patients:  StrictlyIdeas.no Fact Sheet for Healthcare Providers: BankingDealers.co.za This test is not yet approved or cleared by the [place] FDA and has been authorized for detection and/or diagnosis of SARS-CoV-2 by FDA under an Emergency Use Authorization (EUA).  This EUA will remain in effect (meaning this test can be used) for the duration of the COVID-19 declaration under Section 564(b)(1) of the Act, 21 U.S.C. section 360bbb-3(b)(1), unless the authorization is terminated or revoked sooner. Performed at [facility], [place] [address] [place], [place] [address]     Please note: You were cared for by a hospitalist during your hospital stay. Once you are discharged, your primary care physician will handle any further medical issues. Please note that NO REFILLS for any discharge medications will be authorized once you are discharged, as it is imperative that you return to your primary care physician (or establish a relationship with a primary care physician if you do not have one) for your post hospital discharge needs so that they can reassess your need for medications and monitor your lab values.    Time coordinating discharge: 40 minutes  SIGNED:   Shelly Coss, MD  [facility] 12/10/2018, 9:28 AM Pager [phone]  If 7PM-7AM, please contact night-coverage [email or web address] Password [email or web address]

## 2018-12-10 DIAGNOSIS — I2510 Atherosclerotic heart disease of native coronary artery without angina pectoris: Secondary | ICD-10-CM | POA: Diagnosis present

## 2018-12-10 DIAGNOSIS — E118 Type 2 diabetes mellitus with unspecified complications: Secondary | ICD-10-CM

## 2018-12-10 DIAGNOSIS — E7800 Pure hypercholesterolemia, unspecified: Secondary | ICD-10-CM

## 2018-12-10 LAB — CBC
HCT: 38.9 % (ref 36.0–46.0)
Hemoglobin: 12.7 g/dL (ref 12.0–15.0)
MCH: 32.6 pg (ref 26.0–34.0)
MCHC: 32.6 g/dL (ref 30.0–36.0)
MCV: 100 fL (ref 80.0–100.0)
Platelets: 210 10*3/uL (ref 150–400)
RBC: 3.89 MIL/uL (ref 3.87–5.11)
RDW: 12.1 % (ref 11.5–15.5)
WBC: 8 10*3/uL (ref 4.0–10.5)
nRBC: 0 % (ref 0.0–0.2)

## 2018-12-10 LAB — HEPARIN LEVEL (UNFRACTIONATED): Heparin Unfractionated: >2.2 IU/mL — ABNORMAL HIGH (ref 0.30–0.70)

## 2018-12-10 LAB — GLUCOSE, CAPILLARY
Glucose-Capillary: 95 mg/dL (ref 70–99)
Glucose-Capillary: 104 mg/dL — ABNORMAL HIGH (ref 70–99)

## 2018-12-10 MED ORDER — LOSARTAN POTASSIUM 25 MG PO TABS: 25.0000 mg | ORAL_TABLET | Freq: Every day | ORAL | 0 refills | Status: DC

## 2018-12-10 MED ORDER — APIXABAN 5 MG PO TABS: 5.0000 mg | ORAL_TABLET | Freq: Two times a day (BID) | ORAL | 0 refills | Status: AC

## 2018-12-10 MED ORDER — ATORVASTATIN CALCIUM 40 MG PO TABS: 40.0000 mg | ORAL_TABLET | Freq: Every day | ORAL | 0 refills | Status: DC

## 2018-12-10 NOTE — Progress Notes (Signed)
Progress Note  Patient Name: Tiffany Wiggins Date of Encounter: 12/10/2018  Primary Cardiologist: Fransico Him, MD new  Subjective   Resting comfortably, easily arousable.  Feels well.  No further chest pain or shortness of breath.  Underwent CT of coronaries yesterday.  FFR analysis back.  Inpatient Medications    Scheduled Meds: . amLODipine  10 mg Oral Daily  . apixaban  5 mg Oral BID  . atorvastatin  40 mg Oral q1800  . fluticasone  2 spray Each Nare Daily  . insulin aspart  0-15 Units Subcutaneous TID WC  . insulin aspart  0-5 Units Subcutaneous QHS  . losartan  25 mg Oral Daily  . sodium chloride flush  10-40 mL Intracatheter Q12H  . sodium chloride flush  3 mL Intravenous Q12H   Continuous Infusions: . sodium chloride     PRN Meds: sodium chloride, acetaminophen, nitroGLYCERIN, ondansetron (ZOFRAN) IV, sodium chloride flush, sodium chloride flush   Vital Signs    Vitals:   12/09/18 1200 12/09/18 1850 12/09/18 2012 12/10/18 0507  BP: 118/64 (!) 124/49 (!) 159/62 (!) 133/58  Pulse: (!) 49 (!) 54 (!) 59 (!) 51  Resp: 18 18 18 16   Temp: 98.4 F (36.9 C) 97.8 F (36.6 C) 98.2 F (36.8 C) 98.3 F (36.8 C)  TempSrc: Oral Oral  Oral  SpO2: 98% 98% 100% 98%  Weight:    73.3 kg  Height:        Intake/Output Summary (Last 24 hours) at 12/10/2018 0830 Last data filed at 12/09/2018 2130 Gross per 24 hour  Intake 398.24 ml  Output -  Net 398.24 ml   Last 3 Weights 12/10/2018 12/09/2018 12/08/2018  Weight (lbs) 161 lb 8 oz 151 lb 8 oz 138 lb  Weight (kg) 73.256 kg 68.72 kg 62.596 kg      Telemetry    Sinus bradycardia in the upper 40s, lower 50s.  No further atrial fibrillation- Personally Reviewed  ECG    Original EMS A. fib 121 bpm.  Converted to sinus bradycardia in ER.- Personally Reviewed  Physical Exam   GEN: No acute distress.   Neck: No JVD Cardiac:  Bradycardic regular, no murmurs, rubs, or gallops.  Respiratory: Clear to auscultation bilaterally. GI:  Soft, nontender, non-distended  MS: No edema; No deformity. Neuro:  Nonfocal  Psych: Normal affect   Labs    High Sensitivity Troponin:   Recent Labs  Lab 12/08/18 0222 12/08/18 0425 12/08/18 0913 12/08/18 1203  TROPONINIHS 9 27* 55* 52*      Chemistry Recent Labs  Lab 12/08/18 0222 12/09/18 0434  NA 144 142  K 4.2 3.6  CL 108 107  CO2 24 26  GLUCOSE 109* 102*  BUN 24* 20  CREATININE 1.10* 1.07*  CALCIUM 9.7 9.4  PROT 6.4*  --   ALBUMIN 3.8  --   AST 21  --   ALT 16  --   ALKPHOS 54  --   BILITOT 0.4  --   GFRNONAA 50* 52*  GFRAA 58* >60  ANIONGAP 12 9     Hematology Recent Labs  Lab 12/08/18 0222 12/09/18 0434 12/10/18 0500  WBC 8.8 7.5 8.0  RBC 3.94 3.63* 3.89  HGB 12.9 11.9* 12.7  HCT 41.1 36.1 38.9  MCV 104.3* 99.4 100.0  MCH 32.7 32.8 32.6  MCHC 31.4 33.0 32.6  RDW 12.1 12.0 12.1  PLT 186 178 210    BNP Recent Labs  Lab 12/08/18 0227  BNP 122.5*  DDimer  Recent Labs  Lab 12/08/18 0222  DDIMER 1.99*     Radiology    Ct Coronary Morph W/cta Cor W/score W/ca W/cm &/or Wo/cm  Addendum Date: 12/09/2018   ADDENDUM REPORT: 12/09/2018 16:32 CLINICAL DATA:  Chest pain EXAM: Cardiac CTA MEDICATIONS: Sub lingual nitro. 4mg  x 2 TECHNIQUE: The patient was scanned on a Siemens [id number] slice scanner. Gantry rotation speed was 250 msecs. Collimation was 0.6 mm. A 100 kV prospective scan was triggered in the ascending thoracic aorta at 35-75% of the R-R interval. Average HR during the scan was bpm. The 3D data set was interpreted on a dedicated work station using MPR, MIP and VRT modes. A total of 80cc of contrast was used. FINDINGS: Non-cardiac: See separate report from [facility]. Pulmonary veins drain normally to the left atrium. The left lower pulmonary vein is has a narrowed, slit-like connection to the left atrium and is in close proximity to the descending thoracic aorta. Calcium Score: 482 Agatston units. Coronary Arteries: Left dominant  with no anomalies LM: Calcified plaque at the ostium, mild (<50%) stenosis. LAD system: Calcified plaque proximal LAD, mild (<50%) stenosis. Calcified plaque mid LAD, probably mild (<50%) stenosis but difficult to tell with blooming artifact. Circumflex system: Moderate ramus. Mixed plaque proximally, mild (<50%) stenosis. Calcified plaque with up to 50% stenosis in the mid ramus. Large, dominant LCx providing left PDA. Calcified plaque proximal LCx, mild <50% stenosis. Calcified plaque distal LCx, mild <50% stenosis). PDA is not well-visualized due to motion artifact but I do not think that there is significant disease. RCA system: Small, nondominant RCA. Calcified plaque proximally, up to 50% stenosis. IMPRESSION: 1. Coronary artery calcium score 482 Agatston units. This places the patient in the 91st percentile for age and gender, suggesting high risk for future cardiac events. 2. Extensive calcified plaque. There does not appear to be critical stenosis present in any of the vessels, but will send for FFR to confirm. Dalton Mclean Electronically Signed   By: Loralie Champagne M.D.   On: 12/09/2018 16:32   Result Date: 12/09/2018 EXAM: OVER-READ INTERPRETATION  CT CHEST The following report is an over-read performed by radiologist Dr. Vinnie Langton of [facility], [place] on 12/09/2018. This over-read does not include interpretation of cardiac or coronary anatomy or pathology. The coronary calcium score/coronary CTA interpretation by the cardiologist is attached. COMPARISON:  None. FINDINGS: Aortic atherosclerosis. Within the visualized portions of the thorax there are no suspicious appearing pulmonary nodules or masses, there is no acute consolidative airspace disease, no pleural effusions, no pneumothorax and no lymphadenopathy. Visualized portions of the upper abdomen are unremarkable. There are no aggressive appearing lytic or blastic lesions noted in the visualized portions of the skeleton. IMPRESSION: 1.   Aortic Atherosclerosis (ICD10-I70.0). Electronically Signed: By: Vinnie Langton M.D. On: 12/09/2018 15:16    Cardiac Studies   Cardiac CT- multiple calcific and noncalcified plaque lesions.  Fractional flow reserve or FFR analysis does show some distal LAD flow limitation, starting initially in the mid segment around a calcified lesion.  Aortic atherosclerosis noted.  Cardiac catheterization 2015-nonobstructive disease  Echo-normal EF  Nuclear stress test 2018-no ischemia  Patient Profile     73 y.o. female with paroxysmal atrial fibrillation diagnosed in 2018 in the setting of cryptogenic stroke status post loop implantation in 2015 with chronic diabetes hypertension hyperlipidemia peptic ulcer disease who had chest discomfort atrial fibrillation with rapid ventricular response and troponin elevation consistent with demand ischemia in the setting of A.  fib RVR.  Assessment & Plan    Demand ischemia -I do not think that she had true ACS based upon her further testing, coronary CT.  We will continue with aggressive medical management.  Control of heart rate.  Statin.  Paroxysmal atrial fibrillation - Chest pain was in the setting of A. fib.  Likely a mild degree of demand ischemia.  If atrial fibrillation becomes more frequent or more severe, may require antiarrhythmic.  She would not be a candidate for class Ic agents based upon her known coronary artery disease.  She does follow with Dr. Jolyn Nap.  She is not on any beta-blocker secondary to bradycardia.  Essential hypertension with diabetes - Amlodipine, losartan.  No beta-blocker secondary to bradycardia.  Hyperlipidemia -LDL 210 previously.  Atorvastatin, 40 mg administered.  I think a lipid clinic referral would be helpful for her.  She is okay to discharge from cardiology perspective. We will set up follow-up with her (Dr. Theodosia Blender team).      For questions or updates, please contact [place] Please consult  [email or web address] for contact info under        Signed, Candee Furbish, MD  12/10/2018, 8:30 AM

## 2018-12-12 ENCOUNTER — Telehealth: Payer: Self-pay | Admitting: Cardiology

## 2018-12-12 NOTE — Telephone Encounter (Signed)
Agree with plan by Enis Slipper, RN

## 2018-12-12 NOTE — Telephone Encounter (Signed)
New message     Pt c/o medication issue:  1. Name of Medication: losartin  2. How are you currently taking this medication (dosage and times per day)? 25mg  3. Are you having a reaction (difficulty breathing--STAT)? no 4. What is your medication issue? Pt took losartin this am for the first time. After taking medication, pt c/o lightheadedness and "in a fog".  Bp this am 97/53 HR 41.  Pt took a nap and at 12:30 bp was 143/62 HR 41.  Is there any need to be concerned?

## 2018-12-12 NOTE — Telephone Encounter (Signed)
I spoke with pt's husband. Losartan was started in the hospital. Pt was discharged Wednesday afternoon and did not start home losartan until today. She took amlodipine and losartan at 8:30 and around 10 AM she felt "in a fog" and "out of sorts".  BP was 97/53 and heart rate 41.  They use a wrist monitor which they checked against EMS readings prior to recent hospitalization and it was accurate. Husband reports other BP reading of 116/59 since hospital discharge.  Last readings were 143/62 and heart rate 41.  Pt is feeling fine and is out working in the flower bed.  Husband reports heart rate usually is in mid 40's to low 50's.  I advised husband to have pt hold losartan if systolic BP is less than [id number] for now. I told him if heart rate continues to run in low 40's and pt develops dizziness, lightheadedness or feeling as if she may pass out she should be evaluated in ED. Will forward to DOD for review/recommendations.

## 2018-12-29 ENCOUNTER — Ambulatory Visit: Payer: Medicare Other

## 2019-01-05 DIAGNOSIS — R079 Chest pain, unspecified: Secondary | ICD-10-CM | POA: Insufficient documentation

## 2019-01-05 NOTE — Progress Notes (Signed)
Cardiology Office Note    Date:  01/06/2019   ID:  Tiffany Wiggins, DOB [birth year], MRN [id number]  PCP:  Raina Mina., MD  Cardiologist: Fransico Him, MD EPS: Virl Axe, MD  Chief Complaint  Patient presents with  . Hospitalization Follow-up    History of Present Illness:  Tiffany Wiggins is a 73 y.o. female with paroxysmal atrial fibrillation diagnosed in 2018 in the setting of cryptogenic stroke status post loop implantation in 2015 with chronic diabetes hypertension hyperlipidemia peptic ulcer disease who was admitted with chest discomfort 12/2018 in the setting of  atrial fibrillation with rapid ventricular response and troponin elevation consistent with demand ischemia in the setting of A. fib RVR. Followed by Dr. Caryl Comes. Not on beta blocker b/c of bradycardia. Not a candidate for class 1c agents with CAD.Coronary CT is below. Was sent home on losartan and amlodipine but losartan stopped as outpatient b/c of low BP and dizziness.  Patient comes in for post hospital f/u. They restarted losartan and now she is doing fine on it.  When she is sleeping she thinks her HR is going fast. Denies chest pain, shortness of breath, dizziness or presyncope. Was on rosuvastatin prior to hospital but put on lipitor.        Past Medical History:  Diagnosis Date  . CVA (cerebral vascular accident) ([place]) 2019  . Diabetes mellitus type 2 in nonobese (HCC)   . Hyperlipidemia   . Hypertension    PCP GAVE FOR HER  BLOOD PRESSURE  . IBS (irritable bowel syndrome)   . PUD (peptic ulcer disease)   . TIA (transient ischemic attack) 2006    Past Surgical History:  Procedure Laterality Date  . ABDOMINAL HYSTERECTOMY    . BIOPSY THYROID  Jan. 2016  . LAPAROSCOPY N/A 08/25/2015   Procedure: EXPLORATORY LAPAROTOMY, ILEOCECECTOMY, PRIMARY ANASTOMOSIS, LYSIS OF ADHESIONS;  Surgeon: Autumn Messing III, MD;  Location: [facility] ORS;  Service: General;  Laterality: N/A;  . laparoscopy for fertility work up    .  LOOP RECORDER IMPLANT N/A 03/24/2014   Procedure: LOOP RECORDER IMPLANT;  Surgeon: Deboraha Sprang, MD;  Location: [facility] CATH LAB;  Service: Cardiovascular;  Laterality: N/A;  . LUMBAR LAMINECTOMY/DECOMPRESSION MICRODISCECTOMY  04/25/2011   Procedure: LUMBAR LAMINECTOMY/DECOMPRESSION MICRODISCECTOMY;  Surgeon: [name], MD;  Location: [place] [facility]URO ORS;  Service: Neurosurgery;  Laterality: Right;  Right Lumbar Four-Five Discectomy  . OOPHORECTOMY    . TONSILLECTOMY      Current Medications: Current Meds  Medication Sig  . amLODipine (NORVASC) 10 MG tablet TAKE 1 TABLET BY MOUTH ONCE DAILY  . apixaban (ELIQUIS) 5 MG TABS tablet Take 1 tablet (5 mg total) by mouth 2 (two) times daily.  Marland Kitchen atorvastatin (LIPITOR) 40 MG tablet Take 1 tablet (40 mg total) by mouth daily at 6 PM.  . Coenzyme Q10 (COQ10) 100 MG CAPS Take 1 tablet by mouth daily.  Marland Kitchen losartan (COZAAR) 25 MG tablet Take 1 tablet (25 mg total) by mouth daily.  . Multiple Vitamins-Minerals (VISION-VITE PRESERVE PO) Take 1 tablet by mouth 2 (two) times daily.  . rosuvastatin (CRESTOR) 5 MG tablet Take 5 mg by mouth daily.  . TURMERIC PO Take 1 tablet by mouth daily.     Allergies:   Codeine and Morphine and related   Social History   Socioeconomic History  . Marital status: Married    Spouse name: Not on file  . Number of children: Not on file  . Years of  education: Not on file  . Highest education level: Not on file  Occupational History  . Occupation: retired  Scientific laboratory technician  . Financial resource strain: Not on file  . Food insecurity    Worry: Not on file    Inability: Not on file  . Transportation needs    Medical: Not on file    Non-medical: Not on file  Tobacco Use  . Smoking status: Former Smoker    Types: Cigarettes    Quit date: 04/02/2004    Years since quitting: 14.7  . Smokeless tobacco: Never Used  Substance and Sexual Activity  . Alcohol use: Yes    Alcohol/week: 2.0 standard drinks    Types: 2 Glasses of  wine per week  . Drug use: No  . Sexual activity: Not on file  Lifestyle  . Physical activity    Days per week: Not on file    Minutes per session: Not on file  . Stress: Not on file  Relationships  . Social Herbalist on phone: Not on file    Gets together: Not on file    Attends religious service: Not on file    Active member of club or organization: Not on file    Attends meetings of clubs or organizations: Not on file    Relationship status: Not on file  Other Topics Concern  . Not on file  Social History Narrative  . Not on file     Family History:  The patient's   family history includes Cancer in her brother and sister; Colon cancer in her paternal uncle; Diabetes in her sister.   ROS:   Please see the history of present illness.    ROS All other systems reviewed and are negative.   PHYSICAL EXAM:   VS:  BP 136/60   Pulse 60   Ht 5\' 6"  (1.676 m)   Wt 166 lb 6.4 oz (75.5 kg)   SpO2 98%   BMI 26.86 kg/m   Physical Exam  GEN: Well nourished, well developed, in no acute distress  Neck:bilateral carotid bruits no JVD, , or masses Cardiac:RRR; 2/6 systolic murmur LSB Respiratory:  clear to auscultation bilaterally, normal work of breathing GI: soft, nontender, nondistended, + BS Ext: without cyanosis, clubbing, or edema, Good distal pulses bilaterally Neuro:  Alert and Oriented x 3 Psych: euthymic mood, full affect  Wt Readings from Last 3 Encounters:  01/06/19 166 lb 6.4 oz (75.5 kg)  12/10/18 161 lb 8 oz (73.3 kg)  03/01/17 177 lb (80.3 kg)      Studies/Labs Reviewed:   EKG:  EKG is not ordered today.    Recent Labs: 12/08/2018: ALT 16; B Natriuretic Peptide 122.5; TSH 1.717 12/09/2018: BUN 20; Creatinine, Ser 1.07; Potassium 3.6; Sodium 142 12/10/2018: Hemoglobin 12.7; Platelets 210   Lipid Panel    Component Value Date/Time   CHOL 289 (H) 12/08/2018 0913   TRIG 117 12/08/2018 0913   HDL 56 12/08/2018 0913   CHOLHDL 5.2 12/08/2018 0913    VLDL 23 12/08/2018 0913   LDLCALC 210 (H) 12/08/2018 0913   LDLDIRECT 167.0 07/19/2015 1108    Additional studies/ records that were reviewed today include:  Echo 12/08/18 IMPRESSIONS      1. The left ventricle has normal systolic function with an ejection fraction of 60-65%. The cavity size was normal. There is mildly increased left ventricular wall thickness. Left ventricular diastolic Doppler parameters are consistent with impaired  relaxation. Elevated left ventricular end-diastolic  pressure.  2. The right ventricle has normal systolic function. The cavity was normal. There is no increase in right ventricular wall thickness. Right ventricular systolic pressure could not be assessed.  3. The aortic valve was not well visualized. Mild sclerosis of the aortic valve.  4. The aorta is normal unless otherwise noted.   FINDINGS  Left Ventricle: The left ventricle has normal systolic function, with an ejection fraction of 60-65%. The cavity size was normal. There is mildly increased left ventricular wall thickness. Left ventricular diastolic Doppler parameters are consistent  with impaired relaxation. Elevated left ventricular end-diastolic pressure   Right Ventricle: The right ventricle has normal systolic function. The cavity was normal. There is no increase in right ventricular wall thickness. Right ventricular systolic pressure could not be assessed.   Left Atrium: Left atrial size was normal in size.   Right Atrium: Right atrial size was normal in size.   Interatrial Septum: No atrial level shunt detected by color flow Doppler.   Pericardium: There is no evidence of pericardial effusion.   Mitral Valve: The mitral valve is normal in structure. Mitral valve regurgitation is trivial by color flow Doppler.   Tricuspid Valve: The tricuspid valve is normal in structure. Tricuspid valve regurgitation is trivial by color flow Doppler.   Aortic Valve: The aortic valve was not well visualized  Mild sclerosis of the aortic valve. Aortic valve regurgitation was not visualized by color flow Doppler.   Pulmonic Valve: The pulmonic valve was normal in structure. Pulmonic valve regurgitation is not visualized by color flow Doppler.   Aorta: The aorta is normal unless otherwise noted.   Venous: The inferior vena cava measures 1.70 cm, is normal in size with greater than 50% respiratory variability.   Coronary CT FFR 12/09/18 IMPRESSION: FFR suggests that the mid LAD stenosis may be hemodynamically significant. The lowest FFR is in the far distal LAD, so it is also possible that the low FFR is due to the cumulative effect of extensive non-critical plaque throughout the proximal and mid LAD.   Dalton Mclean    Calcium Score: 482 Agatston units.   Coronary Arteries: Left dominant with no anomalies   LM: Calcified plaque at the ostium, mild (<50%) stenosis.   LAD system: Calcified plaque proximal LAD, mild (<50%) stenosis. Calcified plaque mid LAD, probably mild (<50%) stenosis but difficult to tell with blooming artifact.   Circumflex system: Moderate ramus. Mixed plaque proximally, mild (<50%) stenosis. Calcified plaque with up to 50% stenosis in the mid ramus. Large, dominant LCx providing left PDA. Calcified plaque proximal LCx, mild <50% stenosis. Calcified plaque distal LCx, mild <50% stenosis). PDA is not well-visualized due to motion artifact but I do not think that there is significant disease.   RCA system: Small, nondominant RCA. Calcified plaque proximally, up to 50% stenosis.   IMPRESSION: 1. Coronary artery calcium score 482 Agatston units. This places the patient in the 91st percentile for age and gender, suggesting high risk for future cardiac events.   2. Extensive calcified plaque. There does not appear to be critical stenosis present in any of the vessels, but will send for FFR to confirm.   Dalton Mclean     Electronically Signed   By: Loralie Champagne M.D.      ASSESSMENT:    1. Paroxysmal A-fib (HCC)   2. Chest pain of uncertain etiology   3. Essential hypertension   4. Hyperlipidemia, unspecified hyperlipidemia type   5. Controlled type 2 diabetes mellitus  with complication, without long-term current use of insulin ([place])   6. History of CVA (cerebrovascular accident)      PLAN:  In order of problems listed above:  PAF with RVR and troponin elevation felt to be demand ischemia. No beta blocker b/c of bradycardia. Started on Eliquis. Loop recorder no longer working. Thinks she's having fast HR at night. Will place 30 day monitor  CAD Chest pain with elevated troponin felt to be demand ischmeia. Coronary CT -mid LAD stenosis medical management-see results above. Normal LVEF on echo. Wants to f/u in [place] for general card but Dr. Caryl Comes for EPS. Will arrange  HTN BP stable   HLD LDL 210 started on lipitor-so far tolerating well.history of leg cramps on it. Check flp in 3 months  DM2 managed by pcp  History of CVA on eliquis    Medication Adjustments/Labs and Tests Ordered: Current medicines are reviewed at length with the patient today.  Concerns regarding medicines are outlined above.  Medication changes, Labs and Tests ordered today are listed in the Patient Instructions below. Patient Instructions  Medication Instructions:  Your physician recommends that you continue on your current medications as directed. Please refer to the Current Medication list given to you today.  If you need a refill on your cardiac medications before your next appointment, please call your pharmacy.   Lab work: Your physician recommends that you return for a FASTING lipid profile on 04/08/19   If you have labs (blood work) drawn today and your tests are completely normal, you will receive your results only by: Marland Kitchen MyChart Message (if you have MyChart) OR . A paper copy in the mail If you have any lab test that is abnormal or we need  to change your treatment, we will call you to review the results.  Testing/Procedures: Your physician has recommended that you wear an event monitor. Event monitors are medical devices that record the heart's electrical activity. Doctors most often [place] these monitors to diagnose arrhythmias. Arrhythmias are problems with the speed or rhythm of the heartbeat. The monitor is a small, portable device. You can wear one while you do your normal daily activities. This is usually used to diagnose what is causing palpitations/syncope (passing out).  Follow-Up: . Your physician wants you to follow-up in: 3-4 months in the [facility] office.  You will receive a reminder letter in the mail two months in advance. If you don't receive a letter, please call our office to schedule the follow-up appointment. . Your physician wants you to follow-up in: 3 months with Dr. Caryl Comes. You will receive a reminder letter in the mail two months in advance. If you don't receive a letter, please call our office to schedule the follow-up appointment.   Any Other Special Instructions Will Be Listed Below (If Applicable).       Signed, Ermalinda Barrios, PA-C  01/06/2019 2:20 PM    [facility] [address], [place], [place]  [address] Phone: [phone]; Fax: [phone]

## 2019-01-06 ENCOUNTER — Encounter: Payer: Self-pay | Admitting: Physician Assistant

## 2019-01-06 ENCOUNTER — Ambulatory Visit: Payer: Medicare Other | Admitting: Physician Assistant

## 2019-01-06 ENCOUNTER — Other Ambulatory Visit: Payer: Self-pay

## 2019-01-06 VITALS — BP 136/60 | HR 60 | Ht 66.0 in | Wt 166.4 lb

## 2019-01-06 DIAGNOSIS — R079 Chest pain, unspecified: Secondary | ICD-10-CM

## 2019-01-06 DIAGNOSIS — Z8673 Personal history of transient ischemic attack (TIA), and cerebral infarction without residual deficits: Secondary | ICD-10-CM

## 2019-01-06 DIAGNOSIS — I480 Paroxysmal atrial fibrillation: Secondary | ICD-10-CM | POA: Diagnosis not present

## 2019-01-06 DIAGNOSIS — E785 Hyperlipidemia, unspecified: Secondary | ICD-10-CM

## 2019-01-06 DIAGNOSIS — I10 Essential (primary) hypertension: Secondary | ICD-10-CM

## 2019-01-06 DIAGNOSIS — E118 Type 2 diabetes mellitus with unspecified complications: Secondary | ICD-10-CM

## 2019-01-06 NOTE — Patient Instructions (Signed)
Medication Instructions:  Your physician recommends that you continue on your current medications as directed. Please refer to the Current Medication list given to you today.  If you need a refill on your cardiac medications before your next appointment, please call your pharmacy.   Lab work: Your physician recommends that you return for a FASTING lipid profile on 04/08/19   If you have labs (blood work) drawn today and your tests are completely normal, you will receive your results only by: Marland Kitchen MyChart Message (if you have MyChart) OR . A paper copy in the mail If you have any lab test that is abnormal or we need to change your treatment, we will call you to review the results.  Testing/Procedures: Your physician has recommended that you wear an event monitor. Event monitors are medical devices that record the heart's electrical activity. Doctors most often [place] these monitors to diagnose arrhythmias. Arrhythmias are problems with the speed or rhythm of the heartbeat. The monitor is a small, portable device. You can wear one while you do your normal daily activities. This is usually used to diagnose what is causing palpitations/syncope (passing out).  Follow-Up: . Your physician wants you to follow-up in: 3-4 months in the [facility] office.  You will receive a reminder letter in the mail two months in advance. If you don't receive a letter, please call our office to schedule the follow-up appointment. . Your physician wants you to follow-up in: 3 months with Dr. Caryl Comes. You will receive a reminder letter in the mail two months in advance. If you don't receive a letter, please call our office to schedule the follow-up appointment.   Any Other Special Instructions Will Be Listed Below (If Applicable).

## 2019-01-14 ENCOUNTER — Telehealth: Payer: Self-pay | Admitting: *Deleted

## 2019-01-14 NOTE — Telephone Encounter (Signed)
[employer] to ship a 30 day cardiac event monitor to the patients home.  Instructions included in the monitor kit. 

## 2019-01-20 ENCOUNTER — Telehealth: Payer: Self-pay | Admitting: Physician Assistant

## 2019-01-20 NOTE — Telephone Encounter (Signed)
  Husband would like to come with his wife to her appt on 01/28/19 with Tommye Standard to discuss issues with her device and possible removal.

## 2019-01-23 ENCOUNTER — Ambulatory Visit
Admission: RE | Admit: 2019-01-23 | Discharge: 2019-01-23 | Disposition: A | Discharge status: Home / Self Care | Payer: Medicare Other | Source: Ambulatory Visit | Attending: Internal Medicine | Admitting: Internal Medicine

## 2019-01-23 ENCOUNTER — Other Ambulatory Visit: Payer: Self-pay

## 2019-01-23 DIAGNOSIS — Z1231 Encounter for screening mammogram for malignant neoplasm of breast: Secondary | ICD-10-CM

## 2019-01-26 NOTE — Progress Notes (Signed)
Cardiology Office Note Date:  01/28/2019  Patient ID:  Sunaina, Gevara [birth year], MRN [id number] PCP:  Raina Mina., MD  Cardiologist:  Dr. Radford Pax (new, last admission, has not seen out patient) Electrophysiologist: Dr. Caryl Comes     Chief Complaint: wants the loop removed  History of Present Illness: ROSALEY ZAHEER is a 73 y.o. female with history of HTN, HLD, PUD, IBS, cryptogenic stroke >> loop >> AFib  She comes in today to be seen for Dr. Caryl Comes.  Seems she last saw him in 2018.  At that time much emotional stress and issues, as well as some degree of cognitive dysfunction.  She also was not taking her a/c, this was discussed, and Eliquis resumed (presumably).  She was hospitalized 12/2018 in the setting of  atrial fibrillation with rapid ventricular response and troponin elevation consistent with demand ischemia in the setting of A. fib RVR. Followed by Dr. Caryl Comes. Not on beta blocker b/c of bradycardia. Not a candidate for class 1c agents with CAD.Coronary CT noted significant calcium though no apparent critical lesion. Was sent home on losartan and amlodipine but losartan stopped as outpatient b/c of low BP and dizziness though eventually resumed and tolerating.  She mentioned she felt her HR was fast at night when sleeping.  Most recently she saw Gerrianne Scale, PA-C 01/06/2019.  Her loop with battery EOS, was planned to 30 day monitor to evaluate her feeling of fast HR at night. (shipped to pt 10/14).  The patient comes today accompanied by her husband, mentioning since her stroke, she has some trouble recalling details, specifics of things at times, and wanted to be sure he was with her to help.  All in all doing OK since her hospital stay without recurrent CP, symptoms.  She mentions some nights when she gets up to go to the bathroom she feels like her HR is fast, she doesn't think wakes her, but noticies it fast once up.  No associated symptoms with these palpitations.  She has not had  any fainting, near fainting Doing well with her Eliquis, no bleeding or signs of bleeding.  AT the hospital her lipids quite high, started on Lipitor, tolerating ths OK  She put the event monitor on yesterday afternoon, a coule hours later her skin was red, irritated and she had to remove it.  Mentions she has had significant sensitivity to tapes, dressing, monitor leads in the past as well.  She would like the loop monitor removed.  She finds especially at night that whenlaying on her side she can feel it pinch and given it has not worked in years would like it removed.  Device information MDT loop implanted 03/24/14, cryptogenic stroke No longer working   Past Medical History:  Diagnosis Date   CVA (cerebral vascular accident) ([place]) 2019   Diabetes mellitus type 2 in nonobese ([place])    Hyperlipidemia    Hypertension    PCP GAVE FOR HER  BLOOD PRESSURE   IBS (irritable bowel syndrome)    PUD (peptic ulcer disease)    TIA (transient ischemic attack) 2006    Past Surgical History:  Procedure Laterality Date   ABDOMINAL HYSTERECTOMY     BIOPSY THYROID  Jan. 2016   LAPAROSCOPY N/A 08/25/2015   Procedure: EXPLORATORY LAPAROTOMY, ILEOCECECTOMY, PRIMARY ANASTOMOSIS, LYSIS OF ADHESIONS;  Surgeon: Autumn Messing III, MD;  Location: [facility];  Service: General;  Laterality: N/A;   laparoscopy for fertility work up     [place]  IMPLANT N/A 03/24/2014   Procedure: LOOP RECORDER IMPLANT;  Surgeon: Deboraha Sprang, MD;  Location: [facility] CATH LAB;  Service: Cardiovascular;  Laterality: N/A;   LUMBAR LAMINECTOMY/DECOMPRESSION MICRODISCECTOMY  04/25/2011   Procedure: LUMBAR LAMINECTOMY/DECOMPRESSION MICRODISCECTOMY;  Surgeon: [name], MD;  Location: [place] NEURO ORS;  Service: Neurosurgery;  Laterality: Right;  Right Lumbar Four-Five Discectomy   OOPHORECTOMY     TONSILLECTOMY      Current Outpatient Medications  Medication Sig Dispense Refill   amLODipine (NORVASC) 10 MG tablet  TAKE 1 TABLET BY MOUTH ONCE DAILY 90 tablet 1   apixaban (ELIQUIS) 5 MG TABS tablet Take 1 tablet (5 mg total) by mouth 2 (two) times daily. 60 tablet 0   atorvastatin (LIPITOR) 40 MG tablet Take 1 tablet (40 mg total) by mouth daily at 6 PM. 30 tablet 0   Coenzyme Q10 (COQ10) 100 MG CAPS Take 1 tablet by mouth daily.     losartan (COZAAR) 25 MG tablet Take 1 tablet (25 mg total) by mouth daily. 30 tablet 0   Multiple Vitamins-Minerals (VISION-VITE PRESERVE PO) Take 1 tablet by mouth 2 (two) times daily.     TURMERIC PO Take 1 tablet by mouth daily.     No current facility-administered medications for this visit.     Allergies:   Codeine and Morphine and related   Social History:  The patient  reports that she quit smoking about 14 years ago. Her smoking use included cigarettes. She has never used smokeless tobacco. She reports current alcohol use of about 2.0 standard drinks of alcohol per week. She reports that she does not use drugs.   Family History:  The patient's family history includes Cancer in her brother and sister; Colon cancer in her paternal uncle; Diabetes in her sister.  ROS:  Please see the history of present illness.  All other systems are reviewed and otherwise negative.   PHYSICAL EXAM:  VS:  BP 136/72    Pulse 61    Ht 5\' 6"  (1.676 m)    Wt 169 lb 9.6 oz (76.9 kg)    SpO2 97%    BMI 27.37 kg/m  BMI: Body mass index is 27.37 kg/m. Well nourished, well developed, in no acute distress  HEENT: normocephalic, atraumatic  Neck: no JVD, carotid bruits or masses Cardiac:  RRR; no significant murmurs, no rubs, or gallops Lungs:  CTA b/l, no wheezing, rhonchi or rales  Abd: soft, nontender, obese MS: no deformity or atrophy Ext: 1+ ankle edema  Skin: warm and dry, no rash Neuro:  No gross deficits appreciated Psych: euthymic mood, full affect  ILR site is stable, no tethering or discomfort   EKG:  Not done today   Echo 12/08/18 IMPRESSIONS 1. The left  ventricle has normal systolic function with an ejection fraction of 60-65%. The cavity size was normal. There is mildly increased left ventricular wall thickness. Left ventricular diastolic Doppler parameters are consistent with impaired  relaxation. Elevated left ventricular end-diastolic pressure. 2. The right ventricle has normal systolic function. The cavity was normal. There is no increase in right ventricular wall thickness. Right ventricular systolic pressure could not be assessed. 3. The aortic valve was not well visualized. Mild sclerosis of the aortic valve. 4. The aorta is normal unless otherwise noted.  FINDINGS Left Ventricle: The left ventricle has normal systolic function, with an ejection fraction of 60-65%. The cavity size was normal. There is mildly increased left ventricular wall thickness. Left ventricular diastolic Doppler parameters are  consistent  with impaired relaxation. Elevated left ventricular end-diastolic pressure  Right Ventricle: The right ventricle has normal systolic function. The cavity was normal. There is no increase in right ventricular wall thickness. Right ventricular systolic pressure could not be assessed.  Left Atrium: Left atrial size was normal in size.  Right Atrium: Right atrial size was normal in size.  Interatrial Septum: No atrial level shunt detected by color flow Doppler.  Pericardium: There is no evidence of pericardial effusion.  Mitral Valve: The mitral valve is normal in structure. Mitral valve regurgitation is trivial by color flow Doppler.  Tricuspid Valve: The tricuspid valve is normal in structure. Tricuspid valve regurgitation is trivial by color flow Doppler.  Aortic Valve: The aortic valve was not well visualized Mild sclerosis of the aortic valve. Aortic valve regurgitation was not visualized by color flow Doppler.  Pulmonic Valve: The pulmonic valve was normal in structure. Pulmonic valve regurgitation is not  visualized by color flow Doppler.  Aorta: The aorta is normal unless otherwise noted.  Venous: The inferior vena cava measures 1.70 cm, is normal in size with greater than 50% respiratory variability.     Coronary CT FFR 12/09/18 IMPRESSION: FFR suggests that the mid LAD stenosis may be hemodynamically significant. The lowest FFR is in the far distal LAD, so it is also possible that the low FFR is due to the cumulative effect of extensive non-critical plaque throughout the proximal and mid LAD.  Dalton Mclean  Calcium Score: 482 Agatston units.  Coronary Arteries: Left dominant with no anomalies  LM: Calcified plaque at the ostium, mild (<50%) stenosis.  LAD system: Calcified plaque proximal LAD, mild (<50%) stenosis. Calcified plaque mid LAD, probably mild (<50%) stenosis but difficult to tell with blooming artifact.  Circumflex system: Moderate ramus. Mixed plaque proximally, mild (<50%) stenosis. Calcified plaque with up to 50% stenosis in the mid ramus. Large, dominant LCx providing left PDA. Calcified plaque proximal LCx, mild <50% stenosis. Calcified plaque distal LCx, mild <50% stenosis). PDA is not well-visualized due to motion artifact but I do not think that there is significant disease.  RCA system: Small, nondominant RCA. Calcified plaque proximally, up to 50% stenosis.  IMPRESSION: 1. Coronary artery calcium score 482 Agatston units. This places the patient in the 91st percentile for age and gender, suggesting high risk for future cardiac events.  2. Extensive calcified plaque. There does not appear to be critical stenosis present in any of the vessels, but will send for FFR to confirm.      Recent Labs: 12/08/2018: ALT 16; B Natriuretic Peptide 122.5; TSH 1.717 12/09/2018: BUN 20; Creatinine, Ser 1.07; Potassium 3.6; Sodium 142 12/10/2018: Hemoglobin 12.7; Platelets 210  12/08/2018: Cholesterol 289; HDL 56; LDL Cholesterol 210; Total CHOL/HDL  Ratio 5.2; Triglycerides 117; VLDL 23   CrCl cannot be calculated (Patient's most recent lab result is older than the maximum 21 days allowed.).   Wt Readings from Last 3 Encounters:  01/28/19 169 lb 9.6 oz (76.9 kg)  01/06/19 166 lb 6.4 oz (75.5 kg)  12/10/18 161 lb 8 oz (73.3 kg)     Other studies reviewed: Additional studies/records reviewed today include: summarized above  ASSESSMENT AND PLAN:  1. Paroxysmal AFib     CHA2DS2Vasc is 3, on Eliquis, appropriately dosed      Recent hospitalization wi/RVR Nighttime palps  Our monitor RN provided the patient with sensitive skin patch/monitor, if she is unable to tolerate this she will let us know We discussed Kardia monitor, they  were familiar with the device, and her husband said that they would likely want to go this route to monitor palpitations, AF burden  Will see how she does with the Proventis monitor 1st   2. CAD     No obstructive CAD by CT     heavy calcification     No anginal symptoms     No ASA given Eliquis     No BB with h/o bradycarda     New to lipitor, has f/u labs pending for Jan 2021  3. HTN     Looks OK, no changes today  4. ILR     It bothers her, she wants it out  We discussed removal procedure, potential risks and they are agreeable to proceed. Will have her scheduled with Dr. Caryl Comes for this, and f/u wound check as usual.    Disposition: F/u with loop removal, wound check, labs Jan.  She would like to keep Dr Caryl Comes visit pending for January as well as in feb visit with gen cards in [place] office.  Current medicines are reviewed at length with the patient today.  The patient did not have any concerns regarding medicines.  Venetia Night, PA-C 01/28/2019 5:20 PM     [address] [place]  [id number] [phone] (office)  [phone] (fax)

## 2019-01-27 ENCOUNTER — Ambulatory Visit (INDEPENDENT_AMBULATORY_CARE_PROVIDER_SITE_OTHER): Payer: Medicare Other

## 2019-01-27 DIAGNOSIS — I480 Paroxysmal atrial fibrillation: Secondary | ICD-10-CM

## 2019-01-28 ENCOUNTER — Telehealth: Payer: Self-pay | Admitting: Physician Assistant

## 2019-01-28 ENCOUNTER — Other Ambulatory Visit: Payer: Self-pay

## 2019-01-28 ENCOUNTER — Ambulatory Visit: Payer: Medicare Other | Admitting: Physician Assistant

## 2019-01-28 ENCOUNTER — Encounter: Payer: Self-pay | Admitting: Physician Assistant

## 2019-01-28 VITALS — BP 136/72 | HR 61 | Ht 66.0 in | Wt 169.6 lb

## 2019-01-28 DIAGNOSIS — I480 Paroxysmal atrial fibrillation: Secondary | ICD-10-CM

## 2019-01-28 DIAGNOSIS — Z9889 Other specified postprocedural states: Secondary | ICD-10-CM | POA: Diagnosis not present

## 2019-01-28 DIAGNOSIS — I2510 Atherosclerotic heart disease of native coronary artery without angina pectoris: Secondary | ICD-10-CM

## 2019-01-28 DIAGNOSIS — I10 Essential (primary) hypertension: Secondary | ICD-10-CM | POA: Diagnosis not present

## 2019-01-28 NOTE — Patient Instructions (Signed)
Medication Instructions:   Your physician recommends that you continue on your current medications as directed. Please refer to the Current Medication list given to you today.  *If you need a refill on your cardiac medications before your next appointment, please call your pharmacy*  Lab Work:  None ordered today  Testing/Procedures:  None ordered today  Follow-Up:  On 02/23/19 at 8:00AM with Virl Axe, MD for loop removal

## 2019-01-28 NOTE — Telephone Encounter (Signed)
Husband of the patient called. The patient went to the office today for a heart monitor today. The wife has an allergic reaction to the adhesive for the regular strips to attach the monitor. The replacement strips have what like snaps and buttons. They are missing a way to bond the monitor to the body. They did not realize this until they got home from the office, because the package was sealed.  The husband would like to know what he will need to do to attach the monitor to his wife and she will not get a reaction from the adhesive. Any help is greatly appreciated

## 2019-01-28 NOTE — Telephone Encounter (Signed)
Patient was given base for sensitive skin electrodes but was not given electrodes.  Made arrangements to drop electrodes at their home 01/29/19  Approximately 7:15-7:30 PM

## 2019-02-22 NOTE — Progress Notes (Deleted)
Patient Care Team: Raina Mina., MD as PCP - General (Internal Medicine) Sueanne Margarita, MD as PCP - Cardiology (Cardiology) Deboraha Sprang, MD as PCP - Electrophysiology (Cardiology) Jovita Kussmaul, MD as Consulting Physician (General Surgery) Mauri Pole, MD as Consulting Physician (Gastroenterology)   HPI  Tiffany Wiggins is a 73 y.o. female Seen in follow-up for history of palpitations with cryptogenic stroke is status post loop recorder insertion\  10/18 atrial fibrillation was identified on her 70.  She was Rx on anticoagulation with apixaban but she has failed to take it.  Is a great deal of emotional stress.  She is having cognitive issues with concentration.  She has a great deal of worry related to her dog.  Also focused on more pastsins and dealing with guilt     Complaining of exertional chest discomfort with some sob  Had undergone cath 2015 with no obstructive disease    DATE TEST    2015 Cath   EF 70 %   4/18 Myoview   EF 60 % No ischemia              Past Medical History:  Diagnosis Date  . CVA (cerebral vascular accident) ([place]) 2019  . Diabetes mellitus type 2 in nonobese ([facility])   . Hyperlipidemia   . Hypertension    PCP GAVE FOR HER  BLOOD PRESSURE  . IBS (irritable bowel syndrome)   . PUD (peptic ulcer disease)   . TIA (transient ischemic attack) 2006    Past Surgical History:  Procedure Laterality Date  . ABDOMINAL HYSTERECTOMY    . BIOPSY THYROID  Jan. 2016  . LAPAROSCOPY N/A 08/25/2015   Procedure: EXPLORATORY LAPAROTOMY, ILEOCECECTOMY, PRIMARY ANASTOMOSIS, LYSIS OF ADHESIONS;  Surgeon: Autumn Messing III, MD;  Location: [facility];  Service: General;  Laterality: N/A;  . laparoscopy for fertility work up    . LOOP RECORDER IMPLANT N/A 03/24/2014   Procedure: LOOP RECORDER IMPLANT;  Surgeon: Deboraha Sprang, MD;  [facility] C[facility] LAB;  Service: Cardiovascular;  Laterality: N/A;  . LUMBAR LAMINECTOMY/DECOMPRESSION MICRODISCECTOMY   04/25/2011   Procedure: LUMBAR LAMINECTOMY/DECOMPRESSION MICRODISCECTOMY;  Surgeon: [name], MD;  Location: [place] NEURO ORS;  Service: Neurosurgery;  Laterality: Right;  Right Lumbar Four-Five Discectomy  . OOPHORECTOMY    . TONSILLECTOMY      Current Outpatient Medications  Medication Sig Dispense Refill  . amLODipine (NORVASC) 10 MG tablet TAKE 1 TABLET BY MOUTH ONCE DAILY 90 tablet 1  . apixaban (ELIQUIS) 5 MG TABS tablet Take 1 tablet (5 mg total) by mouth 2 (two) times daily. 60 tablet 0  . atorvastatin (LIPITOR) 40 MG tablet Take 1 tablet (40 mg total) by mouth daily at 6 PM. 30 tablet 0  . Coenzyme Q10 (COQ10) 100 MG CAPS Take 1 tablet by mouth daily.    Marland Kitchen losartan (COZAAR) 25 MG tablet Take 1 tablet (25 mg total) by mouth daily. 30 tablet 0  . Multiple Vitamins-Minerals (VISION-VITE PRESERVE PO) Take 1 tablet by mouth 2 (two) times daily.    . TURMERIC PO Take 1 tablet by mouth daily.     No current facility-administered medications for this visit.     Allergies  Allergen Reactions  . Codeine Nausea Only  . Morphine And Related Itching    Itching       Review of Systems negative except from HPI and PMH  Physical Exam There were no vitals taken for this  visit. Well developed and well nourished in no acute distress HENT normal E scleral and icterus clear Neck Supple JVP flat; carotids brisk and full B Bruits R>L  Clear to ausculation  Regular rate and rhythm, 2/6 gallops or rub Soft with active bowel sounds No clubbing cyanosis  Edema Alert and oriented, grossly normal motor and sensory function Skin Warm and Dry tearful  ECG Personally reviewed from 6 November demonstrated sinus rhythm at 51  Assessment and  Plan Cryptogenic stroke  Implantable loop recorder  Hypertension  Sinus bradycardia  Chest discomfort  Exertional  Carotid bruits   Atrial fibrillation   Emotional Lability  Cramps

## 2019-02-23 ENCOUNTER — Ambulatory Visit: Payer: Medicare Other | Admitting: Internal Medicine

## 2019-03-05 ENCOUNTER — Telehealth: Payer: Self-pay

## 2019-03-05 NOTE — Telephone Encounter (Signed)
Pt aware of monitor results. She verbalized understanding and had no additional questions.

## 2019-03-30 NOTE — Progress Notes (Signed)
Patient Care Team: Raina Mina., MD as PCP - General (Internal Medicine) Sueanne Margarita, MD as PCP - Cardiology (Cardiology) Deboraha Sprang, MD as PCP - Electrophysiology (Cardiology) Jovita Kussmaul, MD as Consulting Physician (General Surgery) Mauri Pole, MD as Consulting Physician (Gastroenterology)   HPI  Tiffany Wiggins is a 73 y.o. female Seen in follow-up for history of palpitations with cryptogenic stroke is status post loop recorder insertion\  10/18 atrial fibrillation was identified on her 33.  She was Rx on anticoagulation with apixaban but she has failed to take it.  The patient denies chest pain, shortness of breath, nocturnal dyspnea, orthopnea or peripheral edema.  There have been no palpitations, lightheadedness or syncope.        DATE TEST    2015 Cath   EF 70 %   4/18 Myoview   EF 60 % No ischemia              Past Medical History:  Diagnosis Date  . CVA (cerebral vascular accident) ([place]) 2019  . Diabetes mellitus type 2 in nonobese ([facility])   . Hyperlipidemia   . Hypertension    PCP GAVE FOR HER  BLOOD PRESSURE  . IBS (irritable bowel syndrome)   . PUD (peptic ulcer disease)   . TIA (transient ischemic attack) 2006    Past Surgical History:  Procedure Laterality Date  . ABDOMINAL HYSTERECTOMY    . BIOPSY THYROID  Jan. 2016  . LAPAROSCOPY N/A 08/25/2015   Procedure: EXPLORATORY LAPAROTOMY, ILEOCECECTOMY, PRIMARY ANASTOMOSIS, LYSIS OF ADHESIONS;  Surgeon: Autumn Messing III, MD;  Location: [facility];  Service: General;  Laterality: N/A;  . laparoscopy for fertility work up    . LOOP RECORDER IMPLANT N/A 03/24/2014   Procedure: LOOP RECORDER IMPLANT;  Surgeon: Deboraha Sprang, MD;  Location: [facility] CATH LAB;  Service: Cardiovascular;  Laterality: N/A;  . LUMBAR LAMINECTOMY/DECOMPRESSION MICRODISCECTOMY  04/25/2011   Procedure: LUMBAR LAMINECTOMY/DECOMPRESSION MICRODISCECTOMY;  Surgeon: [name], MD;  Location: [place] NEURO ORS;  Service:  Neurosurgery;  Laterality: Right;  Right Lumbar Four-Five Discectomy  . OOPHORECTOMY    . TONSILLECTOMY      Current Outpatient Medications  Medication Sig Dispense Refill  . amLODipine (NORVASC) 10 MG tablet TAKE 1 TABLET BY MOUTH ONCE DAILY 90 tablet 1  . apixaban (ELIQUIS) 5 MG TABS tablet Take 1 tablet (5 mg total) by mouth 2 (two) times daily. 60 tablet 0  . atorvastatin (LIPITOR) 40 MG tablet Take 1 tablet (40 mg total) by mouth daily at 6 PM. 30 tablet 0  . Coenzyme Q10 (COQ10) 100 MG CAPS Take 1 tablet by mouth daily.    Marland Kitchen losartan (COZAAR) 25 MG tablet Take 1 tablet (25 mg total) by mouth daily. 30 tablet 0  . Multiple Vitamins-Minerals (VISION-VITE PRESERVE PO) Take 1 tablet by mouth 2 (two) times daily.    . TURMERIC PO Take 1 tablet by mouth daily.     No current facility-administered medications for this visit.    Allergies  Allergen Reactions  . Codeine Nausea Only  . Morphine And Related Itching    Itching       Review of Systems negative except from HPI and PMH  Physical Exam BP 136/64   Pulse (!) 47   Ht 5\' 6"  (1.676 m)   Wt 170 lb (77.1 kg)   SpO2 98%   BMI 27.44 kg/m  Well developed and well nourished in no acute  distress HENT normal E scleral and icterus clear Neck Supple JVP flat; carotids brisk and full B Bruits R>L  Clear to ausculation  Regular rate and rhythm, 2/6 gallops or rub Soft with active bowel sounds No clubbing cyanosis  Edema Alert and oriented, grossly normal motor and sensory function Skin Warm and Dry tearful  ECG Personally reviewed from 6 November demonstrated sinus rhythm at 51  Assessment and  Plan Cryptogenic stroke  Implantable loop recorder  Hypertension  Sinus bradycardia  Chest discomfort  Exertional  Carotid bruits   Atrial fibrillation   Emotional Lability  Cramps    Here for linq removal  TRULA BRESSETTE [id number]  [id number]  Preop [id number] Stroke  Postop Dx same/   Procedure:loop  explant   Local anesthesia--the device was retrieved  Benzoin steri strip dressing appled  Cx: None     Virl Axe, MD 03/31/2019 8:37 AM  She returned this afternoon at 1500 with concerns about saturation of her bandage.  The Tegaderm dressing was removed.  The Steri-Strips were intact.  Another benzoin Steri-Strip layer was applied.  A new Tegaderm dressing was applied.

## 2019-03-31 ENCOUNTER — Telehealth: Payer: Self-pay

## 2019-03-31 ENCOUNTER — Ambulatory Visit: Payer: Medicare Other | Admitting: Internal Medicine

## 2019-03-31 ENCOUNTER — Encounter: Payer: Self-pay | Admitting: Internal Medicine

## 2019-03-31 ENCOUNTER — Other Ambulatory Visit: Payer: Self-pay

## 2019-03-31 VITALS — BP 136/64 | HR 47 | Ht 66.0 in | Wt 170.0 lb

## 2019-03-31 DIAGNOSIS — Z4509 Encounter for adjustment and management of other cardiac device: Secondary | ICD-10-CM

## 2019-03-31 DIAGNOSIS — I480 Paroxysmal atrial fibrillation: Secondary | ICD-10-CM

## 2019-03-31 NOTE — Telephone Encounter (Signed)
I spoke with the pt and her husband. They both states the top bandage is covered with blood and asked me can they remove the top dressing? I called Chanetta Marshall and she states it is okay for them to take the top dressing off. If the wound is still bleeding to hold pressure for 15 minutes. I told them the bottom dressing, the steri strips have to stay on until the wound check appointment. The pt states there is a plastic covering over the top layer of gauze and when they try to take the gauze off, the bottom layer tries to come off. They asked can they come back in to let Caryl Comes look at it again. I told them the nurse states the top guaze can come off and they should hold pressure for 15 minutes to stop the bleeding. If that do not work then they can come back for Dr. Caryl Comes to redress the wound. The pt and her husband states they think it is best to come back and let Caryl Comes redress the wound and they will be here after 3 pm.

## 2019-03-31 NOTE — Patient Instructions (Addendum)
Medication Instructions:  Your physician recommends that you continue on your current medications as directed. Please refer to the Current Medication list given to you today.  Labwork: None ordered.  Testing/Procedures: None ordered.  Follow-Up: Your physician wants you to follow-up in: 7-10 days for a wound check with device clinic.  You have a virtual visit scheduled for 04/14/2019 at 2pm.  Your physician wants you to follow-up in: 6 months.  We will send you a reminder in the mail to schedule this visit.    Implantable Loop Recorder Placement, Care After Refer to this sheet in the next few weeks. These instructions provide you with information about caring for yourself after your procedure. Your health care provider may also give you more specific instructions. Your treatment has been planned according to current medical practices, but problems sometimes occur. Call your health care provider if you have any problems or questions after your procedure. What can I expect after the procedure? After the procedure, it is common to have:  Soreness or pain near the cut from surgery (incision).  Some swelling or bruising near the incision.  Follow these instructions at home:  Medicines  Take over-the-counter and prescription medicines only as told by your health care provider.  If you were prescribed an antibiotic medicine, take it as told by your health care provider. Do not stop taking the antibiotic even if you start to feel better.  Bathing Do not take baths, swim, or use a hot tub until your health care provider approves. You may shower 24 hours after removal of your monitor.  Incision care  Follow instructions from your health care provider about how to take care of your incision. Make sure you: ? Remove your top dressing after 24 hours (before you shower) ? Leave stitches (sutures), skin glue, or adhesive strips in place. These skin closures may need to stay in place for 2  weeks or longer. If adhesive strip edges start to loosen and curl up, you may trim the loose edges. Do not remove adhesive strips completely unless your health care provider tells you to do that.  Check your incision area every day for signs of infection. Check for: ? More redness, swelling, or pain. ? Fluid or blood. ? Warmth. ? Pus or a bad smell.  Contact a health care provider if:  You have more redness, swelling, or pain around your incision.  You have more fluid or blood coming from your incision.  Your incision feels warm to the touch.  You have pus or a bad smell coming from your incision.  You have a fever.  You have pain that is not relieved by your pain medicine.  You have triggered your device because of fainting (syncope) or because of a heartbeat that feels like it is racing, slow, fluttering, or skipping (palpitations).

## 2019-04-02 ENCOUNTER — Telehealth: Payer: Self-pay | Admitting: Internal Medicine

## 2019-04-02 NOTE — Telephone Encounter (Signed)
Left detailed message for Pt's husband.  Advised that Pt should only have steristrips over her wound at this time.  Advised if she still had the gauze and tegaderm these need to be removed.  Advised the steristrips need to stay in place until her wound check.    Advised if she had any redness or itching around the steripstrips she could use some benadryl cream. Advised if the reaction was blisters or worse to call office on call for further instructions.

## 2019-04-02 NOTE — Telephone Encounter (Signed)
Husband of patient called. Patient is starting to have a reaction to the adhesive from the bandage from her loop recorder removal. He wanted to know what to do if she has a reaction to the adhesive

## 2019-04-06 NOTE — Telephone Encounter (Signed)
Addressed in office

## 2019-04-08 ENCOUNTER — Other Ambulatory Visit: Payer: Medicare Other

## 2019-04-13 ENCOUNTER — Other Ambulatory Visit: Payer: Self-pay

## 2019-04-13 ENCOUNTER — Encounter: Payer: Self-pay | Admitting: Internal Medicine

## 2019-04-13 ENCOUNTER — Ambulatory Visit: Payer: Medicare Other | Admitting: Internal Medicine

## 2019-04-13 VITALS — BP 138/76 | HR 54 | Ht 66.0 in | Wt 170.4 lb

## 2019-04-13 DIAGNOSIS — I6523 Occlusion and stenosis of bilateral carotid arteries: Secondary | ICD-10-CM

## 2019-04-13 DIAGNOSIS — Z9889 Other specified postprocedural states: Secondary | ICD-10-CM

## 2019-04-13 DIAGNOSIS — I639 Cerebral infarction, unspecified: Secondary | ICD-10-CM

## 2019-04-13 DIAGNOSIS — I10 Essential (primary) hypertension: Secondary | ICD-10-CM

## 2019-04-13 MED ORDER — PROPRANOLOL HCL 10 MG PO TABS: 10.0000 mg | ORAL_TABLET | ORAL | 2 refills | Status: DC | PRN

## 2019-04-13 NOTE — Patient Instructions (Signed)
Medication Instructions:  Your physician has recommended you make the following change in your medication:   Begin taking Propranolol 10mg  by mouth every 4 hours as needed.  *If you need a refill on your cardiac medications before your next appointment, please call your pharmacy*  Lab Work: None ordered.  If you have labs (blood work) drawn today and your tests are completely normal, you will receive your results only by: Marland Kitchen MyChart Message (if you have MyChart) OR . A paper copy in the mail If you have any lab test that is abnormal or we need to change your treatment, we will call you to review the results.  Testing/Procedures: None ordered.   Follow-Up: At [facility], you and your health needs are our priority.  As part of our continuing mission to provide you with exceptional heart care, we have created designated Provider Care Teams.  These Care Teams include your primary Cardiologist (physician) and Advanced Practice Providers (APPs -  Physician Assistants and Nurse Practitioners) who all work together to provide you with the care you need, when you need it.  Your next appointment: Your physician recommends that you schedule a follow-up appointment in: one year

## 2019-04-13 NOTE — Progress Notes (Signed)
Patient Care Team: Raina Mina., MD as PCP - General (Internal Medicine) Sueanne Margarita, MD as PCP - Cardiology (Cardiology) Deboraha Sprang, MD as PCP - Electrophysiology (Cardiology) Jovita Kussmaul, MD as Consulting Physician (General Surgery) Mauri Pole, MD as Consulting Physician (Gastroenterology)   HPI  Tiffany Wiggins is a 74 y.o. female Seen in follow-up for history of palpitations with cryptogenic stroke is status post loop recorder insertion\  10/18 atrial fibrillation was identified on her 51.  She was Rx on anticoagulation with apixaban   9/20 ended up in the hospital because of atrial fibrillation heart rate 115 associated with chest pain.  EMS called.  Had reverted to sinus rhythm by the time of their arrival.  Was admitted with borderline troponins.  Underwent CTA.  FFR 0.8 in the proximal vessel 0.6 in the distal LAD.  Thought likely related to hemodynamic stuttering down the LAD.  Is currently on a statin.    Date Cr K Hgb  9/20 1.07 3.6 12.7            DATE TEST    2015 Cath   EF 70 %   4/18 Myoview   EF 60 % No ischemia  9/20 CTA  FFR LCx 0.82//RCA 0.88          Past Medical History:  Diagnosis Date  . CVA (cerebral vascular accident) ([place]) 2019  . Diabetes mellitus type 2 in nonobese (HCC)   . Hyperlipidemia   . Hypertension    PCP GAVE FOR HER  BLOOD PRESSURE  . IBS (irritable bowel syndrome)   . PUD (peptic ulcer disease)   . TIA (transient ischemic attack) 2006    Past Surgical History:  Procedure Laterality Date  . ABDOMINAL HYSTERECTOMY    . BIOPSY THYROID  Jan. 2016  . LAPAROSCOPY N/A 08/25/2015   Procedure: EXPLORATORY LAPAROTOMY, ILEOCECECTOMY, PRIMARY ANASTOMOSIS, LYSIS OF ADHESIONS;  Surgeon: Autumn Messing III, MD;  Location: [facility] ORS;  Service: General;  Laterality: N/A;  . laparoscopy for fertility work up    . LOOP RECORDER IMPLANT N/A 03/24/2014   Procedure: LOOP RECORDER IMPLANT;  Surgeon: Deboraha Sprang, MD;   Location: [facility] CATH LAB;  Service: Cardiovascular;  Laterality: N/A;  . LUMBAR LAMINECTOMY/DECOMPRESSION MICRODISCECTOMY  04/25/2011   Procedure: LUMBAR LAMINECTOMY/DECOMPRESSION MICRODISCECTOMY;  Surgeon: [name], MD;  Location: [place] NEURO ORS;  Service: Neurosurgery;  Laterality: Right;  Right Lumbar Four-Five Discectomy  . OOPHORECTOMY    . TONSILLECTOMY      Current Outpatient Medications  Medication Sig Dispense Refill  . amLODipine (NORVASC) 10 MG tablet TAKE 1 TABLET BY MOUTH ONCE DAILY 90 tablet 1  . apixaban (ELIQUIS) 5 MG TABS tablet Take 1 tablet (5 mg total) by mouth 2 (two) times daily. 60 tablet 0  . atorvastatin (LIPITOR) 40 MG tablet Take 1 tablet (40 mg total) by mouth daily at 6 PM. 30 tablet 0  . Coenzyme Q10 (COQ10) 100 MG CAPS Take 1 tablet by mouth daily.    Marland Kitchen losartan (COZAAR) 25 MG tablet Take 1 tablet (25 mg total) by mouth daily. 30 tablet 0  . Multiple Vitamins-Minerals (VISION-VITE PRESERVE PO) Take 1 tablet by mouth 2 (two) times daily.    . TURMERIC PO Take 1 tablet by mouth daily.     No current facility-administered medications for this visit.    Allergies  Allergen Reactions  . Codeine Nausea Only  . Morphine And Related Itching  Itching       Review of Systems negative except from HPI and PMH  Physical Exam BP 138/76   Pulse (!) 54   Ht 5\' 6"  (1.676 m)   Wt 170 lb 6.4 oz (77.3 kg)   SpO2 98%   BMI 27.50 kg/m  Well developed and well nourished in no acute distress HENT normal E scleral and icterus clear Neck Supple JVP flat;   Clear to ausculation  Regular rate and rhythm, 2/6 gallops or rub Soft with active bowel sounds No clubbing cyanosis  Edema Alert and oriented, grossly normal motor and sensory function Skin Warm and Dry tearful  ECG    Assessment and  Plan Cryptogenic stroke  Implantable loop recorder  Hypertension  Sinus bradycardia  Chest discomfort  Exertional  Carotid bruits   Atrial  fibrillation-recurrent  Emotional Lability    Lengthy discussion regarding the likelihood of atrial fibrillation recurring.  Associated with a rapid rate.  She has sinus bradycardia so I given her propranolol 10 mg to take as needed for recurrence of atrial fibrillation.  Hopefully this will obviate the need to go to the emergency room.  We reviewed the EMS records together, the CT and FFR and lab data.  On Anticoagulation;  No bleeding issues   We spent more than 50% of our >30 min visit in face to face counseling regarding the above

## 2019-04-14 ENCOUNTER — Ambulatory Visit: Payer: Medicare Other

## 2019-09-11 ENCOUNTER — Ambulatory Visit (HOSPITAL_COMMUNITY)
Admission: RE | Admit: 2019-09-11 | Payer: Medicare Other | Source: Ambulatory Visit | Attending: Internal Medicine | Admitting: Internal Medicine

## 2019-09-25 ENCOUNTER — Ambulatory Visit (HOSPITAL_COMMUNITY)
Admission: RE | Admit: 2019-09-25 | Discharge: 2019-09-25 | Disposition: A | Discharge status: Home / Self Care | Payer: Medicare Other | Source: Ambulatory Visit | Attending: Cardiology | Admitting: Cardiology

## 2019-09-25 ENCOUNTER — Other Ambulatory Visit: Payer: Self-pay

## 2019-09-25 DIAGNOSIS — I6523 Occlusion and stenosis of bilateral carotid arteries: Secondary | ICD-10-CM | POA: Diagnosis not present

## 2019-10-02 ENCOUNTER — Telehealth: Payer: Self-pay | Admitting: Internal Medicine

## 2019-10-02 NOTE — Telephone Encounter (Signed)
New Message   1. Are you calling in reference to your FMLA or disability form? No  2. What is your question in regards to FMLA or disability form? No   3. Do you need copies of your medical records? No  4. Are you waiting on a nurse to call you back with results or are you wanting copies of your results? Yes, copy of results for Carotid Imaging faxed to Dr. Gilford Rile at [phone].    Please route to Medical Records or your medical records site representative

## 2019-10-08 ENCOUNTER — Telehealth: Payer: Self-pay | Admitting: Internal Medicine

## 2019-10-08 ENCOUNTER — Ambulatory Visit: Payer: Medicare Other | Admitting: Internal Medicine

## 2019-10-08 NOTE — Telephone Encounter (Signed)
Follow Up  Patient is returning call. Please give patient's husband a call back.

## 2019-10-08 NOTE — Telephone Encounter (Signed)
Attempted return phone call.  Left voicemail message to contact RN at [phone].

## 2019-10-08 NOTE — Telephone Encounter (Signed)
    Pt's husband calling, he said he has some concerns he would like to discuss with DR. Caryl Comes or his nurse

## 2019-10-09 ENCOUNTER — Telehealth: Payer: Self-pay | Admitting: Internal Medicine

## 2019-10-09 NOTE — Telephone Encounter (Signed)
Transferred call to Marsha  

## 2019-10-09 NOTE — Telephone Encounter (Signed)
Spoke with pt's husband, DPR.  Husband states pt is doing well and had some general questions but they have decided questions are more appropriate to be addressed by PCP and they will do that.  Pt has appointment scheduled for 01/01/2020 to see Dr Caryl Comes.  Advised pt's husband to call anytime with questions.  He verbalized understanding and thanked Therapist, sports for time.

## 2019-10-09 NOTE — Telephone Encounter (Signed)
Attempted phone call.  Left voicemail message to contact RN at [phone]. 

## 2019-12-31 DIAGNOSIS — Z9889 Other specified postprocedural states: Secondary | ICD-10-CM | POA: Insufficient documentation

## 2020-01-01 ENCOUNTER — Other Ambulatory Visit: Payer: Self-pay

## 2020-01-01 ENCOUNTER — Ambulatory Visit (INDEPENDENT_AMBULATORY_CARE_PROVIDER_SITE_OTHER): Payer: Medicare Other | Admitting: Internal Medicine

## 2020-01-01 ENCOUNTER — Encounter: Payer: Self-pay | Admitting: Internal Medicine

## 2020-01-01 ENCOUNTER — Encounter: Payer: Self-pay | Admitting: *Deleted

## 2020-01-01 VITALS — BP 152/68 | HR 45 | Ht 66.0 in | Wt 172.6 lb

## 2020-01-01 DIAGNOSIS — R002 Palpitations: Secondary | ICD-10-CM | POA: Diagnosis not present

## 2020-01-01 DIAGNOSIS — Z9889 Other specified postprocedural states: Secondary | ICD-10-CM

## 2020-01-01 DIAGNOSIS — I480 Paroxysmal atrial fibrillation: Secondary | ICD-10-CM | POA: Diagnosis not present

## 2020-01-01 MED ORDER — ISOSORBIDE MONONITRATE ER 30 MG PO TB24: 30.0000 mg | ORAL_TABLET | Freq: Every day | ORAL | 3 refills | Status: DC

## 2020-01-01 MED ORDER — AMLODIPINE BESYLATE 2.5 MG PO TABS: 2.5000 mg | ORAL_TABLET | Freq: Every day | ORAL | 3 refills | Status: DC

## 2020-01-01 MED ORDER — ROSUVASTATIN CALCIUM 10 MG PO TABS: 10.0000 mg | ORAL_TABLET | Freq: Every day | ORAL | 3 refills | Status: DC

## 2020-01-01 MED ORDER — LOSARTAN POTASSIUM 50 MG PO TABS: 50.0000 mg | ORAL_TABLET | Freq: Every day | ORAL | 3 refills | Status: DC

## 2020-01-01 NOTE — Progress Notes (Signed)
Patient ID: Tiffany Wiggins, female   DOB: Aug 04, 1945, 74 y.o.   MRN: [id number] Patient enrolled for [employer] to ship a 14 day ZIO XT long term holter monitor to her home.

## 2020-01-01 NOTE — Progress Notes (Signed)
Patient Care Team: Raina Mina., MD as PCP - General (Internal Medicine) Sueanne Margarita, MD as PCP - Cardiology (Cardiology) Deboraha Sprang, MD as PCP - Electrophysiology (Cardiology) Jovita Kussmaul, MD as Consulting Physician (General Surgery) Mauri Pole, MD as Consulting Physician (Gastroenterology)   HPI  Tiffany Wiggins is a 74 y.o. female Seen in follow-up for history of palpitations with cryptogenic stroke is status post loop recorder insertion\  10/18 atrial fibrillation was identified on her 60.  She was Rx on anticoagulation with apixaban   9/20 ended up in the hospital because of atrial fibrillation heart rate 115 associated with chest pain.  EMS called.  Had reverted to sinus rhythm by the time of their arrival.  Was admitted with borderline troponins.  Underwent CTA.  FFR 0.8 in the proximal vessel 0.6 in the distal LAD.  Thought likely related to hemodynamic stuttering down the LAD.  Is currently on a statin.    Date Cr K LDL Hgb  9/20 1.07 3.6 211 12.7   7/21 1.21 4.2 123 12.4      DATE TEST    2015 Cath   EF 70 %   4/18 Myoview   EF 60 % No ischemia  9/20 CTA  FFR LCx 0.82//RCA 0.88          Past Medical History:  Diagnosis Date  . CVA (cerebral vascular accident) ([place]) 2019  . Diabetes mellitus type 2 in nonobese (HCC)   . Hyperlipidemia   . Hypertension    PCP GAVE FOR HER  BLOOD PRESSURE  . IBS (irritable bowel syndrome)   . PUD (peptic ulcer disease)   . TIA (transient ischemic attack) 2006    Past Surgical History:  Procedure Laterality Date  . ABDOMINAL HYSTERECTOMY    . BIOPSY THYROID  Jan. 2016  . LAPAROSCOPY N/A 08/25/2015   Procedure: EXPLORATORY LAPAROTOMY, ILEOCECECTOMY, PRIMARY ANASTOMOSIS, LYSIS OF ADHESIONS;  Surgeon: Autumn Messing III, MD;  Location: [facility] ORS;  Service: General;  Laterality: N/A;  . laparoscopy for fertility work up    . LOOP RECORDER IMPLANT N/A 03/24/2014   Procedure: LOOP RECORDER IMPLANT;   Surgeon: Deboraha Sprang, MD;  Location: [facility] CATH LAB;  Service: Cardiovascular;  Laterality: N/A;  . LUMBAR LAMINECTOMY/DECOMPRESSION MICRODISCECTOMY  04/25/2011   Procedure: LUMBAR LAMINECTOMY/DECOMPRESSION MICRODISCECTOMY;  Surgeon: [name], MD;  Location: [place] NEURO ORS;  Service: Neurosurgery;  Laterality: Right;  Right Lumbar Four-Five Discectomy  . OOPHORECTOMY    . TONSILLECTOMY      Current Outpatient Medications  Medication Sig Dispense Refill  . apixaban (ELIQUIS) 5 MG TABS tablet Take 1 tablet (5 mg total) by mouth 2 (two) times daily. 60 tablet 0  . Coenzyme Q10 (COQ10) 100 MG CAPS Take 1 tablet by mouth daily.    . Multiple Vitamins-Minerals (VISION-VITE PRESERVE PO) Take 1 tablet by mouth 2 (two) times daily.    . propranolol (INDERAL) 10 MG tablet Take 1 tablet (10 mg total) by mouth every 4 (four) hours as needed. 60 tablet 2  . TURMERIC PO Take 1 tablet by mouth daily.    Marland Kitchen amLODipine (NORVASC) 2.5 MG tablet Take 1 tablet (2.5 mg total) by mouth daily. 90 tablet 3  . isosorbide mononitrate (IMDUR) 30 MG 24 hr tablet Take 1 tablet (30 mg total) by mouth daily. 90 tablet 3  . losartan (COZAAR) 50 MG tablet Take 1 tablet (50 mg total) by mouth daily. 90 tablet  3  . rosuvastatin (CRESTOR) 10 MG tablet Take 1 tablet (10 mg total) by mouth daily. 90 tablet 3   No current facility-administered medications for this visit.    Allergies  Allergen Reactions  . Codeine Nausea Only  . Morphine And Related Itching    Itching       Review of Systems negative except from HPI and PMH  Physical Exam BP (!) 152/68   Pulse (!) 45   Ht 5\' 6"  (1.676 m)   Wt 172 lb 9.6 oz (78.3 kg)   SpO2 98%   BMI 27.86 kg/m  Well developed and nourished in no acute distress HENT normal Neck supple with JVP-  flat   Clear Regular rate and rhythm, no murmurs or gallops Abd-soft with active BS No Clubbing cyanosis edema Skin-warm and dry A & Oriented  Grossly normal sensory and motor  function  ECG sinus at 45 Intervals 14/08/47     Assessment and  Plan Cryptogenic stroke  Implantable loop recorder at RRT  Hypertension  Sinus bradycardia  Chest discomfort  Exertional  Carotid bruits   Atrial fibrillation-recurrent  Emotional Lability memory loss   Patient's blood pressure remains poorly controlled.  We will increase losartan from 25--50 mg.  Moreover, has edema.  Wonder whether it is related to amlodipine.  We will decrease 10--5 mg.  Exertional chest discomfort with abnormal CTA with positive FFR.  We will begin more aggressive therapy by adding isosorbide mononitrate 30 mg every morning.  This may also help her blood pressure.  Cholesterol is not at goal.  We will increase her Crestor from 5--10 although we have come a long way with her LDL coming from greater than 200 to now just 120s  Concerning related to memory loss.

## 2020-01-01 NOTE — Patient Instructions (Signed)
Medication Instructions:  Your physician has recommended you make the following change in your medication:   ** Begin taking Norvasc (Amlodipine) 2.5mg  - 1 tablet by mouth daily  ** Begin Losartan 50mg  - 1 tablet by mouth daily   **Begin Crestor 10mg  - 1 tablet by mouth daily  Begin Imdur 30mg  - 1 tablet by mouth every morning   *If you need a refill on your cardiac medications before your next appointment, please call your pharmacy*   Lab Work: None ordered.  If you have labs (blood work) drawn today and your tests are completely normal, you will receive your results only by: Marland Kitchen MyChart Message (if you have MyChart) OR . A paper copy in the mail If you have any lab test that is abnormal or we need to change your treatment, we will call you to review the results.   Testing/Procedures: [place] [place]- Long Term Monitor Instructions   Your physician has requested you wear your ZIO patch monitor___14____days.   This is a single patch monitor.  [employer] supplies one patch monitor per enrollment.  Additional stickers are not available.   Please do not apply patch if you will be having a Nuclear Stress Test, Echocardiogram, Cardiac CT, MRI, or Chest Xray during the time frame you would be wearing the monitor. The patch cannot be worn during these tests.  You cannot remove and re-apply the ZIO XT patch monitor.   Your ZIO patch monitor will be sent USPS Priority mail from [facility] directly to your home address. The monitor may also be mailed to a PO BOX if home delivery is not available.   It may take 3-5 days to receive your monitor after you have been enrolled.   Once you have received you monitor, please review enclosed instructions.  Your monitor has already been registered assigning a specific monitor serial # to you.   Applying the monitor   Shave hair from upper left chest.   Hold abrader disc by orange tab.  Rub abrader in 40 strokes over left upper chest as indicated in  your monitor instructions.   Clean area with 4 enclosed alcohol pads .  Use all pads to assure are is cleaned thoroughly.  Let dry.   Apply patch as indicated in monitor instructions.  Patch will be place under collarbone on left side of chest with arrow pointing upward.   Rub patch adhesive wings for 2 minutes.Remove white label marked "1".  Remove white label marked "2".  Rub patch adhesive wings for 2 additional minutes.   While looking in a mirror, press and release button in center of patch.  A small green light will flash 3-4 times .  This will be your only indicator the monitor has been turned on.     Do not shower for the first 24 hours.  You may shower after the first 24 hours.   Press button if you feel a symptom. You will hear a small click.  Record Date, Time and Symptom in the Patient Log Book.   When you are ready to remove patch, follow instructions on last 2 pages of Patient Log Book.  Stick patch monitor onto last page of Patient Log Book.   Place Patient Log Book in [place] box.  Use locking tab on box and tape box closed securely.  The [employer] has [employer] on it.  Please place in mailbox as soon as possible.  Your physician should have your test results approximately  7 days after the monitor has been mailed back to [place].   Call [place] at [phone] if you have questions regarding your ZIO XT patch monitor.  Call them immediately if you see an orange light blinking on your monitor.   If your monitor falls off in less than 4 days contact our Monitor department at [phone].  If your monitor becomes loose or falls off after 4 days call [employer] at [phone] for suggestions on securing your monitor.     Follow-Up: At [facility], you and your health needs are our priority.  As part of our continuing mission to provide you with exceptional heart care, we have created designated Provider Care Teams.  These Care Teams  include your primary Cardiologist (physician) and Advanced Practice Providers (APPs -  Physician Assistants and Nurse Practitioners) who all work together to provide you with the care you need, when you need it.  We recommend signing up for the patient portal called "MyChart".  Sign up information is provided on this After Visit Summary.  MyChart is used to connect with patients for Virtual Visits (Telemedicine).  Patients are able to view lab/test results, encounter notes, upcoming appointments, etc.  Non-urgent messages can be sent to your provider as well.   To learn more about what you can do with MyChart, go to [email or web address].    Your next appointment:   6 months with Dr Caryl Comes or APP

## 2020-01-12 ENCOUNTER — Telehealth: Payer: Self-pay | Admitting: Internal Medicine

## 2020-01-12 NOTE — Telephone Encounter (Signed)
Spoke with [place] from [employer]. She states that the patient is having trouble with the heart monitor sticking. She states that there is not even 8 days worth of data on the heart monitor. A new one is being shipped to her house and she states that the patient would like to come into the office and have one of our staff put it on her. Please advise.    Patient's contact: [phone]

## 2020-01-13 NOTE — Telephone Encounter (Signed)
Spoke to patient and husband. They had issues with applying the monitor. Patient and husband will come into office on Monday 10/18 to have a Zio monitor applied.   Side note- patient states she has sensitive skin. I explained the different types of monitors we offer and asked if she would like me to ask Caryl Comes if a preventice with sensitive skin electrodes would be ok. Patient declined and would like to try the Zio first.

## 2020-01-18 ENCOUNTER — Ambulatory Visit (INDEPENDENT_AMBULATORY_CARE_PROVIDER_SITE_OTHER): Payer: Medicare Other

## 2020-01-18 ENCOUNTER — Other Ambulatory Visit: Payer: Self-pay

## 2020-01-18 ENCOUNTER — Encounter: Payer: Self-pay | Admitting: *Deleted

## 2020-01-18 DIAGNOSIS — R002 Palpitations: Secondary | ICD-10-CM | POA: Diagnosis not present

## 2020-02-11 DIAGNOSIS — R0602 Shortness of breath: Secondary | ICD-10-CM

## 2020-02-11 DIAGNOSIS — R001 Bradycardia, unspecified: Secondary | ICD-10-CM

## 2020-02-12 ENCOUNTER — Telehealth: Payer: Self-pay

## 2020-02-12 NOTE — Telephone Encounter (Signed)
Attempted phone call to pt to discuss monitor results per Dr Caryl Comes.  Left voice mail message to contact RN at [phone].   Per Dr Caryl Comes Findings HR  avg 22  Min 38-Max 105  PVCs Rare, less than 1%   PACs Rare, less than 1%   SVT Nonsustained 13 episodes; fastest 222 bpm for 5 beats; longest for 15.2 sec at 96 bpm   Recommendations  Her HR is impressively slow and limited  We should arrange a GXT ( w me) for chronotropic incompetence, that is tosee  If we were to improve her HR could we improve her exercise capacity

## 2020-03-04 NOTE — Telephone Encounter (Signed)
Order placed for GXT per Dr Caryl Comes

## 2020-03-08 ENCOUNTER — Other Ambulatory Visit: Payer: Self-pay | Admitting: Internal Medicine

## 2020-03-08 DIAGNOSIS — Z1231 Encounter for screening mammogram for malignant neoplasm of breast: Secondary | ICD-10-CM

## 2020-04-15 ENCOUNTER — Other Ambulatory Visit (HOSPITAL_COMMUNITY)
Admission: RE | Admit: 2020-04-15 | Discharge: 2020-04-15 | Disposition: A | Discharge status: Home / Self Care | Payer: Medicare Other | Source: Ambulatory Visit | Attending: Internal Medicine | Admitting: Internal Medicine

## 2020-04-15 ENCOUNTER — Other Ambulatory Visit: Payer: Self-pay

## 2020-04-15 DIAGNOSIS — Z01812 Encounter for preprocedural laboratory examination: Secondary | ICD-10-CM | POA: Diagnosis present

## 2020-04-15 DIAGNOSIS — R002 Palpitations: Secondary | ICD-10-CM

## 2020-04-15 DIAGNOSIS — Z20822 Contact with and (suspected) exposure to covid-19: Secondary | ICD-10-CM | POA: Diagnosis not present

## 2020-04-15 NOTE — Progress Notes (Signed)
Attestation order entered as pt is scheduled for GXT 04/19/2020.  Routed to Dr Caryl Comes for review and sign.

## 2020-04-16 LAB — SARS CORONAVIRUS 2 (TAT 6-24 HRS): SARS Coronavirus 2: NEGATIVE

## 2020-04-18 ENCOUNTER — Telehealth: Payer: Self-pay | Admitting: *Deleted

## 2020-04-18 NOTE — Telephone Encounter (Signed)
  GXT appointment rescheduled to Thursday 04/21/2020.

## 2020-04-19 ENCOUNTER — Ambulatory Visit: Payer: Medicare Other | Admitting: Internal Medicine

## 2020-04-22 ENCOUNTER — Other Ambulatory Visit: Payer: Self-pay | Admitting: Internal Medicine

## 2020-04-22 ENCOUNTER — Other Ambulatory Visit (HOSPITAL_COMMUNITY): Payer: Medicare Other

## 2020-04-23 ENCOUNTER — Other Ambulatory Visit (HOSPITAL_COMMUNITY): Payer: Medicare Other

## 2020-04-25 ENCOUNTER — Other Ambulatory Visit (HOSPITAL_COMMUNITY)
Admission: RE | Admit: 2020-04-25 | Discharge: 2020-04-25 | Disposition: A | Discharge status: Home / Self Care | Payer: Medicare Other | Source: Ambulatory Visit | Attending: Internal Medicine | Admitting: Internal Medicine

## 2020-04-25 DIAGNOSIS — Z20822 Contact with and (suspected) exposure to covid-19: Secondary | ICD-10-CM | POA: Insufficient documentation

## 2020-04-25 DIAGNOSIS — Z01812 Encounter for preprocedural laboratory examination: Secondary | ICD-10-CM | POA: Insufficient documentation

## 2020-04-25 LAB — SARS CORONAVIRUS 2 (TAT 6-24 HRS): SARS Coronavirus 2: NEGATIVE

## 2020-04-26 ENCOUNTER — Ambulatory Visit: Payer: Medicare Other

## 2020-04-26 ENCOUNTER — Ambulatory Visit: Payer: Medicare Other | Admitting: Internal Medicine

## 2020-04-26 ENCOUNTER — Encounter: Payer: Self-pay | Admitting: Internal Medicine

## 2020-04-26 ENCOUNTER — Other Ambulatory Visit: Payer: Self-pay

## 2020-04-26 VITALS — BP 138/68 | HR 51 | Ht 66.0 in | Wt 175.0 lb

## 2020-04-26 DIAGNOSIS — I6350 Cerebral infarction due to unspecified occlusion or stenosis of unspecified cerebral artery: Secondary | ICD-10-CM

## 2020-04-26 DIAGNOSIS — R001 Bradycardia, unspecified: Secondary | ICD-10-CM

## 2020-04-26 DIAGNOSIS — R002 Palpitations: Secondary | ICD-10-CM | POA: Diagnosis not present

## 2020-04-26 DIAGNOSIS — R079 Chest pain, unspecified: Secondary | ICD-10-CM | POA: Diagnosis not present

## 2020-04-26 DIAGNOSIS — R0602 Shortness of breath: Secondary | ICD-10-CM

## 2020-04-26 DIAGNOSIS — I480 Paroxysmal atrial fibrillation: Secondary | ICD-10-CM | POA: Diagnosis not present

## 2020-04-26 LAB — EXERCISE TOLERANCE TEST
Estimated workload: 3 METS
Exercise duration (min): 2 min
Exercise duration (sec): 51 s
MPHR: 147 {beats}/min
Peak HR: 95 {beats}/min
Percent HR: 64 %
RPE: 17
Rest HR: 45 {beats}/min

## 2020-04-26 NOTE — Progress Notes (Signed)
Patient Care Team: Raina Mina., MD as PCP - General (Internal Medicine) Sueanne Margarita, MD as PCP - Cardiology (Cardiology) Deboraha Sprang, MD as PCP - Electrophysiology (Cardiology) Jovita Kussmaul, MD as Consulting Physician (General Surgery) Mauri Pole, MD as Consulting Physician (Gastroenterology)   HPI  Tiffany Wiggins is a 75 y.o. female Seen in follow-up for history of palpitations with cryptogenic stroke is status post loop recorder insertion\  10/18 atrial fibrillation was identified on her 73.  She was Rx on anticoagulation with apixaban   9/20 ended up in the hospital because of atrial fibrillation heart rate 115 associated with chest pain.  EMS called.  Had reverted to sinus rhythm by the time of their arrival.  Was admitted with borderline troponins.  Underwent CTA.  FFR 0.8 in the proximal vessel 0.6 in the distal LAD.  Thought likely related to hemodynamic stuttering down the LAD.  Is currently on a statin.  Event monitor showed poor HR excusion and with ongoing DOE and exertional paain, need to exclude chronotropic incompetence so will undertake GXT    Date Cr K LDL Hgb  9/20 1.07 3.6 211 12.7   7/21 1.21 4.2 123 12.4      DATE TEST    2015 Cath   EF 70 %   4/18 Myoview   EF 60 % No ischemia  9/20 CTA  FFR LCx 0.82//RCA 0.88          Past Medical History:  Diagnosis Date  . CVA (cerebral vascular accident) ([place]) 2019  . Diabetes mellitus type 2 in nonobese (HCC)   . Hyperlipidemia   . Hypertension    PCP GAVE FOR HER  BLOOD PRESSURE  . IBS (irritable bowel syndrome)   . PUD (peptic ulcer disease)   . TIA (transient ischemic attack) 2006    Past Surgical History:  Procedure Laterality Date  . ABDOMINAL HYSTERECTOMY    . BIOPSY THYROID  Jan. 2016  . LAPAROSCOPY N/A 08/25/2015   Procedure: EXPLORATORY LAPAROTOMY, ILEOCECECTOMY, PRIMARY ANASTOMOSIS, LYSIS OF ADHESIONS;  Surgeon: Autumn Messing III, MD;  Location: [facility];  Service:  General;  Laterality: N/A;  . laparoscopy for fertility work up    . LOOP RECORDER IMPLANT N/A 03/24/2014   Procedure: LOOP RECORDER IMPLANT;  Surgeon: Deboraha Sprang, MD;  Location: [facility] CATH LAB;  Service: Cardiovascular;  Laterality: N/A;  . LUMBAR LAMINECTOMY/DECOMPRESSION MICRODISCECTOMY  04/25/2011   Procedure: LUMBAR LAMINECTOMY/DECOMPRESSION MICRODISCECTOMY;  Surgeon: [name], MD;  Location: [facility] NEURO ORS;  Service: Neurosurgery;  Laterality: Right;  Right Lumbar Four-Five Discectomy  . OOPHORECTOMY    . TONSILLECTOMY      Current Outpatient Medications  Medication Sig Dispense Refill  . amLODipine (NORVASC) 10 MG tablet Take 10 mg by mouth daily.    Marland Kitchen apixaban (ELIQUIS) 5 MG TABS tablet Take 1 tablet (5 mg total) by mouth 2 (two) times daily. 60 tablet 0  . Coenzyme Q10 (COQ10) 100 MG CAPS Take 1 tablet by mouth daily.    . isosorbide mononitrate (IMDUR) 30 MG 24 hr tablet Take 1 tablet (30 mg total) by mouth daily. 90 tablet 3  . losartan (COZAAR) 25 MG tablet Take 25 mg by mouth daily.    . Multiple Vitamins-Minerals (VISION-VITE PRESERVE PO) Take 1 tablet by mouth 2 (two) times daily.    . Multiple Vitamins-Minerals (ZINC PO) Take 30 mg by mouth daily.    . propranolol (INDERAL) 10 MG  tablet Take 1 tablet (10 mg total) by mouth every 4 (four) hours as needed. 60 tablet 2  . rosuvastatin (CRESTOR) 5 MG tablet Take 5 mg by mouth daily.    . TURMERIC PO Take 1 tablet by mouth daily.    Marland Kitchen VITAMIN D PO Take 25 mcg by mouth daily.     No current facility-administered medications for this visit.    Allergies  Allergen Reactions  . Codeine Nausea Only  . Morphine And Related Itching    Itching       Review of Systems negative except from HPI and PMH  Physical Exam BP 138/68   Pulse (!) 51   Ht 5\' 6"  (1.676 m)   Wt 175 lb (79.4 kg)   SpO2 97%   BMI 28.25 kg/m  Well developed and nourished in no acute distress HENT normal Neck supple  Clear Regular rate and  rhythm, no murmurs or gallops Abd-soft with active BS No Clubbing cyanosis edema Skin-warm and dry A & Oriented  Grossly normal sensory and motor function       Assessment and  Plan Cryptogenic stroke  Implantable loop recorder at RRT  Hypertension  Sinus bradycardia  Chest discomfort  Exertional  Atrial fibrillation-recurrent  Emotional Lability memory loss   Continues with exertional chest pain which may be worsening although her hx is not so clear.   Will undertake treadmill test for chronotropic competence as we assess dyspnea on exertion>> pk HR 97 just about 70 % predicated max but her hip limitations were also notable

## 2020-04-26 NOTE — Patient Instructions (Signed)
Medication Instructions:  Your physician recommends that you continue on your current medications as directed. Please refer to the Current Medication list given to you today.  *If you need a refill on your cardiac medications before your next appointment, please call your pharmacy*   Lab Work: None ordered.  If you have labs (blood work) drawn today and your tests are completely normal, you will receive your results only by: Marland Kitchen MyChart Message (if you have MyChart) OR . A paper copy in the mail If you have any lab test that is abnormal or we need to change your treatment, we will call you to review the results.   Testing/Procedures: Your physician has requested that you have a lexiscan myoview. For further information please visit [email or web address]. Please follow instruction sheet, as given  Follow-Up: At [facility] you and your health needs are our priority.  As part of our continuing mission to provide you with exceptional heart care, we have created designated Provider Care Teams.  These Care Teams include your primary Cardiologist (physician) and Advanced Practice Providers (APPs -  Physician Assistants and Nurse Practitioners) who all work together to provide you with the care you need, when you need it.  We recommend signing up for the patient portal called "MyChart".  Sign up information is provided on this After Visit Summary.  MyChart is used to connect with patients for Virtual Visits (Telemedicine).  Patients are able to view lab/test results, encounter notes, upcoming appointments, etc.  Non-urgent messages can be sent to your provider as well.   To learn more about what you can do with MyChart, go to [email or web address].    Your next appointment:   As scheduled

## 2020-05-12 ENCOUNTER — Ambulatory Visit: Payer: Medicare Other

## 2020-05-23 ENCOUNTER — Telehealth (HOSPITAL_COMMUNITY): Payer: Self-pay | Admitting: *Deleted

## 2020-05-23 NOTE — Telephone Encounter (Signed)
Patient given detailed instructions per Myocardial Perfusion Study Information Sheet for the test on 05/25/20. Patient notified to arrive 15 minutes early and that it is imperative to arrive on time for appointment to keep from having the test rescheduled.  If you need to cancel or reschedule your appointment, please call the office within 24 hours of your appointment. . Patient verbalized understanding. [name], [name]    

## 2020-05-25 ENCOUNTER — Encounter (HOSPITAL_COMMUNITY): Payer: Medicare Other

## 2020-06-09 ENCOUNTER — Ambulatory Visit: Payer: Medicare Other

## 2020-06-15 ENCOUNTER — Telehealth (HOSPITAL_COMMUNITY): Payer: Self-pay | Admitting: *Deleted

## 2020-06-15 NOTE — Telephone Encounter (Signed)
Patient given detailed instructions per Myocardial Perfusion Study Information Sheet for the test on 06/17/20 at 10:30. Patient notified to arrive 15 minutes early and that it is imperative to arrive on time for appointment to keep from having the test rescheduled.  If you need to cancel or reschedule your appointment, please call the office within 24 hours of your appointment. . Patient verbalized understanding.Tiffany Wiggins

## 2020-06-17 ENCOUNTER — Ambulatory Visit (HOSPITAL_COMMUNITY): Payer: Medicare Other | Attending: Cardiology

## 2020-06-17 ENCOUNTER — Other Ambulatory Visit: Payer: Self-pay

## 2020-06-17 DIAGNOSIS — R079 Chest pain, unspecified: Secondary | ICD-10-CM | POA: Diagnosis present

## 2020-06-17 LAB — MYOCARDIAL PERFUSION IMAGING
LV dias vol: 58 mL (ref 46–106)
LV sys vol: 17 mL
Peak HR: 88 {beats}/min
Rest HR: 52 {beats}/min
SDS: 0
SRS: 0
SSS: 0
TID: 0.95

## 2020-06-17 MED ORDER — REGADENOSON 0.4 MG/5ML IV SOLN
0.4000 mg | Freq: Once | INTRAVENOUS | Status: AC
  Administered 2020-06-17: 0.4 mg via INTRAVENOUS

## 2020-06-17 MED ORDER — TECHNETIUM TC 99M TETROFOSMIN IV KIT
10.1000 | PACK | Freq: Once | INTRAVENOUS | Status: AC | PRN
  Administered 2020-06-17: 10.1 via INTRAVENOUS
  Filled 2020-06-17: qty 11

## 2020-06-17 MED ORDER — TECHNETIUM TC 99M TETROFOSMIN IV KIT
30.9000 | PACK | Freq: Once | INTRAVENOUS | Status: AC | PRN
  Administered 2020-06-17: 30.9 via INTRAVENOUS
  Filled 2020-06-17: qty 31

## 2020-08-01 ENCOUNTER — Ambulatory Visit
Admission: RE | Admit: 2020-08-01 | Discharge: 2020-08-01 | Disposition: A | Discharge status: Home / Self Care | Payer: Medicare Other | Source: Ambulatory Visit | Attending: Internal Medicine | Admitting: Internal Medicine

## 2020-08-01 ENCOUNTER — Other Ambulatory Visit: Payer: Self-pay

## 2020-08-01 DIAGNOSIS — Z1231 Encounter for screening mammogram for malignant neoplasm of breast: Secondary | ICD-10-CM

## 2020-08-31 NOTE — Telephone Encounter (Signed)
Called and spoke with patient about risk, real but low

## 2020-09-19 ENCOUNTER — Telehealth: Payer: Self-pay | Admitting: Internal Medicine

## 2020-09-19 NOTE — Telephone Encounter (Signed)
Pharmacy, can you please comment on how long patient can hold Eliquis for upcoming colonoscopy?  Thank you! 

## 2020-09-19 NOTE — Telephone Encounter (Signed)
Patient with diagnosis of afib on Eliquis for anticoagulation.    Procedure: colonoscopy Date of procedure: TBD  CHA2DS2-VASc Score = 7  This indicates a 11.2% annual risk of stroke. The patient's score is based upon: CHF History: No HTN History: Yes Diabetes History: Yes Stroke History: Yes Vascular Disease History: Yes Age Score: 1 Gender Score: 1    CrCl 48.8 ml/min Platelet count 196  Protocol would recommend patient hold Eliquis 1 day prior to procedure. MD is requesting 2 days. I will defer to Dr. Caryl Comes. Appears patient has discussed risks of holding anticoagulation with Dr. Caryl Comes in the past.

## 2020-09-19 NOTE — Telephone Encounter (Signed)
     [place] Pre-operative Risk Assessment    Patient Name: Tiffany Wiggins  DOB: 10/13/1945  MRN: [id number]   HEARTCARE STAFF: - Please ensure there is not already an duplicate clearance open for this procedure. - Under Visit Info/Reason for Call, type in Other and utilize the format Clearance MM/DD/YY or Clearance TBD. Do not use dashes or single digits. - If request is for dental extraction, please clarify the # of teeth to be extracted. - If the patient is currently at the dentist's office, call Pre-Op APP to address. If the patient is not currently in the dentist office, please route to the Pre-Op pool  Request for surgical clearance:  What type of surgery is being performed? colonoscopy  When is this surgery scheduled? TBD  What type of clearance is required (medical clearance vs. Pharmacy clearance to hold med vs. Both)? Both  Are there any medications that need to be held prior to surgery and how long? Eliquis 2 days prior  Practice name and name of physician performing surgery? [facility], Dr. Dorcas Carrow  What is the office phone number? [phone] Seashore, PA, direct number   7.   What is the office fax number? [phone]  8.   Anesthesia type (None, local, MAC, general) ? Propofol    Selena Zobro 09/19/2020, 11:51 AM  _________________________________________________________________   (provider comments below)    Per Merrilee Seashore, PA with [facility], procedure is urgent

## 2020-09-20 ENCOUNTER — Telehealth: Payer: Self-pay | Admitting: Internal Medicine

## 2020-09-20 NOTE — Telephone Encounter (Signed)
   [place] Pre-operative Risk Assessment    Patient Name: Tiffany Wiggins  DOB: 05/30/1945  MRN: [id number]   HEARTCARE STAFF: - Please ensure there is not already an duplicate clearance open for this procedure. - Under Visit Info/Reason for Call, type in Other and utilize the format Clearance MM/DD/YY or Clearance TBD. Do not use dashes or single digits. - If request is for dental extraction, please clarify the # of teeth to be extracted. - If the patient is currently at the dentist's office, call Pre-Op APP to address. If the patient is not currently in the dentist office, please route to the Pre-Op pool  Request for surgical clearance:  What type of surgery is being performed? colonoscopy   When is this surgery scheduled? 09/23/18   What type of clearance is required (medical clearance vs. Pharmacy clearance to hold med vs. Both)? Pharmacy  Are there any medications that need to be held prior to surgery and how long? E liquis; 2 days prior   Practice name and name of physician performing surgery? [facility]; Dr. Rolm Wiggins   What is the office phone number? [phone]   7.   What is the office fax number? [phone]  8.   Anesthesia type (None, local, MAC, general) ? Propofol   Tiffany Wiggins 09/20/2020, 11:15 AM  _________________________________________________________________   (provider comments below)

## 2020-09-20 NOTE — Telephone Encounter (Signed)
Per Dr. Caryl Comes, pt may hold Eliquis 1.5 days prior to procedure (3 doses)

## 2020-09-20 NOTE — Telephone Encounter (Signed)
Let do 3 doses :)))

## 2020-09-20 NOTE — Telephone Encounter (Signed)
    Tiffany Wiggins DOB:  1945-07-17  MRN:  [id number]   Primary Cardiologist: Fransico Him, MD  Chart reviewed as part of pre-operative protocol coverage. Patient was last seen by Dr. Caryl Comes in 04/2020 at which time stress test was ordered for further evaluation of dyspnea on exertion and some exertion chest pain. Myoview on 06/17/2020 was low risk with no evidence of ischemia or prior infarct. Patient was contacted today for further pre-op evaluation and reported doing well from a cardiac standpoint. No chest pain, significant shortness of breath, orthopnea/PND, significant dizziness, or syncope. Given past medical history and time since last visit, based on ACC/AHA guidelines, Tiffany Wiggins would be at acceptable risk for the planned procedure without further cardiovascular testing.   Dr. Caryl Comes recommends holding Eliquis for 1.5 days (3 doses) given history of stroke and high CHA2DS2-VASc score. Reviewed this recommendation with patient and husband and they voiced understanding. Please restart this as soon as able following colonoscopy.   I will route this recommendation to the requesting party via Epic fax function and remove from pre-op pool.  Please call with questions.  [name], PA-C 09/20/2020, 11:41 AM

## 2020-09-20 NOTE — Telephone Encounter (Signed)
Duplicate request. Already addressed.  [name], PA-C 09/20/2020 11:48 AM

## 2020-12-19 NOTE — Progress Notes (Signed)
PCP:  Raina Mina., MD Primary Cardiologist: Fransico Him, MD Electrophysiologist: Virl Axe, MD   Tiffany Wiggins is a 75 y.o. female seen today for Virl Axe, MD for routine electrophysiology followup.  Since last being seen in our clinic the patient reports doing OK from a heart perspective. She has been having intermittent Left ear and Jaw Pain. It can happen up to several times a day. No clear aggravating or relieving factor. She states it starts as a pressure/ache near her left auricle, then becomes sharp and she can hear a "grinding or scratching" sound on that side.  She is most worried that is related to her carotid narrowing noted on previous testing. Her hips are "bone on bone" bilaterally and she is considering surgery. she denies chest pain, palpitations, dyspnea, PND, orthopnea, nausea, vomiting, dizziness, syncope, edema, weight gain, or early satiety.  Past Medical History:  Diagnosis Date   CVA (cerebral vascular accident) ([place]) 2019   Diabetes mellitus type 2 in nonobese ([place])    Hyperlipidemia    Hypertension    PCP GAVE FOR HER  BLOOD PRESSURE   IBS (irritable bowel syndrome)    PUD (peptic ulcer disease)    TIA (transient ischemic attack) 2006   Past Surgical History:  Procedure Laterality Date   ABDOMINAL HYSTERECTOMY     BIOPSY THYROID  Jan. 2016   LAPAROSCOPY N/A 08/25/2015   Procedure: EXPLORATORY LAPAROTOMY, ILEOCECECTOMY, PRIMARY ANASTOMOSIS, LYSIS OF ADHESIONS;  Surgeon: Autumn Messing III, MD;  Location: [facility] ORS;  Service: General;  Laterality: N/A;   laparoscopy for fertility work up     [place] N/A 03/24/2014   Procedure: LOOP RECORDER IMPLANT;  Surgeon: Deboraha Sprang, MD;  Location: [facility] CATH LAB;  Service: Cardiovascular;  Laterality: N/A;   LUMBAR LAMINECTOMY/DECOMPRESSION MICRODISCECTOMY  04/25/2011   Procedure: LUMBAR LAMINECTOMY/DECOMPRESSION MICRODISCECTOMY;  Surgeon: [name], MD;  Location: [facility] NEURO ORS;  Service:  Neurosurgery;  Laterality: Right;  Right Lumbar Four-Five Discectomy   OOPHORECTOMY     TONSILLECTOMY      Current Outpatient Medications  Medication Sig Dispense Refill   amLODipine (NORVASC) 2.5 MG tablet Take 2.5 mg by mouth daily.     apixaban (ELIQUIS) 5 MG TABS tablet Take 1 tablet (5 mg total) by mouth 2 (two) times daily. 60 tablet 0   azelastine (ASTELIN) 0.1 % nasal spray Place into the nose as needed.     Coenzyme Q10 (COQ10) 100 MG CAPS Take 1 tablet by mouth daily.     isosorbide mononitrate (IMDUR) 30 MG 24 hr tablet Take 1 tablet (30 mg total) by mouth daily. 90 tablet 3   losartan (COZAAR) 50 MG tablet Take 50 mg by mouth daily.     Multiple Vitamins-Minerals (ZINC PO) Take 30 mg by mouth daily.     propranolol (INDERAL) 10 MG tablet Take 1 tablet (10 mg total) by mouth every 4 (four) hours as needed. 60 tablet 2   rosuvastatin (CRESTOR) 5 MG tablet Take 5 mg by mouth daily.     TURMERIC PO Take 1 tablet by mouth daily.     VITAMIN D PO Take 25 mcg by mouth daily.     Zinc 30 MG CAPS Take by mouth daily.     No current facility-administered medications for this visit.    Allergies  Allergen Reactions   Codeine Nausea Only   Morphine And Related Itching    Itching     Social History   Socioeconomic  History   Marital status: Married    Spouse name: Not on file   Number of children: Not on file   Years of education: Not on file   Highest education level: Not on file  Occupational History   Occupation: retired  Tobacco Use   Smoking status: Former    Types: Cigarettes    Quit date: 04/02/2004    Years since quitting: 16.7   Smokeless tobacco: Never  Vaping Use   Vaping Use: Never used  Substance and Sexual Activity   Alcohol use: Yes    Alcohol/week: 2.0 standard drinks    Types: 2 Glasses of wine per week   Drug use: No   Sexual activity: Not on file  Other Topics Concern   Not on file  Social History Narrative   Not on file   Social Determinants  of Health   Financial Resource Strain: Not on file  Food Insecurity: Not on file  Transportation Needs: Not on file  Physical Activity: Not on file  Stress: Not on file  Social Connections: Not on file  Intimate Partner Violence: Not on file     Review of Systems: All other systems reviewed and are otherwise negative except as noted above.  Physical Exam: Vitals:   12/20/20 1106  BP: 130/68  Pulse: (!) 45  SpO2: 100%  Weight: 165 lb (74.8 kg)  Height: 5\' 6"  (1.676 m)    GEN- The patient is well appearing, alert and oriented x 3 today.   HEENT: normocephalic, atraumatic; sclera clear, conjunctiva pink; hearing intact; oropharynx clear; neck supple, no JVP Lymph- no cervical lymphadenopathy Lungs- Clear to ausculation bilaterally, normal work of breathing.  No wheezes, rales, rhonchi Heart- Regular rate and rhythm, no murmurs, rubs or gallops, PMI not laterally displaced GI- soft, non-tender, non-distended, bowel sounds present, no hepatosplenomegaly Extremities- no clubbing, cyanosis, or edema; DP/PT/radial pulses 2+ bilaterally MS- no significant deformity or atrophy Skin- warm and dry, no rash or lesion Psych- euthymic mood, full affect Neuro- strength and sensation are intact  EKG is ordered. Personal review of EKG from today shows Sinus bradycardia at 49 bpm. This is stable.  Additional studies reviewed include: Previous EP office notes.   Assessment and Plan:  1. Cryptogenic Stroke Continue Eliquis.  2. Paroxysmal atrial fibrillation Previously identified by loop (Previously at RRT)  3. Sinus bradycardia Follow as she is stable and asymptomatic.  4. HTN Stable on current regimen   5. Chest discomfort Intermittent, mostly atypical. Normal myoview 06/17/20  6. Left ear pain She is concerned this is related to previous carotid dopplers which showed mild non-obstructive narrowing. We discussed that this is very rare. Follow up with PCP. In lieu of that, I  recommend she ask for a referral to ENT and also mention to her Dentist. DDx includes TMJ. There is no obvious trauma or inflammation to the area. She explicitly denies jaw claudication type symptoms. No aggravation with eating or exertion.   7. 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 Will order  8. Cardiac clearance for hip surgery (eventual) She is overall stable. I suspect she would be OK to proceed with low to moderate risk given her co-morbidities.  She has baseline bradycardia so would worry about peri-procedural bradycardia with sedation and increased vagal tone.  She understands the risk of increased paroxysms of AF surrounding sedation or surgical pain, as well as the risk of thromboembolism with holding [place], and is willing to accept these risks.  Shirley Friar, PA-C  12/20/20 1:00 PM

## 2020-12-20 ENCOUNTER — Other Ambulatory Visit: Payer: Self-pay

## 2020-12-20 ENCOUNTER — Encounter: Payer: Self-pay | Admitting: Student

## 2020-12-20 ENCOUNTER — Ambulatory Visit: Payer: Medicare Other | Admitting: Student

## 2020-12-20 VITALS — BP 130/68 | HR 45 | Ht 66.0 in | Wt 165.0 lb

## 2020-12-20 DIAGNOSIS — R0602 Shortness of breath: Secondary | ICD-10-CM | POA: Diagnosis not present

## 2020-12-20 DIAGNOSIS — I480 Paroxysmal atrial fibrillation: Secondary | ICD-10-CM | POA: Diagnosis not present

## 2020-12-20 DIAGNOSIS — R079 Chest pain, unspecified: Secondary | ICD-10-CM | POA: Diagnosis not present

## 2020-12-20 DIAGNOSIS — R002 Palpitations: Secondary | ICD-10-CM | POA: Diagnosis not present

## 2020-12-20 DIAGNOSIS — Z9889 Other specified postprocedural states: Secondary | ICD-10-CM

## 2020-12-20 DIAGNOSIS — I6523 Occlusion and stenosis of bilateral carotid arteries: Secondary | ICD-10-CM

## 2020-12-20 DIAGNOSIS — I639 Cerebral infarction, unspecified: Secondary | ICD-10-CM

## 2020-12-20 NOTE — Patient Instructions (Signed)
Medication Instructions:  Your physician recommends that you continue on your current medications as directed. Please refer to the Current Medication list given to you today.  *If you need a refill on your cardiac medications before your next appointment, please call your pharmacy*   Lab Work: None If you have labs (blood work) drawn today and your tests are completely normal, you will receive your results only by: [place] (if you have MyChart) OR A paper copy in the mail If you have any lab test that is abnormal or we need to change your treatment, we will call you to review the results.   Testing/Procedures: Your physician has requested that you have a carotid duplex. This test is an ultrasound of the carotid arteries in your neck. It looks at blood flow through these arteries that supply the brain with blood. Allow one hour for this exam. There are no restrictions or special instructions.   Follow-Up: At [facility], you and your health needs are our priority.  As part of our continuing mission to provide you with exceptional heart care, we have created designated Provider Care Teams.  These Care Teams include your primary Cardiologist (physician) and Advanced Practice Providers (APPs -  Physician Assistants and Nurse Practitioners) who all work together to provide you with the care you need, when you need it.   Your next appointment:   1 year(s)  The format for your next appointment:   In Person  Provider:   You may see Virl Axe, MD or one of the following Advanced Practice Providers on your designated Care Team:   Tommye Standard, [place] "[facility]" [place], [place]

## 2020-12-22 ENCOUNTER — Other Ambulatory Visit: Payer: Self-pay | Admitting: Internal Medicine

## 2020-12-22 MED ORDER — PROPRANOLOL HCL 10 MG PO TABS: 10.0000 mg | ORAL_TABLET | ORAL | 2 refills | Status: DC | PRN

## 2020-12-22 NOTE — Telephone Encounter (Signed)
Refill for propranolol sent to the pts confirmed pharmacy of choice, as requested in this mychart message.  Pt and husband made aware.

## 2021-01-05 ENCOUNTER — Other Ambulatory Visit: Payer: Self-pay

## 2021-01-05 ENCOUNTER — Ambulatory Visit (HOSPITAL_COMMUNITY)
Admission: RE | Admit: 2021-01-05 | Discharge: 2021-01-05 | Disposition: A | Discharge status: Home / Self Care | Payer: Medicare Other | Source: Ambulatory Visit | Attending: Cardiology | Admitting: Cardiology

## 2021-01-05 DIAGNOSIS — I6523 Occlusion and stenosis of bilateral carotid arteries: Secondary | ICD-10-CM | POA: Diagnosis not present

## 2021-01-27 ENCOUNTER — Other Ambulatory Visit: Payer: Self-pay | Admitting: Internal Medicine

## 2021-02-20 ENCOUNTER — Encounter: Payer: Self-pay | Admitting: Internal Medicine

## 2021-02-28 ENCOUNTER — Other Ambulatory Visit: Payer: Self-pay | Admitting: Internal Medicine

## 2021-03-07 NOTE — Progress Notes (Signed)
PCP:  Raina Mina., MD Primary Cardiologist: Fransico Him, MD Electrophysiologist: Virl Axe, MD   Tiffany Wiggins is a 75 y.o. female seen today for Virl Axe, MD for routine electrophysiology followup.  Since last being seen in our clinic the patient reports doing about the same.  She continues to have relatively infrequent episodes of AF, but they are quite symptomatic. She has twice called EMS in the past month for elevated HRs associated with chest pain. Ischemic work up in the past has been unrevealing, but has perhaps demonstrated the possibility of demand ischemia of the LAD when rates are fast. She has had issues with her memory lately.  She intermittent left head HAs, and occasional's "zings" of pain in her extremities. Pains that last seconds with no clear triggers.  Past Medical History:  Diagnosis Date   CVA (cerebral vascular accident) ([place]) 2019   Diabetes mellitus type 2 in nonobese ([place])    Hyperlipidemia    Hypertension    PCP GAVE FOR HER  BLOOD PRESSURE   IBS (irritable bowel syndrome)    PUD (peptic ulcer disease)    TIA (transient ischemic attack) 2006   Past Surgical History:  Procedure Laterality Date   ABDOMINAL HYSTERECTOMY     BIOPSY THYROID  Jan. 2016   LAPAROSCOPY N/A 08/25/2015   Procedure: EXPLORATORY LAPAROTOMY, ILEOCECECTOMY, PRIMARY ANASTOMOSIS, LYSIS OF ADHESIONS;  Surgeon: Autumn Messing III, MD;  Location: [facility];  Service: General;  Laterality: N/A;   laparoscopy for fertility work up     [place] N/A 03/24/2014   Procedure: LOOP RECORDER IMPLANT;  Surgeon: Deboraha Sprang, MD;  Location: [facility] CATH LAB;  Service: Cardiovascular;  Laterality: N/A;   LUMBAR LAMINECTOMY/DECOMPRESSION MICRODISCECTOMY  04/25/2011   Procedure: LUMBAR LAMINECTOMY/DECOMPRESSION MICRODISCECTOMY;  Surgeon: [name], MD;  Location: [place] NEURO ORS;  Service: Neurosurgery;  Laterality: Right;  Right Lumbar Four-Five Discectomy   OOPHORECTOMY     TONSILLECTOMY       Current Outpatient Medications  Medication Sig Dispense Refill   amLODipine (NORVASC) 2.5 MG tablet Take 1 tablet by mouth once daily 90 tablet 0   apixaban (ELIQUIS) 5 MG TABS tablet Take 1 tablet (5 mg total) by mouth 2 (two) times daily. 60 tablet 0   azelastine (ASTELIN) 0.1 % nasal spray Place into the nose as needed.     Coenzyme Q10 (COQ10) 100 MG CAPS Take 1 tablet by mouth daily.     losartan (COZAAR) 50 MG tablet Take 1 tablet by mouth once daily 90 tablet 0   Multiple Vitamins-Minerals (ZINC PO) Take 30 mg by mouth daily.     propranolol (INDERAL) 10 MG tablet Take 1 tablet (10 mg total) by mouth every 4 (four) hours as needed. 60 tablet 2   rosuvastatin (CRESTOR) 5 MG tablet Take 5 mg by mouth daily.     TURMERIC PO Take 1 tablet by mouth daily.     VITAMIN D PO Take 25 mcg by mouth daily.     Zinc 30 MG CAPS Take by mouth daily.     isosorbide mononitrate (IMDUR) 60 MG 24 hr tablet Take 1 tablet (60 mg total) by mouth daily. 90 tablet 3   No current facility-administered medications for this visit.    Allergies  Allergen Reactions   Codeine Nausea Only   Morphine And Related Itching    Itching     Social History   Socioeconomic History   Marital status: Married  Spouse name: Not on file   Number of children: Not on file   Years of education: Not on file   Highest education level: Not on file  Occupational History   Occupation: retired  Tobacco Use   Smoking status: Former    Types: Cigarettes    Quit date: 04/02/2004    Years since quitting: 16.9   Smokeless tobacco: Never  Vaping Use   Vaping Use: Never used  Substance and Sexual Activity   Alcohol use: Yes    Alcohol/week: 2.0 standard drinks    Types: 2 Glasses of wine per week   Drug use: No   Sexual activity: Not on file  Other Topics Concern   Not on file  Social History Narrative   Not on file   Social Determinants of Health   Financial Resource Strain: Not on file  Food Insecurity:  Not on file  Transportation Needs: Not on file  Physical Activity: Not on file  Stress: Not on file  Social Connections: Not on file  Intimate Partner Violence: Not on file     Review of Systems: All other systems reviewed and are otherwise negative except as noted above.  Physical Exam: Vitals:   03/08/21 1046  BP: 130/70  Pulse: (!) 52  SpO2: 95%  Weight: 163 lb (73.9 kg)  Height: 5\' 6"  (1.676 m)    GEN- The patient is well appearing, alert and oriented x 3 today.   HEENT: normocephalic, atraumatic; sclera clear, conjunctiva pink; hearing intact; oropharynx clear; neck supple, no JVP Lymph- no cervical lymphadenopathy Lungs- Clear to ausculation bilaterally, normal work of breathing.  No wheezes, rales, rhonchi Heart- Regular rate and rhythm, no murmurs, rubs or gallops, PMI not laterally displaced GI- soft, non-tender, non-distended, bowel sounds present, no hepatosplenomegaly Extremities- no clubbing, cyanosis, or edema; DP/PT/radial pulses 2+ bilaterally MS- no significant deformity or atrophy Skin- warm and dry, no rash or lesion Psych- euthymic mood, full affect Neuro- strength and sensation are intact  EKG is ordered. Personal review of EKG from today shows sinus bradycardia at 52 bpm  Additional studies reviewed include: Previous EP office notes.   Assessment and Plan:  1. Cryptogenic Stroke Continue Eliquis.   2. Paroxysmal atrial fibrillation Previously identified by loop (Previously at RRT) EKG today shows sinus bradycardia which is her norm. She has infrequent episodes of AF but they are very bothersome to her with severe chest pain  Increase imdur to hopefull edge off some of the pain when she has rapid rates.  We discussed at length consideration for Tikosyn, which could help her have less AF, and not slow her rates.  Other rhythm control would also affect rate, and likely need to PPM sooner rather than later.    3. Sinus bradycardia Follow as she  is stable and asymptomatic.   4. HTN Stable on current regimen    5. Chest discomfort Intermittent, mostly atypical. Normal myoview 06/17/20 No change   6. Mild non-obstructive carotid disease Stable by dopplers 12/2020. Annual f/u.  Follow up with  AF clinic  in 2-3 weeks. I will discuss Tikosyn with Dr. Caryl Comes to make sure he agrees this is best, first option for her.  Ultimately she may require pacing for tachy brady.  I think with her co-morbidities the risk of ablation complications could potentially be quite high, and we will consider less invasive means first.  Shirley Friar, PA-C  03/08/21 11:45 AM

## 2021-03-08 ENCOUNTER — Encounter: Payer: Self-pay | Admitting: Student

## 2021-03-08 ENCOUNTER — Ambulatory Visit: Payer: Medicare Other | Admitting: Student

## 2021-03-08 ENCOUNTER — Other Ambulatory Visit: Payer: Self-pay

## 2021-03-08 VITALS — BP 130/70 | HR 52 | Ht 66.0 in | Wt 163.0 lb

## 2021-03-08 DIAGNOSIS — R001 Bradycardia, unspecified: Secondary | ICD-10-CM

## 2021-03-08 DIAGNOSIS — Z9889 Other specified postprocedural states: Secondary | ICD-10-CM

## 2021-03-08 DIAGNOSIS — I10 Essential (primary) hypertension: Secondary | ICD-10-CM

## 2021-03-08 DIAGNOSIS — I480 Paroxysmal atrial fibrillation: Secondary | ICD-10-CM

## 2021-03-08 DIAGNOSIS — I6523 Occlusion and stenosis of bilateral carotid arteries: Secondary | ICD-10-CM

## 2021-03-08 DIAGNOSIS — R079 Chest pain, unspecified: Secondary | ICD-10-CM

## 2021-03-08 DIAGNOSIS — I639 Cerebral infarction, unspecified: Secondary | ICD-10-CM

## 2021-03-08 DIAGNOSIS — R002 Palpitations: Secondary | ICD-10-CM

## 2021-03-08 MED ORDER — ISOSORBIDE MONONITRATE ER 60 MG PO TB24: 60.0000 mg | ORAL_TABLET | Freq: Every day | ORAL | 3 refills | Status: DC

## 2021-03-08 NOTE — Patient Instructions (Addendum)
Medication Instructions:  Your physician has recommended you make the following change in your medication:   INCREASE: Isosorbide to 60mg  daily  *If you need a refill on your cardiac medications before your next appointment, please call your pharmacy*   Lab Work: None If you have labs (blood work) drawn today and your tests are completely normal, you will receive your results only by: [place] (if you have MyChart) OR A paper copy in the mail If you have any lab test that is abnormal or we need to change your treatment, we will call you to review the results.   Follow-Up: At [facility], you and your health needs are our priority.  As part of our continuing mission to provide you with exceptional heart care, we have created designated Provider Care Teams.  These Care Teams include your primary Cardiologist (physician) and Advanced Practice Providers (APPs -  Physician Assistants and Nurse Practitioners) who all work together to provide you with the care you need, when you need it.   Your next appointment:   2-3 week(s) for Tikosyn consideration  The format for your next appointment:   In Person  Provider:   You will follow up in the [facility] located at [facility]. Your provider will be: Roderic Palau, NP   AFIB CLINIC INFORMATION: Your appointment is scheduled on:            at             . Please arrive 15 minutes early for check-in. The AFib Clinic is located in the Heart and Vascular Specialty Clinics at [facility]. Parking instructions/directions: [address] (off [address]). When you pull in to Entrance C, there is an underground parking garage to your right. The code to enter the garage is [phone]) [phone]. Take the elevators to the first floor. Follow the signs to the Heart and Vascular Specialty Clinics. You will see registration at the end of the hallway.  Phone number: [phone]

## 2021-03-29 ENCOUNTER — Ambulatory Visit (HOSPITAL_COMMUNITY): Payer: Medicare Other | Admitting: Nurse Practitioner

## 2021-04-04 ENCOUNTER — Other Ambulatory Visit: Payer: Self-pay | Admitting: Internal Medicine

## 2021-04-06 ENCOUNTER — Other Ambulatory Visit: Payer: Self-pay | Admitting: Internal Medicine

## 2021-04-07 MED ORDER — ROSUVASTATIN CALCIUM 5 MG PO TABS: 5.0000 mg | ORAL_TABLET | Freq: Every day | ORAL | 3 refills | Status: DC

## 2021-05-01 ENCOUNTER — Other Ambulatory Visit: Payer: Self-pay | Admitting: Internal Medicine

## 2021-05-08 ENCOUNTER — Telehealth: Payer: Self-pay | Admitting: *Deleted

## 2021-05-08 NOTE — Telephone Encounter (Signed)
° °  Pre-operative Risk Assessment    Patient Name: Tiffany Wiggins  DOB: 1945-09-11 MRN: [id number]      Request for Surgical Clearance    Procedure:   LEFT TOTAL HIP ARTHROPLASTY   Date of Surgery:  Clearance 09/06/21                                 Surgeon:  DR. Maureen Ralphs Surgeon's Group or Practice Name:  [facility] Phone number:  [phone] Fax number:  [phone] ATTN:  KELLY [id number]  Type of Clearance Requested:   - Medical  - Pharmacy:  Hold Apixaban (Eliquis) NOT INDICATED   Type of Anesthesia:   CHOICE   Additional requests/questions:    Astrid Divine   05/08/2021, 4:13 PM

## 2021-05-08 NOTE — Telephone Encounter (Signed)
Pharmacy, can you please comment on how long Eliquis can be held for upcoming procedure?  Thank you! 

## 2021-05-09 NOTE — Telephone Encounter (Signed)
° °  Name: Tiffany Wiggins  DOB: [birth year]  MRN: [id number]  Primary Cardiologist: Fransico Him, MD (seen in consult in 2020), otherwise primarily followed by EP - Dr. Caryl Comes  Chart reviewed as part of pre-operative protocol coverage. Because of [name]'s past medical history and time since last visit, she will require a follow-up visit in order to better assess preoperative cardiovascular risk.  She has a known history of atrial fibrillation amongst multiple cardiac risk factors including CVA, DM, HTN, HLD. She has had issues with her memory per chart so phone clearance may not be appropriate. At last OV 03/2021 she had called EMS twice in the context of chest pain. Per last note, " Ischemic work up in the past has been unrevealing, but has perhaps demonstrated the possibility of demand ischemia of the LAD when rates are fast."  Imdur was increased. There was also discussion about Tikosyn versus rhythm control which would necessitate PPM sooner rather than later.   CALLBACK: given complex history, would suggest she have a preop visit closer to time of surgery. OK to be with general cardiology APP - please schedule for early May 2023. Otherwise at last visit it was suggested she see atrial fibrillation clinic in 2-3 weeks but I do not see this occurred. Please recommend she call to schedule this.  Pre-op covering staff: - Please schedule appointment as above and call patient to inform them - Please contact requesting surgeon's office via preferred method (i.e, phone, fax) to inform them of need for appointment prior to surgery.  Re: anticoagulation, pharmD has routed to Dr. Caryl Comes for input as well so that this is available at time of OV.  Charlie Pitter, PA-C  05/09/2021, 1:00 PM

## 2021-05-09 NOTE — Telephone Encounter (Signed)
Patient with diagnosis of atrial fibrillation on Eliquis for anticoagulation.    Procedure: left total hip arthroplasty Date of procedure: 09/06/21   CHA2DS2-VASc Score = 7   This indicates a 11.2% annual risk of stroke. The patient's score is based upon: CHF History: 0 HTN History: 1 Diabetes History: 1 Stroke History: 2 Vascular Disease History: 1 Age Score: 1 Gender Score: 1    CrCl 41 Platelet count 173  Per chart records in 2012 conversion to EMR there was indication of previous TIA - possibly in 2009.  For previous clearance in 2022 it was recommended to hold Eliquis for 3 doses because of high CHADS2-VASc score.  Will defer to Dr. Caryl Comes to determine if need for lovenox bridge, as orthopedic procedure uses spinal anesthesia, requiring 3 day hold of Eliquis.     For orthopedic procedures please be sure to resume therapeutic (not prophylactic) dosing.

## 2021-05-11 NOTE — Telephone Encounter (Signed)
I tried to reach the pt to make an appt for pre op clearance. No answer.

## 2021-05-12 NOTE — Telephone Encounter (Signed)
I s/w the pt today and advised she will need an appt for pre op clearance. Pt is agreeable to plan of care. Pt has been scheduled to see Ermalinda Barrios, [facility] 07/11/21 @ 12:15. Pt thanked me for the call and the help. Pt states that both her and husband have covid right now. I will update the surgeon's office the pt has appt 07/11/21.

## 2021-05-23 DIAGNOSIS — R001 Bradycardia, unspecified: Secondary | ICD-10-CM | POA: Insufficient documentation

## 2021-05-24 ENCOUNTER — Encounter: Payer: Self-pay | Admitting: Internal Medicine

## 2021-06-05 ENCOUNTER — Encounter: Payer: Self-pay | Admitting: Internal Medicine

## 2021-06-05 ENCOUNTER — Other Ambulatory Visit: Payer: Self-pay

## 2021-06-05 ENCOUNTER — Ambulatory Visit: Payer: Medicare Other | Admitting: Internal Medicine

## 2021-06-05 VITALS — BP 120/68 | HR 46 | Ht 66.0 in | Wt 160.3 lb

## 2021-06-05 DIAGNOSIS — I480 Paroxysmal atrial fibrillation: Secondary | ICD-10-CM

## 2021-06-05 DIAGNOSIS — I10 Essential (primary) hypertension: Secondary | ICD-10-CM

## 2021-06-05 DIAGNOSIS — R001 Bradycardia, unspecified: Secondary | ICD-10-CM | POA: Diagnosis not present

## 2021-06-05 NOTE — Progress Notes (Signed)
? ? ? ? ? ?Patient Care Team: ?Raina Mina., MD as PCP - General (Internal Medicine) ?Sueanne Margarita, MD as PCP - Cardiology (Cardiology) ?Deboraha Sprang, MD as PCP - Electrophysiology (Cardiology) ?Jovita Kussmaul, MD as Consulting Physician (General Surgery) ?Mauri Pole, MD as Consulting Physician (Gastroenterology) ? ? ?HPI ? ?Tiffany Wiggins is a 76 y.o. female ?Seen in follow-up for history of palpitations with cryptogenic stroke is status post loop recorder insertion\ ? ?10/18 atrial fibrillation was identified on her LINQ.  She was Rx on anticoagulation with apixaban  ? ?9/20 ended up in the hospital because of atrial fibrillation heart rate 115 associated with chest pain.  EMS called.  Had reverted to sinus rhythm by the time of their arrival.  Was admitted with borderline troponins.  Underwent CTA.  FFR 0.8 in the proximal vessel 0.6 in the distal LAD.  Thought likely related to hemodynamic stuttering down the LAD.  Is currently on a statin. ? ?Event monitor showed poor HR excusion and with ongoing DOE and exertional pain, GXT  undertake treadmill test for chronotropic competence >> pk HR 97 just about 70 % predicated max but her hip limitations were also notable ? ?No bleeding on Eliquis. ? ?No chest pain or shortness of breath.  Her limitations are largely orthopedic.  She is scheduled for hip replacement in a few months and her PCP felt she needed to be seen by cardiology/EP because of her bradycardia and atrial fibrillation making her more high risk. ?  ?She has some balance issues.  No presyncope apart from balance and issues related to hip pain ?Date Cr K LDL Hgb  ?9/20 1.07 3.6 211 12.7  ? 7/21 1.21 4.2 123 12.4  ?11/22 (CE)  1.25 4.7  12.8  ?  ?  ?DATE TEST EF   ?2015 Cath    70 %   ?4/18 Myoview  60 % No ischemia  ?9/20 CTA  FFR LCx 0.82//RCA 0.88   ?3./22 Myoview  >65% No perfusion defects  ?  ? ?   ? ?Past Medical History:  ?Diagnosis Date  ? CVA (cerebral vascular accident) ([place])  2019  ? Diabetes mellitus type 2 in nonobese [facility])   ? Hyperlipidemia   ? Hypertension   ? PCP GAVE FOR HER  BLOOD PRESSURE  ? IBS (irritable bowel syndrome)   ? PUD (peptic ulcer disease)   ? TIA (transient ischemic attack) 2006  ? ? ?Past Surgical History:  ?Procedure Laterality Date  ? ABDOMINAL HYSTERECTOMY    ? BIOPSY THYROID  Jan. 2016  ? LAPAROSCOPY N/A 08/25/2015  ? Procedure: EXPLORATORY LAPAROTOMY, ILEOCECECTOMY, PRIMARY ANASTOMOSIS, LYSIS OF ADHESIONS;  Surgeon: Autumn Messing III, MD;  Location: [facility] ORS;  Service: General;  Laterality: N/A;  ? laparoscopy for fertility work up    ? LOOP RECORDER IMPLANT N/A 03/24/2014  ? Procedure: LOOP RECORDER IMPLANT;  Surgeon: Deboraha Sprang, MD;  Location: [facility] CATH LAB;  Service: Cardiovascular;  Laterality: N/A;  ? LUMBAR LAMINECTOMY/DECOMPRESSION MICRODISCECTOMY  04/25/2011  ? Procedure: LUMBAR LAMINECTOMY/DECOMPRESSION MICRODISCECTOMY;  Surgeon: [name], MD;  Location: [place] NEURO ORS;  Service: Neurosurgery;  Laterality: Right;  Right Lumbar Four-Five Discectomy  ? OOPHORECTOMY    ? TONSILLECTOMY    ? ? ?Current Outpatient Medications  ?Medication Sig Dispense Refill  ? amLODipine (NORVASC) 2.5 MG tablet Take 1 tablet by mouth once daily 90 tablet 3  ? apixaban (ELIQUIS) 5 MG TABS tablet Take 1 tablet (5 mg  total) by mouth 2 (two) times daily. 60 tablet 0  ? azelastine (ASTELIN) 0.1 % nasal spray Place into the nose as needed.    ? Coenzyme Q10 (COQ10) 100 MG CAPS Take 1 tablet by mouth daily.    ? isosorbide mononitrate (IMDUR) 60 MG 24 hr tablet Take 1 tablet (60 mg total) by mouth daily. 90 tablet 3  ? losartan (COZAAR) 50 MG tablet Take 1 tablet by mouth once daily 90 tablet 0  ? Multiple Vitamins-Minerals (ZINC PO) Take 30 mg by mouth daily.    ? omeprazole (PRILOSEC) 10 MG capsule Take by mouth.    ? propranolol (INDERAL) 10 MG tablet Take 1 tablet (10 mg total) by mouth every 4 (four) hours as needed. 60 tablet 2  ? rosuvastatin (CRESTOR) 5 MG tablet Take 1  tablet (5 mg total) by mouth daily. 90 tablet 3  ? TURMERIC PO Take 1 tablet by mouth daily.    ? VITAMIN D PO Take 25 mcg by mouth daily.    ? Zinc 30 MG CAPS Take by mouth daily.    ? ?No current facility-administered medications for this visit.  ? ? ?Allergies  ?Allergen Reactions  ? Codeine Nausea Only  ? Morphine And Related Itching  ?  Itching ?  ? ? ? ? ?Review of Systems negative except from HPI and PMH ? ?Physical Exam ?BP 120/68   Pulse (!) 46   Ht '5\' 6"'$  (1.676 m)   Wt 160 lb 4.8 oz (72.7 kg)   SpO2 98%   BMI 25.87 kg/m?  ?Well developed and nourished in no acute distress ?HENT normal ?Neck supple with JVP-  fla ?Clear ?Regular rate and rhythm, no murmurs or gallops ?Abd-soft with active BS ?No Clubbing cyanosis edema ?Skin-warm and dry ?A & Oriented  Grossly normal sensory and motor function ? ?ECG sinus at 46 ?Interval 14/08/44 ?Otherwise normal ? ?  ? ?Assessment and  Plan ?Cryptogenic stroke ? ?Implantable loop recorder at RRT ? ?Hypertension ? ?Sinus bradycardia ? ?Chest discomfort  Exertional ? ?Atrial fibrillation-recurrent ? ?Emotional Lability memory loss ? ?Patient has asymptomatic bradycardia and some atrial fibrillation.  Neither of these I think should be precluding of her acceptable surgical risk for hip replacement.  Moreover, she has a negative Myoview just about a year ago.  These are very important predictors for low risk outcomes ? ?We will have her follow-up with [facility] lives in case or any untoward events that occur in the interim; ? ?Her balance is really poor.  She almost fell getting out of the chair.  I recommended that between now and when she gets her hip replaced she think about getting a walker.  (Obviously I do not have a clue as to what I am talking about but?) ? ?Blood pressure is well controlled.  We will continue her losartan 50 isosorbide 60 and amlodipine 2.5. ? ?No bleeding.  We will continue the Eliquis 5 mg twice daily ? ?

## 2021-06-05 NOTE — Patient Instructions (Signed)
Medication Instructions:  ?Your physician recommends that you continue on your current medications as directed. Please refer to the Current Medication list given to you today. ? ?*If you need a refill on your cardiac medications before your next appointment, please call your pharmacy* ? ? ?Lab Work: ?None ordered. ? ?If you have labs (blood work) drawn today and your tests are completely normal, you will receive your results only by: ?MyChart Message (if you have MyChart) OR ?A paper copy in the mail ?If you have any lab test that is abnormal or we need to change your treatment, we will call you to review the results. ? ? ?Testing/Procedures: ?None ordered. ? ? ? ?Follow-Up: ?At [facility], you and your health needs are our priority.  As part of our continuing mission to provide you with exceptional heart care, we have created designated Provider Care Teams.  These Care Teams include your primary Cardiologist (physician) and Advanced Practice Providers (APPs -  Physician Assistants and Nurse Practitioners) who all work together to provide you with the care you need, when you need it. ? ?We recommend signing up for the patient portal called "MyChart".  Sign up information is provided on this After Visit Summary.  MyChart is used to connect with patients for Virtual Visits (Telemedicine).  Patients are able to view lab/test results, encounter notes, upcoming appointments, etc.  Non-urgent messages can be sent to your provider as well.   ?To learn more about what you can do with MyChart, go to [email or web address].   ? ?Your next appointment:   ?Follow up with Estella Husk, PA-C as scheduled ?

## 2021-06-17 ENCOUNTER — Other Ambulatory Visit: Payer: Self-pay | Admitting: Internal Medicine

## 2021-06-29 NOTE — Progress Notes (Deleted)
? ?Cardiology Office Note   ? ?Date:  06/29/2021  ? ?ID:  Tiffany Wiggins, DOB 04-17-1945, MRN [id number] ? ? ?PCP:  Raina Mina., MD ?  ?[place]  ?Cardiologist:  Fransico Him, MD *** ?Advanced Practice Provider:  No care team member to display ?Electrophysiologist:  Virl Axe, MD  ? ?[id number]}  ? ?No chief complaint on file. ? ? ?History of Present Illness:  ?Tiffany Wiggins is a 76 y.o. female  with history of cryptogenic CVA and found to have Afib and now on Eliquis followed by Dr. Caryl Comes. Some asymptomatic bradycardia. Also has HTN, HLD, history of chest pain with negative NST 3/18//22. ? ?Patient saw Dr. Caryl Comes 06/05/21 and was cleared for hip surgery. Was off balance and at risk for falls and recommended using a walker.  ? ?She was placed on my schedule to make sure no changes prior to hip surgery scheduled 09/06/21 by Dr. Wynelle Link. ? ? ? ?Past Medical History:  ?Diagnosis Date  ? CVA (cerebral vascular accident) ([place]) 2019  ? Diabetes mellitus type 2 in nonobese [facility])   ? Hyperlipidemia   ? Hypertension   ? PCP GAVE FOR HER  BLOOD PRESSURE  ? IBS (irritable bowel syndrome)   ? PUD (peptic ulcer disease)   ? TIA (transient ischemic attack) 2006  ? ? ?Past Surgical History:  ?Procedure Laterality Date  ? ABDOMINAL HYSTERECTOMY    ? BIOPSY THYROID  Jan. 2016  ? LAPAROSCOPY N/A 08/25/2015  ? Procedure: EXPLORATORY LAPAROTOMY, ILEOCECECTOMY, PRIMARY ANASTOMOSIS, LYSIS OF ADHESIONS;  Surgeon: Autumn Messing III, MD;  Location: [facility] ORS;  Service: General;  Laterality: N/A;  ? laparoscopy for fertility work up    ? LOOP RECORDER IMPLANT N/A 03/24/2014  ? Procedure: LOOP RECORDER IMPLANT;  Surgeon: Deboraha Sprang, MD;  Location: [facility] CATH LAB;  Service: Cardiovascular;  Laterality: N/A;  ? LUMBAR LAMINECTOMY/DECOMPRESSION MICRODISCECTOMY  04/25/2011  ? Procedure: LUMBAR LAMINECTOMY/DECOMPRESSION MICRODISCECTOMY;  Surgeon: [name], MD;  Location: [place] NEURO ORS;  Service: Neurosurgery;  Laterality:  Right;  Right Lumbar Four-Five Discectomy  ? OOPHORECTOMY    ? TONSILLECTOMY    ? ? ?Current Medications: ?No outpatient medications have been marked as taking for the 07/11/21 encounter (Appointment) with Imogene Burn, PA-C.  ?  ? ?Allergies:   Codeine and Morphine and related  ? ?Social History  ? ?Socioeconomic History  ? Marital status: Married  ?  Spouse name: Not on file  ? Number of children: Not on file  ? Years of education: Not on file  ? Highest education level: Not on file  ?Occupational History  ? Occupation: retired  ?Tobacco Use  ? Smoking status: Former  ?  Types: Cigarettes  ?  Quit date: 04/02/2004  ?  Years since quitting: 17.2  ? Smokeless tobacco: Never  ?Vaping Use  ? Vaping Use: Never used  ?Substance and Sexual Activity  ? Alcohol use: Yes  ?  Alcohol/week: 2.0 standard drinks  ?  Types: 2 Glasses of wine per week  ? Drug use: No  ? Sexual activity: Not on file  ?Other Topics Concern  ? Not on file  ?Social History Narrative  ? Not on file  ? ?Social Determinants of Health  ? ?Financial Resource Strain: Not on file  ?Food Insecurity: Not on file  ?Transportation Needs: Not on file  ?Physical Activity: Not on file  ?Stress: Not on file  ?Social Connections: Not on file  ?  ? ?  Family History:  The patient's ***family history includes Cancer in her brother and sister; Colon cancer in her paternal uncle; Diabetes in her sister.  ? ?ROS:   ?Please see the history of present illness.    ?ROS All other systems reviewed and are negative. ? ? ?PHYSICAL EXAM:   ?VS:  There were no vitals taken for this visit.  ?Physical Exam  ?GEN: Well nourished, well developed, in no acute distress  ?HEENT: normal  ?Neck: no JVD, carotid bruits, or masses ?Cardiac:RRR; no murmurs, rubs, or gallops  ?Respiratory:  clear to auscultation bilaterally, normal work of breathing ?GI: soft, nontender, nondistended, + BS ?Ext: without cyanosis, clubbing, or edema, Good distal pulses bilaterally ?MS: no deformity or atrophy   ?Skin: warm and dry, no rash ?Neuro:  Alert and Oriented x 3, Strength and sensation are intact ?Psych: euthymic mood, full affect ? ?Wt Readings from Last 3 Encounters:  ?06/05/21 160 lb 4.8 oz (72.7 kg)  ?03/08/21 163 lb (73.9 kg)  ?12/20/20 165 lb (74.8 kg)  ?  ? ? ?Studies/Labs Reviewed:  ? ?EKG:  EKG is*** ordered today.  The ekg ordered today demonstrates *** ? ?Recent Labs: ?No results found for requested labs within last 8760 hours.  ? ?Lipid Panel ?   ?Component Value Date/Time  ? CHOL 289 (H) 12/08/2018 0913  ? TRIG 117 12/08/2018 0913  ? HDL 56 12/08/2018 0913  ? CHOLHDL 5.2 12/08/2018 0913  ? VLDL 23 12/08/2018 0913  ? [place] 210 (H) 12/08/2018 0913  ? LDLDIRECT 167.0 07/19/2015 1108  ? ? ?Additional studies/ records that were reviewed today include:  ?NST 06/17/20 ?Nuclear stress EF: 71%. ?The left ventricular ejection fraction is hyperdynamic (>65%). ?There was no ST segment deviation noted during stress. ?The study is normal. ?This is a low risk study. ?  ?Normal stress nuclear study with no ischemia or infarction; gated EF 71% with normal wall motion.  ?  ? ? ?Risk Assessment/Calculations:   ?{Does this patient have ATRIAL FIBRILLATION?:[phone]} ? ? ? ? ?ASSESSMENT:   ? ?No diagnosis found. ? ? ?PLAN:  ?In order of problems listed above: ? ?Preop clearance for hip surgery by Dr. Wynelle Link 09/06/21 already cleared by Dr. Caryl Comes at 06/05/21. No change in symptoms since then. Because of high risk of CVA Dr. Caryl Comes needs tor make decision on lovenox bridge per pharmacy team. ? ?History of chest pain-normal NST 05/2020 ? ?HTN ? ?Cryptogenic stroke  ? ?PAF on eliquis-some asymptomatic bradycardia ? ?Shared Decision Making/Informed Consent   ?{Are you ordering a CV Procedure (e.g. stress test, cath, DCCV, TEE, etc)?   Press F2        :[id number]}  ? ? ?Medication Adjustments/Labs and Tests Ordered: ?Current medicines are reviewed at length with the patient today.  Concerns regarding medicines are outlined above.   Medication changes, Labs and Tests ordered today are listed in the Patient Instructions below. ?There are no Patient Instructions on file for this visit.  ? ?Signed, ?Ermalinda Barrios, PA-C  ?06/29/2021 9:09 AM    ?[facility] ?[address], [place], [place]  [address] ?Phone: [phone]; Fax: [phone]  ? ? ?

## 2021-07-06 ENCOUNTER — Ambulatory Visit: Payer: Medicare Other | Admitting: Cardiology

## 2021-07-06 ENCOUNTER — Telehealth: Payer: Self-pay | Admitting: Internal Medicine

## 2021-07-06 ENCOUNTER — Encounter: Payer: Self-pay | Admitting: Cardiology

## 2021-07-06 VITALS — BP 160/80 | HR 98 | Ht 66.0 in | Wt 166.6 lb

## 2021-07-06 DIAGNOSIS — I480 Paroxysmal atrial fibrillation: Secondary | ICD-10-CM

## 2021-07-06 DIAGNOSIS — I2510 Atherosclerotic heart disease of native coronary artery without angina pectoris: Secondary | ICD-10-CM

## 2021-07-06 DIAGNOSIS — N1831 Chronic kidney disease, stage 3a: Secondary | ICD-10-CM

## 2021-07-06 DIAGNOSIS — Z8673 Personal history of transient ischemic attack (TIA), and cerebral infarction without residual deficits: Secondary | ICD-10-CM

## 2021-07-06 DIAGNOSIS — E118 Type 2 diabetes mellitus with unspecified complications: Secondary | ICD-10-CM | POA: Diagnosis not present

## 2021-07-06 MED ORDER — AMLODIPINE BESYLATE 5 MG PO TABS: 5.0000 mg | ORAL_TABLET | Freq: Every day | ORAL | 3 refills | Status: DC

## 2021-07-06 MED ORDER — NITROGLYCERIN 0.4 MG SL SUBL: 0.4000 mg | SUBLINGUAL_TABLET | SUBLINGUAL | 2 refills | Status: DC | PRN

## 2021-07-06 MED ORDER — FUROSEMIDE 20 MG PO TABS: 20.0000 mg | ORAL_TABLET | Freq: Every day | ORAL | 3 refills | Status: DC

## 2021-07-06 NOTE — Patient Instructions (Signed)
Medication Instructions:  ?Your physician has recommended you make the following change in your medication:  ? ?START: Nitroglycerin 0.4 mg place under the tongue every 5 minutes as needed for chest pain ?START: Lasix 20 mg daily ?START: Amlodipine 5 mg daily ? ?*If you need a refill on your cardiac medications before your next appointment, please call your pharmacy* ? ? ?Lab Work: ?Your physician recommends that you return for lab work in:  ? ?Labs today: BMP, Pro BNP, Troponin I ?Labs in 1 week: BMP ? ?If you have labs (blood work) drawn today and your tests are completely normal, you will receive your results only by: ?MyChart Message (if you have MyChart) OR ?A paper copy in the mail ?If you have any lab test that is abnormal or we need to change your treatment, we will call you to review the results. ? ? ?Testing/Procedures: ?None ? ? ?Follow-Up: ?At [facility], you and your health needs are our priority.  As part of our continuing mission to provide you with exceptional heart care, we have created designated Provider Care Teams.  These Care Teams include your primary Cardiologist (physician) and Advanced Practice Providers (APPs -  Physician Assistants and Nurse Practitioners) who all work together to provide you with the care you need, when you need it. ? ?We recommend signing up for the patient portal called "MyChart".  Sign up information is provided on this After Visit Summary.  MyChart is used to connect with patients for Virtual Visits (Telemedicine).  Patients are able to view lab/test results, encounter notes, upcoming appointments, etc.  Non-urgent messages can be sent to your provider as well.   ?To learn more about what you can do with MyChart, go to [email or web address].   ? ?Your next appointment:   ?4 week(s) ? ?The format for your next appointment:   ?In Person ? ?Provider:   ?Jenne Campus, MD  ? ? ?Other Instructions ?None ? ?

## 2021-07-06 NOTE — Telephone Encounter (Signed)
Pt c/o swelling: STAT is pt has developed SOB within 24 hours ? ?If swelling, where is the swelling located? Feet, legs  and ankles ? ?How much weight have you gained and in what time span? Not sure. Patient does not weigh herself daily  ? ?Have you gained 3 pounds in a day or 5 pounds in a week? Not sure ? ?Do you have a log of your daily weights (if so, list)? no ? ?Are you currently taking a fluid pill? no ? ?Are you currently SOB? no ? ?Have you traveled recently?  ? ?Pt c/o of Chest Pain: STAT if CP now or developed within 24 hours ? ?1. Are you having CP right now? no ? ?2. Are you experiencing any other symptoms (ex. SOB, nausea, vomiting, sweating)? Fatigue, headache,   ? ?3. How long have you been experiencing CP? happened last night. Started about 8:00 pm but was able to fall asleep around 12:30  ? ?4. Is your CP continuous or coming and going? Continuous  ? ?5. Have you taken Nitroglycerin? No, patient did take propranolol (INDERAL) 10 MG tablet a and the symptoms resolved after about 15 minutes   ??  ?

## 2021-07-06 NOTE — Progress Notes (Signed)
?Cardiology Office Note:   ? ?Date:  07/06/2021  ? ?ID:  ODESTER NILSON, DOB [birth year], MRN [id number] ? ?PCP:  Raina Mina., MD  ?Cardiologist:  Jenne Campus, MD   ? ?Referring MD: Raina Mina., MD  ? ?Chief Complaint  ?Patient presents with  ? Chest Pain  ? Leg Swelling  ? ? ?History of Present Illness:   ? ?Tiffany Wiggins is a 76 y.o. female with past medical history significant for coronary artery disease, 2 years ago she did have coronary CT angio done which showed heavily calcified vessels, fractional flow reserve did not show any target lesion for intervention also a year ago she did have stress test which was negative, she also got history of essential hypertension, dyslipidemia, diabetes.  She requested to be seen because for about a month she has been experiencing swelling of lower extremities it worse especially at evening time.  On top of that yesterday she got a constellation of very interesting symptoms which include chest pain.  She described pain located in the upper chest and some some also in the lower sternum.  Her husband check her blood pressure and heart rate noticed heart rate being elevated 112 blood pressure being elevated and he gave her 1 propranolol which seems to calm the situation down.  However to get somewhat alarmed by that that he decided to come to our office and be seen today.  At the moment of my interview lady seems to be asymptomatic and doing quite well.  Smiling and denies having any issue. ? ?Past Medical History:  ?Diagnosis Date  ? CVA (cerebral vascular accident) ([place]) 2019  ? Diabetes mellitus type 2 in nonobese [facility])   ? Hyperlipidemia   ? Hypertension   ? PCP GAVE FOR HER  BLOOD PRESSURE  ? IBS (irritable bowel syndrome)   ? PUD (peptic ulcer disease)   ? TIA (transient ischemic attack) 2006  ? ? ?Past Surgical History:  ?Procedure Laterality Date  ? ABDOMINAL HYSTERECTOMY    ? BIOPSY THYROID  Jan. 2016  ? LAPAROSCOPY N/A 08/25/2015  ? Procedure: EXPLORATORY  LAPAROTOMY, ILEOCECECTOMY, PRIMARY ANASTOMOSIS, LYSIS OF ADHESIONS;  Surgeon: Autumn Messing III, MD;  [facility]: [facility]S;  Service: General;  Laterality: N/A;  ? laparoscopy for fertility work up    ? LOOP RECORDER IMPLANT N/A 03/24/2014  ? Procedure: LOOP RECORDER IMPLANT;  Surgeon: Deboraha Sprang, MD;  Location: [facility] CATH LAB;  Service: Cardiovascular;  Laterality: N/A;  ? LUMBAR LAMINECTOMY/DECOMPRESSION MICRODISCECTOMY  04/25/2011  ? Procedure: LUMBAR LAMINECTOMY/DECOMPRESSION MICRODISCECTOMY;  Surgeon: [name], MD;  Location: [place] NEURO ORS;  Service: Neurosurgery;  Laterality: Right;  Right Lumbar Four-Five Discectomy  ? OOPHORECTOMY    ? TONSILLECTOMY    ? ? ?Current Medications: ?Current Meds  ?Medication Sig  ? amLODipine (NORVASC) 2.5 MG tablet Take 1 tablet by mouth once daily  ? apixaban (ELIQUIS) 5 MG TABS tablet Take 1 tablet (5 mg total) by mouth 2 (two) times daily.  ? azelastine (ASTELIN) 0.1 % nasal spray Place into the nose as needed.  ? Coenzyme Q10 (COQ10) 100 MG CAPS Take 1 tablet by mouth daily.  ? isosorbide mononitrate (IMDUR) 60 MG 24 hr tablet Take 1 tablet (60 mg total) by mouth daily.  ? losartan (COZAAR) 50 MG tablet Take 1 tablet by mouth once daily  ? Multiple Vitamins-Minerals (ZINC PO) Take 30 mg by mouth daily.  ? omeprazole (PRILOSEC) 10 MG capsule Take by mouth.  ?  propranolol (INDERAL) 10 MG tablet Take 1 tablet (10 mg total) by mouth every 4 (four) hours as needed.  ? rosuvastatin (CRESTOR) 5 MG tablet Take 1 tablet (5 mg total) by mouth daily.  ? TURMERIC PO Take 1 tablet by mouth daily.  ? VITAMIN D PO Take 25 mcg by mouth daily.  ? Zinc 30 MG CAPS Take by mouth daily.  ?  ? ?Allergies:   Codeine and Morphine and related  ? ?Social History  ? ?Socioeconomic History  ? Marital status: Married  ?  Spouse name: Not on file  ? Number of children: Not on file  ? Years of education: Not on file  ? Highest education level: Not on file  ?Occupational History  ? Occupation: retired   ?Tobacco Use  ? Smoking status: Former  ?  Types: Cigarettes  ?  Quit date: 04/02/2004  ?  Years since quitting: 17.2  ? Smokeless tobacco: Never  ?Vaping Use  ? Vaping Use: Never used  ?Substance and Sexual Activity  ? Alcohol use: Yes  ?  Alcohol/week: 2.0 standard drinks  ?  Types: 2 Glasses of wine per week  ? Drug use: No  ? Sexual activity: Not on file  ?Other Topics Concern  ? Not on file  ?Social History Narrative  ? Not on file  ? ?Social Determinants of Health  ? ?Financial Resource Strain: Not on file  ?Food Insecurity: Not on file  ?Transportation Needs: Not on file  ?Physical Activity: Not on file  ?Stress: Not on file  ?Social Connections: Not on file  ?  ? ?Family History: ?The patient's family history includes Cancer in her brother and sister; Colon cancer in her paternal uncle; Diabetes in her sister. ?ROS:   ?Please see the history of present illness.    ?All 14 point review of systems negative except as described per history of present illness ? ?EKGs/Labs/Other Studies Reviewed:   ? ? ? ?Recent Labs: ?No results found for requested labs within last 8760 hours.  ?Recent Lipid Panel ?   ?Component Value Date/Time  ? CHOL 289 (H) 12/08/2018 0913  ? TRIG 117 12/08/2018 0913  ? HDL 56 12/08/2018 0913  ? CHOLHDL 5.2 12/08/2018 0913  ? VLDL 23 12/08/2018 0913  ? [place] 210 (H) 12/08/2018 0913  ? LDLDIRECT 167.0 07/19/2015 1108  ? ? ?Physical Exam:   ? ?VS:  BP (!) 160/80 (BP Location: Left Arm, Patient Position: Sitting)   Pulse 98   Ht '5\' 6"'$  (1.676 m)   Wt 166 lb 9.6 oz (75.6 kg)   SpO2 96%   BMI 26.89 kg/m?    ? ?Wt Readings from Last 3 Encounters:  ?07/06/21 166 lb 9.6 oz (75.6 kg)  ?06/05/21 160 lb 4.8 oz (72.7 kg)  ?03/08/21 163 lb (73.9 kg)  ?  ? ?GEN:  Well nourished, well developed in no acute distress ?HEENT: Normal ?NECK: No JVD; No carotid bruits ?LYMPHATICS: No lymphadenopathy ?CARDIAC: RRR, no murmurs, no rubs, no gallops ?RESPIRATORY:  Clear to auscultation without rales, wheezing or  rhonchi  ?ABDOMEN: Soft, non-tender, non-distended ?MUSCULOSKELETAL:  No edema; No deformity  ?SKIN: Warm and dry ?LOWER EXTREMITIES: no swelling ?NEUROLOGIC:  Alert and oriented x 3 ?PSYCHIATRIC:  Normal affect  ? ?ASSESSMENT:   ? ?1. Coronary artery disease involving native coronary artery of native heart without angina pectoris   ?2. Paroxysmal A-fib ([place])   ?3. Controlled type 2 diabetes mellitus with complication, without long-term current use of insulin ([place])   ?  4. Stage 3a chronic kidney disease ([place])   ?5. History of CVA (cerebrovascular accident)   ? ?PLAN:   ? ?In order of problems listed above: ? ?Coronary artery disease known diffuse atherosclerosis in the coronaries with last evaluation done about a year ago with stress test showing no evidence of ischemia.  She did have some symptomatology which is concerning however somewhat atypical.  Her EKG today did not show any evidence of myocardial infarction there is no ST segment changes there is significant bradycardia therefore I caution her husband against giving her too much of beta-blocker.  I think she can benefit from nitroglycerin as needed.  I will also ask her to have troponin I done today.  If troponin I will be abnormal then we may consider more aggressive approach including cardiac catheterization, if troponin is normal then medical therapy will be appropriate options.  She is already on Imdur 60 which I will continue.  Obviously beta-blocker we have to be careful she is also on amlodipine which is calcium channel blocker which I will increase today to 5 mg daily to help with the blood pressure. ?Swelling of lower extremities secondary to which is worse right now she is taking amlodipine which I will increase to limit today.  I will do proBNP today I will schedule her to have echocardiogram to assess left ventricle ejection fraction.  I will also give her small dose of diuretic, she will be 20 mg of Lasix every single day.  And next week we will  recheck her Chem-7 make sure it safe to continue with this management.  She does have some baseline kidney dysfunction with the last creatinine 1.25 from few months ago. ? ? ?Medication Adjustments/Labs and Tests

## 2021-07-06 NOTE — Telephone Encounter (Signed)
Spoke with pt's husband, DPR who reports CP and left shoulder pain last night.  Pt rated pain as an 8 on the pain scale.  BP at that time was 160/71 with HR of 78.  She also complains of increased fatigue and bilateral LEE greater on right side.  Pt denies redness, warmth or calf pain in either leg.  Pt's husband states he gave pt a Propranolol '10mg'$  and pain resolved in about 20-30 minutes and pt was able to fall asleep. ?Pt denies current CP or shoulder pain.  She continues to complain of fatigue. ?Appointment scheduled with Dr Agustin Cree in our [place] office at the request of the pt's husband for convenience.  Appointment today 07/06/2021 at 1pm.  Reviewed ED precautions.  Pt and pt's husband verbalize understanding and agree with current plan. ?

## 2021-07-07 LAB — BASIC METABOLIC PANEL
BUN/Creatinine Ratio: 20 (ref 12–28)
BUN: 21 mg/dL (ref 8–27)
CO2: 25 mmol/L (ref 20–29)
Calcium: 9.7 mg/dL (ref 8.7–10.3)
Chloride: 108 mmol/L — ABNORMAL HIGH (ref 96–106)
Creatinine, Ser: 1.03 mg/dL — ABNORMAL HIGH (ref 0.57–1.00)
Glucose: 105 mg/dL — ABNORMAL HIGH (ref 70–99)
Potassium: 4.5 mmol/L (ref 3.5–5.2)
Sodium: 148 mmol/L — ABNORMAL HIGH (ref 134–144)
eGFR: 57 mL/min/{1.73_m2} — ABNORMAL LOW (ref >59)

## 2021-07-07 LAB — TROPONIN T: Troponin T (Highly Sensitive): 10 ng/L (ref 0–14)

## 2021-07-07 LAB — PRO B NATRIURETIC PEPTIDE: NT-Pro BNP: 338 pg/mL — ABNORMAL HIGH (ref 0–301)

## 2021-07-10 ENCOUNTER — Encounter: Payer: Self-pay | Admitting: Cardiology

## 2021-07-11 ENCOUNTER — Ambulatory Visit: Payer: Medicare Other | Admitting: Physician Assistant

## 2021-07-11 ENCOUNTER — Other Ambulatory Visit: Payer: Self-pay

## 2021-07-11 DIAGNOSIS — R079 Chest pain, unspecified: Secondary | ICD-10-CM

## 2021-07-11 NOTE — Telephone Encounter (Signed)
Tiffany Liter, MD  You 1 minute ago (2:48 PM)  ? ?She need to have an echocardiogram schedule    ? ?

## 2021-07-11 NOTE — Telephone Encounter (Signed)
Does patient need another appt with Dr. Agustin Cree? She was just seen on 04/06 and now needs preop clearance. Please advise.  ?

## 2021-07-11 NOTE — Progress Notes (Signed)
Troponin I negative, labs are looking good, continue present management

## 2021-07-13 ENCOUNTER — Telehealth: Payer: Self-pay

## 2021-07-13 NOTE — Telephone Encounter (Signed)
Pt wanted to make sure that his appointment with Dr. Agustin Cree was all they needed for pt's preop. Advised that I will route a message to the preop pool so that they are aware and alert them pt is having an echo next week. Advised that if any additional appointments are needed someone would notify them. Tiffany Wiggins verbalized understanding and had no additional questions. ?

## 2021-07-17 NOTE — Telephone Encounter (Signed)
Preop pool received forwarded note on 08/2020 clearance that patient had seen Dr. Agustin Cree and was planning to have echocardiogram 07/19/21. It looks like a more recent clearance request for hip surgery was entered 05/08/2021 in which it was recommended that the patient see MD to finalize preop clearance, as the patient was not appropriate at that time to be cleared by phone by preop APP. Per office protocol, the provider seeing this patient should forward their finalized clearance decision to requesting party below so would please communicate with Dr. Agustin Cree upon completion of the echo what his final recommendations are and please fax to the requesting surgeon. Thanks! ?

## 2021-07-19 ENCOUNTER — Ambulatory Visit (INDEPENDENT_AMBULATORY_CARE_PROVIDER_SITE_OTHER): Payer: Medicare Other

## 2021-07-19 DIAGNOSIS — R079 Chest pain, unspecified: Secondary | ICD-10-CM

## 2021-07-19 LAB — ECHOCARDIOGRAM COMPLETE
Area-P 1/2: 2.52 cm2
S' Lateral: 2.5 cm

## 2021-08-09 ENCOUNTER — Encounter: Payer: Self-pay | Admitting: Cardiology

## 2021-08-09 ENCOUNTER — Encounter: Payer: Self-pay | Admitting: *Deleted

## 2021-08-09 ENCOUNTER — Ambulatory Visit: Payer: Medicare Other | Admitting: Cardiology

## 2021-08-09 ENCOUNTER — Telehealth: Payer: Self-pay | Admitting: *Deleted

## 2021-08-09 VITALS — BP 148/82 | HR 47 | Ht 66.0 in | Wt 166.0 lb

## 2021-08-09 DIAGNOSIS — I2510 Atherosclerotic heart disease of native coronary artery without angina pectoris: Secondary | ICD-10-CM | POA: Diagnosis not present

## 2021-08-09 DIAGNOSIS — Z01810 Encounter for preprocedural cardiovascular examination: Secondary | ICD-10-CM

## 2021-08-09 DIAGNOSIS — I480 Paroxysmal atrial fibrillation: Secondary | ICD-10-CM

## 2021-08-09 DIAGNOSIS — E785 Hyperlipidemia, unspecified: Secondary | ICD-10-CM

## 2021-08-09 DIAGNOSIS — E118 Type 2 diabetes mellitus with unspecified complications: Secondary | ICD-10-CM

## 2021-08-09 NOTE — Telephone Encounter (Signed)
Left message on voicemail per DPR in reference to upcoming appointment scheduled on 08/15/21 at 0800 with detailed instructions given per Myocardial Perfusion Study Information Sheet for the test. LM to arrive 15 minutes early, and that it is imperative to arrive on time for appointment to keep from having the test rescheduled. If you need to cancel or reschedule your appointment, please call the office within 24 hours of your appointment. Failure to do so may result in a cancellation of your appointment, and a $50 no show fee. Phone number given for call back for any questions.  ?Mychart letter sent Marieke Lubke, Ranae Palms ? ?

## 2021-08-09 NOTE — Patient Instructions (Signed)
Medication Instructions:  ?Your physician recommends that you continue on your current medications as directed. Please refer to the Current Medication list given to you today. ? ?*If you need a refill on your cardiac medications before your next appointment, please call your pharmacy* ? ? ?Lab Work: ?Your physician recommends that you return for lab work in:  ? ?Labs today: BMP ? ?If you have labs (blood work) drawn today and your tests are completely normal, you will receive your results only by: ?MyChart Message (if you have MyChart) OR ?A paper copy in the mail ?If you have any lab test that is abnormal or we need to change your treatment, we will call you to review the results. ? ? ?Testing/Procedures: ? ? ?[facility] ?[address] ?[place], [place] [address] ?Phone:  [phone] ? ? ? ?Please arrive 15 minutes prior to your appointment time for registration and insurance purposes. ? ?The test will take approximately 3 to 4 hours to complete; you may bring reading material.  If someone comes with you to your appointment, they will need to remain in the main lobby due to limited space in the testing area. **If you are pregnant or breastfeeding, please notify the nuclear lab prior to your appointment** ? ?How to prepare for your Myocardial Perfusion Test: ?Do not eat or drink 3 hours prior to your test, except you may have water. ?Do not consume products containing caffeine (regular or decaffeinated) 12 hours prior to your test. (ex: coffee, chocolate, sodas, tea). ?Do bring a list of your current medications with you.  If not listed below, you may take your medications as normal. ? ?Do wear comfortable clothes (no dresses or overalls) and walking shoes, tennis shoes preferred (No heels or open toe shoes are allowed). ?Do NOT wear cologne, perfume, aftershave, or lotions (deodorant is allowed). ?If these instructions are not followed, your test will have to be rescheduled. ? ?Please  report to [address] for your test.  If you have questions or concerns about your appointment, you can call the [facility] at [phone]. ? ?If you cannot keep your appointment, please provide 24 hours notification to the Nuclear Lab, to avoid a possible $50 charge to your account. ? ? ? ?Follow-Up: ?At [facility], you and your health needs are our priority.  As part of our continuing mission to provide you with exceptional heart care, we have created designated Provider Care Teams.  These Care Teams include your primary Cardiologist (physician) and Advanced Practice Providers (APPs -  Physician Assistants and Nurse Practitioners) who all work together to provide you with the care you need, when you need it. ? ?We recommend signing up for the patient portal called "MyChart".  Sign up information is provided on this After Visit Summary.  MyChart is used to connect with patients for Virtual Visits (Telemedicine).  Patients are able to view lab/test results, encounter notes, upcoming appointments, etc.  Non-urgent messages can be sent to your provider as well.   ?To learn more about what you can do with MyChart, go to [email or web address].   ? ?Your next appointment:   ?6 month(s) ? ?The format for your next appointment:   ?In Person ? ?Provider:   ?Jenne Campus, MD  ? ? ?Other Instructions ?None ? ?Important Information About Sugar ? ? ? ? ? ? ?

## 2021-08-09 NOTE — Progress Notes (Signed)
?Cardiology Office Note:   ? ?Date:  08/09/2021  ? ?ID:  Tiffany Wiggins, DOB February 01, 1946, MRN [id number] ? ?PCP:  Raina Mina., MD  ?Cardiologist:  Jenne Campus, MD   ? ?Referring MD: Raina Mina., MD  ? ?Chief Complaint  ?Patient presents with  ? Follow-up  ? Medication Management  ? ? ?History of Present Illness:   ? ?Tiffany Wiggins is a 76 y.o. female   with past medical history significant for coronary artery disease, 2 years ago she did have coronary CT angio done which showed heavily calcified vessels, fractional flow reserve did not show any target lesion for intervention also a year ago she did have stress test which was negative, she also got history of essential hypertension, dyslipidemia, diabetes. ?Last time I seen her she was having some nonspecific symptoms including some shortness of breath as well as swelling of lower extremities.  We end up doing echocardiogram which showed preserved left ventricle ejection fraction and since that time she seems to be doing well.  She denies have any chest pain tightness squeezing pressure burning chest no palpitations dizziness.  She is scheduled to have hip surgery done.  She overall is relatively low risk procedure however with her calcification of multiple coronary arteries I think we obligated to do ischemia work-up.  She will be scheduled to have a stress test. ? ?Past Medical History:  ?Diagnosis Date  ? CVA (cerebral vascular accident) ([place]) 2019  ? Diabetes mellitus type 2 in nonobese [facility])   ? Hyperlipidemia   ? Hypertension   ? PCP GAVE FOR HER  BLOOD PRESSURE  ? IBS (irritable bowel syndrome)   ? PUD (peptic ulcer disease)   ? TIA (transient ischemic attack) 2006  ? ? ?Past Surgical History:  ?Procedure Laterality Date  ? ABDOMINAL HYSTERECTOMY    ? BIOPSY THYROID  Jan. 2016  ? LAPAROSCOPY N/A 08/25/2015  ? Procedure: EXPLORATORY LAPAROTOMY, ILEOCECECTOMY, PRIMARY ANASTOMOSIS, LYSIS OF ADHESIONS;  Surgeon: Autumn Messing III, MD;  Location: [facility] ORS;   Service: General;  Laterality: N/A;  ? laparoscopy for fertility work up    ? LOOP RECORDER IMPLANT N/A 03/24/2014  ? Procedure: LOOP RECORDER IMPLANT;  Surgeon: Deboraha Sprang, MD;  Location: [facility] C[facility] LAB;  Service: Cardiovascular;  Laterality: N/A;  ? LUMBAR LAMINECTOMY/DECOMPRESSION MICRODISCECTOMY  04/25/2011  ? Procedure: LUMBAR LAMINECTOMY/DECOMPRESSION MICRODISCECTOMY;  Surgeon: [name], MD;  Location: [facility] NEURO ORS;  Service: Neurosurgery;  Laterality: Right;  Right Lumbar Four-Five Discectomy  ? OOPHORECTOMY    ? TONSILLECTOMY    ? ? ?Current Medications: ?Current Meds  ?Medication Sig  ? amLODipine (NORVASC) 5 MG tablet Take 1 tablet (5 mg total) by mouth daily.  ? apixaban (ELIQUIS) 5 MG TABS tablet Take 1 tablet (5 mg total) by mouth 2 (two) times daily.  ? azelastine (ASTELIN) 0.1 % nasal spray Place 2 sprays into both nostrils as needed for rhinitis or allergies.  ? Coenzyme Q10 (COQ10) 100 MG CAPS Take 1 tablet by mouth daily.  ? furosemide (LASIX) 20 MG tablet Take 1 tablet (20 mg total) by mouth daily.  ? isosorbide mononitrate (IMDUR) 60 MG 24 hr tablet Take 1 tablet (60 mg total) by mouth daily.  ? losartan (COZAAR) 50 MG tablet Take 1 tablet by mouth once daily (Patient taking differently: Take 50 mg by mouth daily.)  ? Multiple Vitamins-Minerals (ZINC PO) Take 30 mg by mouth daily.  ? nitroGLYCERIN (NITROSTAT) 0.4 MG SL tablet Place 1  tablet (0.4 mg total) under the tongue every 5 (five) minutes as needed for chest pain.  ? omeprazole (PRILOSEC) 10 MG capsule Take 10 mg by mouth daily.  ? propranolol (INDERAL) 10 MG tablet Take 1 tablet (10 mg total) by mouth every 4 (four) hours as needed. (Patient taking differently: Take 10 mg by mouth every 4 (four) hours as needed (Rapid and irregular HR).)  ? rosuvastatin (CRESTOR) 5 MG tablet Take 1 tablet (5 mg total) by mouth daily.  ? TURMERIC PO Take 1 tablet by mouth daily.  ? VITAMIN D PO Take 25 mcg by mouth daily.  ? Zinc 30 MG CAPS Take 1  capsule by mouth daily.  ?  ? ?Allergies:   Codeine and Morphine and related  ? ?Social History  ? ?Socioeconomic History  ? Marital status: Married  ?  Spouse name: Not on file  ? Number of children: Not on file  ? Years of education: Not on file  ? Highest education level: Not on file  ?Occupational History  ? Occupation: retired  ?Tobacco Use  ? Smoking status: Former  ?  Types: Cigarettes  ?  Quit date: 04/02/2004  ?  Years since quitting: 17.3  ? Smokeless tobacco: Never  ?Vaping Use  ? Vaping Use: Never used  ?Substance and Sexual Activity  ? Alcohol use: Yes  ?  Alcohol/week: 2.0 standard drinks  ?  Types: 2 Glasses of wine per week  ? Drug use: No  ? Sexual activity: Not on file  ?Other Topics Concern  ? Not on file  ?Social History Narrative  ? Not on file  ? ?Social Determinants of Health  ? ?Financial Resource Strain: Not on file  ?Food Insecurity: Not on file  ?Transportation Needs: Not on file  ?Physical Activity: Not on file  ?Stress: Not on file  ?Social Connections: Not on file  ?  ? ?Family History: ?The patient's family history includes Cancer in her brother and sister; Colon cancer in her paternal uncle; Diabetes in her sister. ?ROS:   ?Please see the history of present illness.    ?All 14 point review of systems negative except as described per history of present illness ? ?EKGs/Labs/Other Studies Reviewed:   ? ? ? ?Recent Labs: ?07/06/2021: BUN 21; Creatinine, Ser 1.03; NT-Pro BNP 338; Potassium 4.5; Sodium 148  ?Recent Lipid Panel ?   ?Component Value Date/Time  ? CHOL 289 (H) 12/08/2018 0913  ? TRIG 117 12/08/2018 0913  ? HDL 56 12/08/2018 0913  ? CHOLHDL 5.2 12/08/2018 0913  ? VLDL 23 12/08/2018 0913  ? [place] 210 (H) 12/08/2018 0913  ? LDLDIRECT 167.0 07/19/2015 1108  ? ? ?Physical Exam:   ? ?VS:  BP (!) 148/82 (BP Location: Left Arm, Patient Position: Sitting)   Pulse (!) 47   Ht '5\' 6"'$  (1.676 m)   Wt 166 lb (75.3 kg)   SpO2 98%   BMI 26.79 kg/m?    ? ?Wt Readings from Last 3 Encounters:   ?08/09/21 166 lb (75.3 kg)  ?07/06/21 166 lb 9.6 oz (75.6 kg)  ?06/05/21 160 lb 4.8 oz (72.7 kg)  ?  ? ?GEN:  Well nourished, well developed in no acute distress ?HEENT: Normal ?NECK: No JVD; No carotid bruits ?LYMPHATICS: No lymphadenopathy ?CARDIAC: RRR, no murmurs, no rubs, no gallops ?RESPIRATORY:  Clear to auscultation without rales, wheezing or rhonchi  ?ABDOMEN: Soft, non-tender, non-distended ?MUSCULOSKELETAL:  No edema; No deformity  ?SKIN: Warm and dry ?LOWER EXTREMITIES: no swelling ?NEUROLOGIC:  Alert and oriented x 3 ?PSYCHIATRIC:  Normal affect  ? ?ASSESSMENT:   ? ?1. Coronary artery disease involving native coronary artery of native heart without angina pectoris   ?2. Paroxysmal A-fib ([place])   ?3. Controlled type 2 diabetes mellitus with complication, without long-term current use of insulin ([place])   ?4. Hyperlipidemia, unspecified hyperlipidemia type   ?5. Preop cardiovascular exam   ? ?PLAN:   ? ?In order of problems listed above: ? ?Coronary artery disease stable from that point review of breath at the same time her ability to exercise very limited, therefore, I elected to do stress test make sure she does not have any inducible ischemia ?Paroxysmal atrial fibrillation, she is on Eliquis which I will continue.  Stable rhythm continue present management ?Type 2 diabetes that being followed by internal medicine team, I did review K PN which show me her hemoglobin A1c of 5.8 however the data is from 2020 ?Dyslipidemia she is on Crestor 5 I did review her blood test done by primary care physician last time 5 months ago with LDL of 113.  We will try to increase dose of rosuvastatin. ?Cardiovascular preop evaluation because of lack of ability to exercise because of hip pain we will do a Lexiscan to rule out ischemia ? ? ?Medication Adjustments/Labs and Tests Ordered: ?Current medicines are reviewed at length with the patient today.  Concerns regarding medicines are outlined above.  ?Orders Placed This  Encounter  ?Procedures  ? Basic Metabolic Panel (BMET)  ? MYOCARDIAL PERFUSION IMAGING  ? ?Medication changes: No orders of the defined types were placed in this encounter. ? ? ?Signed, ?Park Liter, MD, FA

## 2021-08-10 LAB — BASIC METABOLIC PANEL
BUN/Creatinine Ratio: 19 (ref 12–28)
BUN: 28 mg/dL — ABNORMAL HIGH (ref 8–27)
CO2: 29 mmol/L (ref 20–29)
Calcium: 9.5 mg/dL (ref 8.7–10.3)
Chloride: 105 mmol/L (ref 96–106)
Creatinine, Ser: 1.45 mg/dL — ABNORMAL HIGH (ref 0.57–1.00)
Glucose: 100 mg/dL — ABNORMAL HIGH (ref 70–99)
Potassium: 4.2 mmol/L (ref 3.5–5.2)
Sodium: 146 mmol/L — ABNORMAL HIGH (ref 134–144)
eGFR: 38 mL/min/{1.73_m2} — ABNORMAL LOW (ref >59)

## 2021-08-15 ENCOUNTER — Ambulatory Visit (INDEPENDENT_AMBULATORY_CARE_PROVIDER_SITE_OTHER): Payer: Medicare Other

## 2021-08-15 ENCOUNTER — Telehealth: Payer: Self-pay

## 2021-08-15 DIAGNOSIS — I480 Paroxysmal atrial fibrillation: Secondary | ICD-10-CM

## 2021-08-15 DIAGNOSIS — E118 Type 2 diabetes mellitus with unspecified complications: Secondary | ICD-10-CM | POA: Diagnosis not present

## 2021-08-15 DIAGNOSIS — E785 Hyperlipidemia, unspecified: Secondary | ICD-10-CM | POA: Diagnosis not present

## 2021-08-15 DIAGNOSIS — I2510 Atherosclerotic heart disease of native coronary artery without angina pectoris: Secondary | ICD-10-CM | POA: Diagnosis not present

## 2021-08-15 DIAGNOSIS — I10 Essential (primary) hypertension: Secondary | ICD-10-CM

## 2021-08-15 LAB — MYOCARDIAL PERFUSION IMAGING
LV dias vol: 57 mL (ref 46–106)
LV sys vol: 13 mL
Nuc Stress EF: 78 %
Peak HR: 85 {beats}/min
Rest HR: 52 {beats}/min
Rest Nuclear Isotope Dose: 10.8 mCi
SDS: 0
SRS: 7
SSS: 7
ST Depression (mm): 0 mm
Stress Nuclear Isotope Dose: 30.9 mCi
TID: 0.8

## 2021-08-15 MED ORDER — REGADENOSON 0.4 MG/5ML IV SOLN
0.4000 mg | Freq: Once | INTRAVENOUS | Status: AC
  Administered 2021-08-15: 0.4 mg via INTRAVENOUS

## 2021-08-15 MED ORDER — TECHNETIUM TC 99M TETROFOSMIN IV KIT
30.9000 | PACK | Freq: Once | INTRAVENOUS | Status: AC | PRN
  Administered 2021-08-15: 30.9 via INTRAVENOUS

## 2021-08-15 MED ORDER — TECHNETIUM TC 99M TETROFOSMIN IV KIT
10.8000 | PACK | Freq: Once | INTRAVENOUS | Status: AC | PRN
  Administered 2021-08-15: 10.8 via INTRAVENOUS

## 2021-08-15 NOTE — Telephone Encounter (Signed)
-----   Message from Park Liter, MD sent at 08/11/2021 12:35 PM EDT ----- ?Labs acceptable, kidney function mildly deteriorated, please repeat Chem-7 in about a week ?

## 2021-08-15 NOTE — Telephone Encounter (Signed)
Patient notified of results. She will stop by next week for blood work ?

## 2021-08-21 ENCOUNTER — Telehealth: Payer: Self-pay

## 2021-08-21 NOTE — Telephone Encounter (Signed)
Patient notified of results.

## 2021-08-21 NOTE — Telephone Encounter (Signed)
-----   Message from Park Liter, MD sent at 08/17/2021  2:50 PM EDT ----- Stress test looks good, no ischemia

## 2021-08-23 NOTE — Progress Notes (Signed)
COVID Vaccine Completed: Yes  Date of COVID positive in last 90 days:  PCP - Gilford Rile, MD Cardiologist - Fransico Him, MD Electrophysiologist - Virl Axe, MD  Chest x-ray -  EKG - 07-06-21 Epic Stress Test - 08-15-21 Epic ECHO - 07-19-21 Epic Cardiac Cath -  Pacemaker/ICD device last checked: Spinal Cord Stimulator: Long Term Monitor - 2021 Epic Coronary CT - 12-09-18 Epic  Bowel Prep -   Sleep Study -  CPAP -   Fasting Blood Sugar -  Checks Blood Sugar _____ times a day  Blood Thinner Instructions:  Eliquis Aspirin Instructions: Last Dose:  Activity level:  Can go up a flight of stairs and perform activities of daily living without stopping and without symptoms of chest pain or shortness of breath.   Able to exercise without symptoms  Unable to go up a flight of stairs without symptoms of      Anesthesia review:  Afib, CAD, venous insufficiency, CKD, hx of CVA  Patient denies shortness of breath, fever, cough and chest pain at PAT appointment  Patient verbalized understanding of instructions that were given to them at the PAT appointment. Patient was also instructed that they will need to review over the PAT instructions again at home before surgery.

## 2021-08-23 NOTE — Patient Instructions (Signed)
DUE TO COVID-19 ONLY TWO VISITORS  (aged 75 and older)  IS ALLOWED TO COME WITH YOU AND STAY IN THE WAITING ROOM ONLY DURING PRE OP AND PROCEDURE.   **NO VISITORS ARE ALLOWED IN THE SHORT STAY AREA OR RECOVERY ROOM!!**  IF YOU WILL BE ADMITTED INTO THE HOSPITAL YOU ARE ALLOWED ONLY FOUR SUPPORT PEOPLE DURING VISITATION HOURS ONLY (7 AM -8PM)   The support person(s) must pass our screening, gel in and out Visitors GUEST BADGE MUST BE WORN VISIBLY  One adult visitor may remain with you overnight and MUST be in the room by 8 P.M.   You are not required to [employer] often Do NOT share personal items Notify your provider if you are in close contact with someone who has COVID or you develop fever 100.4 or greater, new onset of sneezing, cough, sore throat, shortness of breath or body aches.        Your procedure is scheduled on:  09-06-21   Report to [facility] Main Entrance    Report to admitting at 9:30 AM   Call this number if you have problems the morning of surgery [phone]   Do not eat food :After Midnight.   After Midnight you may have the following liquids until 9:00 AM DAY OF SURGERY  Water Black Coffee (sugar ok, NO MILK/CREAM OR CREAMERS)  Tea (sugar ok, NO MILK/CREAM OR CREAMERS) regular and decaf                             Plain Jell-O (NO RED)                                           Fruit ices (not with fruit pulp, NO RED)                                     Popsicles (NO RED)                                                                  Juice: apple, WHITE grape, WHITE cranberry Sports drinks like Gatorade (NO RED) Clear broth(vegetable,chicken,beef)                   The day of surgery:  Drink ONE (1) Pre-Surgery G2 at 9:00 AM the morning of surgery. Drink in one sitting. Do not sip.  This drink was given to you during your hospital  pre-op appointment visit. Nothing else to drink after completing the Pre-Surgery G2.          If  you have questions, please contact your surgeon's office.   FOLLOW ANY ADDITIONAL PRE OP INSTRUCTIONS YOU RECEIVED FROM YOUR SURGEON'S OFFICE!!!     Oral Hygiene is also important to reduce your risk of infection.                                    Remember - BRUSH YOUR TEETH THE  MORNING OF SURGERY WITH YOUR REGULAR TOOTHPASTE   Do NOT smoke after Midnight   Take these medicines the morning of surgery with A SIP OF WATER: Amlodipine, Propranolol, Rosuvastatin.  Okay to use nasal spray   Eliquis -   DO NOT TAKE ANY ORAL DIABETIC MEDICATIONS DAY OF YOUR SURGERY  Bring CPAP mask and tubing day of surgery.                              You may not have any metal on your body including hair pins, jewelry, and body piercing             Do not wear make-up, lotions, powders, perfumes or deodorant  Do not wear nail polish including gel and S&S, artificial/acrylic nails, or any other type of covering on natural nails including finger and toenails. If you have artificial nails, gel coating, etc. that needs to be removed by a nail salon please have this removed prior to surgery or surgery may need to be canceled/ delayed if the surgeon/ anesthesia feels like they are unable to be safely monitored.   Do not shave  48 hours prior to surgery.    Do not bring valuables to the hospital. [employer].   Contacts, dentures or bridgework may not be worn into surgery.   Bring small overnight bag day of surgery.   Special Instructions: Bring a copy of your healthcare power of attorney and living will documents the day of surgery if you haven't scanned them before.  Please read over the following fact sheets you were given: IF YOU HAVE QUESTIONS ABOUT YOUR PRE-OP INSTRUCTIONS PLEASE CALL [facility] - Preparing for Surgery Before surgery, you can play an important role.  Because skin is not sterile, your skin needs to be as free of germs as possible.   You can reduce the number of germs on your skin by washing with CHG (chlorahexidine gluconate) soap before surgery.  CHG is an antiseptic cleaner which kills germs and bonds with the skin to continue killing germs even after washing. Please DO NOT use if you have an allergy to CHG or antibacterial soaps.  If your skin becomes reddened/irritated stop using the CHG and inform your nurse when you arrive at Short Stay. Do not shave (including legs and underarms) for at least 48 hours prior to the first CHG shower.  You may shave your face/neck.  Please follow these instructions carefully:  1.  Shower with CHG Soap the night before surgery and the  morning of surgery.  2.  If you choose to wash your hair, wash your hair first as usual with your normal  shampoo.  3.  After you shampoo, rinse your hair and body thoroughly to remove the shampoo.                             4.  Use CHG as you would any other liquid soap.  You can apply chg directly to the skin and wash.  Gently with a scrungie or clean washcloth.  5.  Apply the CHG Soap to your body ONLY FROM THE NECK DOWN.   Do   not use on face/ open  Wound or open sores. Avoid contact with eyes, ears mouth and   genitals (private parts).                       Wash face,  Genitals (private parts) with your normal soap.             6.  Wash thoroughly, paying special attention to the area where your    surgery  will be performed.  7.  Thoroughly rinse your body with warm water from the neck down.  8.  DO NOT shower/wash with your normal soap after using and rinsing off the CHG Soap.                9.  Pat yourself dry with a clean towel.            10.  Wear clean pajamas.            11.  Place clean sheets on your bed the night of your first shower and do not  sleep with pets. Day of Surgery : Do not apply any lotions/deodorants the morning of surgery.  Please wear clean clothes to the hospital/surgery center.  FAILURE TO  FOLLOW THESE INSTRUCTIONS MAY RESULT IN THE CANCELLATION OF YOUR SURGERY  PATIENT SIGNATURE_________________________________  NURSE SIGNATURE__________________________________  ________________________________________________________________________     Tiffany Wiggins  An incentive spirometer is a tool that can help keep your lungs clear and active. This tool measures how well you are filling your lungs with each breath. Taking long deep breaths may help reverse or decrease the chance of developing breathing (pulmonary) problems (especially infection) following: A long period of time when you are unable to move or be active. BEFORE THE PROCEDURE  If the spirometer includes an indicator to show your best effort, your nurse or respiratory therapist will set it to a desired goal. If possible, sit up straight or lean slightly forward. Try not to slouch. Hold the incentive spirometer in an upright position. INSTRUCTIONS FOR USE  Sit on the edge of your bed if possible, or sit up as far as you can in bed or on a chair. Hold the incentive spirometer in an upright position. Breathe out normally. Place the mouthpiece in your mouth and seal your lips tightly around it. Breathe in slowly and as deeply as possible, raising the piston or the ball toward the top of the column. Hold your breath for 3-5 seconds or for as long as possible. Allow the piston or ball to fall to the bottom of the column. Remove the mouthpiece from your mouth and breathe out normally. Rest for a few seconds and repeat Steps 1 through 7 at least 10 times every 1-2 hours when you are awake. Take your time and take a few normal breaths between deep breaths. The spirometer may include an indicator to show your best effort. Use the indicator as a goal to work toward during each repetition. After each set of 10 deep breaths, practice coughing to be sure your lungs are clear. If you have an incision (the cut made at the time of  surgery), support your incision when coughing by placing a pillow or rolled up towels firmly against it. Once you are able to get out of bed, walk around indoors and cough well. You may stop using the incentive spirometer when instructed by your caregiver.  RISKS AND COMPLICATIONS Take your time so you do not get dizzy or light-headed. If you are in  pain, you may need to take or ask for pain medication before doing incentive spirometry. It is harder to take a deep breath if you are having pain. AFTER USE Rest and breathe slowly and easily. It can be helpful to keep track of a log of your progress. Your caregiver can provide you with a simple table to help with this. If you are using the spirometer at home, follow these instructions: [place] IF:  You are having difficultly using the spirometer. You have trouble using the spirometer as often as instructed. Your pain medication is not giving enough relief while using the spirometer. You develop fever of 100.5 F (38.1 C) or higher. SEEK IMMEDIATE MEDICAL CARE IF:  You cough up bloody sputum that had not been present before. You develop fever of 102 F (38.9 C) or greater. You develop worsening pain at or near the incision site. MAKE SURE YOU:  Understand these instructions. Will watch your condition. Will get help right away if you are not doing well or get worse. Document Released: 07/30/2006 Document Revised: 06/11/2011 Document Reviewed: 09/30/2006 ExitCare Patient Information 2014 ExitCare, [place].   ________________________________________________________________________  WHAT IS A BLOOD TRANSFUSION? Blood Transfusion Information  A transfusion is the replacement of blood or some of its parts. Blood is made up of multiple cells which provide different functions. Red blood cells carry oxygen and are used for blood loss replacement. White blood cells fight against infection. Platelets control bleeding. Plasma helps clot  blood. Other blood products are available for specialized needs, such as hemophilia or other clotting disorders. BEFORE THE TRANSFUSION  Who gives blood for transfusions?  Healthy volunteers who are fully evaluated to make sure their blood is safe. This is blood bank blood. Transfusion therapy is the safest it has ever been in the practice of medicine. Before blood is taken from a donor, a complete history is taken to make sure that person has no history of diseases nor engages in risky social behavior (examples are intravenous drug use or sexual activity with multiple partners). The donor's travel history is screened to minimize risk of transmitting infections, such as malaria. The donated blood is tested for signs of infectious diseases, such as HIV and hepatitis. The blood is then tested to be sure it is compatible with you in order to minimize the chance of a transfusion reaction. If you or a relative donates blood, this is often done in anticipation of surgery and is not appropriate for emergency situations. It takes many days to process the donated blood. RISKS AND COMPLICATIONS Although transfusion therapy is very safe and saves many lives, the main dangers of transfusion include:  Getting an infectious disease. Developing a transfusion reaction. This is an allergic reaction to something in the blood you were given. Every precaution is taken to prevent this. The decision to have a blood transfusion has been considered carefully by your caregiver before blood is given. Blood is not given unless the benefits outweigh the risks. AFTER THE TRANSFUSION Right after receiving a blood transfusion, you will usually feel much better and more energetic. This is especially true if your red blood cells have gotten low (anemic). The transfusion raises the level of the red blood cells which carry oxygen, and this usually causes an energy increase. The nurse administering the transfusion will monitor you  carefully for complications. HOME CARE INSTRUCTIONS  No special instructions are needed after a transfusion. You may find your energy is better. Speak with your caregiver about any  limitations on activity for underlying diseases you may have. SEEK MEDICAL CARE IF:  Your condition is not improving after your transfusion. You develop redness or irritation at the intravenous (IV) site. SEEK IMMEDIATE MEDICAL CARE IF:  Any of the following symptoms occur over the next 12 hours: Shaking chills. You have a temperature by mouth above 102 F (38.9 C), not controlled by medicine. Chest, back, or muscle pain. People around you feel you are not acting correctly or are confused. Shortness of breath or difficulty breathing. Dizziness and fainting. You get a rash or develop hives. You have a decrease in urine output. Your urine turns a dark color or changes to pink, red, or brown. Any of the following symptoms occur over the next 10 days: You have a temperature by mouth above 102 F (38.9 C), not controlled by medicine. Shortness of breath. Weakness after normal activity. The white part of the eye turns yellow (jaundice). You have a decrease in the amount of urine or are urinating less often. Your urine turns a dark color or changes to pink, red, or brown. Document Released: 03/16/2000 Document Revised: 06/11/2011 Document Reviewed: 11/03/2007 [facility] Patient Information 2014 [place], [place].  _______________________________________________________________________

## 2021-08-23 NOTE — H&P (Signed)
TOTAL HIP ADMISSION H&P  Patient is admitted for left total hip arthroplasty.  Subjective:  Chief Complaint: Left hip pain  HPI: Tiffany Wiggins, 76 y.o. female, has a history of pain and functional disability in the left hip due to arthritis and patient has failed non-surgical conservative treatments for greater than 12 weeks to include NSAID's and/or analgesics, corticosteriod injections, and activity modification. Onset of symptoms was gradual, starting  several  years ago with gradually worsening course since that time. The patient noted no past surgery on the left hip. Patient currently rates pain in the left hip at 9 out of 10 with activity. Patient has night pain, worsening of pain with activity and weight bearing, and pain with passive range of motion. Patient has evidence of  bone-on-bone arthritis centrally and inferiorly  by imaging studies. This condition presents safety issues increasing the risk of falls. There is no current active infection.  Patient Active Problem List   Diagnosis Date Noted   Bradycardia 05/23/2021   History of loop recorder 12/31/2019   Chest pain of uncertain etiology 56/81/2751   CAD (coronary artery disease) 12/10/2018   Paroxysmal A-fib (HCC)    Senile purpura ([place]) 12/17/2017   Chronic pain of both shoulders 10/01/2017   Moderate late onset Alzheimer's dementia ([place]) 09/16/2017   Stage 3a chronic kidney disease ([place]) 05/29/2017   Vitamin B12 deficiency 03/20/2017   GERD without esophagitis 01/24/2017   Degenerative lumbar disc 01/24/2017   Bilateral carotid bruits 01/24/2017   Atherosclerosis of both carotid arteries 01/24/2017   History of CVA (cerebrovascular accident) 01/24/2017   High risk medication use 01/24/2017   Osteoarthritis of multiple joints 01/24/2017   Vitamin D deficiency 01/24/2017   Malaise and fatigue 01/24/2017   Pain of left hip joint 01/14/2017   Unilateral primary osteoarthritis, left hip 01/14/2017   Cough 02/15/2016    Wheezing 02/15/2016   Perforation of cecum s/p R hemicolectomy with anastamosis (08/25/2015) 08/26/2015   Preop cardiovascular exam 07/27/2015   Thoracic back pain 04/25/2015   Chronic venous insufficiency 12/21/2014   Hip arthritis 08/10/2014   Breast pain, right 07/22/2014   Thyroid nodule 05/01/2014   PALPITATIONS, CHRONIC 12/08/2009   HIP PAIN, BILATERAL 09/15/2009   OVERWEIGHT 03/08/2008   Cerebral artery occlusion with cerebral infarction ([place]) 03/08/2008   Hyperlipemia 01/30/2007   Essential hypertension 01/30/2007   Diabetes mellitus type 2, controlled, with complications ([place]) 70/04/7492    Past Medical History:  Diagnosis Date   CVA (cerebral vascular accident) ([place]) 2019   Diabetes mellitus type 2 in nonobese ([place])    Hyperlipidemia    Hypertension    PCP GAVE FOR HER  BLOOD PRESSURE   IBS (irritable bowel syndrome)    PUD (peptic ulcer disease)    TIA (transient ischemic attack) 2006    Past Surgical History:  Procedure Laterality Date   ABDOMINAL HYSTERECTOMY     BIOPSY THYROID  Jan. 2016   LAPAROSCOPY N/A 08/25/2015   Procedure: EXPLORATORY LAPAROTOMY, ILEOCECECTOMY, PRIMARY ANASTOMOSIS, LYSIS OF ADHESIONS;  Surgeon: Autumn Messing III, MD;  Location: [facility] ORS;  Service: General;  Laterality: N/A;   laparoscopy for fertility work up     [place] N/A 03/24/2014   Procedure: LOOP RECORDER IMPLANT;  Surgeon: Deboraha Sprang, MD;  Location: [facility] CATH LAB;  Service: Cardiovascular;  Laterality: N/A;   LUMBAR LAMINECTOMY/DECOMPRESSION MICRODISCECTOMY  04/25/2011   Procedure: LUMBAR LAMINECTOMY/DECOMPRESSION MICRODISCECTOMY;  Surgeon: [name], MD;  Location: [place] [facility]URO ORS;  Service: Neurosurgery;  Laterality: Right;  Right Lumbar Four-Five Discectomy   OOPHORECTOMY     TONSILLECTOMY      Prior to Admission medications   Medication Sig Start Date End Date Taking? Authorizing Provider  amLODipine (NORVASC) 5 MG tablet Take 1 tablet (5 mg total) by mouth  daily. 07/06/21 10/04/21 Yes Park Liter, MD  apixaban (ELIQUIS) 5 MG TABS tablet Take 1 tablet (5 mg total) by mouth 2 (two) times daily. 12/10/18  Yes Shelly Coss, MD  azelastine (ASTELIN) 0.1 % nasal spray Place 2 sprays into both nostrils as needed for rhinitis or allergies. 05/10/20  Yes [provider]  Cholecalciferol (VITAMIN D) 125 MCG (5000 UT) CAPS Take 5,000 Units by mouth daily.   Yes [provider]  Coenzyme Q10 (COQ10) 100 MG CAPS Take 100 mg by mouth daily.   Yes [provider]  Ferrous Sulfate 90 (18 Fe) MG TABS Take 18 mg by mouth daily.   Yes [provider]  furosemide (LASIX) 20 MG tablet Take 1 tablet (20 mg total) by mouth daily. 07/06/21 10/04/21 Yes Park Liter, MD  isosorbide mononitrate (IMDUR) 60 MG 24 hr tablet Take 1 tablet (60 mg total) by mouth daily. 03/08/21  Yes Shirley Friar, PA-C  losartan (COZAAR) 50 MG tablet Take 1 tablet by mouth once daily 06/19/21  Yes Deboraha Sprang, MD  nitroGLYCERIN (NITROSTAT) 0.4 MG SL tablet Place 1 tablet (0.4 mg total) under the tongue every 5 (five) minutes as needed for chest pain. 07/06/21  Yes Park Liter, MD  OVER THE COUNTER MEDICATION Take 2 tablets by mouth daily. Memory and Nerve Support   Yes [provider]  propranolol (INDERAL) 10 MG tablet Take 1 tablet (10 mg total) by mouth every 4 (four) hours as needed. Patient taking differently: Take 10 mg by mouth every 4 (four) hours as needed (Rapid and irregular HR). 12/22/20  Yes Deboraha Sprang, MD  rosuvastatin (CRESTOR) 5 MG tablet Take 1 tablet (5 mg total) by mouth daily. 04/07/21  Yes Deboraha Sprang, MD  Zinc 50 MG TABS Take 50 mg by mouth daily.   Yes [provider]    Allergies  Allergen Reactions   Codeine Nausea Only   Morphine And Related Itching    Social History   Socioeconomic History   Marital status: Married    Spouse name: Not on file   Number of children: Not on file    Years of education: Not on file   Highest education level: Not on file  Occupational History   Occupation: retired  Tobacco Use   Smoking status: Former    Types: Cigarettes    Quit date: 04/02/2004    Years since quitting: 17.4   Smokeless tobacco: Never  Vaping Use   Vaping Use: Never used  Substance and Sexual Activity   Alcohol use: Yes    Alcohol/week: 2.0 standard drinks    Types: 2 Glasses of wine per week   Drug use: No   Sexual activity: Not on file  Other Topics Concern   Not on file  Social History Narrative   Not on file   Social Determinants of Health   Financial Resource Strain: Not on file  Food Insecurity: Not on file  Transportation Needs: Not on file  Physical Activity: Not on file  Stress: Not on file  Social Connections: Not on file  Intimate Partner Violence: Not on file    Tobacco Use: Medium Risk  Smoking Tobacco Use: Former   Smokeless Tobacco Use: Never   Passive Exposure: Not on file   Social History   Substance and Sexual Activity  Alcohol Use Yes   Alcohol/week: 2.0 standard drinks   Types: 2 Glasses of wine per week    Family History  Problem Relation Age of Onset   Cancer Sister        lung   Diabetes Sister    Cancer Brother        lung   Colon cancer Paternal Uncle     Review of Systems  Constitutional:  Negative for chills and fever.  HENT: Negative.    Eyes: Negative.   Respiratory:  Negative for cough and shortness of breath.   Cardiovascular:  Negative for chest pain and palpitations.  Gastrointestinal:  Negative for abdominal pain, nausea and vomiting.  Genitourinary:  Negative for dysuria, frequency and urgency.  Musculoskeletal:  Positive for joint pain.  Skin:  Negative for rash.    Objective:  Physical Exam: Well nourished and well developed.  General: Alert and oriented x3, cooperative and pleasant, no acute distress.  Head: normocephalic, atraumatic, neck supple.  Eyes: EOMI.  Abdomen: non-tender to  palpation and soft, normoactive bowel sounds. Musculoskeletal: The patient has a significantly antalgic gait pattern favoring the left side without the use of assistive devices.  Right Hip Exam:  Range of motion: Flexion to 110 degrees, internal rotation to 20 degrees, external rotation to 20 degrees, and abduction to 30 degrees without discomfort.  There is no tenderness over the greater trochanteric bursa.  Left Hip Exam:  Range of motion: Flexion to 100 degrees, internal rotation to 0 degrees, external rotation to 10 to 20 degrees, and abduction to 20 degrees without discomfort.  There is no tenderness over the greater trochanteric bursa.  Calves soft and nontender. Motor function intact in LE. Strength 5/5 LE bilaterally. Neuro: Distal pulses 2+. Sensation to light touch intact in LE.  Vital signs in last 24 hours: BP: ()/()  Arterial Line BP: ()/()   Imaging Review Plain radiographs demonstrate severe degenerative joint disease of the left hip. The bone quality appears to be adequate for age and reported activity level.  Assessment/Plan:  End stage arthritis, left hip  The patient history, physical examination, clinical judgement of the provider and imaging studies are consistent with end stage degenerative joint disease of the left hip and total hip arthroplasty is deemed medically necessary. The treatment options including medical management, injection therapy, arthroscopy and arthroplasty were discussed at length. The risks and benefits of total hip arthroplasty were presented and reviewed. The risks due to aseptic loosening, infection, stiffness, dislocation/subluxation, thromboembolic complications and other imponderables were discussed. The patient acknowledged the explanation, agreed to proceed with the plan and consent was signed. Patient is being admitted for inpatient treatment for surgery, pain control, PT, OT, prophylactic antibiotics, VTE prophylaxis, progressive  ambulation and ADLs and discharge planning.The patient is planning to be discharged  home .  Therapy Plans: HEP Disposition: Home with Husband Planned DVT Prophylaxis: Eliquis DME Needed: None PCP: Gilford Rile, MD (see below) Cardiologist: Jenne Campus, MD (clearance after lexiscan) TXA: IV Allergies: codeine (N/V), morphine (N/V) Anesthesia Concerns: None BMI: 28.8 Last HgbA1c: n/a  Pharmacy: [facility] ([place], [place])  Other: -Lexiscan stress test on 08/15/2021 - normal with no ischemia -Per PCP: This patient is a high risk candidate because of her known history of stroke, paroxysmal atrial fibrillation, substantial carotid arthrosclerosis, bradycardia on no rate lowering medication  with possible junctional rhythm, known coronary disease with myocardial infarction within the last 3 years, and dementia. -Has tolerated hydrocodone in the past  - Patient was instructed on what medications to stop prior to surgery. - Follow-up visit in 2 weeks with Dr. Wynelle Link - Begin physical therapy following surgery - Pre-operative lab work as pre-surgical testing - Prescriptions will be provided in hospital at time of discharge  R. Jaynie Bream, PA-C Orthopedic Surgery [facility] [place]

## 2021-08-24 ENCOUNTER — Other Ambulatory Visit: Payer: Self-pay

## 2021-08-24 ENCOUNTER — Encounter (HOSPITAL_COMMUNITY): Payer: Self-pay

## 2021-08-24 ENCOUNTER — Encounter (HOSPITAL_COMMUNITY)
Admission: RE | Admit: 2021-08-24 | Discharge: 2021-08-24 | Disposition: A | Discharge status: Home / Self Care | Payer: Medicare Other | Source: Ambulatory Visit | Attending: Orthopedic Surgery | Admitting: Orthopedic Surgery

## 2021-08-24 VITALS — BP 138/64 | HR 54 | Temp 98.3°F | Resp 16 | Ht 66.0 in | Wt 164.0 lb

## 2021-08-24 DIAGNOSIS — Z01818 Encounter for other preprocedural examination: Secondary | ICD-10-CM

## 2021-08-24 DIAGNOSIS — I2510 Atherosclerotic heart disease of native coronary artery without angina pectoris: Secondary | ICD-10-CM | POA: Diagnosis not present

## 2021-08-24 DIAGNOSIS — Z01812 Encounter for preprocedural laboratory examination: Secondary | ICD-10-CM | POA: Diagnosis present

## 2021-08-24 DIAGNOSIS — E119 Type 2 diabetes mellitus without complications: Secondary | ICD-10-CM | POA: Diagnosis not present

## 2021-08-24 HISTORY — DX: Other amnesia: R41.3

## 2021-08-24 HISTORY — DX: Unspecified osteoarthritis, unspecified site: M19.90

## 2021-08-24 HISTORY — DX: Anxiety disorder, unspecified: F41.9

## 2021-08-24 LAB — TYPE AND SCREEN
ABO/RH(D): A POS
Antibody Screen: NEGATIVE

## 2021-08-24 LAB — CBC
HCT: 34.6 % — ABNORMAL LOW (ref 36.0–46.0)
Hemoglobin: 10.9 g/dL — ABNORMAL LOW (ref 12.0–15.0)
MCH: 32.6 pg (ref 26.0–34.0)
MCHC: 31.5 g/dL (ref 30.0–36.0)
MCV: 103.6 fL — ABNORMAL HIGH (ref 80.0–100.0)
Platelets: 188 10*3/uL (ref 150–400)
RBC: 3.34 MIL/uL — ABNORMAL LOW (ref 3.87–5.11)
RDW: 12.5 % (ref 11.5–15.5)
WBC: 9.5 10*3/uL (ref 4.0–10.5)
nRBC: 0 % (ref 0.0–0.2)

## 2021-08-24 LAB — SURGICAL PCR SCREEN
MRSA, PCR: NEGATIVE
Staphylococcus aureus: NEGATIVE

## 2021-08-24 LAB — HEMOGLOBIN A1C
Hgb A1c MFr Bld: 5.8 % — ABNORMAL HIGH (ref 4.8–5.6)
Mean Plasma Glucose: 119.76 mg/dL

## 2021-08-24 LAB — GLUCOSE, CAPILLARY: Glucose-Capillary: 153 mg/dL — ABNORMAL HIGH (ref 70–99)

## 2021-08-30 NOTE — Anesthesia Preprocedure Evaluation (Addendum)
Anesthesia Evaluation  Patient identified by MRN, date of birth, ID band Patient awake    Reviewed: Allergy & Precautions, NPO status , Patient's Chart, lab work & pertinent test results  Airway Mallampati: III  TM Distance: <3 FB Neck ROM: Full    Dental no notable dental hx. (+) Teeth Intact, Dental Advisory Given   Pulmonary former smoker,    Pulmonary exam normal breath sounds clear to auscultation       Cardiovascular hypertension, + CAD  Normal cardiovascular exam Rhythm:Regular Rate:Normal  08/15/2021 Myoview  Findings are consistent with no  ischemia and no prior myocardial infarction. The study is low risk. .  No ST deviation was noted. .  Left ventricular function is normal. Nuclear stress EF: 78 %. The left ventricular ejection fraction is hyperdynamic (>65%). End diastolic cavity size is normal. .  Prior study available for comparison from 06/17/2020.  07/19/2021 echo 1. Left ventricular ejection fraction, by estimation, is 60 to 65%. The  left ventricle has normal function. The left ventricle has no regional  wall motion abnormalities. There is moderate concentric left ventricular  hypertrophy. Left ventricular  diastolic parameters are consistent with Grade I diastolic dysfunction  (impaired relaxation). The average left ventricular global longitudinal  strain is -18.7 %.  2. Right ventricular systolic function is normal. The right ventricular  size is normal.  3. The mitral valve is normal in structure. No evidence of mitral valve  regurgitation. No evidence of mitral stenosis.  4. The aortic valve is tricuspid. Aortic valve regurgitation is not  visualized. No aortic stenosis is present.  5. The inferior vena cava is normal in size with greater than 50%  respiratory variability, suggesting right atrial pressure of 3 mmHg.    Neuro/Psych Only residual forgetfulness TIACVA    GI/Hepatic GERD  ,   Endo/Other  diabetes  Renal/GU Renal diseaseLab Results      Component                Value               Date                      CREATININE               1.45 (H)            08/09/2021              K                        4.2                 08/09/2021                    Musculoskeletal  (+) Arthritis , Osteoarthritis,    Abdominal   Peds  Hematology Lab Results      Component                Value               Date                      WBC                      9.5                 08/24/2021  HGB                      10.9 (L)            08/24/2021                HCT                      34.6 (L)            08/24/2021                MCV                      103.6 (H)           08/24/2021                PLT                      188                 08/24/2021              Anesthesia Other Findings   Reproductive/Obstetrics                          Anesthesia Physical Anesthesia Plan  ASA: 2  Anesthesia Plan: Spinal   Post-op Pain Management: Minimal or no pain anticipated   Induction:   PONV Risk Score and Plan: Treatment may vary due to age or medical condition and Ondansetron  Airway Management Planned: Nasal Cannula  Additional Equipment:   Intra-op Plan:   Post-operative Plan:   Informed Consent:     Dental advisory given  Plan Discussed with: CRNA  Anesthesia Plan Comments: (See PAT note 08/24/2021)       Anesthesia Quick Evaluation

## 2021-08-30 NOTE — Progress Notes (Signed)
Anesthesia Chart Review   Case: [id number] Date/Time: 09/06/21 1155   Procedure: TOTAL HIP ARTHROPLASTY ANTERIOR APPROACH (Left: Hip)   Anesthesia type: Choice   Pre-op diagnosis: left hip osteoarthritis   Location: [facility] ROOM 10 / [facility]   Surgeons: [name], MD       DISCUSSION:76 y.o. former smoker with h/o HTN, TIA, PAF (on Eliquis), DM II, left hip OA scheduled for above procedure 09/06/2021 with Dr. [name].   Pt seen by cardiology 08/09/2021. Stress test ordered.  Low risk stress test 08/15/2021.   Pt advised to hold Eliquis 3 days prior to surgery.   Anticipate pt can proceed with planned procedure barring acute status change.   VS: BP 138/64   Pulse (!) 54   Temp 36.8 C (Oral)   Resp 16   Ht '5\' 6"'$  (1.676 m)   Wt 74.4 kg   SpO2 100%   BMI 26.47 kg/m   PROVIDERS: Raina Mina., MD is PCP   Cardiologist - Fransico Him, MD  Electrophysiologist - Virl Axe, MD LABS: Labs reviewed: Acceptable for surgery. (all labs ordered are listed, but only abnormal results are displayed)  Labs Reviewed  HEMOGLOBIN A1C - Abnormal; Notable for the following components:      Result Value   Hgb A1c MFr Bld 5.8 (*)    All other components within normal limits  CBC - Abnormal; Notable for the following components:   RBC 3.34 (*)    Hemoglobin 10.9 (*)    HCT 34.6 (*)    MCV 103.6 (*)    All other components within normal limits  GLUCOSE, CAPILLARY - Abnormal; Notable for the following components:   Glucose-Capillary 153 (*)    All other components within normal limits  SURGICAL PCR SCREEN  TYPE AND SCREEN     IMAGES:   EKG: 07/06/2021 Rate 49 bpm  Sinus bradycardia   CV: Myocardial Perfusion 08/15/2021   Findings are consistent with no  ischemia and no prior myocardial infarction. The study is low risk.   No ST deviation was noted.   Left ventricular function is normal. Nuclear stress EF: 78 %. The left ventricular ejection fraction is hyperdynamic (>65%).  End diastolic cavity size is normal.   Prior study available for comparison from 06/17/2020.  Echo 07/19/2021 1. Left ventricular ejection fraction, by estimation, is 60 to 65%. The  left ventricle has normal function. The left ventricle has no regional  wall motion abnormalities. There is moderate concentric left ventricular  hypertrophy. Left ventricular  diastolic parameters are consistent with Grade I diastolic dysfunction  (impaired relaxation). The average left ventricular global longitudinal  strain is -18.7 %.   2. Right ventricular systolic function is normal. The right ventricular  size is normal.   3. The mitral valve is normal in structure. No evidence of mitral valve  regurgitation. No evidence of mitral stenosis.   4. The aortic valve is tricuspid. Aortic valve regurgitation is not  visualized. No aortic stenosis is present.   5. The inferior vena cava is normal in size with greater than 50%  respiratory variability, suggesting right atrial pressure of 3 mmHg.  Past Medical History:  Diagnosis Date   Anxiety    Arthritis    CVA (cerebral vascular accident) ([place]) 2019   Diabetes mellitus type 2 in nonobese ([place])    Hyperlipidemia    Hypertension    PCP GAVE FOR HER  BLOOD PRESSURE   IBS (irritable bowel syndrome)    Memory  deficit    PUD (peptic ulcer disease)    TIA (transient ischemic attack) 2006    Past Surgical History:  Procedure Laterality Date   ABDOMINAL HYSTERECTOMY     BIOPSY THYROID  04/2014   COLON SURGERY     LAPAROSCOPY N/A 08/25/2015   Procedure: EXPLORATORY LAPAROTOMY, ILEOCECECTOMY, PRIMARY ANASTOMOSIS, LYSIS OF ADHESIONS;  Surgeon: Autumn Messing III, MD;  Location: [facility];  Service: General;  Laterality: N/A;   laparoscopy for fertility work up     [place] N/A 03/24/2014   Procedure: LOOP RECORDER IMPLANT;  Surgeon: Deboraha Sprang, MD;  [facility]: [facility] CATH LAB;  Service: Cardiovascular;  Laterality: N/A;   LUMBAR  LAMINECTOMY/DECOMPRESSION MICRODISCECTOMY  04/25/2011   Procedure: LUMBAR LAMINECTOMY/DECOMPRESSION MICRODISCECTOMY;  Surgeon: [name], MD;  Location: [place] [facility]URO ORS;  Service: Neurosurgery;  Laterality: Right;  Right Lumbar Four-Five Discectomy   OOPHORECTOMY     TONSILLECTOMY      MEDICATIONS:  amLODipine (NORVASC) 5 MG tablet   apixaban (ELIQUIS) 5 MG TABS tablet   azelastine (ASTELIN) 0.1 % nasal spray   Cholecalciferol (VITAMIN D) 125 MCG (5000 UT) CAPS   Coenzyme Q10 (COQ10) 100 MG CAPS   Ferrous Sulfate 90 (18 Fe) MG TABS   furosemide (LASIX) 20 MG tablet   isosorbide mononitrate (IMDUR) 60 MG 24 hr tablet   losartan (COZAAR) 50 MG tablet   nitroGLYCERIN (NITROSTAT) 0.4 MG SL tablet   OVER THE COUNTER MEDICATION   propranolol (INDERAL) 10 MG tablet   rosuvastatin (CRESTOR) 5 MG tablet   Zinc 50 MG TABS   No current facility-administered medications for this encounter.    Konrad Felix Ward, PA-C [facility] [phone]

## 2021-09-06 ENCOUNTER — Ambulatory Visit (HOSPITAL_COMMUNITY): Payer: Medicare Other

## 2021-09-06 ENCOUNTER — Observation Stay (HOSPITAL_COMMUNITY): Payer: Medicare Other

## 2021-09-06 ENCOUNTER — Ambulatory Visit (HOSPITAL_COMMUNITY): Payer: Medicare Other | Admitting: Physician Assistant

## 2021-09-06 ENCOUNTER — Encounter (HOSPITAL_COMMUNITY): Payer: Self-pay | Admitting: Orthopedic Surgery

## 2021-09-06 ENCOUNTER — Inpatient Hospital Stay (HOSPITAL_COMMUNITY)
Admission: AD | Admit: 2021-09-06 | Discharge: 2021-09-12 | DRG: 469 | Disposition: A | Discharge status: Home / Self Care | Payer: Medicare Other | Attending: Family Medicine | Admitting: Family Medicine

## 2021-09-06 ENCOUNTER — Encounter (HOSPITAL_COMMUNITY)
Admission: AD | Disposition: A | Discharge status: Home / Self Care | Payer: Self-pay | Source: Home / Self Care | Attending: Orthopedic Surgery

## 2021-09-06 ENCOUNTER — Ambulatory Visit (HOSPITAL_BASED_OUTPATIENT_CLINIC_OR_DEPARTMENT_OTHER): Payer: Medicare Other | Admitting: Certified Registered Nurse Anesthetist

## 2021-09-06 DIAGNOSIS — E118 Type 2 diabetes mellitus with unspecified complications: Secondary | ICD-10-CM | POA: Diagnosis present

## 2021-09-06 DIAGNOSIS — Z885 Allergy status to narcotic agent status: Secondary | ICD-10-CM

## 2021-09-06 DIAGNOSIS — Z781 Physical restraint status: Secondary | ICD-10-CM

## 2021-09-06 DIAGNOSIS — D539 Nutritional anemia, unspecified: Secondary | ICD-10-CM | POA: Diagnosis not present

## 2021-09-06 DIAGNOSIS — Z79899 Other long term (current) drug therapy: Secondary | ICD-10-CM

## 2021-09-06 DIAGNOSIS — Z7901 Long term (current) use of anticoagulants: Secondary | ICD-10-CM

## 2021-09-06 DIAGNOSIS — I872 Venous insufficiency (chronic) (peripheral): Secondary | ICD-10-CM | POA: Diagnosis present

## 2021-09-06 DIAGNOSIS — Z96642 Presence of left artificial hip joint: Secondary | ICD-10-CM

## 2021-09-06 DIAGNOSIS — F02B11 Dementia in other diseases classified elsewhere, moderate, with agitation: Secondary | ICD-10-CM | POA: Diagnosis present

## 2021-09-06 DIAGNOSIS — I6523 Occlusion and stenosis of bilateral carotid arteries: Secondary | ICD-10-CM | POA: Diagnosis present

## 2021-09-06 DIAGNOSIS — I5021 Acute systolic (congestive) heart failure: Secondary | ICD-10-CM | POA: Insufficient documentation

## 2021-09-06 DIAGNOSIS — Z8673 Personal history of transient ischemic attack (TIA), and cerebral infarction without residual deficits: Secondary | ICD-10-CM

## 2021-09-06 DIAGNOSIS — Z833 Family history of diabetes mellitus: Secondary | ICD-10-CM

## 2021-09-06 DIAGNOSIS — I130 Hypertensive heart and chronic kidney disease with heart failure and stage 1 through stage 4 chronic kidney disease, or unspecified chronic kidney disease: Secondary | ICD-10-CM | POA: Diagnosis present

## 2021-09-06 DIAGNOSIS — I214 Non-ST elevation (NSTEMI) myocardial infarction: Secondary | ICD-10-CM

## 2021-09-06 DIAGNOSIS — M1612 Unilateral primary osteoarthritis, left hip: Principal | ICD-10-CM | POA: Diagnosis present

## 2021-09-06 DIAGNOSIS — Z801 Family history of malignant neoplasm of trachea, bronchus and lung: Secondary | ICD-10-CM

## 2021-09-06 DIAGNOSIS — F02B0 Dementia in other diseases classified elsewhere, moderate, without behavioral disturbance, psychotic disturbance, mood disturbance, and anxiety: Secondary | ICD-10-CM | POA: Diagnosis present

## 2021-09-06 DIAGNOSIS — N179 Acute kidney failure, unspecified: Secondary | ICD-10-CM

## 2021-09-06 DIAGNOSIS — E559 Vitamin D deficiency, unspecified: Secondary | ICD-10-CM | POA: Diagnosis present

## 2021-09-06 DIAGNOSIS — Z9049 Acquired absence of other specified parts of digestive tract: Secondary | ICD-10-CM

## 2021-09-06 DIAGNOSIS — D72829 Elevated white blood cell count, unspecified: Secondary | ICD-10-CM | POA: Diagnosis not present

## 2021-09-06 DIAGNOSIS — E1122 Type 2 diabetes mellitus with diabetic chronic kidney disease: Secondary | ICD-10-CM | POA: Diagnosis present

## 2021-09-06 DIAGNOSIS — Z800 Family history of malignant neoplasm of digestive organs: Secondary | ICD-10-CM

## 2021-09-06 DIAGNOSIS — E785 Hyperlipidemia, unspecified: Secondary | ICD-10-CM | POA: Diagnosis present

## 2021-09-06 DIAGNOSIS — I480 Paroxysmal atrial fibrillation: Secondary | ICD-10-CM | POA: Diagnosis present

## 2021-09-06 DIAGNOSIS — M1610 Unilateral primary osteoarthritis, unspecified hip: Secondary | ICD-10-CM | POA: Diagnosis present

## 2021-09-06 DIAGNOSIS — I428 Other cardiomyopathies: Secondary | ICD-10-CM | POA: Diagnosis present

## 2021-09-06 DIAGNOSIS — K219 Gastro-esophageal reflux disease without esophagitis: Secondary | ICD-10-CM | POA: Diagnosis present

## 2021-09-06 DIAGNOSIS — F05 Delirium due to known physiological condition: Secondary | ICD-10-CM | POA: Diagnosis present

## 2021-09-06 DIAGNOSIS — N1831 Chronic kidney disease, stage 3a: Secondary | ICD-10-CM | POA: Diagnosis present

## 2021-09-06 DIAGNOSIS — Z90710 Acquired absence of both cervix and uterus: Secondary | ICD-10-CM

## 2021-09-06 DIAGNOSIS — G301 Alzheimer's disease with late onset: Secondary | ICD-10-CM | POA: Diagnosis present

## 2021-09-06 DIAGNOSIS — I471 Supraventricular tachycardia: Secondary | ICD-10-CM | POA: Diagnosis not present

## 2021-09-06 DIAGNOSIS — Z8711 Personal history of peptic ulcer disease: Secondary | ICD-10-CM

## 2021-09-06 DIAGNOSIS — E119 Type 2 diabetes mellitus without complications: Secondary | ICD-10-CM

## 2021-09-06 DIAGNOSIS — I2510 Atherosclerotic heart disease of native coronary artery without angina pectoris: Secondary | ICD-10-CM | POA: Diagnosis present

## 2021-09-06 DIAGNOSIS — Z87891 Personal history of nicotine dependence: Secondary | ICD-10-CM

## 2021-09-06 HISTORY — PX: TOTAL HIP ARTHROPLASTY: SHX124

## 2021-09-06 LAB — ABO/RH: ABO/RH(D): A POS

## 2021-09-06 SURGERY — ARTHROPLASTY, HIP, TOTAL, ANTERIOR APPROACH
Anesthesia: Spinal | Site: Hip | Laterality: Left

## 2021-09-06 MED ORDER — FUROSEMIDE 20 MG PO TABS
20.0000 mg | ORAL_TABLET | Freq: Every day | ORAL | Status: DC
  Administered 2021-09-07 – 2021-09-09 (×3): 20 mg via ORAL
  Filled 2021-09-06 (×3): qty 1

## 2021-09-06 MED ORDER — ONDANSETRON HCL 4 MG/2ML IJ SOLN
INTRAMUSCULAR | Status: DC | PRN
  Administered 2021-09-06: 4 mg via INTRAVENOUS

## 2021-09-06 MED ORDER — DOCUSATE SODIUM 100 MG PO CAPS
100.0000 mg | ORAL_CAPSULE | Freq: Two times a day (BID) | ORAL | Status: DC
  Administered 2021-09-06 – 2021-09-12 (×11): 100 mg via ORAL
  Filled 2021-09-06 (×12): qty 1

## 2021-09-06 MED ORDER — BUPIVACAINE-EPINEPHRINE (PF) 0.25% -1:200000 IJ SOLN
INTRAMUSCULAR | Status: DC | PRN
  Administered 2021-09-06: 30 mL via PERINEURAL

## 2021-09-06 MED ORDER — FERROUS SULFATE 325 (65 FE) MG PO TABS
325.0000 mg | ORAL_TABLET | Freq: Every day | ORAL | Status: DC
  Administered 2021-09-07 – 2021-09-12 (×6): 325 mg via ORAL
  Filled 2021-09-06 (×6): qty 1

## 2021-09-06 MED ORDER — PHENYLEPHRINE 80 MCG/ML (10ML) SYRINGE FOR IV PUSH (FOR BLOOD PRESSURE SUPPORT)
PREFILLED_SYRINGE | INTRAVENOUS | Status: AC
  Filled 2021-09-06: qty 10

## 2021-09-06 MED ORDER — MIDAZOLAM HCL 2 MG/2ML IJ SOLN
INTRAMUSCULAR | Status: AC
  Filled 2021-09-06: qty 2

## 2021-09-06 MED ORDER — ACETAMINOPHEN 10 MG/ML IV SOLN: 1000.0000 mg | Freq: Once | INTRAVENOUS | Status: DC | PRN

## 2021-09-06 MED ORDER — ACETAMINOPHEN 325 MG PO TABS
325.0000 mg | ORAL_TABLET | Freq: Four times a day (QID) | ORAL | Status: DC | PRN
  Administered 2021-09-07 – 2021-09-11 (×6): 650 mg via ORAL
  Filled 2021-09-06 (×6): qty 2

## 2021-09-06 MED ORDER — ORAL CARE MOUTH RINSE: 15.0000 mL | Freq: Once | OROMUCOSAL | Status: AC

## 2021-09-06 MED ORDER — METOCLOPRAMIDE HCL 5 MG PO TABS: 5.0000 mg | ORAL_TABLET | Freq: Three times a day (TID) | ORAL | Status: DC | PRN

## 2021-09-06 MED ORDER — PROPRANOLOL HCL 10 MG PO TABS
10.0000 mg | ORAL_TABLET | ORAL | Status: DC | PRN
  Filled 2021-09-06: qty 1

## 2021-09-06 MED ORDER — DEXAMETHASONE SODIUM PHOSPHATE 10 MG/ML IJ SOLN
10.0000 mg | Freq: Once | INTRAMUSCULAR | Status: AC
  Administered 2021-09-07: 10 mg via INTRAVENOUS
  Filled 2021-09-06: qty 1

## 2021-09-06 MED ORDER — METHOCARBAMOL 500 MG IVPB - SIMPLE MED: 500.0000 mg | Freq: Four times a day (QID) | INTRAVENOUS | Status: DC | PRN

## 2021-09-06 MED ORDER — ACETAMINOPHEN 10 MG/ML IV SOLN
1000.0000 mg | Freq: Four times a day (QID) | INTRAVENOUS | Status: DC
  Administered 2021-09-06: 1000 mg via INTRAVENOUS
  Filled 2021-09-06: qty 100

## 2021-09-06 MED ORDER — PROPRANOLOL HCL 10 MG PO TABS: 10.0000 mg | ORAL_TABLET | ORAL | Status: DC | PRN

## 2021-09-06 MED ORDER — TRAMADOL HCL 50 MG PO TABS
50.0000 mg | ORAL_TABLET | Freq: Four times a day (QID) | ORAL | Status: DC | PRN
  Administered 2021-09-06: 100 mg via ORAL
  Filled 2021-09-06: qty 2

## 2021-09-06 MED ORDER — LACTATED RINGERS IV SOLN: INTRAVENOUS | Status: DC

## 2021-09-06 MED ORDER — MENTHOL 3 MG MT LOZG
1.0000 | LOZENGE | OROMUCOSAL | Status: DC | PRN
Start: 2021-09-06 — End: 2021-09-12

## 2021-09-06 MED ORDER — BUPIVACAINE-EPINEPHRINE (PF) 0.25% -1:200000 IJ SOLN
INTRAMUSCULAR | Status: AC
  Filled 2021-09-06: qty 30

## 2021-09-06 MED ORDER — PROPOFOL 1000 MG/100ML IV EMUL
INTRAVENOUS | Status: AC
  Filled 2021-09-06: qty 100

## 2021-09-06 MED ORDER — BISACODYL 10 MG RE SUPP: 10.0000 mg | Freq: Every day | RECTAL | Status: DC | PRN

## 2021-09-06 MED ORDER — HYDROMORPHONE HCL 2 MG PO TABS
2.0000 mg | ORAL_TABLET | ORAL | Status: DC | PRN
  Administered 2021-09-06: 2 mg via ORAL
  Filled 2021-09-06: qty 1

## 2021-09-06 MED ORDER — CEFAZOLIN SODIUM-DEXTROSE 2-4 GM/100ML-% IV SOLN
2.0000 g | Freq: Four times a day (QID) | INTRAVENOUS | Status: AC
  Administered 2021-09-06 (×2): 2 g via INTRAVENOUS
  Filled 2021-09-06 (×2): qty 100

## 2021-09-06 MED ORDER — PHENOL 1.4 % MT LIQD
1.0000 | OROMUCOSAL | Status: DC | PRN
Start: 2021-09-06 — End: 2021-09-12

## 2021-09-06 MED ORDER — NITROGLYCERIN 0.4 MG SL SUBL
0.4000 mg | SUBLINGUAL_TABLET | SUBLINGUAL | Status: DC | PRN
  Administered 2021-09-08: 0.4 mg via SUBLINGUAL

## 2021-09-06 MED ORDER — TRANEXAMIC ACID-NACL 1000-0.7 MG/100ML-% IV SOLN
1000.0000 mg | INTRAVENOUS | Status: AC
  Administered 2021-09-06: 1000 mg via INTRAVENOUS
  Filled 2021-09-06: qty 100

## 2021-09-06 MED ORDER — CHLORHEXIDINE GLUCONATE 0.12 % MT SOLN
15.0000 mL | Freq: Once | OROMUCOSAL | Status: AC
  Administered 2021-09-06: 15 mL via OROMUCOSAL

## 2021-09-06 MED ORDER — MIDAZOLAM HCL 5 MG/5ML IJ SOLN
INTRAMUSCULAR | Status: DC | PRN
  Administered 2021-09-06: 1 mg via INTRAVENOUS

## 2021-09-06 MED ORDER — EPHEDRINE SULFATE (PRESSORS) 50 MG/ML IJ SOLN
INTRAMUSCULAR | Status: DC | PRN
  Administered 2021-09-06 (×2): 5 mg via INTRAVENOUS

## 2021-09-06 MED ORDER — PROPOFOL 500 MG/50ML IV EMUL
INTRAVENOUS | Status: DC | PRN
  Administered 2021-09-06: 50 ug/kg/min via INTRAVENOUS

## 2021-09-06 MED ORDER — ISOSORBIDE MONONITRATE ER 60 MG PO TB24
60.0000 mg | ORAL_TABLET | Freq: Every day | ORAL | Status: DC
  Administered 2021-09-07 – 2021-09-10 (×4): 60 mg via ORAL
  Filled 2021-09-06 (×6): qty 1

## 2021-09-06 MED ORDER — POVIDONE-IODINE 10 % EX SWAB
2.0000 "application " | Freq: Once | CUTANEOUS | Status: AC
  Administered 2021-09-06: 2 via TOPICAL

## 2021-09-06 MED ORDER — CEFAZOLIN SODIUM-DEXTROSE 2-4 GM/100ML-% IV SOLN
2.0000 g | INTRAVENOUS | Status: AC
  Administered 2021-09-06: 2 g via INTRAVENOUS
  Filled 2021-09-06: qty 100

## 2021-09-06 MED ORDER — ONDANSETRON HCL 4 MG/2ML IJ SOLN
4.0000 mg | Freq: Four times a day (QID) | INTRAMUSCULAR | Status: DC | PRN
  Administered 2021-09-11: 4 mg via INTRAVENOUS
  Filled 2021-09-06: qty 2

## 2021-09-06 MED ORDER — ROSUVASTATIN CALCIUM 5 MG PO TABS
5.0000 mg | ORAL_TABLET | Freq: Every day | ORAL | Status: DC
  Administered 2021-09-07 – 2021-09-09 (×3): 5 mg via ORAL
  Filled 2021-09-06 (×3): qty 1

## 2021-09-06 MED ORDER — AMLODIPINE BESYLATE 5 MG PO TABS
5.0000 mg | ORAL_TABLET | Freq: Every day | ORAL | Status: DC
  Administered 2021-09-08 – 2021-09-10 (×3): 5 mg via ORAL
  Filled 2021-09-06 (×4): qty 1

## 2021-09-06 MED ORDER — PROPOFOL 10 MG/ML IV BOLUS
INTRAVENOUS | Status: AC
  Filled 2021-09-06: qty 20

## 2021-09-06 MED ORDER — METHOCARBAMOL 500 MG PO TABS
500.0000 mg | ORAL_TABLET | Freq: Four times a day (QID) | ORAL | Status: DC | PRN
  Administered 2021-09-06 – 2021-09-11 (×5): 500 mg via ORAL
  Filled 2021-09-06 (×5): qty 1

## 2021-09-06 MED ORDER — SODIUM CHLORIDE 0.9 % IV SOLN: INTRAVENOUS | Status: DC

## 2021-09-06 MED ORDER — BUPIVACAINE IN DEXTROSE 0.75-8.25 % IT SOLN
INTRATHECAL | Status: DC | PRN
  Administered 2021-09-06: 12 mg via INTRATHECAL

## 2021-09-06 MED ORDER — ONDANSETRON HCL 4 MG/2ML IJ SOLN
INTRAMUSCULAR | Status: AC
  Filled 2021-09-06: qty 2

## 2021-09-06 MED ORDER — DEXAMETHASONE SODIUM PHOSPHATE 10 MG/ML IJ SOLN
INTRAMUSCULAR | Status: AC
  Filled 2021-09-06: qty 1

## 2021-09-06 MED ORDER — FENTANYL CITRATE PF 50 MCG/ML IJ SOSY
25.0000 ug | PREFILLED_SYRINGE | INTRAMUSCULAR | Status: DC | PRN
  Administered 2021-09-06: 50 ug via INTRAVENOUS

## 2021-09-06 MED ORDER — FENTANYL CITRATE PF 50 MCG/ML IJ SOSY
PREFILLED_SYRINGE | INTRAMUSCULAR | Status: AC
  Filled 2021-09-06: qty 1

## 2021-09-06 MED ORDER — ONDANSETRON HCL 4 MG/2ML IJ SOLN
4.0000 mg | Freq: Once | INTRAMUSCULAR | Status: DC | PRN
Start: 2021-09-06 — End: 2021-09-06

## 2021-09-06 MED ORDER — POLYETHYLENE GLYCOL 3350 17 G PO PACK
17.0000 g | PACK | Freq: Every day | ORAL | Status: DC | PRN
  Filled 2021-09-06: qty 1

## 2021-09-06 MED ORDER — DEXAMETHASONE SODIUM PHOSPHATE 10 MG/ML IJ SOLN
8.0000 mg | Freq: Once | INTRAMUSCULAR | Status: AC
  Administered 2021-09-06: 5 mg via INTRAVENOUS

## 2021-09-06 MED ORDER — LOSARTAN POTASSIUM 50 MG PO TABS
50.0000 mg | ORAL_TABLET | Freq: Every day | ORAL | Status: DC
  Administered 2021-09-08 – 2021-09-11 (×4): 50 mg via ORAL
  Filled 2021-09-06 (×4): qty 1

## 2021-09-06 MED ORDER — APIXABAN 2.5 MG PO TABS: 2.5000 mg | ORAL_TABLET | Freq: Two times a day (BID) | ORAL | Status: DC

## 2021-09-06 MED ORDER — 0.9 % SODIUM CHLORIDE (POUR BTL) OPTIME
TOPICAL | Status: DC | PRN
  Administered 2021-09-06: 1000 mL

## 2021-09-06 MED ORDER — HYDROMORPHONE HCL 1 MG/ML IJ SOLN
0.5000 mg | INTRAMUSCULAR | Status: DC | PRN
  Administered 2021-09-06 – 2021-09-07 (×3): 1 mg via INTRAVENOUS
  Filled 2021-09-06 (×3): qty 1

## 2021-09-06 MED ORDER — WATER FOR IRRIGATION, STERILE IR SOLN
Status: DC | PRN
  Administered 2021-09-06: 2000 mL

## 2021-09-06 MED ORDER — EPHEDRINE 5 MG/ML INJ
INTRAVENOUS | Status: AC
  Filled 2021-09-06: qty 5

## 2021-09-06 MED ORDER — METOCLOPRAMIDE HCL 5 MG/ML IJ SOLN: 5.0000 mg | Freq: Three times a day (TID) | INTRAMUSCULAR | Status: DC | PRN

## 2021-09-06 MED ORDER — ONDANSETRON HCL 4 MG PO TABS: 4.0000 mg | ORAL_TABLET | Freq: Four times a day (QID) | ORAL | Status: DC | PRN

## 2021-09-06 MED ORDER — FENTANYL CITRATE (PF) 100 MCG/2ML IJ SOLN
INTRAMUSCULAR | Status: AC
  Filled 2021-09-06: qty 2

## 2021-09-06 SURGICAL SUPPLY — 49 items
BAG COUNTER SPONGE SURGICOUNT (BAG) IMPLANT
BAG DECANTER FOR FLEXI CONT (MISCELLANEOUS) IMPLANT
BAG SPEC THK2 15X12 ZIP CLS (MISCELLANEOUS)
BAG SPNG CNTER NS LX DISP (BAG)
BAG ZIPLOCK 12X15 (MISCELLANEOUS) IMPLANT
BLADE SAG 18X100X1.27 (BLADE) ×2 IMPLANT
CLSR STERI-STRIP ANTIMIC 1/2X4 (GAUZE/BANDAGES/DRESSINGS) ×1 IMPLANT
COVER PERINEAL POST (MISCELLANEOUS) ×2 IMPLANT
COVER SURGICAL LIGHT HANDLE (MISCELLANEOUS) ×2 IMPLANT
CUP ACET PINNACLE SECTR 50MM (Hips) IMPLANT
DRAPE FOOT SWITCH (DRAPES) ×2 IMPLANT
DRAPE STERI IOBAN 125X83 (DRAPES) ×2 IMPLANT
DRAPE U-SHAPE 47X51 STRL (DRAPES) ×4 IMPLANT
DRSG AQUACEL AG ADV 3.5X10 (GAUZE/BANDAGES/DRESSINGS) ×2 IMPLANT
DURAPREP 26ML APPLICATOR (WOUND CARE) ×2 IMPLANT
ELECT REM PT RETURN 15FT ADLT (MISCELLANEOUS) ×2 IMPLANT
GLOVE BIO SURGEON STRL SZ 6.5 (GLOVE) IMPLANT
GLOVE BIO SURGEON STRL SZ7.5 (GLOVE) IMPLANT
GLOVE BIO SURGEON STRL SZ8 (GLOVE) ×2 IMPLANT
GLOVE BIOGEL PI IND STRL 6.5 (GLOVE) IMPLANT
GLOVE BIOGEL PI IND STRL 7.0 (GLOVE) IMPLANT
GLOVE BIOGEL PI IND STRL 8 (GLOVE) ×1 IMPLANT
GLOVE BIOGEL PI INDICATOR 6.5 (GLOVE)
GLOVE BIOGEL PI INDICATOR 7.0 (GLOVE)
GLOVE BIOGEL PI INDICATOR 8 (GLOVE) ×1
GOWN STRL REUS W/ TWL LRG LVL3 (GOWN DISPOSABLE) ×1 IMPLANT
GOWN STRL REUS W/ TWL XL LVL3 (GOWN DISPOSABLE) IMPLANT
GOWN STRL REUS W/TWL LRG LVL3 (GOWN DISPOSABLE) ×2
GOWN STRL REUS W/TWL XL LVL3 (GOWN DISPOSABLE)
HEAD FEMORAL 32 CERAMIC (Hips) ×1 IMPLANT
HOLDER FOLEY CATH W/STRAP (MISCELLANEOUS) ×2 IMPLANT
KIT TURNOVER KIT A (KITS) IMPLANT
LINER ACET PNNCL PLUS4 NEUTRAL (Hips) IMPLANT
MANIFOLD NEPTUNE II (INSTRUMENTS) ×2 IMPLANT
PACK ANTERIOR HIP CUSTOM (KITS) ×2 IMPLANT
PENCIL SMOKE EVACUATOR COATED (MISCELLANEOUS) ×2 IMPLANT
PINNACLE PLUS 4 NEUTRAL (Hips) ×2 IMPLANT
PINNACLE SECTOR CUP 50MM (Hips) ×2 IMPLANT
SPIKE FLUID TRANSFER (MISCELLANEOUS) ×2 IMPLANT
STEM FEMORAL SZ 5MM STD ACTIS (Stem) ×1 IMPLANT
STRIP CLOSURE SKIN 1/2X4 (GAUZE/BANDAGES/DRESSINGS) ×2 IMPLANT
SUT ETHIBOND NAB CT1 #1 30IN (SUTURE) ×2 IMPLANT
SUT MNCRL AB 4-0 PS2 18 (SUTURE) ×2 IMPLANT
SUT STRATAFIX 0 PDS 27 VIOLET (SUTURE) ×2
SUT VIC AB 2-0 CT1 27 (SUTURE) ×4
SUT VIC AB 2-0 CT1 TAPERPNT 27 (SUTURE) ×2 IMPLANT
SUTURE STRATFX 0 PDS 27 VIOLET (SUTURE) ×1 IMPLANT
TRAY FOLEY MTR SLVR 16FR STAT (SET/KITS/TRAYS/PACK) ×2 IMPLANT
TUBE SUCTION HIGH CAP CLEAR NV (SUCTIONS) ×2 IMPLANT

## 2021-09-06 NOTE — Evaluation (Signed)
Physical Therapy Evaluation Patient Details Name: Tiffany Wiggins MRN: [id number] DOB: April 15, 1945 Today's Date: 09/06/2021  History of Present Illness  Pt is a 76yo female presenting s/p L-THA, AA on 09/06/21. PMH: hx of CVA, DM, HLD, HTN, IBS, hx of Tia, lumbar L4-L5 decompression, adominal exploratory laparotomy 2017, loop recorder implant 2015, PAF, dementia.  Clinical Impression  Tiffany Wiggins is a 76 y.o. female POD 0 s/p L-THA, AA. Patient reports she requires assist with mobility at baseline including use of RW as well as lift assist to stand up from the toilet and stand-by assist for safety while showering. Patient is now limited by functional impairments (see PT problem list below) and requires min guard for transfers, after standing from the EOB, pt reporting increased pain, directed pt to complete step pivot transfer to recliner, further mobility deferred, RN notified. Patient instructed in exercise to facilitate ROM and circulation to manage edema. Patient will benefit from continued skilled PT interventions to address impairments and progress towards PLOF. Acute PT will follow to progress mobility and stair training in preparation for safe discharge home.       Recommendations for follow up therapy are one component of a multi-disciplinary discharge planning process, led by the attending physician.  Recommendations may be updated based on patient status, additional functional criteria and insurance authorization.  Follow Up Recommendations Follow physician's recommendations for discharge plan and follow up therapies    Assistance Recommended at Discharge Frequent or constant Supervision/Assistance  Patient can return home with the following  A little help with walking and/or transfers;A little help with bathing/dressing/bathroom;Assistance with cooking/housework;Assist for transportation;Help with stairs or ramp for entrance    Equipment Recommendations None recommended by PT   Recommendations for Other Services       Functional Status Assessment Patient has had a recent decline in their functional status and demonstrates the ability to make significant improvements in function in a reasonable and predictable amount of time.     Precautions / Restrictions Precautions Precautions: Fall Restrictions Weight Bearing Restrictions: No Other Position/Activity Restrictions: WBAT      Mobility  Bed Mobility Overal bed mobility: Needs Assistance Bed Mobility: Supine to Sit     Supine to sit: Min assist     General bed mobility comments: Pt required min assist to bring LLE off bed, otherwise min guard for safety.    Transfers Overall transfer level: Needs assistance Equipment used: Rolling walker (2 wheels) Transfers: Sit to/from Stand, Bed to chair/wheelchair/BSC Sit to Stand: Min guard, From elevated surface   Step pivot transfers: Min guard, +2 safety/equipment       General transfer comment: Sit to stand: Pt min guard from elevated surface for safety only, no physical assist required, VCs for sequencing. Upon standing pt reporting increased level of pain, unable to fully weightbear during standing BLE mobility testing. Directed pt to complete step pivot transfer. Step pivot: pt min guard +2 for equipment, VCs for sequencing. Further mobility deferred secondary to pain.    Ambulation/Gait               General Gait Details: deferred  Stairs            Wheelchair Mobility    Modified Rankin (Stroke Patients Only)       Balance Overall balance assessment: Needs assistance Sitting-balance support: Feet supported, No upper extremity supported Sitting balance-Leahy Scale: Fair     Standing balance support: Reliant on assistive device for balance, During functional activity, Bilateral upper  extremity supported Standing balance-Leahy Scale: Poor                               Pertinent Vitals/Pain Pain  Assessment Pain Assessment: 0-10 Pain Score: 7  Pain Location: left hip Pain Descriptors / Indicators: Operative site guarding, Discomfort Pain Intervention(s): Ice applied, Repositioned, Monitored during session, Limited activity within patient's tolerance    Home Living Family/patient expects to be discharged to:: Private residence Living Arrangements: Spouse/significant other Available Help at Discharge: Family;Available 24 hours/day Type of Home: House Home Access: Stairs to enter Entrance Stairs-Rails: Left Entrance Stairs-Number of Steps: 3   Home Layout: One level Home Equipment: Animator (2 wheels);Cane - single point;Grab bars - tub/shower Additional Comments: Pt's husband [name] reporting the bathroom door/entry is 21" wide, is concerned about safe mobility patterns to get inside.    Prior Function Prior Level of Function : Independent/Modified Independent             Mobility Comments: Assistance to rise from toilet, uses RW during painful periods ADLs Comments: ind     Hand Dominance        Extremity/Trunk Assessment   Upper Extremity Assessment Upper Extremity Assessment: Overall WFL for tasks assessed    Lower Extremity Assessment Lower Extremity Assessment: LLE deficits/detail;RLE deficits/detail RLE Deficits / Details: MMT ank DF/PF 4/5 RLE Sensation: WNL LLE Deficits / Details: MMT ank DF/PF 4/5 LLE Sensation: WNL    Cervical / Trunk Assessment Cervical / Trunk Assessment: Back Surgery  Communication      Cognition Arousal/Alertness: Awake/alert Behavior During Therapy: WFL for tasks assessed/performed, Flat affect Overall Cognitive Status: History of cognitive impairments - at baseline                                 General Comments: Pt has history of stroke, dementia, poor short-term memory, able to follow multistep commands consistently.        General Comments General comments (skin integrity, edema,  etc.): Husband [name] present for session    Exercises Total Joint Exercises Ankle Circles/Pumps: AROM, 20 reps, Both Other Exercises Other Exercises: incentive spirometry x5, VCs for inhaling slow and controlled   Assessment/Plan    PT Assessment Patient needs continued PT services  PT Problem List Decreased strength;Decreased range of motion;Decreased activity tolerance;Decreased balance;Decreased mobility;Decreased coordination;Decreased cognition;Decreased knowledge of use of DME;Pain       PT Treatment Interventions DME instruction;Gait training;Stair training;Functional mobility training;Therapeutic activities;Therapeutic exercise;Balance training;Neuromuscular re-education;Patient/family education    PT Goals (Current goals can be found in the Care Plan section)  Acute Rehab PT Goals Patient Stated Goal: To walk without pain PT Goal Formulation: With patient Time For Goal Achievement: 09/13/21 Potential to Achieve Goals: Good    Frequency 7X/week     Co-evaluation               AM-PAC PT "6 Clicks" Mobility  Outcome Measure Help needed turning from your back to your side while in a flat bed without using bedrails?: None Help needed moving from lying on your back to sitting on the side of a flat bed without using bedrails?: A Little Help needed moving to and from a bed to a chair (including a wheelchair)?: A Little Help needed standing up from a chair using your arms (e.g., wheelchair or bedside chair)?: A Little Help needed to walk in hospital room?:  A Little Help needed climbing 3-5 steps with a railing? : A Little 6 Click Score: 19    End of Session Equipment Utilized During Treatment: Gait belt Activity Tolerance: Patient limited by pain Patient left: in chair;with call bell/phone within reach;with chair alarm set;with family/visitor present Nurse Communication: Mobility status PT Visit Diagnosis: Difficulty in walking, not elsewhere classified  (R26.2);Pain Pain - Right/Left: Left Pain - part of body: Hip    Time: 7955-8316 PT Time Calculation (min) (ACUTE ONLY): 31 min   Charges:   PT Evaluation $PT Eval Low Complexity: 1 Low PT Treatments $Therapeutic Activity: 8-22 mins       Coolidge Breeze, PT, DPT [facility] Office: [phone] Pager: [phone]  Coolidge Breeze 09/06/2021, 6:52 PM

## 2021-09-06 NOTE — Discharge Instructions (Addendum)
Tiffany Wiggins,  You were in the hospital for a left hip replacement and suffered an event concerning for a heart attack. Your heart function has decreased and you have been started on new medications. Thankfully, your heart vessels did not have evidence of a bad blockage. Please follow-up with your PCP and heart doctor. You need a repeat metabolic panel in 2 days to check on your kidney function     Tiffany Arabian, MD Total Joint Specialist [facility] [place] [address], [address] [place] Wigg[place], Tiffany Wigg[place] [address] [phone]  ANTERIOR APPROACH TOTAL HIP REPLACEMENT POSTOPERATIVE DIRECTIONS     Hip Rehabilitation, Guidelines Following Surgery  The results of a hip operation are greatly improved after range of motion and muscle strengthening exercises. Follow all safety measures which are given to protect your hip. If any of these exercises cause increased pain or swelling in your joint, decrease the amount until you are comfortable again. Then slowly increase the exercises. Call your caregiver if you have problems or questions.   HOME CARE INSTRUCTIONS  Remove items at home which could result in a fall. This includes throw rugs or furniture in walking pathways.  ICE to the affected hip as frequently as 20-30 minutes an hour and then as needed for pain and swelling. Continue to use ice on the hip for pain and swelling from surgery. You may notice swelling that will progress down to the foot and ankle. This is normal after surgery. Elevate the leg when you are not up walking on it.   Continue to use the breathing machine which will help keep your temperature down.  It is common for your temperature to cycle up and down following surgery, especially at night when you are not up moving around and exerting yourself.  The breathing machine keeps your lungs expanded and your temperature down.  DIET You may resume your previous home diet once your are discharged from the hospital.  DRESSING  / WOUND CARE / SHOWERING You have an adhesive waterproof bandage over the incision. Leave this in place until your first follow-up appointment. Once you remove this you will not need to place another bandage.  You may begin showering 3 days following surgery, but do not submerge the incision under water.  ACTIVITY For the first 3-5 days, it is important to rest and keep the operative leg elevated. You should, as a general rule, rest for 50 minutes and walk/stretch for 10 minutes per hour. After 5 days, you may slowly increase activity as tolerated.  Perform the exercises you were provided twice a day for about 15-20 minutes each session. Begin these 2 days following surgery. Walk with your walker as instructed. Use the walker until you are comfortable transitioning to a cane. Walk with the cane in the opposite hand of the operative leg. You may discontinue the cane once you are comfortable and walking steadily. Avoid periods of inactivity such as sitting longer than an hour when not asleep. This helps prevent blood clots.  Do not drive a car for 6 weeks or until released by your surgeon.  Do not drive while taking narcotics.  TED HOSE STOCKINGS Wear the elastic stockings on both legs for three weeks following surgery during the day. You may remove them at night while sleeping.  WEIGHT BEARING Weight bearing as tolerated with assist device (walker, cane, etc) as directed, use it as long as suggested by your surgeon or therapist, typically at least 4-6 weeks.  POSTOPERATIVE CONSTIPATION PROTOCOL Constipation -  defined medically as fewer than three stools per week and severe constipation as less than one stool per week.  One of the most common issues patients have following surgery is constipation.  Even if you have a regular bowel pattern at home, your normal regimen is likely to be disrupted due to multiple reasons following surgery.  Combination of anesthesia, postoperative narcotics, change in  appetite and fluid intake all can affect your bowels.  In order to avoid complications following surgery, here are some recommendations in order to help you during your recovery period.  Colace (docusate) - Pick up an over-the-counter form of Colace or another stool softener and take twice a day as long as you are requiring postoperative pain medications.  Take with a full glass of water daily.  If you experience loose stools or diarrhea, hold the colace until you stool forms back up.  If your symptoms do not get better within 1 week or if they get worse, check with your doctor. Dulcolax (bisacodyl) - Pick up over-the-counter and take as directed by the product packaging as needed to assist with the movement of your bowels.  Take with a full glass of water.  Use this product as needed if not relieved by Colace only.  MiraLax (polyethylene glycol) - Pick up over-the-counter to have on hand.  MiraLax is a solution that will increase the amount of water in your bowels to assist with bowel movements.  Take as directed and can mix with a glass of water, juice, soda, coffee, or tea.  Take if you go more than two days without a movement.Do not use MiraLax more than once per day. Call your doctor if you are still constipated or irregular after using this medication for 7 days in a row.  If you continue to have problems with postoperative constipation, please contact the office for further assistance and recommendations.  If you experience "the worst abdominal pain ever" or develop nausea or vomiting, please contact the office immediatly for further recommendations for treatment.  ITCHING  If you experience itching with your medications, try taking only a single pain pill, or even half a pain pill at a time.  You can also use Benadryl over the counter for itching or also to help with sleep.   MEDICATIONS See your medication summary on the "After Visit Summary" that the nursing staff will review with you prior to  discharge.  You may have some home medications which will be placed on hold until you complete the course of blood thinner medication.  It is important for you to complete the blood thinner medication as prescribed by your surgeon.  Continue your approved medications as instructed at time of discharge.  PRECAUTIONS If you experience chest pain or shortness of breath - call 911 immediately for transfer to the hospital emergency department.  If you develop a fever greater that 101 F, purulent drainage from wound, increased redness or drainage from wound, foul odor from the wound/dressing, or calf pain - CONTACT YOUR SURGEON.                                                   FOLLOW-UP APPOINTMENTS Make sure you keep all of your appointments after your operation with your surgeon and caregivers. You should call the office at the above phone number and make an appointment  for approximately two weeks after the date of your surgery or on the date instructed by your surgeon outlined in the "After Visit Summary".  RANGE OF MOTION AND STRENGTHENING EXERCISES  These exercises are designed to help you keep full movement of your hip joint. Follow your caregiver's or physical therapist's instructions. Perform all exercises about fifteen times, three times per day or as directed. Exercise both hips, even if you have had only one joint replacement. These exercises can be done on a training (exercise) mat, on the floor, on a table or on a bed. Use whatever works the best and is most comfortable for you. Use music or television while you are exercising so that the exercises are a pleasant break in your day. This will make your life better with the exercises acting as a break in routine you can look forward to.  Lying on your back, slowly slide your foot toward your buttocks, raising your knee up off the floor. Then slowly slide your foot back down until your leg is straight again.  Lying on your back spread your legs as  far apart as you can without causing discomfort.  Lying on your side, raise your upper leg and foot straight up from the floor as far as is comfortable. Slowly lower the leg and repeat.  Lying on your back, tighten up the muscle in the front of your thigh (quadriceps muscles). You can do this by keeping your leg straight and trying to raise your heel off the floor. This helps strengthen the largest muscle supporting your knee.  Lying on your back, tighten up the muscles of your buttocks both with the legs straight and with the knee bent at a comfortable angle while keeping your heel on the floor.   POST-OPERATIVE OPIOID TAPER INSTRUCTIONS: It is important to wean off of your opioid medication as soon as possible. If you do not need pain medication after your surgery it is ok to stop day one. Opioids include: Codeine, Hydrocodone(Norco, Vicodin), Oxycodone(Percocet, oxycontin) and hydromorphone amongst others.  Long term and even short term use of opiods can cause: Increased pain response Dependence Constipation Depression Respiratory depression And more.  Withdrawal symptoms can include Flu like symptoms Nausea, vomiting And more Techniques to manage these symptoms Hydrate well Eat regular healthy meals Stay active Use relaxation techniques(deep breathing, meditating, yoga) Do Not substitute Alcohol to help with tapering If you have been on opioids for less than two weeks and do not have pain than it is ok to stop all together.  Plan to wean off of opioids This plan should start within one week post op of your joint replacement. Maintain the same interval or time between taking each dose and first decrease the dose.  Cut the total daily intake of opioids by one tablet each day Next start to increase the time between doses. The last dose that should be eliminated is the evening dose.   IF YOU ARE TRANSFERRED TO A SKILLED REHAB FACILITY If the patient is transferred to a skilled  rehab facility following release from the hospital, a list of the current medications will be sent to the facility for the patient to continue.  When discharged from the skilled rehab facility, please have the facility set up the patient's [place] prior to being released. Also, the skilled facility will be responsible for providing the patient with their medications at time of release from the facility to include their pain medication, the muscle relaxants, and  their blood thinner medication. If the patient is still at the rehab facility at time of the two week follow up appointment, the skilled rehab facility will also need to assist the patient in arranging follow up appointment in our office and any transportation needs.  MAKE SURE YOU:  Understand these instructions.  Get help right away if you are not doing well or get worse.    DENTAL ANTIBIOTICS:  In most cases prophylactic antibiotics for Dental procdeures after total joint surgery are not necessary.  Exceptions are as follows:  1. History of prior total joint infection  2. Severely immunocompromised (Organ Transplant, cancer chemotherapy, Rheumatoid biologic meds such as [place])  3. Poorly controlled diabetes (A1C &gt; 8.0, blood glucose over 200)  If you have one of these conditions, contact your surgeon for an antibiotic prescription, prior to your dental procedure.    Pick up stool softner and laxative for home use following surgery while on pain medications. Do not submerge incision under water. Please use good hand washing techniques while changing dressing each day. May shower starting three days after surgery. Please use a clean towel to pat the incision dry following showers. Continue to use ice for pain and swelling after surgery. Do not use any lotions or creams on the incision until instructed by your surgeon.

## 2021-09-06 NOTE — Transfer of Care (Signed)
Immediate Anesthesia Transfer of Care Note  Patient: Tiffany Wiggins  Procedure(s) Performed: TOTAL HIP ARTHROPLASTY ANTERIOR APPROACH (Left: Hip)  Patient Location: PACU  Anesthesia Type:Spinal  Level of Consciousness: awake  Airway & Oxygen Therapy: Patient Spontanous Breathing  Post-op Assessment: Report given to RN  Post vital signs: stable  Last Vitals:  Vitals Value Taken Time  BP 132/57 09/06/21 1300  Temp 36.4 C 09/06/21 1300  Pulse 52 09/06/21 1345  Resp 15 09/06/21 1344  SpO2 100 % 09/06/21 1345  Vitals shown include unvalidated device data.  Last Pain:  Vitals:   09/06/21 1300  TempSrc:   PainSc: 5          Complications: No notable events documented.

## 2021-09-06 NOTE — Progress Notes (Signed)
Patient very agitated, combatitive, climbing out of bed, pulling at lines, foley, and SCDs. 3-4 staff members in room assisting patient back to bed to prevent complications of left hip surgery and to keep safe. MD contacted at [phone]. Awaiting call back and potential orders.

## 2021-09-06 NOTE — Anesthesia Procedure Notes (Signed)
Spinal  Patient location during procedure: OR Start time: 09/06/2021 10:39 AM End time: 09/06/2021 10:42 AM Reason for block: surgical anesthesia Staffing Performed: anesthesiologist  Anesthesiologist: Barnet Glasgow, MD Preanesthetic Checklist Completed: patient identified, IV checked, site marked, risks and benefits discussed, surgical consent, monitors and equipment checked, pre-op evaluation and timeout performed Spinal Block Patient position: sitting Prep: DuraPrep and site prepped and draped Patient monitoring: heart rate, cardiac monitor, continuous pulse ox and blood pressure Approach: midline Location: L3-4 Injection technique: single-shot Needle Needle type: Sprotte  Needle gauge: 24 G Needle length: 9 cm Needle insertion depth: 5 cm Assessment Sensory level: T4 Events: CSF return Additional Notes  1 Attempt (s). Pt tolerated procedure well.

## 2021-09-06 NOTE — Interval H&P Note (Signed)
History and Physical Interval Note:  09/06/2021 9:35 AM  Tiffany Wiggins  has presented today for surgery, with the diagnosis of left hip osteoarthritis.  The various methods of treatment have been discussed with the patient and family. After consideration of risks, benefits and other options for treatment, the patient has consented to  Procedure(s): TOTAL HIP ARTHROPLASTY ANTERIOR APPROACH (Left) as a surgical intervention.  The patient's history has been reviewed, patient examined, no change in status, stable for surgery.  I have reviewed the patient's chart and labs.  Questions were answered to the patient's satisfaction.     Pilar Plate Belvia Gotschall

## 2021-09-06 NOTE — Op Note (Signed)
OPERATIVE REPORT- TOTAL HIP ARTHROPLASTY   PREOPERATIVE DIAGNOSIS: Osteoarthritis of the Left hip.   POSTOPERATIVE DIAGNOSIS: Osteoarthritis of the Left  hip.   PROCEDURE: Left total hip arthroplasty, anterior approach.   SURGEON: [name], MD   ASSISTANT: Jaynie Bream, PA-C  ANESTHESIA:  Spinal  ESTIMATED BLOOD LOSS:- 150 ml  DRAINS: None  COMPLICATIONS: None   CONDITION: PACU - hemodynamically stable.   BRIEF CLINICAL NOTE: Tiffany Wiggins is a 76 y.o. female who has advanced end-  stage arthritis of their Left  hip with progressively worsening pain and  dysfunction.The patient has failed nonoperative management and presents for  total hip arthroplasty.   PROCEDURE IN DETAIL: After successful administration of spinal  anesthetic, the traction boots for the [facility] bed were placed on both  feet and the patient was placed onto the [facility] bed, boots placed into the leg  holders. The Left hip was then isolated from the perineum with plastic  drapes and prepped and draped in the usual sterile fashion. ASIS and  greater trochanter were marked and a oblique incision was made, starting  at about 1 cm lateral and 2 cm distal to the ASIS and coursing towards  the anterior cortex of the femur. The skin was cut with a 10 blade  through subcutaneous tissue to the level of the fascia overlying the  tensor fascia lata muscle. The fascia was then incised in line with the  incision at the junction of the anterior third and posterior 2/3rd. The  muscle was teased off the fascia and then the interval between the TFL  and the rectus was developed. The Hohmann retractor was then placed at  the top of the femoral neck over the capsule. The vessels overlying the  capsule were cauterized and the fat on top of the capsule was removed.  A Hohmann retractor was then placed anterior underneath the rectus  femoris to give exposure to the entire anterior capsule. A T-shaped  capsulotomy was  performed. The edges were tagged and the femoral head  was identified.       Osteophytes are removed off the superior acetabulum.  The femoral neck was then cut in situ with an oscillating saw. Traction  was then applied to the left lower extremity utilizing the [facility]  traction. The femoral head was then removed. Retractors were placed  around the acetabulum and then circumferential removal of the labrum was  performed. Osteophytes were also removed. Reaming starts at 47 mm to  medialize and  Increased in 2 mm increments to 49 mm. We reamed in  approximately 40 degrees of abduction, 20 degrees anteversion. A 50 mm  pinnacle acetabular shell was then impacted in anatomic position under  fluoroscopic guidance with excellent purchase. We did not need to place  any additional dome screws. A 32 mm neutral + 4 marathon liner was then  placed into the acetabular shell.       The femoral lift was then placed along the lateral aspect of the femur  just distal to the vastus ridge. The leg was  externally rotated and capsule  was stripped off the inferior aspect of the femoral neck down to the  level of the lesser trochanter, this was done with electrocautery. The femur was lifted after this was performed. The  leg was then placed in an extended and adducted position essentially delivering the femur. We also removed the capsule superiorly and the piriformis from the piriformis fossa to gain  excellent exposure of the  proximal femur. Rongeur was used to remove some cancellous bone to get  into the lateral portion of the proximal femur for placement of the  initial starter reamer. The starter broaches was placed  the starter broach  and was shown to go down the center of the canal. Broaching  with the Actis system was then performed starting at size 0  coursing  Up to size 5. A size 5 had excellent torsional and rotational  and axial stability. The trial standard offset neck was then placed  with a 32 + 1  trial head. The hip was then reduced. We confirmed that  the stem was in the canal both on AP and lateral x-rays. It also has excellent sizing. The hip was reduced with outstanding stability through full extension and full external rotation.. AP pelvis was taken and the leg lengths were measured and found to be equal. Hip was then dislocated again and the femoral head and neck removed. The  femoral broach was removed. Size 5 Actis stem with a standard offset  neck was then impacted into the femur following native anteversion. Has  excellent purchase in the canal. Excellent torsional and rotational and  axial stability. It is confirmed to be in the canal on AP and lateral  fluoroscopic views. The 32 + 1 ceramic head was placed and the hip  reduced with outstanding stability. Again AP pelvis was taken and it  confirmed that the leg lengths were equal. The wound was then copiously  irrigated with saline solution and the capsule reattached and repaired  with Ethibond suture. 30 ml of .25% Bupivicaine was  injected into the capsule and into the edge of the tensor fascia lata as well as subcutaneous tissue. The fascia overlying the tensor fascia lata was then closed with a running #1 V-Loc. Subcu was closed with interrupted 2-0 Vicryl and subcuticular running 4-0 Monocryl. Incision was cleaned  and dried. Steri-Strips and a bulky sterile dressing applied. The patient was awakened and transported to  recovery in stable condition.        Please note that a surgical assistant was a medical necessity for this procedure to perform it in a safe and expeditious manner. Assistant was necessary to provide appropriate retraction of vital neurovascular structures and to prevent femoral fracture and allow for anatomic placement of the prosthesis.  [name], M.D.

## 2021-09-06 NOTE — Anesthesia Postprocedure Evaluation (Signed)
Anesthesia Post Note  Patient: Tiffany Wiggins  Procedure(s) Performed: TOTAL HIP ARTHROPLASTY ANTERIOR APPROACH (Left: Hip)     Patient location during evaluation: Nursing Unit Anesthesia Type: Spinal Level of consciousness: oriented and awake and alert Pain management: pain level controlled Vital Signs Assessment: post-procedure vital signs reviewed and stable Respiratory status: spontaneous breathing and respiratory function stable Cardiovascular status: blood pressure returned to baseline and stable Postop Assessment: no headache, no backache, no apparent nausea or vomiting and patient able to bend at knees Anesthetic complications: no   No notable events documented.  Last Vitals:  Vitals:   09/06/21 1300 09/06/21 1400  BP: (!) 132/57 131/61  Pulse: (!) 54 (!) 52  Resp: 11 14  Temp: (!) 36.4 C 36.4 C  SpO2: 93% 97%    Last Pain:  Vitals:   09/06/21 1300  TempSrc:   PainSc: 5                  Barnet Glasgow

## 2021-09-07 ENCOUNTER — Other Ambulatory Visit: Payer: Self-pay

## 2021-09-07 ENCOUNTER — Encounter (HOSPITAL_COMMUNITY): Payer: Self-pay | Admitting: Orthopedic Surgery

## 2021-09-07 DIAGNOSIS — I429 Cardiomyopathy, unspecified: Secondary | ICD-10-CM | POA: Diagnosis not present

## 2021-09-07 DIAGNOSIS — I428 Other cardiomyopathies: Secondary | ICD-10-CM | POA: Diagnosis present

## 2021-09-07 DIAGNOSIS — N179 Acute kidney failure, unspecified: Secondary | ICD-10-CM | POA: Diagnosis not present

## 2021-09-07 DIAGNOSIS — M1610 Unilateral primary osteoarthritis, unspecified hip: Secondary | ICD-10-CM | POA: Diagnosis not present

## 2021-09-07 DIAGNOSIS — I6523 Occlusion and stenosis of bilateral carotid arteries: Secondary | ICD-10-CM | POA: Diagnosis present

## 2021-09-07 DIAGNOSIS — I471 Supraventricular tachycardia: Secondary | ICD-10-CM | POA: Diagnosis not present

## 2021-09-07 DIAGNOSIS — G301 Alzheimer's disease with late onset: Secondary | ICD-10-CM | POA: Diagnosis present

## 2021-09-07 DIAGNOSIS — R079 Chest pain, unspecified: Secondary | ICD-10-CM | POA: Diagnosis not present

## 2021-09-07 DIAGNOSIS — Z8711 Personal history of peptic ulcer disease: Secondary | ICD-10-CM | POA: Diagnosis not present

## 2021-09-07 DIAGNOSIS — E118 Type 2 diabetes mellitus with unspecified complications: Secondary | ICD-10-CM | POA: Diagnosis not present

## 2021-09-07 DIAGNOSIS — E559 Vitamin D deficiency, unspecified: Secondary | ICD-10-CM | POA: Diagnosis present

## 2021-09-07 DIAGNOSIS — F02B11 Dementia in other diseases classified elsewhere, moderate, with agitation: Secondary | ICD-10-CM | POA: Diagnosis present

## 2021-09-07 DIAGNOSIS — Z8673 Personal history of transient ischemic attack (TIA), and cerebral infarction without residual deficits: Secondary | ICD-10-CM | POA: Diagnosis not present

## 2021-09-07 DIAGNOSIS — E785 Hyperlipidemia, unspecified: Secondary | ICD-10-CM | POA: Diagnosis present

## 2021-09-07 DIAGNOSIS — Z96642 Presence of left artificial hip joint: Secondary | ICD-10-CM | POA: Diagnosis not present

## 2021-09-07 DIAGNOSIS — I872 Venous insufficiency (chronic) (peripheral): Secondary | ICD-10-CM | POA: Diagnosis present

## 2021-09-07 DIAGNOSIS — R778 Other specified abnormalities of plasma proteins: Secondary | ICD-10-CM | POA: Diagnosis not present

## 2021-09-07 DIAGNOSIS — I2510 Atherosclerotic heart disease of native coronary artery without angina pectoris: Secondary | ICD-10-CM | POA: Diagnosis present

## 2021-09-07 DIAGNOSIS — K219 Gastro-esophageal reflux disease without esophagitis: Secondary | ICD-10-CM | POA: Diagnosis present

## 2021-09-07 DIAGNOSIS — D72829 Elevated white blood cell count, unspecified: Secondary | ICD-10-CM | POA: Diagnosis not present

## 2021-09-07 DIAGNOSIS — E1122 Type 2 diabetes mellitus with diabetic chronic kidney disease: Secondary | ICD-10-CM | POA: Diagnosis present

## 2021-09-07 DIAGNOSIS — D539 Nutritional anemia, unspecified: Secondary | ICD-10-CM | POA: Diagnosis not present

## 2021-09-07 DIAGNOSIS — Z87891 Personal history of nicotine dependence: Secondary | ICD-10-CM | POA: Diagnosis not present

## 2021-09-07 DIAGNOSIS — F05 Delirium due to known physiological condition: Secondary | ICD-10-CM | POA: Diagnosis present

## 2021-09-07 DIAGNOSIS — I5021 Acute systolic (congestive) heart failure: Secondary | ICD-10-CM | POA: Diagnosis not present

## 2021-09-07 DIAGNOSIS — I130 Hypertensive heart and chronic kidney disease with heart failure and stage 1 through stage 4 chronic kidney disease, or unspecified chronic kidney disease: Secondary | ICD-10-CM | POA: Diagnosis present

## 2021-09-07 DIAGNOSIS — I480 Paroxysmal atrial fibrillation: Secondary | ICD-10-CM | POA: Diagnosis present

## 2021-09-07 DIAGNOSIS — I214 Non-ST elevation (NSTEMI) myocardial infarction: Secondary | ICD-10-CM | POA: Diagnosis not present

## 2021-09-07 DIAGNOSIS — M1612 Unilateral primary osteoarthritis, left hip: Secondary | ICD-10-CM | POA: Diagnosis present

## 2021-09-07 DIAGNOSIS — N1831 Chronic kidney disease, stage 3a: Secondary | ICD-10-CM | POA: Diagnosis present

## 2021-09-07 LAB — CBC
HCT: 35.7 % — ABNORMAL LOW (ref 36.0–46.0)
Hemoglobin: 11.3 g/dL — ABNORMAL LOW (ref 12.0–15.0)
MCH: 32.7 pg (ref 26.0–34.0)
MCHC: 31.7 g/dL (ref 30.0–36.0)
MCV: 103.2 fL — ABNORMAL HIGH (ref 80.0–100.0)
Platelets: 140 10*3/uL — ABNORMAL LOW (ref 150–400)
RBC: 3.46 MIL/uL — ABNORMAL LOW (ref 3.87–5.11)
RDW: 12.1 % (ref 11.5–15.5)
WBC: 18.1 10*3/uL — ABNORMAL HIGH (ref 4.0–10.5)
nRBC: 0 % (ref 0.0–0.2)

## 2021-09-07 LAB — BASIC METABOLIC PANEL
Anion gap: 10 (ref 5–15)
BUN: 32 mg/dL — ABNORMAL HIGH (ref 8–23)
CO2: 28 mmol/L (ref 22–32)
Calcium: 9.4 mg/dL (ref 8.9–10.3)
Chloride: 103 mmol/L (ref 98–111)
Creatinine, Ser: 1.55 mg/dL — ABNORMAL HIGH (ref 0.44–1.00)
GFR, Estimated: 35 mL/min — ABNORMAL LOW (ref >60)
Glucose, Bld: 153 mg/dL — ABNORMAL HIGH (ref 70–99)
Potassium: 4.7 mmol/L (ref 3.5–5.1)
Sodium: 141 mmol/L (ref 135–145)

## 2021-09-07 MED ORDER — NALOXONE HCL 0.4 MG/ML IJ SOLN
INTRAMUSCULAR | Status: AC
  Filled 2021-09-07: qty 1

## 2021-09-07 MED ORDER — LORAZEPAM 2 MG/ML IJ SOLN
0.2500 mg | Freq: Once | INTRAMUSCULAR | Status: AC
  Administered 2021-09-07: 0.25 mg via INTRAVENOUS
  Filled 2021-09-07: qty 1

## 2021-09-07 MED ORDER — LORAZEPAM 2 MG/ML IJ SOLN
0.2500 mg | Freq: Once | INTRAMUSCULAR | Status: AC
Start: 2021-09-07 — End: 2021-09-07
  Administered 2021-09-07: 0.25 mg via INTRAVENOUS
  Filled 2021-09-07: qty 1

## 2021-09-07 MED ORDER — NALOXONE HCL 0.4 MG/ML IJ SOLN
0.4000 mg | INTRAMUSCULAR | Status: DC | PRN
  Administered 2021-09-07: 0.2 mg via INTRAVENOUS

## 2021-09-07 MED ORDER — NALOXONE HCL 0.4 MG/ML IJ SOLN
0.4000 mg | INTRAMUSCULAR | Status: DC | PRN
Start: 2021-09-07 — End: 2021-09-12

## 2021-09-07 MED ORDER — APIXABAN 2.5 MG PO TABS
2.5000 mg | ORAL_TABLET | Freq: Two times a day (BID) | ORAL | Status: DC
  Administered 2021-09-07: 2.5 mg via ORAL
  Filled 2021-09-07: qty 1

## 2021-09-07 NOTE — Progress Notes (Signed)
50 Pt became very agitated, screaming, climbing out of bed and had removed dressing to L hip. 3 staff members in the room helped to get Pt back to bed, place a new dressing L hip and reapply restraints that had been off all throughout the shift. MD notified, new orders received. Husband at bedside.  Endorsed Pt accordingly to [place], Therapist, sports.

## 2021-09-07 NOTE — Progress Notes (Signed)
RN unable to arouse Pt with voice and groans with sternal rub, noted pupils to be pinpoint. [place] R.-PA, charge RN notified, rapid response called. PA updated, husband at bedside also updated.  14:00 Pt still sleepy but arousable, oriented to self only. VS monitored.

## 2021-09-07 NOTE — Progress Notes (Signed)
Physical Therapy Treatment Patient Details Name: Tiffany Wiggins MRN: [id number] DOB: 08-Jan-1946 Today's Date: 09/07/2021   History of Present Illness Pt is a 76yo female presenting s/p L-THA, AA on 09/06/21. PMH: hx of CVA, DM, HLD, HTN, IBS, hx of Tia, lumbar L4-L5 decompression, adominal exploratory laparotomy 2017, loop recorder implant 2015, PAF, dementia.    PT Comments    POD # 1 pm session Pt requesting to get back to bed.  Pt c/o her "buttocks" hurting sitting in recliner.  General Comments: AxO x 1 present with AMS post surgery (meds) following repeat commands, easily distracted and random words but overall pleasantly confused. Assisted out of recliner to [facility] then back to bed was difficult.  General transfer comment: Assisted from recliner to [facility] then to bed requiring 75% VC's on direction/instruction/hand placement and turn completion.  Pt able to self rise but required increased assist to complete 1/4 pivot to her RIGHT.  Also assist to control desend.  Pt was able to void BSC < 25 cc.  Max assist for peri care. General Gait Details: transfers only this session due to impaired cognition and safety. General bed mobility comments: Pt required Max Asisst back to bed mainly due to impaired cognition. Spouse present during session.  Educated cognition should improve with time. Pt plans to return home with Spouse.  Recommendations for follow up therapy are one component of a multi-disciplinary discharge planning process, led by the attending physician.  Recommendations may be updated based on patient status, additional functional criteria and insurance authorization.  Follow Up Recommendations  Follow physician's recommendations for discharge plan and follow up therapies     Assistance Recommended at Discharge Frequent or constant Supervision/Assistance  Patient can return home with the following A little help with walking and/or transfers;A little help with bathing/dressing/bathroom;Assistance  with cooking/housework;Assist for transportation;Help with stairs or ramp for entrance   Equipment Recommendations  None recommended by PT    Recommendations for Other Services       Precautions / Restrictions Precautions Precautions: Fall Precaution Comments: AMS (meds) Restrictions Weight Bearing Restrictions: No LLE Weight Bearing: Weight bearing as tolerated Other Position/Activity Restrictions: WBAT     Mobility  Bed Mobility Overal bed mobility: Needs Assistance Bed Mobility: Sit to Supine       Sit to supine: Max assist   General bed mobility comments: Pt required Max Asisst back to bed mainly due to impaired cognition.    Transfers Overall transfer level: Needs assistance Equipment used: 1 person hand held assist Transfers: Bed to chair/wheelchair/BSC Sit to Stand: Min assist, Mod assist Stand pivot transfers: Min assist, Mod assist         General transfer comment: Assisted from recliner to [facility] then to bed requiring 75% VC's on direction/instruction/hand placement and turn completion.  Pt able to self rise but required increased assist to complete 1/4 pivot to her RIGHT.  Also assist to control desend.  Pt was able to void BSC < 25 cc.  Max assist for peri care.    Ambulation/Gait               General Gait Details: transfers only this session due to impaired cognition and safety.   Stairs             Wheelchair Mobility    Modified Rankin (Stroke Patients Only)       Balance  Cognition Arousal/Alertness: Awake/alert                                     General Comments: AxO x 1 present with AMS post surgery (meds) following repeat commands, easily distracted and random words but overall pleasantly confused.        Exercises      General Comments General comments (skin integrity, edema, etc.): Husband [name] present for session      Pertinent  Vitals/Pain Pain Assessment Pain Assessment: Faces Faces Pain Scale: Hurts little more Pain Location: L hip and buttocks (sitting in recliner) Pain Descriptors / Indicators: Operative site guarding, Discomfort, Grimacing, Guarding Pain Intervention(s): Monitored during session, Repositioned, Ice applied    Home Living                          Prior Function            PT Goals (current goals can now be found in the care plan section) Acute Rehab PT Goals Patient Stated Goal: To walk without pain PT Goal Formulation: With patient Time For Goal Achievement: 09/13/21 Potential to Achieve Goals: Good Progress towards PT goals: Progressing toward goals    Frequency    7X/week      PT Plan Current plan remains appropriate    Co-evaluation              AM-PAC PT "6 Clicks" Mobility   Outcome Measure  Help needed turning from your back to your side while in a flat bed without using bedrails?: A Lot Help needed moving from lying on your back to sitting on the side of a flat bed without using bedrails?: A Lot Help needed moving to and from a bed to a chair (including a wheelchair)?: A Lot Help needed standing up from a chair using your arms (e.g., wheelchair or bedside chair)?: A Lot Help needed to walk in hospital room?: A Lot Help needed climbing 3-5 steps with a railing? : Total 6 Click Score: 11    End of Session Equipment Utilized During Treatment: Gait belt Activity Tolerance: Other (comment) (AMS) Patient left: in bed;with bed alarm set;with call bell/phone within reach;with family/visitor present;with SCD's reapplied;Other (comment) Chief Operating Officer) Nurse Communication: Mobility status PT Visit Diagnosis: Difficulty in walking, not elsewhere classified (R26.2);Pain Pain - Right/Left: Left Pain - part of body: Hip     Time: 0017-4944 PT Time Calculation (min) (ACUTE ONLY): 24 min  Charges:  $Gait Training: 8-22 mins $Therapeutic Activity: 23-37  mins                    Rica Koyanagi  PTA Acute  Rehabilitation Services Pager      [phone] Office      [phone]

## 2021-09-07 NOTE — Progress Notes (Signed)
PT Cancellation Note  Patient Details Name: Tiffany Wiggins MRN: [id number] DOB: [birth year]   Cancelled Treatment:    Reason Eval/Treat Not Completed: Medical issues which prohibited therapy (Pt became confused and agitated last night and was given ativan.) RN reporting low BP and requesting hold therapy. Will check back as pt status and schedule allow.   Coolidge Breeze, PT, DPT [place] [facility] Department Office: [phone] Pager: [phone]  Coolidge Breeze 09/07/2021, 10:27 AM

## 2021-09-07 NOTE — Significant Event (Signed)
Rapid Response Event Note   Reason for Call : Decreased level of consciousness, drowsy  Notified by charge RN that patient was hard to awaken. Patient did receive diluadid '1mg'$  at [id number] and 0.'25mg'$  of ativan was given around that time as well. Per bedside RN, patient did have some agitation overnight, trying to get out of bed as well. Upon arrival, patient was snoring, vital signs stable. Patient not able to be awaked by voice but sternal rub. After sternal rub applied, patient was able to open eyes briefly. Narcan 0.'4mg'$  was ordered per rapid response protocol.   Initial Focused Assessment:  Neuro: Drowsy, unable to follow commands at first, responded briefly to sternal rub, pupils 1+ bilaterally, equal round, reactive to light.  Cardiac: s1 and s2 heard upon auscultation, 1 + pulses felt upon palpation at radial and pedal locations bilaterally, BP WNL to hypertensive  Pulmonary: Breath sounds clear and diminished bilaterally in upper and lower lung fields, O2 Sats 94-98% on 2L [place].      Interventions:  0.'2mg'$  of Narcan given, patient become more awaken, less droswy, able to say first and last name, able to follow basic commands (squeeze hands, and wiggle toes bilaterally, strength weak bilaterally in all extremities), also able to state birth date as well.   Plan of Care:  Continue to monitor patient on [address], if patient becomes drowsy or lethargic please call Rapid Response. If patient has a change in mental status or hemodynamic status please call Rapid Response.  Rapid Response: [id number]  Event Summary:   MD Notified: Surgical PA Eustace Quail. Notified by bedside RN Call Time: Rapid Called at [place] Time: 3329 End Time: [place], RN

## 2021-09-07 NOTE — Progress Notes (Signed)
   Subjective: 1 Day Post-Op Procedure(s) (LRB): TOTAL HIP ARTHROPLASTY ANTERIOR APPROACH (Left) Patient seen in rounds by Dr. Wynelle Link. Patient became agitated and combative overnight. Confusion worsened from baseline. Patient reports pain as mild. Worked with physical therapy yesterday. Continue with physical therapy today.   Objective: Vital signs in last 24 hours: Temp:  [96.3 F (35.7 C)-98.5 F (36.9 C)] 98.5 F (36.9 C) (06/08 0526) Pulse Rate:  [46-62] 53 (06/08 0526) Resp:  [11-17] 17 (06/08 0526) BP: (105-148)/(47-99) 131/54 (06/08 0526) SpO2:  [78 %-100 %] 99 % (06/08 0805) Weight:  [74.3 kg-74.4 kg] 74.3 kg (06/07 1400)  Intake/Output from previous day:  Intake/Output Summary (Last 24 hours) at 09/07/2021 0832 Last data filed at 09/07/2021 0805 Gross per 24 hour  Intake 2009.68 ml  Output 1075 ml  Net 934.68 ml     Intake/Output this shift: Total I/O In: 215.1 [I.V.:215.1] Out: -   Labs: Recent Labs    09/07/21 0343  HGB 11.3*   Recent Labs    09/07/21 0343  WBC 18.1*  RBC 3.46*  HCT 35.7*  PLT 140*   Recent Labs    09/07/21 0343  NA 141  K 4.7  CL 103  CO2 28  BUN 32*  CREATININE 1.55*  GLUCOSE 153*  CALCIUM 9.4   No results for input(s): "LABPT", "INR" in the last 72 hours.  Exam: General - Patient is Confused Extremity - Neurologically intact Neurovascular intact Sensation intact distally Dorsiflexion/Plantar flexion intact Dressing - dressing C/D/I Motor Function - intact, moving foot and toes well on exam.  Past Medical History:  Diagnosis Date   Anxiety    Arthritis    CVA (cerebral vascular accident) ([place]) 2019   Diabetes mellitus type 2 in nonobese ([place])    Hyperlipidemia    Hypertension    PCP GAVE FOR HER  BLOOD PRESSURE   IBS (irritable bowel syndrome)    Memory deficit    PUD (peptic ulcer disease)    TIA (transient ischemic attack) 2006    Assessment/Plan: 1 Day Post-Op Procedure(s) (LRB): TOTAL HIP  ARTHROPLASTY ANTERIOR APPROACH (Left) Principal Problem:   Hip arthritis Active Problems:   Primary osteoarthritis of left hip  Estimated body mass index is 26.44 kg/m as calculated from the following:   Height as of this encounter: '5\' 6"'$  (1.676 m).   Weight as of this encounter: 74.3 kg. Advance diet Up with therapy D/C IV fluids  DVT Prophylaxis -  Eliquis Weight bearing as tolerated. Continue physical therapy.  Plan is to go Home after hospital stay.  Unfortunately Ms. Poland has had an increase in agitation and confusion since procedure. Was placed in 4 pt restraints overnight but removed this morning. Ordered and given ativan this AM. Suspect she may rest most of the day but if able, will proceed with physical therapy. Continue to monitor.  R. Jaynie Bream, PA-C Orthopedic Surgery [phone] 09/07/2021, 8:32 AM

## 2021-09-07 NOTE — Progress Notes (Signed)
Physical Therapy Treatment Patient Details Name: Tiffany Wiggins MRN: [id number] DOB: 06/05/1945 Today's Date: 09/07/2021   History of Present Illness Pt is a 76yo female presenting s/p L-THA, AA on 09/06/21. PMH: hx of CVA, DM, HLD, HTN, IBS, hx of Tia, lumbar L4-L5 decompression, adominal exploratory laparotomy 2017, loop recorder implant 2015, PAF, dementia.    PT Comments    OF NOTE: pt had rapid response today approximately 11am and was administered Narcan. Monitored vitals and cognition closely throughout session.  Upon entry, pt able to name self, birthday, and husband but not location or president. Pt speaking in sentences that are sometimes garbled and nonsensical, but responds to most questions appropriately. Pt able to perform ankle DF/PF actively with good range of motion. Pt requests to use restroom, performed bed mobility with min assist, transfer with min assist and upon standing reporting dizziness. BP taken 144/50, dizziness cleared with seated rest break EOB ~82mn. Pt ambulated to restroom with min assist +2 and recliner follow for safety. Pt ended session in recliner with feet up and making jokes, per husband this is normal behavior. We will continue to follow her acutely.     Recommendations for follow up therapy are one component of a multi-disciplinary discharge planning process, led by the attending physician.  Recommendations may be updated based on patient status, additional functional criteria and insurance authorization.  Follow Up Recommendations  Follow physician's recommendations for discharge plan and follow up therapies     Assistance Recommended at Discharge Frequent or constant Supervision/Assistance  Patient can return home with the following A little help with walking and/or transfers;A little help with bathing/dressing/bathroom;Assistance with cooking/housework;Assist for transportation;Help with stairs or ramp for entrance   Equipment Recommendations  None  recommended by PT    Recommendations for Other Services       Precautions / Restrictions Precautions Precautions: Fall Precaution Comments: Pt had rapid response at 11am 6/8. Restrictions Weight Bearing Restrictions: Yes LLE Weight Bearing: Weight bearing as tolerated Other Position/Activity Restrictions: WBAT     Mobility  Bed Mobility Overal bed mobility: Needs Assistance Bed Mobility: Supine to Sit     Supine to sit: Min assist     General bed mobility comments: Pt required min assist to bring LLE off bed, scooting hips EOB.    Transfers Overall transfer level: Needs assistance Equipment used: Rolling walker (2 wheels) Transfers: Sit to/from Stand Sit to Stand: From elevated surface, Min assist           General transfer comment: Sit to stand: Pt min assist from elevated surface with multimodal cuing for sequencing, pt reporting dizziness vitals 144/50.    Ambulation/Gait Ambulation/Gait assistance: Min assist, +2 safety/equipment, +2 physical assistance Gait Distance (Feet): 15 Feet Assistive device: Rolling walker (2 wheels) Gait Pattern/deviations: Step-to pattern, Drifts right/left, Trunk flexed       General Gait Details: Pt ambulated with RW and min assist for walker management, +2 for recliner follow and IV pole management and safety, no overt LOB noted. Pt ambulated from EOB to commode and completed sit to stand transfer x2 with min assist and max multimodal cuing for use of grab bar and walker to lower down and stand up, min assist for steadying of RW and lift assist. Ambulated to chair immediately outsdie of door, further mobility deferred. Pt able to follow one-step commands consistently with multimodal cuing, VSS.   Stairs             Wheelchair Mobility    Modified  Rankin (Stroke Patients Only)       Balance Overall balance assessment: Needs assistance Sitting-balance support: Feet supported, No upper extremity supported Sitting  balance-Leahy Scale: Fair     Standing balance support: Reliant on assistive device for balance, During functional activity, Bilateral upper extremity supported Standing balance-Leahy Scale: Poor                              Cognition Arousal/Alertness: Lethargic, Suspect due to medications Behavior During Therapy: WFL for tasks assessed/performed, Flat affect Overall Cognitive Status: History of cognitive impairments - at baseline                                 General Comments: Pt slurring speech, some speech unintelligible, nonsensicle.        Exercises      General Comments General comments (skin integrity, edema, etc.): Husband Tiffany Wiggins present for session      Pertinent Vitals/Pain Pain Assessment Pain Assessment: No/denies pain Breathing: normal Negative Vocalization: occasional moan/groan, low speech, negative/disapproving quality Facial Expression: sad, frightened, frown Body Language: tense, distressed pacing, fidgeting Consolability: no need to console PAINAD Score: 3 Pain Location: left hip Pain Descriptors / Indicators: Operative site guarding, Discomfort Pain Intervention(s): Limited activity within patient's tolerance, Monitored during session, Repositioned, Ice applied    Home Living                          Prior Function            PT Goals (current goals can now be found in the care plan section) Acute Rehab PT Goals Patient Stated Goal: To walk without pain PT Goal Formulation: With patient Time For Goal Achievement: 09/13/21 Potential to Achieve Goals: Good Progress towards PT goals: Progressing toward goals    Frequency    7X/week      PT Plan Current plan remains appropriate    Co-evaluation              AM-PAC PT "6 Clicks" Mobility   Outcome Measure  Help needed turning from your back to your side while in a flat bed without using bedrails?: A Little Help needed moving from lying on  your back to sitting on the side of a flat bed without using bedrails?: A Little Help needed moving to and from a bed to a chair (including a wheelchair)?: A Little Help needed standing up from a chair using your arms (e.g., wheelchair or bedside chair)?: A Little Help needed to walk in hospital room?: A Lot Help needed climbing 3-5 steps with a railing? : A Lot 6 Click Score: 16    End of Session Equipment Utilized During Treatment: Gait belt Activity Tolerance: Patient limited by fatigue Patient left: in chair;with call bell/phone within reach;with chair alarm set;with family/visitor present;with SCD's reapplied Nurse Communication: Mobility status;Other (comment) (Cognitive status) PT Visit Diagnosis: Difficulty in walking, not elsewhere classified (R26.2);Pain Pain - Right/Left: Left Pain - part of body: Hip     Time: 4076-8088 PT Time Calculation (min) (ACUTE ONLY): 37 min  Charges:  $Gait Training: 8-22 mins $Therapeutic Activity: 8-22 mins                    Coolidge Breeze, PT, DPT [place] [facility] Department Office: [phone] Pager: [phone]   Coolidge Breeze 09/07/2021, 5:13 PM

## 2021-09-07 NOTE — Plan of Care (Signed)

## 2021-09-07 NOTE — TOC Transition Note (Signed)
Transition of Care [facility]) - CM/SW Discharge Note  Patient Details  Name: Tiffany Wiggins MRN: [id number] Date of Birth: 01-24-1946  [facility] CM/SW Contact:  Sherie Don, LCSW Phone Number: 09/07/2021, 9:53 AM  Clinical Narrative: Patient is expected to discharge home after working with PT. [place] met with patient's husband, Tiffany Wiggins, as patient was sleeping. Patient will go home with a home exercise program (HEP). Patient has a cane and rolling walker with wheels that swivel at home, which she private paid for, but will need a regular rolling walker. [employer] delivered rolling walker to patient's room. TOC signing off.  Final next level of care: Home/Self Care Barriers to Discharge: No Barriers Identified  Patient Goals and CMS Choice Patient states their goals for this hospitalization and ongoing recovery are:: Discharge home with HEP CMS Medicare.gov Compare Post Acute Care list provided to:: Patient Represenative (must comment) Choice offered to / list presented to : Spouse  Discharge Plan and Services         DME Arranged: Walker rolling DME Agency: [employer] Date DME Agency Contacted: 09/07/21 Representative spoke with at DME Agency: Wells Guiles  Readmission Risk Interventions     No data to display

## 2021-09-07 NOTE — Plan of Care (Signed)
  Problem: Pain Management: Goal: Pain level will decrease with appropriate interventions 09/07/2021 2026 by Abagail Kitchens, RN Outcome: Progressing 09/07/2021 2026 by Abagail Kitchens, RN Outcome: Progressing   Problem: Safety: Goal: Non-violent Restraint(s) 09/07/2021 2026 by Abagail Kitchens, RN Outcome: Progressing 09/07/2021 2026 by Abagail Kitchens, RN Outcome: Progressing   Problem: Clinical Measurements: Goal: Ability to maintain clinical measurements within normal limits will improve Outcome: Progressing   Problem: Skin Integrity: Goal: Risk for impaired skin integrity will decrease Outcome: Progressing

## 2021-09-08 ENCOUNTER — Other Ambulatory Visit: Payer: Self-pay

## 2021-09-08 LAB — CBC
HCT: 32.9 % — ABNORMAL LOW (ref 36.0–46.0)
Hemoglobin: 10.6 g/dL — ABNORMAL LOW (ref 12.0–15.0)
MCH: 32.7 pg (ref 26.0–34.0)
MCHC: 32.2 g/dL (ref 30.0–36.0)
MCV: 101.5 fL — ABNORMAL HIGH (ref 80.0–100.0)
Platelets: 164 10*3/uL (ref 150–400)
RBC: 3.24 MIL/uL — ABNORMAL LOW (ref 3.87–5.11)
RDW: 12.2 % (ref 11.5–15.5)
WBC: 18.9 10*3/uL — ABNORMAL HIGH (ref 4.0–10.5)
nRBC: 0 % (ref 0.0–0.2)

## 2021-09-08 MED ORDER — APIXABAN 2.5 MG PO TABS
2.5000 mg | ORAL_TABLET | Freq: Two times a day (BID) | ORAL | Status: AC
  Administered 2021-09-08 (×2): 2.5 mg via ORAL
  Filled 2021-09-08 (×2): qty 1

## 2021-09-08 MED ORDER — NITROGLYCERIN 0.4 MG SL SUBL
SUBLINGUAL_TABLET | SUBLINGUAL | Status: AC
  Filled 2021-09-08: qty 1

## 2021-09-08 MED ORDER — METHOCARBAMOL 500 MG PO TABS: 500.0000 mg | ORAL_TABLET | Freq: Four times a day (QID) | ORAL | 0 refills | Status: DC | PRN

## 2021-09-08 MED ORDER — APIXABAN 5 MG PO TABS: 5.0000 mg | ORAL_TABLET | Freq: Two times a day (BID) | ORAL | Status: DC

## 2021-09-08 NOTE — Progress Notes (Signed)
7am-7pm:  Throughout this shift patient has exhibited confusion and agitation. Patient inconsistently followed commands. Periods of napping followed by periods of hyperactivity. Restraints and 1:1 sitter in place for safety. Pills were administered crushed in apple sauce this morning; patient chewed colace. Patient total care for turning and peri care. Patient noted to retain urine this shift. Bladder scan done by aide and patient retaining 859m at noon. 6511mwas evacuated via I&O cath in addition to incontinent amount that could not be measured. Patient complained of a headache after I&O but was observed sleeping and calm shortly after. Dr. AlMaureen Ralphsotified.  Several times today when patient resting, husband asked if patient could be given medications to prevent agitated behaviors. Education and reinforcement that goal is not to sedate patient at this time through the use of narcotics/benzodiazepines. Husband reports that patient sometimes gives patient benzodiazepines to help her relax at home.  Notified Rapid Response nurse this evening regarding patient's course and also family concerns of pallor. When Rapid Response nurse arrived, he spoke with husband and they both agreed that patient was looking better and sounding better than previous evening when rapid response was called. Rapid response nurse made recommendations made for Haloperidol as an alternative to benzodiazepines if needed. This RN reported to onCommunity education officer At shift change, patient is observed sitting up in bed smiling and engaging with her husband and a staff member. She is observed calm and the most lucid she had been throughout the day.  AnIvan AnchorsRN 09/08/21  8:16 PM

## 2021-09-08 NOTE — Progress Notes (Signed)
   Subjective: 2 Days Post-Op Procedure(s) (LRB): TOTAL HIP ARTHROPLASTY ANTERIOR APPROACH (Left)  Patient seen in rounds by Dr. Wynelle Link. Patient with continued agitation and confusion last night, requiring restraints. This morning she is still confused, though less agitated than yesterday morning. All narcotic medication has been discontinued.   Objective: Vital signs in last 24 hours: Temp:  [97.9 F (36.6 C)-98.2 F (36.8 C)] 97.9 F (36.6 C) (06/08 2001) Pulse Rate:  [50-114] 108 (06/09 0535) Resp:  [14-17] 17 (06/09 0535) BP: (104-152)/(40-79) 152/76 (06/09 0535) SpO2:  [83 %-100 %] 100 % (06/09 0535)  Intake/Output from previous day:  Intake/Output Summary (Last 24 hours) at 09/08/2021 0703 Last data filed at 09/07/2021 1832 Gross per 24 hour  Intake 1081.11 ml  Output --  Net 1081.11 ml    Intake/Output this shift: No intake/output data recorded.  Labs: Recent Labs    09/07/21 0343 09/08/21 0338  HGB 11.3* 10.6*   Recent Labs    09/07/21 0343 09/08/21 0338  WBC 18.1* 18.9*  RBC 3.46* 3.24*  HCT 35.7* 32.9*  PLT 140* 164   Recent Labs    09/07/21 0343  NA 141  K 4.7  CL 103  CO2 28  BUN 32*  CREATININE 1.55*  GLUCOSE 153*  CALCIUM 9.4   No results for input(s): "LABPT", "INR" in the last 72 hours.  Exam: General - Patient is Alert Extremity - Neurologically intact Neurovascular intact Sensation intact distally Dorsiflexion/Plantar flexion intact Dressing/Incision - clean, dry, no drainage Motor Function - intact, moving foot and toes well on exam.   Past Medical History:  Diagnosis Date   Anxiety    Arthritis    CVA (cerebral vascular accident) ([place]) 2019   Diabetes mellitus type 2 in nonobese ([place])    Hyperlipidemia    Hypertension    PCP GAVE FOR HER  BLOOD PRESSURE   IBS (irritable bowel syndrome)    Memory deficit    PUD (peptic ulcer disease)    TIA (transient ischemic attack) 2006    Assessment/Plan: 2 Days Post-Op  Procedure(s) (LRB): TOTAL HIP ARTHROPLASTY ANTERIOR APPROACH (Left) Principal Problem:   Hip arthritis Active Problems:   Primary osteoarthritis of left hip  Estimated body mass index is 26.44 kg/m as calculated from the following:   Height as of this encounter: '5\' 6"'$  (1.676 m).   Weight as of this encounter: 74.3 kg. Up with therapy  DVT Prophylaxis -  Eliquis Weight-bearing as tolerated  Work with PT today if able, will stay until she cognitively progresses.  Theresa Duty, PA-C Orthopedic Surgery [phone] 09/08/2021, 7:03 AM

## 2021-09-08 NOTE — Progress Notes (Signed)
PT Cancellation Note  Patient Details Name: Tiffany Wiggins MRN: [id number] DOB: 11/14/1945   Cancelled Treatment:    Reason Eval/Treat Not Completed: (P) Medical issues which prohibited therapy. Pt had another episode of agitation and confusion last night that required restraints and a sitter. Per RN pt is not following commands and is struggling against restraints. Will hold PT for today and will follow up as schedule and pt status allows which may be another day.   Coolidge Breeze, PT, DPT [facility] Department Office: [phone] Pager: [phone]  Coolidge Breeze 09/08/2021, 9:41 AM

## 2021-09-08 NOTE — Plan of Care (Signed)
7am-7pm shift:  Problem: Activity: Goal: Ability to avoid complications of mobility impairment will improve Outcome: Not Progressing   Problem: Safety: Goal: Non-violent Restraint(s) Outcome: Not Progressing   Problem: Clinical Measurements: Goal: Ability to maintain clinical measurements within normal limits will improve Outcome: [place], RN 09/08/21 7:55 PM

## 2021-09-08 NOTE — Progress Notes (Signed)
Pt continues to be very agitated and combative, pulling on lines, swing at staff and climbing out of bed frequently, needed four staff to assist back to bed. Notified the on call provider. New orders received for restraints and safety observation 1:1. Will continue to monitor pt.

## 2021-09-09 ENCOUNTER — Inpatient Hospital Stay (HOSPITAL_COMMUNITY): Payer: Medicare Other

## 2021-09-09 ENCOUNTER — Other Ambulatory Visit: Payer: Self-pay

## 2021-09-09 DIAGNOSIS — R778 Other specified abnormalities of plasma proteins: Secondary | ICD-10-CM

## 2021-09-09 DIAGNOSIS — M1610 Unilateral primary osteoarthritis, unspecified hip: Secondary | ICD-10-CM | POA: Diagnosis not present

## 2021-09-09 DIAGNOSIS — E118 Type 2 diabetes mellitus with unspecified complications: Secondary | ICD-10-CM | POA: Diagnosis not present

## 2021-09-09 DIAGNOSIS — Z96642 Presence of left artificial hip joint: Secondary | ICD-10-CM

## 2021-09-09 DIAGNOSIS — G301 Alzheimer's disease with late onset: Secondary | ICD-10-CM | POA: Diagnosis not present

## 2021-09-09 DIAGNOSIS — N1831 Chronic kidney disease, stage 3a: Secondary | ICD-10-CM

## 2021-09-09 DIAGNOSIS — I214 Non-ST elevation (NSTEMI) myocardial infarction: Secondary | ICD-10-CM

## 2021-09-09 DIAGNOSIS — R079 Chest pain, unspecified: Secondary | ICD-10-CM | POA: Diagnosis not present

## 2021-09-09 DIAGNOSIS — F02B11 Dementia in other diseases classified elsewhere, moderate, with agitation: Secondary | ICD-10-CM

## 2021-09-09 DIAGNOSIS — I480 Paroxysmal atrial fibrillation: Secondary | ICD-10-CM

## 2021-09-09 LAB — ECHOCARDIOGRAM COMPLETE
AR max vel: 2.14 cm2
AV Peak grad: 7.6 mmHg
Ao pk vel: 1.38 m/s
Area-P 1/2: 3.37 cm2
Height: 66 in
S' Lateral: 4.1 cm
Single Plane A4C EF: 31.2 %
Weight: 2620.83 oz

## 2021-09-09 LAB — BASIC METABOLIC PANEL
Anion gap: 9 (ref 5–15)
BUN: 34 mg/dL — ABNORMAL HIGH (ref 8–23)
CO2: 27 mmol/L (ref 22–32)
Calcium: 9.4 mg/dL (ref 8.9–10.3)
Chloride: 107 mmol/L (ref 98–111)
Creatinine, Ser: 1.39 mg/dL — ABNORMAL HIGH (ref 0.44–1.00)
GFR, Estimated: 40 mL/min — ABNORMAL LOW (ref >60)
Glucose, Bld: 185 mg/dL — ABNORMAL HIGH (ref 70–99)
Potassium: 3.8 mmol/L (ref 3.5–5.1)
Sodium: 143 mmol/L (ref 135–145)

## 2021-09-09 LAB — LIPID PANEL
Cholesterol: 159 mg/dL (ref 0–200)
HDL: 81 mg/dL (ref >40)
LDL Cholesterol: 64 mg/dL (ref 0–99)
Total CHOL/HDL Ratio: 2 RATIO
Triglycerides: 71 mg/dL (ref <150)
VLDL: 14 mg/dL (ref 0–40)

## 2021-09-09 LAB — APTT
aPTT: 33 seconds (ref 24–36)
aPTT: 78 seconds — ABNORMAL HIGH (ref 24–36)

## 2021-09-09 LAB — CK TOTAL AND CKMB (NOT AT ARMC)
CK, MB: 193.5 ng/mL — ABNORMAL HIGH (ref 0.5–5.0)
Relative Index: 7.1 — ABNORMAL HIGH (ref 0.0–2.5)
Total CK: 2738 U/L — ABNORMAL HIGH (ref 38–234)

## 2021-09-09 LAB — TROPONIN I (HIGH SENSITIVITY)
Troponin I (High Sensitivity): >24000 ng/L (ref <18)
Troponin I (High Sensitivity): >24000 ng/L (ref <18)

## 2021-09-09 LAB — HEPARIN LEVEL (UNFRACTIONATED): Heparin Unfractionated: >1.1 IU/mL — ABNORMAL HIGH (ref 0.30–0.70)

## 2021-09-09 LAB — GLUCOSE, CAPILLARY: Glucose-Capillary: 207 mg/dL — ABNORMAL HIGH (ref 70–99)

## 2021-09-09 MED ORDER — HEPARIN (PORCINE) 25000 UT/250ML-% IV SOLN
1000.0000 [IU]/h | INTRAVENOUS | Status: DC
Start: 2021-09-09 — End: 2021-09-11
  Administered 2021-09-09 – 2021-09-10 (×2): 900 [IU]/h via INTRAVENOUS
  Administered 2021-09-11: 1000 [IU]/h via INTRAVENOUS
  Filled 2021-09-09 (×4): qty 250

## 2021-09-09 MED ORDER — ROSUVASTATIN CALCIUM 20 MG PO TABS
20.0000 mg | ORAL_TABLET | Freq: Every day | ORAL | Status: DC
  Administered 2021-09-10 – 2021-09-12 (×3): 20 mg via ORAL
  Filled 2021-09-09 (×3): qty 1

## 2021-09-09 MED ORDER — MELATONIN 5 MG PO TABS
5.0000 mg | ORAL_TABLET | Freq: Every evening | ORAL | Status: DC | PRN
  Administered 2021-09-09: 5 mg via ORAL
  Filled 2021-09-09: qty 1

## 2021-09-09 MED ORDER — ASPIRIN 325 MG PO TABS
325.0000 mg | ORAL_TABLET | Freq: Every day | ORAL | Status: DC
  Administered 2021-09-09: 325 mg via ORAL
  Filled 2021-09-09: qty 1

## 2021-09-09 MED ORDER — INSULIN ASPART 100 UNIT/ML IJ SOLN
0.0000 [IU] | Freq: Three times a day (TID) | INTRAMUSCULAR | Status: DC
  Administered 2021-09-09: 5 [IU] via SUBCUTANEOUS
  Administered 2021-09-10: 2 [IU] via SUBCUTANEOUS
  Administered 2021-09-10: 3 [IU] via SUBCUTANEOUS
  Administered 2021-09-12: 2 [IU] via SUBCUTANEOUS

## 2021-09-09 MED ORDER — PERFLUTREN LIPID MICROSPHERE
1.0000 mL | INTRAVENOUS | Status: AC | PRN
  Administered 2021-09-09: 4 mL via INTRAVENOUS

## 2021-09-09 MED ORDER — ASPIRIN 81 MG PO TBEC
81.0000 mg | DELAYED_RELEASE_TABLET | Freq: Every day | ORAL | Status: DC
  Administered 2021-09-10: 81 mg via ORAL
  Filled 2021-09-09 (×2): qty 1

## 2021-09-09 NOTE — Progress Notes (Signed)
I was called by nursing regarding Tiffany Wiggins complaining of chest pain. Rapid response had evaluated her and she had stable vitals and Oxygen sats of 97% on 2L. She had nitroglycerin administered x 1. I spoke with Janelle from rapid response. There was a discrepancy on EKG reads with one stating normal EKG and the other stating ST elevations in one lead.It is difficult to get an accurate history from the patient as she has been confused throughout the hospital stay.Given the complaints of chest pain coupled with her cardiac history it is best to evaluate this further. We are going to obtain troponin levels and transfer to telemetry for closer monitoring.Consult to hospitalist service has also been initiated.

## 2021-09-09 NOTE — Progress Notes (Signed)
Received pt from 3W. Agree with previous assessment. Pt placed on telemetry, EKG completed, started on heparin gtt, and given dose aspirin ordered. Pt needs addressed. Call light within reach, bed alarm on. Telesitter active.

## 2021-09-09 NOTE — Assessment & Plan Note (Addendum)
Baseline creatinine of 1.1-1.2.

## 2021-09-09 NOTE — Progress Notes (Signed)
ANTICOAGULATION CONSULT NOTE - Initial Consult  Pharmacy Consult for Heparin (on apixaban PTA) Indication: chest pain/ACS, h/o AFib  Allergies  Allergen Reactions   Codeine Nausea Only   Morphine And Related Itching    Patient Measurements: Height: '5\' 6"'$  (167.6 cm) Weight: 74.3 kg (163 lb 12.8 oz) IBW/kg (Calculated) : 59.3 Heparin Dosing Weight: 74 kg  Vital Signs: Temp: 98.8 F (37.1 C) (06/10 0209) Temp Source: Oral (06/09 2350) BP: 133/59 (06/10 0209) Pulse Rate: 57 (06/10 0209)  Labs: Recent Labs    09/07/21 0343 09/08/21 0338 09/09/21 0041  HGB 11.3* 10.6*  --   HCT 35.7* 32.9*  --   PLT 140* 164  --   CREATININE 1.55*  --  1.39*  TROPONINIHS  --   --  >24,000*    Estimated Creatinine Clearance: 36.6 mL/min (A) (by C-G formula based on SCr of 1.39 mg/dL (H)).   Medical History: Past Medical History:  Diagnosis Date   Anxiety    Arthritis    CVA (cerebral vascular accident) ([place]) 2019   Diabetes mellitus type 2 in nonobese ([place])    Hyperlipidemia    Hypertension    PCP GAVE FOR HER  BLOOD PRESSURE   IBS (irritable bowel syndrome)    Memory deficit    PUD (peptic ulcer disease)    TIA (transient ischemic attack) 2006    Medications:  PTA on Apixaban '5mg'$  po BID S/p left THA on 6/7 - on Apixaban 2.'5mg'$  BID 6/8-6/9  Assessment: 76 yr female admitted for left THA.  PMH significant for AFib, HTN, dementia, DM Underwent left THA on 09/06/2021.  On 6/8 apixaban resumed post op at 2.'5mg'$  BID with plans to increase to PTA dose of '5mg'$  BID on 6/10 Last dose of Apixaban 2.'5mg'$  taken 6/9 @ 21:09 Tonight developed chest pain.  Troponin > 24,000  Goal of Therapy:  Heparin level 0.3-0.7 units/ml aPTT 66-102 seconds Monitor platelets by anticoagulation protocol: Yes   Plan:  Obtain baseline aPTT and heparin level Will not give a heparin bolus due to Apixaban 2.'5mg'$  given 6/9 @ 21:09 Begin IV heparin gtt @ 900 units/hr Check aPTT/HL 8 hr after heparin  started Monitor heparin level with aPTT until aPTT and HL correlate and then will monitor therapy with HL Daily HL & CBC Monitor for symptoms of bleeding  Divonte Senger, Toribio Harbour, PharmD 09/09/2021,2:20 AM

## 2021-09-09 NOTE — Progress Notes (Signed)
ANTICOAGULATION CONSULT NOTE - Follow Up Consult  Pharmacy Consult for Heparin Indication: chest pain/ACS, h/o afib  Allergies  Allergen Reactions   Codeine Nausea Only   Morphine And Related Itching    Patient Measurements: Height: '5\' 6"'$  (167.6 cm) Weight: 74.3 kg (163 lb 12.8 oz) IBW/kg (Calculated) : 59.3 Heparin Dosing Weight:  74 kg  Vital Signs: Temp: 98.3 F (36.8 C) (06/10 1209) Temp Source: Oral (06/10 1209) BP: 97/73 (06/10 1209) Pulse Rate: 85 (06/10 1209)  Labs: Recent Labs    09/07/21 0343 09/08/21 0338 09/09/21 0041 09/09/21 0042 09/09/21 0318 09/09/21 1120  HGB 11.3* 10.6*  --   --   --   --   HCT 35.7* 32.9*  --   --   --   --   PLT 140* 164  --   --   --   --   APTT  --   --  33  --   --  78*  HEPARINUNFRC  --   --   --  >1.10*  --   --   CREATININE 1.55*  --  1.39*  --   --   --   CKTOTAL  --   --   --   --  2,738*  --   CKMB  --   --   --   --  193.5*  --   TROPONINIHS  --   --  >24,000*  --  >24,000*  --     Estimated Creatinine Clearance: 36.6 mL/min (A) (by C-G formula based on SCr of 1.39 mg/dL (H)).   Assessment: AC/Heme: +FESO4. Eliquis as PTA. 6/9 PM developed CP and +troponin. Eliquis (LD 6/9, 2100)>> 6/10 IV heparin  - Hgb 12.7>10.6 post-op, Plts 164 ok. aPTT 78 in goal.  Goal of Therapy:  aPTT 66-102 seconds Monitor platelets by anticoagulation protocol: Yes   Plan:  D/c Eliquis (LD 6/9, 2100) IV heparin 900 units/hr Daily HL and CBC   Tiffany Wiggins, PharmD, BCPS Clinical Staff Pharmacist [email or web address]  Alford Wiggins, Tiffany Wiggins 09/09/2021,1:00 PM

## 2021-09-09 NOTE — Assessment & Plan Note (Signed)
Management per ortho.

## 2021-09-09 NOTE — Progress Notes (Signed)
Tiffany Wiggins  MRN: [id number] DOB/Age: 1946/01/08 76 y.o. [facility] Procedure: Procedure(s) (LRB): TOTAL HIP ARTHROPLASTY ANTERIOR APPROACH (Left)     Subjective: Awakened this am, appears to be alert and oriented however didn't remember that her husband had spent the night according to staff. He had just gone home this am. Ongoing events noted with plan for transfer to [facility] for ongoing cardiac workup  Vital Signs Temp:  [97.7 F (36.5 C)-98.8 F (37.1 C)] 98.8 F (37.1 C) (06/10 0209) Pulse Rate:  [57-102] 57 (06/10 0209) Resp:  [17-22] 22 (06/10 0209) BP: (116-134)/(59-95) 133/59 (06/10 0209) SpO2:  [93 %-99 %] 99 % (06/10 0209)  Lab Results Recent Labs    09/07/21 0343 09/08/21 0338  WBC 18.1* 18.9*  HGB 11.3* 10.6*  HCT 35.7* 32.9*  PLT 140* 164   BMET Recent Labs    09/07/21 0343 09/09/21 0041  NA 141 143  K 4.7 3.8  CL 103 107  CO2 28 27  GLUCOSE 153* 185*  BUN 32* 34*  CREATININE 1.55* 1.39*  CALCIUM 9.4 9.4   No results found for: "INR"   Exam Left hip dressing dry NVI        Plan Ongoing care per hospitalist and cardiology When medically stable will allow to mobilize but current bedrest, etc until cleared  Jenetta Loges PA-C  09/09/2021, 9:43 AM Contact # [phone]

## 2021-09-09 NOTE — Assessment & Plan Note (Signed)
Unlikely coronary related. Normal stress test last night. EKG without any ST changes. I disagree with computer interpretation that states ST elevation. Pt already on Eliquis bid. Trend serial troponin. Will moved to telemetry bed overnight.

## 2021-09-09 NOTE — Assessment & Plan Note (Addendum)
Patient is s/p left total hip arthroplasty on 6/7. -Orthopedic surgery recommendations: WBAT from orthopedic standpoint -PT/OT

## 2021-09-09 NOTE — Progress Notes (Signed)
Rapid Response Event Note   Reason for Call : pt c/o chest pain.   Initial Focused Assessment: Pt alert and oriented x 1.  Follows commands. Breath sounds decreased.  Pulses intact. VSS see flow sheet. Pt rates Chest Pain 9/10.  Unable to answer in depth questions involving chest pain. Pt husband at bedside and attempting to provide information concerning pt care.  Interventions: Nitro SL x 1.  Pt then sleeping.  Spoke with Wynelle Link, MD and Bridgett Larsson, MD  at bedside to evaluate pt.) New orders received and initiated. Pt placed on 2 L [place].  Plan of Care: Transfer to Telemetry, for closer monitoring.    Event Summary:   MD Notified: yes Call Time: 2316 Arrival Time: 2324 End Time: 0100  Dyann Ruddle, RN

## 2021-09-09 NOTE — Assessment & Plan Note (Deleted)
Patient is s/p left total hip arthroplasty on 6/7. -Orthopedic surgery recommendations: WBAT from orthopedic standpoint -PT/OT

## 2021-09-09 NOTE — Consult Note (Signed)
Hospitalist Consultation History and Physical    Tiffany Wiggins:[id number] DOB: 12-15-1945 DOA: 09/06/2021  DOS: the patient was seen and examined on 09/06/2021  PCP: Raina Mina., MD   I have personally briefly reviewed patient's old medical records in [facility]  Reason for consult: Chest pain Requesting provider Dr. Wynelle Link History present illness: 76 year old female history of paroxysmal atrial fibrillation, hypertension, chronic anticoagulation for A-fib, moderate dementia, type 2 diabetes (diet-controlled) who was admitted to hospital on 09/06/2021 for osteoarthritis left hip.  She underwent total left hip arthroplasty on 09/06/2021.  Postoperatively she has had complications including delirium.  This was thought secondary to some opiates.  She received Narcan which did improve things somewhat.  Over the last several nights, she has had increasing agitation.  Tonight she complained of some substernal chest pain.  She had a preop ischemic work-up with a stress test that was negative.  Patient is quite confused.  She thinks that she is in [place].  She denies having any surgery recently.  Husband is at the bedside.  He states the patient has been having increasing forgetfulness for at least the last year.  Patient was seen by neurology at [facility] in February 2023.  Patient was diagnosed with moderate Alzheimer type dementia.  She was started on Aricept.  Patient did some more reading on the Internet refused to take it.  Husband states the patient does get confused in the morning at times when she wakes up and does not know if it is morning or nighttime.  He also describes times where she is in the kitchen and is looking for an item but does not remember where her pantry is.  She does not remember where she keeps her plates.  Patient and her husband have been living in the same house for the last 8 years.  EKG reviewed and personally interpreted.  Normal sinus rhythm.  I  disagree with the computer interpretation of ST segment elevation.  She does not have ST segment elevation.  First set of troponin has been drawn.  Have added BMP.  Orders placed to transfer to telemetry unit.    Review of Systems:  Review of Systems  Unable to perform ROS: Dementia    Past Medical History:  Diagnosis Date   Anxiety    Arthritis    CVA (cerebral vascular accident) ([place]) 2019   Diabetes mellitus type 2 in nonobese ([place])    Hyperlipidemia    Hypertension    PCP GAVE FOR HER  BLOOD PRESSURE   IBS (irritable bowel syndrome)    Memory deficit    PUD (peptic ulcer disease)    TIA (transient ischemic attack) 2006    Past Surgical History:  Procedure Laterality Date   ABDOMINAL HYSTERECTOMY     BIOPSY THYROID  04/2014   COLON SURGERY     LAPAROSCOPY N/A 08/25/2015   Procedure: EXPLORATORY LAPAROTOMY, ILEOCECECTOMY, PRIMARY ANASTOMOSIS, LYSIS OF ADHESIONS;  Surgeon: Autumn Messing III, MD;  Location: [facility] ORS;  Service: General;  Laterality: N/A;   laparoscopy for fertility work up     [place] N/A 03/24/2014   Procedure: LOOP RECORDER IMPLANT;  Surgeon: Deboraha Sprang, MD;  Location: [facility] CATH LAB;  Service: Cardiovascular;  Laterality: N/A;   LUMBAR LAMINECTOMY/DECOMPRESSION MICRODISCECTOMY  04/25/2011   Procedure: LUMBAR LAMINECTOMY/DECOMPRESSION MICRODISCECTOMY;  Surgeon: [name], MD;  Location: [facility] NEURO ORS;  Service: Neurosurgery;  Laterality: Right;  Right Lumbar Four-Five Discectomy   OOPHORECTOMY  TONSILLECTOMY     TOTAL HIP ARTHROPLASTY Left 09/06/2021   Procedure: TOTAL HIP ARTHROPLASTY ANTERIOR APPROACH;  Surgeon: [name], MD;  Location: [facility] ORS;  Service: Orthopedics;  Laterality: Left;     reports that she quit smoking about 17 years ago. Her smoking use included cigarettes. She has never used smokeless tobacco. She reports current alcohol use of about 2.0 standard drinks of alcohol per week. She reports that she does not use  drugs.  Allergies  Allergen Reactions   Codeine Nausea Only   Morphine And Related Itching    Family History  Problem Relation Age of Onset   Cancer Sister        lung   Diabetes Sister    Cancer Brother        lung   Colon cancer Paternal Uncle     Prior to Admission medications   Medication Sig Start Date End Date Taking? Authorizing Provider  amLODipine (NORVASC) 5 MG tablet Take 1 tablet (5 mg total) by mouth daily. 07/06/21 10/04/21 Yes Park Liter, MD  apixaban (ELIQUIS) 5 MG TABS tablet Take 1 tablet (5 mg total) by mouth 2 (two) times daily. 12/10/18  Yes Shelly Coss, MD  azelastine (ASTELIN) 0.1 % nasal spray Place 2 sprays into both nostrils as needed for rhinitis or allergies. 05/10/20  Yes [provider]  Cholecalciferol (VITAMIN D) 125 MCG (5000 UT) CAPS Take 5,000 Units by mouth daily.   Yes [provider]  Coenzyme Q10 (COQ10) 100 MG CAPS Take 100 mg by mouth daily.   Yes [provider]  Ferrous Sulfate 90 (18 Fe) MG TABS Take 18 mg by mouth daily.   Yes [provider]  furosemide (LASIX) 20 MG tablet Take 1 tablet (20 mg total) by mouth daily. 07/06/21 10/04/21 Yes Park Liter, MD  isosorbide mononitrate (IMDUR) 60 MG 24 hr tablet Take 1 tablet (60 mg total) by mouth daily. 03/08/21  Yes Shirley Friar, PA-C  losartan (COZAAR) 50 MG tablet Take 1 tablet by mouth once daily 06/19/21  Yes Deboraha Sprang, MD  nitroGLYCERIN (NITROSTAT) 0.4 MG SL tablet Place 1 tablet (0.4 mg total) under the tongue every 5 (five) minutes as needed for chest pain. 07/06/21  Yes Park Liter, MD  OVER THE COUNTER MEDICATION Take 2 tablets by mouth daily. Memory and Nerve Support   Yes [provider]  propranolol (INDERAL) 10 MG tablet Take 1 tablet (10 mg total) by mouth every 4 (four) hours as needed. Patient taking differently: Take 10 mg by mouth every 4 (four) hours as needed (Rapid and irregular HR). 12/22/20  Yes  Deboraha Sprang, MD  rosuvastatin (CRESTOR) 5 MG tablet Take 1 tablet (5 mg total) by mouth daily. 04/07/21  Yes Deboraha Sprang, MD  Zinc 50 MG TABS Take 50 mg by mouth daily.   Yes [provider]  methocarbamol (ROBAXIN) 500 MG tablet Take 1 tablet (500 mg total) by mouth every 6 (six) hours as needed for muscle spasms. 09/08/21   Derl Barrow, PA    Physical Exam: Vitals:   09/08/21 2312 09/08/21 2344 09/08/21 2350 09/08/21 2354  BP: 132/68 (!) 134/59 125/69 118/73  Pulse: 65 64 68 73  Resp:  '20 20 17  '$ Temp: (P) 97.7 F (36.5 C) 97.7 F (36.5 C) 97.7 F (36.5 C)   TempSrc: (P) Oral Oral Oral   SpO2: 98%  96% 96%  Weight:      Height:  Physical Exam Vitals and nursing note reviewed.  Constitutional:      General: She is not in acute distress.    Appearance: She is not ill-appearing, toxic-appearing or diaphoretic.     Comments: Pleasantly confused. Falls asleep easily.  HENT:     Head: Normocephalic and atraumatic.     Nose: Nose normal.  Cardiovascular:     Rate and Rhythm: Normal rate and regular rhythm.     Pulses: Normal pulses.  Pulmonary:     Effort: Pulmonary effort is normal.  Abdominal:     General: Bowel sounds are normal. There is no distension.     Palpations: Abdomen is soft.     Tenderness: There is no abdominal tenderness.  Musculoskeletal:     Right lower leg: No edema.     Left lower leg: No edema.  Skin:    General: Skin is warm and dry.     Capillary Refill: Capillary refill takes less than 2 seconds.  Neurological:     Mental Status: She is alert. She is disoriented.      Labs on Admission: I have personally reviewed following labs and imaging studies  CBC: Recent Labs  Lab 09/07/21 0343 09/08/21 0338  WBC 18.1* 18.9*  HGB 11.3* 10.6*  HCT 35.7* 32.9*  MCV 103.2* 101.5*  PLT 140* 287   Basic Metabolic Panel: Recent Labs  Lab 09/07/21 0343  NA 141  K 4.7  CL 103  CO2 28  GLUCOSE 153*  BUN 32*  CREATININE  1.55*  CALCIUM 9.4   GFR: Estimated Creatinine Clearance: 32.8 mL/min (A) (by C-G formula based on SCr of 1.55 mg/dL (H)). Liver Function Tests: No results for input(s): "AST", "ALT", "ALKPHOS", "BILITOT", "PROT", "ALBUMIN" in the last 168 hours. No results for input(s): "LIPASE", "AMYLASE" in the last 168 hours. No results for input(s): "AMMONIA" in the last 168 hours. Coagulation Profile: No results for input(s): "INR", "PROTIME" in the last 168 hours. Cardiac Enzymes: No results for input(s): "CKTOTAL", "CKMB", "CKMBINDEX", "TROPONINI", "TROPONINIHS" in the last 168 hours. BNP (last 3 results) Recent Labs    07/06/21 1418  PROBNP 338*   HbA1C: No results for input(s): "HGBA1C" in the last 72 hours. CBG: No results for input(s): "GLUCAP" in the last 168 hours. Lipid Profile: No results for input(s): "CHOL", "HDL", "LDLCALC", "TRIG", "CHOLHDL", "LDLDIRECT" in the last 72 hours. Thyroid Function Tests: No results for input(s): "TSH", "T4TOTAL", "FREET4", "T3FREE", "THYROIDAB" in the last 72 hours. Anemia Panel: No results for input(s): "VITAMINB12", "FOLATE", "FERRITIN", "TIBC", "IRON", "RETICCTPCT" in the last 72 hours. Urine analysis:    Component Value Date/Time   COLORURINE COLORLESS (A) 12/08/2018 0255   APPEARANCEUR CLEAR 12/08/2018 0255   LABSPEC 1.005 12/08/2018 0255   PHURINE 7.0 12/08/2018 0255   GLUCOSEU NEGATIVE 12/08/2018 0255   HGBUR NEGATIVE 12/08/2018 0255   BILIRUBINUR NEGATIVE 12/08/2018 0255   KETONESUR NEGATIVE 12/08/2018 0255   PROTEINUR NEGATIVE 12/08/2018 0255   NITRITE NEGATIVE 12/08/2018 0255   LEUKOCYTESUR NEGATIVE 12/08/2018 0255    Radiological Exams on Admission: I have personally reviewed images No results found.  EKG: My personal interpretation of EKG shows: NSR. NO ST elevation noted. I disagree with computer interpretation     Assessment/Plan Principal Problem:   Hip arthritis Active Problems:   Chest pain of uncertain  etiology   Moderate late onset Alzheimer's dementia (HCC)   Paroxysmal A-fib (HCC)   Stage 3a chronic kidney disease (HCC)   Primary osteoarthritis of left hip  S/P total left hip arthroplasty   Diabetes mellitus type 2, controlled, with complications ([place])    Assessment and Plan: * Hip arthritis S/p left THA. Management per ortho.  Chest pain of uncertain etiology Unlikely coronary related. Normal stress test last night. EKG without any ST changes. I disagree with computer interpretation that states ST elevation. Pt already on Eliquis bid. Trend serial troponin. Will moved to telemetry bed overnight.  Moderate late onset Alzheimer's dementia ([place]) dx'd in 05-2021 by [facility] neurologist. He Rx'd aricept. Pt refused to take it.  Husband states over last year, he has noticed pt becoming more forgetful. Pt wakes up in the morning and does not know if it is morning or evening. Pt sometimes gets confused in her own kitchen on where the pantry is located. Pt no longer drives a car. Would avoid any sedative if possible. Keep sleep/wake cycles consistent.  Blinds open during the day to allow for natural night. Turn off all lights at night. Will Rx melatonin at night.  S/P total left hip arthroplasty Management per ortho.  Primary osteoarthritis of left hip Management per ortho.  Stage 3a chronic kidney disease (HCC) Stable. Check BMP.  Paroxysmal A-fib (HCC) Stable. On NSR. On Eliquis.   DVT prophylaxis: Eliquis Code Status: Full Code Family Communication: discussed with pt's husband at bedside. Discussed with rapid response RN  Disposition Plan: transfer to telemetry unit    Kristopher Oppenheim, DO [facility] 09/09/2021, 1:10 AM

## 2021-09-09 NOTE — Assessment & Plan Note (Addendum)
Patient is not on rate/antiarrhythmic medication management as an outpatient. She uses propranolol only as PRN. She is managed on Eliquis 5 mg BID. Transitioned to heparin IV secondary to NSTEMI. Rate controlled and NSR. -Hold Eliquis

## 2021-09-09 NOTE — Subjective & Objective (Signed)
Reason for consult: Chest pain Requesting provider Dr. Wynelle Link History present illness: 76 year old female history of paroxysmal atrial fibrillation, hypertension, chronic anticoagulation for A-fib, moderate dementia, type 2 diabetes (diet-controlled) who was admitted to hospital on 09/06/2021 for osteoarthritis left hip.  She underwent total left hip arthroplasty on 09/06/2021.  Postoperatively she has had complications including delirium.  This was thought secondary to some opiates.  She received Narcan which did improve things somewhat.  Over the last several nights, she has had increasing agitation.  Tonight she complained of some substernal chest pain.  She had a preop ischemic work-up with a stress test that was negative.  Patient is quite confused.  She thinks that she is in [place].  She denies having any surgery recently.  Husband is at the bedside.  He states the patient has been having increasing forgetfulness for at least the last year.  Patient was seen by neurology at [facility] in February 2023.  Patient was diagnosed with moderate Alzheimer type dementia.  She was started on Aricept.  Patient did some more reading on the Internet refused to take it.  Husband states the patient does get confused in the morning at times when she wakes up and does not know if it is morning or nighttime.  He also describes times where she is in the kitchen and is looking for an item but does not remember where her pantry is.  She does not remember where she keeps her plates.  Patient and her husband have been living in the same house for the last 8 years.  EKG reviewed and personally interpreted.  Normal sinus rhythm.  I disagree with the computer interpretation of ST segment elevation.  She does not have ST segment elevation.  First set of troponin has been drawn.  Have added BMP.  Orders placed to transfer to telemetry unit.

## 2021-09-09 NOTE — Progress Notes (Signed)
[facility] Short progress note  Reviewed consult note from Dr. Bridgett Larsson and examined the patient.  The patient is sleepy but does awaken and is communicative.  She is confused.   Today's Vitals   09/08/21 2354 09/09/21 0200 09/09/21 0209 09/09/21 1209  BP: 118/73  (!) 133/59 97/73  Pulse: 73  (!) 57 85  Resp: 17  (!) 22 19  Temp:   98.8 F (37.1 C) 98.3 F (36.8 C)  TempSrc:    Oral  SpO2: 96%  99% 98%  Weight:      Height:      PainSc:  Asleep     Body mass index is 26.44 kg/m.   Principal Problem:   NSTEMI (non-ST elevated myocardial infarction) ([place]) -Has history of nonobstructive coronary artery disease - Complained of chest pain on 6/9-troponin noted to be greater than 24,000 - 2D echo shows severe wall motion abnormality in the LV - Continue IV heparin-plan for cardiac cath on Monday 6/12 -Cardiology also changed 325 of aspirin to 81 mg of aspirin today and increased rosuvastatin from 5 to 20 mg -She is receiving Cozaar, Imdur, amlodipine, furosemide and as needed propranolol  Active Problems:   Moderate late onset Alzheimer's dementia ([place]) -Has had significant increasing confusion and agitation after her hip replacement and was given doses of Ativan to help control this on 6/8-she has not needed Ativan since -Is not on medication for this at home - Currently appears quite calm-her husband has been staying in the hospital overnight which may be helping    Hip arthritis  - S/P total left hip arthroplasty on 09/06/2021 - Plan was to go home after the hospital stay but due to increasing agitation has not been able to go home yet - Eliquis was planned for DVT prophylaxis-currently on heparin infusion - Weightbearing as tolerated    Paroxysmal A-fib ([facility]) - Eliquis being replaced by heparin - Currently in normal sinus rhythm    Stage 3a chronic kidney disease ([place]) -Follow creatinine intermittently  Leukocytosis - WBC count was 18.9 on 6/9 - Chest x-ray  performed this morning does not show any infiltrates -Will recheck tomorrow  Macrocytic anemia - Hemoglobin was 10.6 yesterday - We will recheck tomorrow    Diabetes mellitus type 2, controlled, with complications ([place]) -Not on medications at home for this and therefore likely diet controlled Hemoglobin A1C    Component Value Date/Time   HGBA1C 5.8 (H) 08/24/2021 1447  -Due to acute MI, we will begin to check sugars in the hospital and give insulin if needed  Debbe Odea, MD

## 2021-09-09 NOTE — Significant Event (Addendum)
  X-cover Note: See consult note. Initial troponin >24K.  Repeat EKG is normal. No STEMI. Discussed with cardiology. Plan on transfer to [facility]. Start heparin gtts. Hold eliquis. Discussed with Dr. Wynelle Link with ortho. He is okay with pt having IV heparin s/p left total hip arthroplasty.  Discussed pt's findings with husband at bedside.  Unusually for her to have such high troponins without EKG changes. Would have expected some q-waves if this was a post-op NSTEMI. Other possibilities include myocarditis. No ST or PR changes on EKG to suggest pericarditis.  Will need echo.   Will need cardiology to see patient on arrival to [facility].  [facility] will assume role at attending.  Dr. Wynelle Link will continue to follow as consultant.  Kristopher Oppenheim, DO [facility]

## 2021-09-09 NOTE — Assessment & Plan Note (Addendum)
Noted. Delirium risk. Not on medication management. -Delirium precautions

## 2021-09-09 NOTE — Progress Notes (Signed)
PT Cancellation Note  Patient Details Name: Tiffany Wiggins MRN: [id number] DOB: [birth year]   Cancelled Treatment:    Reason Eval/Treat Not Completed: Medical issues which prohibited therapy Elevated troponins and awaiting cardiology and transfer to [facility]   Myrtis Hopping [place] 09/09/2021, 9:37 AM Jannette Spanner PT, DPT Acute Rehabilitation Services Pager: [phone] Office: [phone]

## 2021-09-09 NOTE — Consult Note (Signed)
Cardiology Consultation:   Patient ID: Tiffany Wiggins MRN: [id number]; DOB: [birth year]  Admit date: 09/06/2021 Date of Consult: 09/09/2021  PCP:  Raina Mina., MD   [facility] Providers Cardiologist:  Jenne Campus, MD  Electrophysiologist:  Virl Axe, MD       Patient Profile:   Tiffany Wiggins is a 76 y.o. female with a hx of non-obs CAD by cardiac CT 2020, nl MV 07/2021, CVA, DM2, HTN, HLD, IBS, PUD, who is being seen 09/09/2021 for the evaluation of elevated troponin at the request of Dr Wynelle Cleveland.  History of Present Illness:   Tiffany Wiggins was admitted 06/07 with L hip pain and plans for L THR, which was performed by Dr. Maureen Ralphs on 6.7. Since her surgery, she has had problems w/ sundowning, daytime confusion. She was able to work w/ PT, no c/o chest pain or SOB.  She continued to have problems w/ overnight combativeness and confusion. She required restraints and at one point, had to have Narcan as they could not rouse her after being given benzo + pain med.   06/09, pt confused and agitated all day. In the evening, she improved.  Early this am, she c/o chest pain. VSS, hard for Dr Maureen Ralphs to get an accurate history. Troponin drawn and pt tx telemetry. Pt tx hospitalist svc.   Trop > 24,000 >> Cards asked to see.   She was last seen by Dr. Fraser Din on 08/09/21. She was not having symptoms at that time. Nuclear stress test was done for preop evaluation, with no evidence of ischemia, only mild fixed defect in mid anterior wall.  I spoke to her husband Tiffany Wiggins over the phone. He was present overnight and was able to give collateral history.  She did well with surgery, was able to work with PT same day. However, on 6/8 she became combative, which is completely out of character for her. She needed restraints and medications, as well as a Actuary. He reports that around 2 PM and 6/9, she cleared and was back to her normal self. He stayed the night with her last night and noted  that she woke from sleep with 9/10 chest pain on the left side. Rest of event as noted.  Has never needed nitro at home. Use propranolol PRN.  On my initial interview, she was very sleepy, would wake only briefly to voice. On re-evaluation, she is easily awakened and answers questions.  Past Medical History:  Diagnosis Date   Anxiety    Arthritis    CVA (cerebral vascular accident) ([place]) 2019   Diabetes mellitus type 2 in nonobese ([place])    Hyperlipidemia    Hypertension    PCP GAVE FOR HER  BLOOD PRESSURE   IBS (irritable bowel syndrome)    Memory deficit    PUD (peptic ulcer disease)    TIA (transient ischemic attack) 2006    Past Surgical History:  Procedure Laterality Date   ABDOMINAL HYSTERECTOMY     BIOPSY THYROID  04/2014   COLON SURGERY     LAPAROSCOPY N/A 08/25/2015   Procedure: EXPLORATORY LAPAROTOMY, ILEOCECECTOMY, PRIMARY ANASTOMOSIS, LYSIS OF ADHESIONS;  Surgeon: Autumn Messing III, MD;  Location: [facility] ORS;  Service: General;  Laterality: N/A;   laparoscopy for fertility work up     [place] N/A 03/24/2014   Procedure: LOOP RECORDER IMPLANT;  Surgeon: Deboraha Sprang, MD;  Location: [facility] CATH LAB;  Service: Cardiovascular;  Laterality: N/A;   LUMBAR LAMINECTOMY/DECOMPRESSION MICRODISCECTOMY  04/25/2011  Procedure: LUMBAR LAMINECTOMY/DECOMPRESSION MICRODISCECTOMY;  Surgeon: [name], MD;  Location: [place] [facility] NEURO ORS;  Service: Neurosurgery;  Laterality: Right;  Right Lumbar Four-Five Discectomy   OOPHORECTOMY     TONSILLECTOMY     TOTAL HIP ARTHROPLASTY Left 09/06/2021   Procedure: TOTAL HIP ARTHROPLASTY ANTERIOR APPROACH;  Surgeon: [name], MD;  Location: [facility];  Service: Orthopedics;  Laterality: Left;     Home Medications:  Prior to Admission medications   Medication Sig Start Date End Date Taking? Authorizing Provider  amLODipine (NORVASC) 5 MG tablet Take 1 tablet (5 mg total) by mouth daily. 07/06/21 10/04/21 Yes Park Liter, MD  apixaban  (ELIQUIS) 5 MG TABS tablet Take 1 tablet (5 mg total) by mouth 2 (two) times daily. 12/10/18  Yes Shelly Coss, MD  azelastine (ASTELIN) 0.1 % nasal spray Place 2 sprays into both nostrils as needed for rhinitis or allergies. 05/10/20  Yes [provider]  Cholecalciferol (VITAMIN D) 125 MCG (5000 UT) CAPS Take 5,000 Units by mouth daily.   Yes [provider]  Coenzyme Q10 (COQ10) 100 MG CAPS Take 100 mg by mouth daily.   Yes [provider]  Ferrous Sulfate 90 (18 Fe) MG TABS Take 18 mg by mouth daily.   Yes [provider]  furosemide (LASIX) 20 MG tablet Take 1 tablet (20 mg total) by mouth daily. 07/06/21 10/04/21 Yes Park Liter, MD  isosorbide mononitrate (IMDUR) 60 MG 24 hr tablet Take 1 tablet (60 mg total) by mouth daily. 03/08/21  Yes Shirley Friar, PA-C  losartan (COZAAR) 50 MG tablet Take 1 tablet by mouth once daily 06/19/21  Yes Deboraha Sprang, MD  nitroGLYCERIN (NITROSTAT) 0.4 MG SL tablet Place 1 tablet (0.4 mg total) under the tongue every 5 (five) minutes as needed for chest pain. 07/06/21  Yes Park Liter, MD  OVER THE COUNTER MEDICATION Take 2 tablets by mouth daily. Memory and Nerve Support   Yes [provider]  propranolol (INDERAL) 10 MG tablet Take 1 tablet (10 mg total) by mouth every 4 (four) hours as needed. Patient taking differently: Take 10 mg by mouth every 4 (four) hours as needed (Rapid and irregular HR). 12/22/20  Yes Deboraha Sprang, MD  rosuvastatin (CRESTOR) 5 MG tablet Take 1 tablet (5 mg total) by mouth daily. 04/07/21  Yes Deboraha Sprang, MD  Zinc 50 MG TABS Take 50 mg by mouth daily.   Yes [provider]  methocarbamol (ROBAXIN) 500 MG tablet Take 1 tablet (500 mg total) by mouth every 6 (six) hours as needed for muscle spasms. 09/08/21   Derl Barrow, PA    Inpatient Medications: Scheduled Meds:  amLODipine  5 mg Oral Daily   [START ON 09/10/2021] aspirin EC  81 mg Oral Daily    docusate sodium  100 mg Oral BID   ferrous sulfate  325 mg Oral Daily   furosemide  20 mg Oral Daily   isosorbide mononitrate  60 mg Oral Daily   losartan  50 mg Oral Daily   [START ON 09/10/2021] rosuvastatin  20 mg Oral Daily   Continuous Infusions:  sodium chloride Stopped (09/07/21 1533)   heparin 900 Units/hr (09/09/21 0347)   methocarbamol (ROBAXIN) IV     PRN Meds: acetaminophen, bisacodyl, melatonin, menthol-cetylpyridinium **OR** phenol, methocarbamol **OR** methocarbamol (ROBAXIN) IV, metoCLOPramide **OR** metoCLOPramide (REGLAN) injection, naLOXone (NARCAN)  injection, naloxone, nitroGLYCERIN, ondansetron **OR** ondansetron (ZOFRAN) IV, perflutren lipid microspheres (DEFINITY) IV suspension, polyethylene glycol, propranolol  Allergies:    Allergies  Allergen Reactions   Codeine Nausea Only   Morphine And Related Itching    Social History:   Social History   Socioeconomic History   Marital status: Married    Spouse name: Not on file   Number of children: 0   Years of education: Not on file   Highest education level: Not on file  Occupational History   Occupation: retired  Tobacco Use   Smoking status: Former    Types: Cigarettes    Quit date: 04/02/2004    Years since quitting: 17.4   Smokeless tobacco: Never  Vaping Use   Vaping Use: Never used  Substance and Sexual Activity   Alcohol use: Yes    Alcohol/week: 2.0 standard drinks of alcohol    Types: 2 Glasses of wine per week   Drug use: No   Sexual activity: Not on file  Other Topics Concern   Not on file  Social History Narrative   Not on file   Social Determinants of Health   Financial Resource Strain: Not on file  Food Insecurity: Not on file  Transportation Needs: Not on file  Physical Activity: Not on file  Stress: Not on file  Social Connections: Not on file  Intimate Partner Violence: Not on file    Family History:   Family History  Problem Relation Age of Onset   Cancer Sister         lung   Diabetes Sister    Cancer Brother        lung   Colon cancer Paternal Uncle      ROS:  Please see the history of present illness.  All other ROS reviewed and negative.     Physical Exam/Data:   Vitals:   09/08/21 2344 09/08/21 2350 09/08/21 2354 09/09/21 0209  BP: (!) 134/59 125/69 118/73 (!) 133/59  Pulse: 64 68 73 (!) 57  Resp: '20 20 17 '$ (!) 22  Temp: 97.7 F (36.5 C) 97.7 F (36.5 C)  98.8 F (37.1 C)  TempSrc: Oral Oral    SpO2:  96% 96% 99%  Weight:      Height:        Intake/Output Summary (Last 24 hours) at 09/09/2021 1037 Last data filed at 09/09/2021 0347 Gross per 24 hour  Intake 243.91 ml  Output 1250 ml  Net -1006.09 ml      09/06/2021    2:00 PM 09/06/2021    9:46 AM 08/24/2021    1:59 PM  Last 3 Weights  Weight (lbs) 163 lb 12.8 oz 164 lb 164 lb  Weight (kg) 74.3 kg 74.39 kg 74.39 kg     Body mass index is 26.44 kg/m.  General:  Well nourished, well developed, in no acute distress HEENT: normal Neck: no JVD Vascular: No carotid bruits; Distal pulses 2+ bilaterally Cardiac:  normal S1, S2; RRR; no murmur  Lungs:  clear to auscultation bilaterally, no wheezing, rhonchi or rales  Abd: soft, nontender, no hepatomegaly  Ext: no edema Musculoskeletal:  No deformities, BUE and BLE strength normal and equal Skin: warm and dry  Neuro:  CNs 2-12 intact, no focal abnormalities noted Psych:  Normal affect   EKG:  The EKG was personally reviewed and demonstrates:  SR, there is now concave up 1 mm ST elevation in V6, without convexity or adjacent changes Telemetry:  Telemetry was personally reviewed and demonstrates:  SR with rare PVCs  Relevant CV Studies:  MYOVIEW: 08/15/2021   Findings  are consistent with no  ischemia and no prior myocardial infarction. The study is low risk.   No ST deviation was noted.   Left ventricular function is normal. Nuclear stress EF: 78 %. The left ventricular ejection fraction is hyperdynamic (>65%). End diastolic  cavity size is normal.   Prior study available for comparison from 06/17/2020.  ECHO: 07/19/2021  1. Left ventricular ejection fraction, by estimation, is 60 to 65%. The  left ventricle has normal function. The left ventricle has no regional  wall motion abnormalities. There is moderate concentric left ventricular  hypertrophy. Left ventricular  diastolic parameters are consistent with Grade I diastolic dysfunction  (impaired relaxation). The average left ventricular global longitudinal  strain is -18.7 %.   2. Right ventricular systolic function is normal. The right ventricular  size is normal.   3. The mitral valve is normal in structure. No evidence of mitral valve  regurgitation. No evidence of mitral stenosis.   4. The aortic valve is tricuspid. Aortic valve regurgitation is not  visualized. No aortic stenosis is present.   5. The inferior vena cava is normal in size with greater than 50%  respiratory variability, suggesting right atrial pressure of 3 mmHg.   CARDIAC CT: 12/09/2018 Calcium Score: 482 Agatston units.   Coronary Arteries: Left dominant with no anomalies   LM: Calcified plaque at the ostium, mild (<50%) stenosis.   LAD system: Calcified plaque proximal LAD, mild (<50%) stenosis. Calcified plaque mid LAD, probably mild (<50%) stenosis but difficult to tell with blooming artifact.   Circumflex system: Moderate ramus. Mixed plaque proximally, mild (<50%) stenosis. Calcified plaque with up to 50% stenosis in the mid ramus. Large, dominant LCx providing left PDA. Calcified plaque proximal LCx, mild <50% stenosis. Calcified plaque distal LCx, mild <50% stenosis). PDA is not well-visualized due to motion artifact but I do not think that there is significant disease.   RCA system: Small, nondominant RCA. Calcified plaque proximally, up to 50% stenosis.   IMPRESSION: 1. Coronary artery calcium score 482 Agatston units. This places the patient in the 91st percentile  for age and gender, suggesting high risk for future cardiac events.   2. Extensive calcified plaque. There does not appear to be critical stenosis present in any of the vessels, but will send for FFR to confirm.   Air traffic controller  Laboratory Data:  High Sensitivity Troponin:   Recent Labs  Lab 09/09/21 0041 09/09/21 0318  TROPONINIHS >24,000* >24,000*     Chemistry Recent Labs  Lab 09/07/21 0343 09/09/21 0041  NA 141 143  K 4.7 3.8  CL 103 107  CO2 28 27  GLUCOSE 153* 185*  BUN 32* 34*  CREATININE 1.55* 1.39*  CALCIUM 9.4 9.4  GFRNONAA 35* 40*  ANIONGAP 10 9    No results for input(s): "PROT", "ALBUMIN", "AST", "ALT", "ALKPHOS", "BILITOT" in the last 168 hours. Lipids  Recent Labs  Lab 09/09/21 0318  CHOL 159  TRIG 71  HDL 81  LDLCALC 64  CHOLHDL 2.0    Hematology Recent Labs  Lab 09/07/21 0343 09/08/21 0338  WBC 18.1* 18.9*  RBC 3.46* 3.24*  HGB 11.3* 10.6*  HCT 35.7* 32.9*  MCV 103.2* 101.5*  MCH 32.7 32.7  MCHC 31.7 32.2  RDW 12.1 12.2  PLT 140* 164   Thyroid No results for input(s): "TSH", "FREET4" in the last 168 hours.  BNPNo results for input(s): "BNP", "PROBNP" in the last 168 hours.  DDimer No results for input(s): "DDIMER" in the last  168 hours.   Radiology/Studies:  ECHOCARDIOGRAM COMPLETE  Result Date: 09/09/2021    ECHOCARDIOGRAM REPORT   Patient Name:   MIELA DESJARDIN Date of Exam: 09/09/2021 Medical Rec #:  [id number]     Height:       66.0 in Accession #:    [id number]    Weight:       163.8 lb Date of Birth:  [birth year]    BSA:          1.837 m Patient Age:    18 years      BP:           133/59 mmHg Patient Gender: F             HR:           74 bpm. Exam Location:  Inpatient Procedure: 2D Echo, Cardiac Doppler, Color Doppler and Intracardiac            Opacification Agent Indications:    Elevated troponin  History:        Patient has prior history of Echocardiogram examinations, most                 recent 07/19/2021. Risk  Factors:Hypertension and Diabetes.  Sonographer:    Jefferey Pica Referring Phys: [place]  1. Left ventricular ejection fraction, by estimation, is 30 to 35%. The left ventricle has moderately decreased function. The left ventricle demonstrates regional wall motion abnormalities (see scoring diagram/findings for description). There is mild concentric left ventricular hypertrophy. Left ventricular diastolic parameters are indeterminate.  2. Right ventricular systolic function is normal. The right ventricular size is normal. There is normal pulmonary artery systolic pressure.  3. Left atrial size was mildly dilated.  4. The mitral valve is normal in structure. Trivial mitral valve regurgitation. No evidence of mitral stenosis.  5. The aortic valve is grossly normal. Aortic valve regurgitation is not visualized. No aortic stenosis is present.  6. The inferior vena cava is dilated in size with >50% respiratory variability, suggesting right atrial pressure of 8 mmHg. Comparison(s): Changes from prior study are noted. Conclusion(s)/Recommendation(s): Findings consistent with ischemic cardiomyopathy. New wall motion abnormalities, most severe in lateral wall and with hypokinesis in the inferior wall. Distribution concerning for circumflex ischemia. Findings communicated with Dr. Wynelle Cleveland. FINDINGS  Left Ventricle: Left ventricular ejection fraction, by estimation, is 30 to 35%. The left ventricle has moderately decreased function. The left ventricle demonstrates regional wall motion abnormalities. Definity contrast agent was given IV to delineate the left ventricular endocardial borders. The left ventricular internal cavity size was normal in size. There is mild concentric left ventricular hypertrophy. Left ventricular diastolic parameters are indeterminate.  LV Wall Scoring: The entire lateral wall is akinetic. The entire inferior wall is hypokinetic. The entire anterior wall, entire septum, and apex  are normal. Right Ventricle: The right ventricular size is normal. No increase in right ventricular wall thickness. Right ventricular systolic function is normal. There is normal pulmonary artery systolic pressure. The tricuspid regurgitant velocity is 2.58 m/s, and  with an assumed right atrial pressure of 8 mmHg, the estimated right ventricular systolic pressure is 27.2 mmHg. Left Atrium: Left atrial size was mildly dilated. Right Atrium: Right atrial size was normal in size. Pericardium: There is no evidence of pericardial effusion. Mitral Valve: The mitral valve is normal in structure. Trivial mitral valve regurgitation. No evidence of mitral valve stenosis. Tricuspid Valve: The tricuspid valve is normal in structure. Tricuspid valve regurgitation is mild .  No evidence of tricuspid stenosis. Aortic Valve: The aortic valve is grossly normal. Aortic valve regurgitation is not visualized. No aortic stenosis is present. Aortic valve peak gradient measures 7.6 mmHg. Pulmonic Valve: The pulmonic valve was grossly normal. Pulmonic valve regurgitation is trivial. No evidence of pulmonic stenosis. Aorta: The aortic root, ascending aorta, aortic arch and descending aorta are all structurally normal, with no evidence of dilitation or obstruction. Venous: The inferior vena cava is dilated in size with greater than 50% respiratory variability, suggesting right atrial pressure of 8 mmHg. IAS/Shunts: The atrial septum is grossly normal.  LEFT VENTRICLE PLAX 2D LVIDd:         5.15 cm     Diastology LVIDs:         4.10 cm     LV e' medial:    4.12 cm/s LV PW:         1.25 cm     LV E/e' medial:  17.4 LV IVS:        1.20 cm     LV e' lateral:   3.81 cm/s LVOT diam:     1.85 cm     LV E/e' lateral: 18.8 LV SV:         65 LV SV Index:   36 LVOT Area:     2.69 cm  LV Volumes (MOD) LV vol d, MOD A4C: 86.3 ml LV vol s, MOD A4C: 59.4 ml LV SV MOD A4C:     86.3 ml RIGHT VENTRICLE             IVC RV S prime:     13.00 cm/s  IVC diam:  2.30 cm TAPSE (M-mode): 2.2 cm LEFT ATRIUM             Index        RIGHT ATRIUM           Index LA diam:        3.80 cm 2.07 cm/m   RA Area:     12.30 cm LA Vol (A2C):   48.6 ml 26.45 ml/m  RA Volume:   30.20 ml  16.44 ml/m LA Vol (A4C):   61.2 ml 33.31 ml/m LA Biplane Vol: 56.0 ml 30.48 ml/m  AORTIC VALVE                 PULMONIC VALVE AV Area (Vmax): 2.14 cm     PV Vmax:       0.86 m/s AV Vmax:        138.00 cm/s  PV Peak grad:  2.9 mmHg AV Peak Grad:   7.6 mmHg LVOT Vmax:      110.00 cm/s LVOT Vmean:     73.100 cm/s LVOT VTI:       0.243 m  AORTA Ao Root diam: 3.00 cm Ao Asc diam:  2.70 cm MITRAL VALVE                TRICUSPID VALVE MV Area (PHT): 3.37 cm     TR Peak grad:   26.6 mmHg MV Decel Time: 225 msec     TR Vmax:        258.00 cm/s MV E velocity: 71.60 cm/s MV A velocity: 111.00 cm/s  SHUNTS MV E/A ratio:  0.65         Systemic VTI:  0.24 m  Systemic Diam: 1.85 cm Buford Dresser MD Electronically signed by Buford Dresser MD Signature Date/Time: 09/09/2021/9:53:38 AM    Final    DG CHEST PORT 1 VIEW  Result Date: 09/09/2021 CLINICAL DATA:  Elevated cardiac enzymes EXAM: PORTABLE CHEST 1 VIEW COMPARISON:  12/08/2018 FINDINGS: Mild cardiomegaly. No confluent airspace opacities or effusions. No acute bony abnormality. IMPRESSION: Cardiomegaly.  No active disease. Electronically Signed   By: Rolm Baptise M.D.   On: 09/09/2021 03:27   DG Pelvis Portable  Result Date: 09/06/2021 CLINICAL DATA:  Post LEFT hip arthroplasty EXAM: PORTABLE PELVIS 1-2 VIEWS COMPARISON:  Portable exam 1156 hours compared to 12/20/2016 FINDINGS: LEFT hip prosthesis newly identified. Bones demineralized. Advanced degenerative changes of RIGHT hip joint. No fracture, dislocation or bone destruction identified on single AP view. IMPRESSION: LEFT hip prosthesis without acute complication. Osseous demineralization with degenerative changes RIGHT hip joint. Electronically Signed   By:  Lavonia Dana M.D.   On: 09/06/2021 13:04   DG HIP UNILAT WITH PELVIS 1V LEFT  Result Date: 09/06/2021 CLINICAL DATA:  Left hip replacement. EXAM: DG HIP (WITH OR WITHOUT PELVIS) 1V*L* COMPARISON:  Left hip x-rays dated December 20, 2016. FLUOROSCOPY TIME:  Radiation Exposure Index (as provided by the fluoroscopic device): 1.18 mGy Kerma C-arm fluoroscopic images were obtained intraoperatively and submitted for post operative interpretation. FINDINGS: Intraoperative fluoroscopic images demonstrate interval left total hip arthroplasty. Components are well aligned. No acute osseous abnormality. Unchanged moderate right hip osteoarthritis. IMPRESSION: 1. Interval left total hip arthroplasty. Electronically Signed   By: Titus Dubin M.D.   On: 09/06/2021 11:51   DG C-Arm 1-60 Min-No Report  Result Date: 09/06/2021 Fluoroscopy was utilized by the requesting physician.  No radiographic interpretation.   DG C-Arm 1-60 Min-No Report  Result Date: 09/06/2021 Fluoroscopy was utilized by the requesting physician.  No radiographic interpretation.     Assessment and Plan:   Post operative NSTEMI History of prior nonobstructive CAD by CT in 2020 -reported chest pain overnight. hsTn x2 >24,000 -CT with Ca score 482, noted extensive calcified plaque in all coronary vessels. FFR showed that distal LAD FFR was 0.64, but it was unclear if this was due to stenosis in mid LAD or due to gradual narrowing due to extensive plaque. -on my review of FFR, the pLAD was 0.90, and the mid was 0.73, with a delta of 0.17. This is borderline. However, stress test done last month shows no evidence of ischemia, which was reassuring. There was a fixed mild defect in the mid anterior wall -prior echo with normal function/wall motion. Echo this admission pending -LDL 64, on rosuvastatin 5 mg daily. Will increase this to high intensity statin 20 mg dose. -changing from PRN propranolol to metoprolol -on imdur chronically. Has PRN  nitro as well -received aspirin 325 mg today, will change to 81 mg aspirin daily -on outpatient apixaban, received 2 doses of 2.5 mg on 6/9. Has been started on IV heparin -echo as above, new wall motion abnormalities and reduced EF. Findings discussed with patient and her husband.  Risks and benefits of cardiac catheterization have been discussed with the patient.  These include bleeding, infection, kidney damage, stroke, heart attack, death.  The patient understands these risks and is willing to proceed.   If she remains stable and chest pain free, will plan for cath on Monday 6/12. If she has recurrent unmanageable pain or ECG changes, would consider urgent cath over the weekend.  Paroxysmal atrial fib -CHA2DS2/VAS Stroke Risk Points=7 -on  apixaban as outpatient, now on heparin as above  History of CVA Type II diabetes Hypertension Hyperlipidemia -lipids as above -last A1c 5.8 -on losartan 50 mg daily, imdur 60 mg daily, amlodipine 5 mg daily -on lasix 20 mg daily as an outpatient,   S/P L THR History of PUD -monitor CBC for blood loss. Recent baselind Hgb 10-11  High risk medical condition, high risk medical decision making, extensive review of prior records, speaking to husband via phone, discussion with Dr. Wynelle Cleveland.   Risk Assessment/Risk Scores:     TIMI Risk Score for Unstable Angina or Non-ST Elevation MI:   The patient's TIMI risk score is  , which indicates a  % risk of all cause mortality, new or recurrent myocardial infarction or need for urgent revascularization in the next 14 days.    For questions or updates, please contact [place] Please consult [email or web address] for contact info under   Note prepped with assistance from Rosaria Ferries, [place]  Signed, Buford Dresser, MD  09/09/2021 10:37 AM

## 2021-09-09 NOTE — Progress Notes (Addendum)
   09/08/21 2312  Vitals  Temp 97.7 F (36.5 C)  Temp Source Oral  BP 132/68  MAP (mmHg) 88  BP Location Left Arm  BP Method Automatic  Patient Position (if appropriate) Lying  Pulse Rate 65  Pulse Rate Source Monitor  Level of Consciousness  Level of Consciousness Alert  MEWS COLOR  MEWS Score Color Green  Oxygen Therapy  SpO2 98 %  O2 Device Nasal Cannula  O2 Flow Rate (L/min) 2 L/min  Pain Assessment  Pain Scale PAINAD  Faces Pain Scale 4  Pain Type Acute pain  Pain Location Chest  Pain Orientation Mid  Pain Descriptors / Indicators Aching  Pain Frequency Constant  Pain Onset Sudden  Patients Stated Pain Goal 0  Pain Intervention(s) Repositioned;RN made aware;MD notified (Comment)  Multiple Pain Sites No  PAINAD (Pain Assessment in Advanced Dementia)  Breathing 1  Negative Vocalization 1  Facial Expression 1  Body Language 1  Consolability 1  PAINAD Score 5  MEWS Score  MEWS Temp 0  MEWS Systolic 0  MEWS Pulse 0  MEWS RR 0  MEWS LOC 0  MEWS Score 0  Provider Notification  Provider Name/Title On-Call Pa  Date Provider Notified 09/08/21  Time Provider Notified 2334  Method of Notification Page  Notification Reason Change in status (C/O chest pain, need order for EKG and medical consult.)  Provider response See new orders  Date of Provider Response 09/08/21  Time of Provider Response 2350 (2nd page went to Attending Dr Wynelle Link)  Rapid Response Notification  Name of Rapid Response RN Notified Ganelle RN  Date Rapid Response Notified 09/08/21  Time Rapid Response Notified 2319   23:50 Chest pain progressed to 9/10, Rapid RN in attendance to Patient.  See MAR for time of Nitro with good effect for patient to go to sleep and pain be reduced.  Baseline Disorientation is being managed with presence of husband and Tele-sitter.  Restraints had been removed at shift change with Patient responding well to less restrictive measures.  See new orders from Attending  per Rapid RN and TRH DO.  00:30 Received order for transferring Patient to Telemetry.. 1402.  Writer will use S-BAR hand-off.

## 2021-09-09 NOTE — Significant Event (Signed)
  X-cover Note: 2nd troponin >24K. IV heparin gtts started. Awaiting transfer to [facility]. If still here this AM, hopefully can get her echo done while awaiting transfer to [facility].  Kristopher Oppenheim, DO [facility]

## 2021-09-10 DIAGNOSIS — D649 Anemia, unspecified: Secondary | ICD-10-CM

## 2021-09-10 DIAGNOSIS — I429 Cardiomyopathy, unspecified: Secondary | ICD-10-CM

## 2021-09-10 DIAGNOSIS — I471 Supraventricular tachycardia: Secondary | ICD-10-CM

## 2021-09-10 DIAGNOSIS — N179 Acute kidney failure, unspecified: Secondary | ICD-10-CM

## 2021-09-10 DIAGNOSIS — I214 Non-ST elevation (NSTEMI) myocardial infarction: Secondary | ICD-10-CM

## 2021-09-10 DIAGNOSIS — Z96642 Presence of left artificial hip joint: Secondary | ICD-10-CM | POA: Diagnosis not present

## 2021-09-10 DIAGNOSIS — I480 Paroxysmal atrial fibrillation: Secondary | ICD-10-CM | POA: Diagnosis not present

## 2021-09-10 DIAGNOSIS — I5021 Acute systolic (congestive) heart failure: Secondary | ICD-10-CM | POA: Insufficient documentation

## 2021-09-10 LAB — BASIC METABOLIC PANEL
Anion gap: 10 (ref 5–15)
BUN: 36 mg/dL — ABNORMAL HIGH (ref 8–23)
CO2: 27 mmol/L (ref 22–32)
Calcium: 8.7 mg/dL — ABNORMAL LOW (ref 8.9–10.3)
Chloride: 103 mmol/L (ref 98–111)
Creatinine, Ser: 1.47 mg/dL — ABNORMAL HIGH (ref 0.44–1.00)
GFR, Estimated: 37 mL/min — ABNORMAL LOW (ref >60)
Glucose, Bld: 140 mg/dL — ABNORMAL HIGH (ref 70–99)
Potassium: 3.3 mmol/L — ABNORMAL LOW (ref 3.5–5.1)
Sodium: 140 mmol/L (ref 135–145)

## 2021-09-10 LAB — CBC
HCT: 25.5 % — ABNORMAL LOW (ref 36.0–46.0)
Hemoglobin: 8.3 g/dL — ABNORMAL LOW (ref 12.0–15.0)
MCH: 32.5 pg (ref 26.0–34.0)
MCHC: 32.5 g/dL (ref 30.0–36.0)
MCV: 100 fL (ref 80.0–100.0)
Platelets: 132 10*3/uL — ABNORMAL LOW (ref 150–400)
RBC: 2.55 MIL/uL — ABNORMAL LOW (ref 3.87–5.11)
RDW: 12.1 % (ref 11.5–15.5)
WBC: 9.6 10*3/uL (ref 4.0–10.5)
nRBC: 0 % (ref 0.0–0.2)

## 2021-09-10 LAB — CBC WITH DIFFERENTIAL/PLATELET
Abs Immature Granulocytes: 0.04 10*3/uL (ref 0.00–0.07)
Basophils Absolute: 0 10*3/uL (ref 0.0–0.1)
Basophils Relative: 0 %
Eosinophils Absolute: 0.2 10*3/uL (ref 0.0–0.5)
Eosinophils Relative: 2 %
HCT: 26.8 % — ABNORMAL LOW (ref 36.0–46.0)
Hemoglobin: 8.7 g/dL — ABNORMAL LOW (ref 12.0–15.0)
Immature Granulocytes: 0 %
Lymphocytes Relative: 13 %
Lymphs Abs: 1.2 10*3/uL (ref 0.7–4.0)
MCH: 32.2 pg (ref 26.0–34.0)
MCHC: 32.5 g/dL (ref 30.0–36.0)
MCV: 99.3 fL (ref 80.0–100.0)
Monocytes Absolute: 0.6 10*3/uL (ref 0.1–1.0)
Monocytes Relative: 7 %
Neutro Abs: 7.2 10*3/uL (ref 1.7–7.7)
Neutrophils Relative %: 78 %
Platelets: 127 10*3/uL — ABNORMAL LOW (ref 150–400)
RBC: 2.7 MIL/uL — ABNORMAL LOW (ref 3.87–5.11)
RDW: 12.3 % (ref 11.5–15.5)
WBC: 9.2 10*3/uL (ref 4.0–10.5)
nRBC: 0 % (ref 0.0–0.2)

## 2021-09-10 LAB — GLUCOSE, CAPILLARY
Glucose-Capillary: 144 mg/dL — ABNORMAL HIGH (ref 70–99)
Glucose-Capillary: 156 mg/dL — ABNORMAL HIGH (ref 70–99)
Glucose-Capillary: 114 mg/dL — ABNORMAL HIGH (ref 70–99)
Glucose-Capillary: 156 mg/dL — ABNORMAL HIGH (ref 70–99)

## 2021-09-10 LAB — APTT: aPTT: 90 seconds — ABNORMAL HIGH (ref 24–36)

## 2021-09-10 LAB — HEPARIN LEVEL (UNFRACTIONATED): Heparin Unfractionated: >1.1 IU/mL — ABNORMAL HIGH (ref 0.30–0.70)

## 2021-09-10 MED ORDER — SODIUM CHLORIDE 0.9% FLUSH
3.0000 mL | Freq: Two times a day (BID) | INTRAVENOUS | Status: DC
  Administered 2021-09-10 – 2021-09-12 (×2): 3 mL via INTRAVENOUS

## 2021-09-10 MED ORDER — SODIUM CHLORIDE 0.9 % IV SOLN: 250.0000 mL | INTRAVENOUS | Status: DC | PRN

## 2021-09-10 MED ORDER — ORAL CARE MOUTH RINSE
15.0000 mL | Freq: Two times a day (BID) | OROMUCOSAL | Status: DC
  Administered 2021-09-10 – 2021-09-12 (×5): 15 mL via OROMUCOSAL

## 2021-09-10 MED ORDER — SODIUM CHLORIDE 0.9 % WEIGHT BASED INFUSION
3.0000 mL/kg/h | INTRAVENOUS | Status: DC
  Administered 2021-09-11: 3 mL/kg/h via INTRAVENOUS

## 2021-09-10 MED ORDER — SODIUM CHLORIDE 0.9 % WEIGHT BASED INFUSION
1.0000 mL/kg/h | INTRAVENOUS | Status: DC
Start: 2021-09-11 — End: 2021-09-11
  Administered 2021-09-11: 1 mL/kg/h via INTRAVENOUS

## 2021-09-10 MED ORDER — METOPROLOL TARTRATE 12.5 MG HALF TABLET
12.5000 mg | ORAL_TABLET | Freq: Two times a day (BID) | ORAL | Status: DC
  Administered 2021-09-10: 12.5 mg via ORAL
  Filled 2021-09-10 (×3): qty 1

## 2021-09-10 MED ORDER — ASPIRIN 81 MG PO CHEW
81.0000 mg | CHEWABLE_TABLET | ORAL | Status: AC
  Administered 2021-09-11: 81 mg via ORAL
  Filled 2021-09-10: qty 1

## 2021-09-10 MED ORDER — SODIUM CHLORIDE 0.9% FLUSH: 3.0000 mL | INTRAVENOUS | Status: DC | PRN

## 2021-09-10 MED ORDER — TRAMADOL HCL 50 MG PO TABS
50.0000 mg | ORAL_TABLET | Freq: Four times a day (QID) | ORAL | Status: DC | PRN
  Administered 2021-09-10 – 2021-09-11 (×2): 50 mg via ORAL
  Filled 2021-09-10 (×2): qty 1

## 2021-09-10 NOTE — Progress Notes (Signed)
Subjective: 4 Days Post-Op Procedure(s) (LRB): TOTAL HIP ARTHROPLASTY ANTERIOR APPROACH (Left) Patient reports pain as moderate and severe.   Post-op NSTEMI transferred to [facility] yesterday. Pt and husband concerned about lack of PT given her cardiac events. C/o buttock pain as she has been on bedrest.  Objective: Vital signs in last 24 hours: Temp:  [98.1 F (36.7 C)-98.7 F (37.1 C)] 98.6 F (37 C) (06/11 0716) Pulse Rate:  [60-85] 61 (06/11 0716) Resp:  [19-22] 22 (06/11 0716) BP: (97-120)/(49-95) 115/66 (06/11 0716) SpO2:  [98 %-100 %] 100 % (06/11 0716)  Intake/Output from previous day: 06/10 0701 - 06/11 0700 In: 235.9 [I.V.:235.9] Out: -  Intake/Output this shift: No intake/output data recorded.  Recent Labs    09/08/21 0338 09/10/21 0544  HGB 10.6* 8.7*   Recent Labs    09/08/21 0338 09/10/21 0544  WBC 18.9* 9.2  RBC 3.24* 2.70*  HCT 32.9* 26.8*  PLT 164 127*   Recent Labs    09/09/21 0041  NA 143  K 3.8  CL 107  CO2 27  BUN 34*  CREATININE 1.39*  GLUCOSE 185*  CALCIUM 9.4   No results for input(s): "LABPT", "INR" in the last 72 hours.  Neurologically intact ABD soft Neurovascular intact Sensation intact distally Intact pulses distally Dorsiflexion/Plantar flexion intact Incision: scant drainage No cellulitis present Compartment soft No sign of DVT   Assessment/Plan: 4 Days Post-Op Procedure(s) (LRB): TOTAL HIP ARTHROPLASTY ANTERIOR APPROACH (Left) Advance diet Up with PT as long as stable from a cardiac standpoint Case mgmt consulted for DME needs Stable from ortho standpoint but will need to pass PT prior to D/C  Cecilie Kicks 09/10/2021, 8:44 AM

## 2021-09-10 NOTE — Assessment & Plan Note (Addendum)
Postoperative NSTEMI. Chest pain with troponin >24,000. Heparin IV started and cardiology consulted. Transthoracic Echocardiogram significant for a reduced LVEF of 30-35% with regional wall motion abnormalities. Cardiac catheterization without ischemic disease. Patient discharged on Toprol XL and Crestor.

## 2021-09-10 NOTE — Progress Notes (Addendum)
PROGRESS NOTE    [name]  FAO:[id number] DOB: Jun 23, 1945 DOA: 09/06/2021 PCP: Tiffany Wiggins., MD   Brief Narrative: DAILY DOE is a 76 y.o. female with a history of paroxysmal atrial fibrillation, hypertension, atrial fibrillation, dementia, diabetes mellitus type 2. Patient presented secondary to osteoarthritis and planned total left hip arthroplasty. Post-operatively, patient developed chest pain. Troponin >24,000 without ischemic changes on EKG consistent with post-operative NSTEMI. Heparin IV started. Cardiology consulted with plan for cardiac catheterization.   Assessment and Plan: * Primary osteoarthritis of left hip Patient is s/p left total hip arthroplasty on 6/7. -Orthopedic surgery recommendations: WBAT from orthopedic standpoint -PT/OT  NSTEMI (non-ST elevated myocardial infarction) (Tiffany Wiggins) Postoperative NSTEMI. Chest pain with troponin >24,000. Heparin IV started and cardiology consulted. Transthoracic Echocardiogram significant for a reduced LVEF of 30-35% with regional wall motion abnormalities. Plan for cardiac catheterization. -Continue oxygen, heparin IV  Moderate late onset Alzheimer's dementia (Tiffany Wiggins) Noted. Delirium risk. Not on medication management. -Delirium precautions  S/P total left hip arthroplasty Management per ortho.  Stage 3a chronic kidney disease (HCC) Baseline creatinine of 1.1-1.2.  Paroxysmal A-fib [facility]iggins) Patient is not on rate/antiarrhythmic medication management as an outpatient. She uses propranolol only as PRN. She is managed on Eliquis 5 mg BID. Transitioned to heparin IV secondary to NSTEMI. Rate controlled and NSR. -Hold Eliquis  Acute systolic heart failure (HCC) No acute symptoms. In setting of NSTEMI. LVEF of 30-35%. -Continue losartan and Lasix  AKI (acute kidney injury) (Tiffany Wiggins) Peak creatinine of 1.55. now trending back towards baseline.  Diabetes mellitus type 2, controlled, with complications (Tiffany Wiggins) Hemoglobin A1C of  5.8%. Diet controlled. Started on SSI while inpatient. -Continue SSI -Carb modified diet    DVT prophylaxis: Heparin IV Code Status:   Code Status: Full Code Family Communication: Husband at bedside Disposition Plan: Discharge pending specialist recommendations/management and PT/OT recommendations.   Consultants:  Orthopedic surgery Cardiology  Procedures:  Transthoracic Echocardiogram (6/10)  Antimicrobials: None    Subjective: Patient reports no issues overnight. Hoping to have Tiffany Wiggins tubing removed. No other concerns.  Objective: BP 115/66   Pulse 61   Temp 98.6 F (37 C) (Oral)   Resp (!) 22   Ht '5\' 6"'$  (1.676 m)   Wt 74.3 kg   SpO2 100%   BMI 26.44 kg/m   Examination:  General exam: Appears calm and comfortable Respiratory system: Clear to auscultation. Respiratory effort normal. Cardiovascular system: S1 & S2 heard, RRR. No murmurs. Gastrointestinal system: Abdomen is nondistended, soft and nontender. Normal bowel sounds heard. Central nervous system: Alert. No focal neurological deficits. Musculoskeletal: No edema. No calf tenderness Skin: No cyanosis. Multiple areas of ecchymosis noted   Data Reviewed: I have personally reviewed following labs and imaging studies  CBC Lab Results  Component Value Date   WBC 9.2 09/10/2021   RBC 2.70 (L) 09/10/2021   HGB 8.7 (L) 09/10/2021   HCT 26.8 (L) 09/10/2021   MCV 99.3 09/10/2021   MCH 32.2 09/10/2021   PLT 127 (L) 09/10/2021   MCHC 32.5 09/10/2021   RDW 12.3 09/10/2021   LYMPHSABS 1.2 09/10/2021   MONOABS 0.6 09/10/2021   EOSABS 0.2 09/10/2021   BASOSABS 0.0 69/62/9528     Last metabolic panel Lab Results  Component Value Date   NA 143 09/09/2021   K 3.8 09/09/2021   CL 107 09/09/2021   CO2 27 09/09/2021   BUN 34 (H) 09/09/2021   CREATININE 1.39 (H) 09/09/2021   GLUCOSE 185 (H) 09/09/2021  GFRNONAA 40 (L) 09/09/2021   GFRAA >60 12/09/2018   CALCIUM 9.4 09/09/2021   PROT 6.4 (L) 12/08/2018    ALBUMIN 3.8 12/08/2018   BILITOT 0.4 12/08/2018   ALKPHOS 54 12/08/2018   AST 21 12/08/2018   ALT 16 12/08/2018   ANIONGAP 9 09/09/2021    GFR: Estimated Creatinine Clearance: 36.6 mL/min (A) (by C-G formula based on SCr of 1.39 mg/dL (H)).  No results found for this or any previous visit (from the past 240 hour(s)).    Radiology Studies: ECHOCARDIOGRAM COMPLETE  Result Date: 09/09/2021    ECHOCARDIOGRAM REPORT   Patient Name:   Tiffany Wiggins Date of Exam: 09/09/2021 Medical Rec #:  [id number]     Height:       66.0 in Accession #:    [id number]    Weight:       163.8 lb Date of Birth:  September 07, 1945    BSA:          1.837 m Patient Age:    22 years      BP:           133/59 mmHg Patient Gender: F             HR:           74 bpm. Exam Location:  Inpatient Procedure: 2D Echo, Cardiac Doppler, Color Doppler and Intracardiac            Opacification Agent Indications:    Elevated troponin  History:        Patient has prior history of Echocardiogram examinations, most                 recent 07/19/2021. Risk Factors:Hypertension and Diabetes.  Sonographer:    Jefferey Pica Referring Phys: [place]  1. Left ventricular ejection fraction, by estimation, is 30 to 35%. The left ventricle has moderately decreased function. The left ventricle demonstrates regional wall motion abnormalities (see scoring diagram/findings for description). There is mild concentric left ventricular hypertrophy. Left ventricular diastolic parameters are indeterminate.  2. Right ventricular systolic function is normal. The right ventricular size is normal. There is normal pulmonary artery systolic pressure.  3. Left atrial size was mildly dilated.  4. The mitral valve is normal in structure. Trivial mitral valve regurgitation. No evidence of mitral stenosis.  5. The aortic valve is grossly normal. Aortic valve regurgitation is not visualized. No aortic stenosis is present.  6. The inferior vena cava is dilated in  size with >50% respiratory variability, suggesting right atrial pressure of 8 mmHg. Comparison(s): Changes from prior study are noted. Conclusion(s)/Recommendation(s): Findings consistent with ischemic cardiomyopathy. New wall motion abnormalities, most severe in lateral wall and with hypokinesis in the inferior wall. Distribution concerning for circumflex ischemia. Findings communicated with Dr. Wynelle Cleveland. FINDINGS  Left Ventricle: Left ventricular ejection fraction, by estimation, is 30 to 35%. The left ventricle has moderately decreased function. The left ventricle demonstrates regional wall motion abnormalities. Definity contrast agent was given IV to delineate the left ventricular endocardial borders. The left ventricular internal cavity size was normal in size. There is mild concentric left ventricular hypertrophy. Left ventricular diastolic parameters are indeterminate.  LV Wall Scoring: The entire lateral wall is akinetic. The entire inferior wall is hypokinetic. The entire anterior wall, entire septum, and apex are normal. Right Ventricle: The right ventricular size is normal. No increase in right ventricular wall thickness. Right ventricular systolic function is normal. There is normal pulmonary artery systolic pressure. The  tricuspid regurgitant velocity is 2.58 m/s, and  with an assumed right atrial pressure of 8 mmHg, the estimated right ventricular systolic pressure is 16.1 mmHg. Left Atrium: Left atrial size was mildly dilated. Right Atrium: Right atrial size was normal in size. Pericardium: There is no evidence of pericardial effusion. Mitral Valve: The mitral valve is normal in structure. Trivial mitral valve regurgitation. No evidence of mitral valve stenosis. Tricuspid Valve: The tricuspid valve is normal in structure. Tricuspid valve regurgitation is mild . No evidence of tricuspid stenosis. Aortic Valve: The aortic valve is grossly normal. Aortic valve regurgitation is not visualized. No aortic  stenosis is present. Aortic valve peak gradient measures 7.6 mmHg. Pulmonic Valve: The pulmonic valve was grossly normal. Pulmonic valve regurgitation is trivial. No evidence of pulmonic stenosis. Aorta: The aortic root, ascending aorta, aortic arch and descending aorta are all structurally normal, with no evidence of dilitation or obstruction. Venous: The inferior vena cava is dilated in size with greater than 50% respiratory variability, suggesting right atrial pressure of 8 mmHg. IAS/Shunts: The atrial septum is grossly normal.  LEFT VENTRICLE PLAX 2D LVIDd:         5.15 cm     Diastology LVIDs:         4.10 cm     LV e' medial:    4.12 cm/s LV PW:         1.25 cm     LV E/e' medial:  17.4 LV IVS:        1.20 cm     LV e' lateral:   3.81 cm/s LVOT diam:     1.85 cm     LV E/e' lateral: 18.8 LV SV:         65 LV SV Index:   36 LVOT Area:     2.69 cm  LV Volumes (MOD) LV vol d, MOD A4C: 86.3 ml LV vol s, MOD A4C: 59.4 ml LV SV MOD A4C:     86.3 ml RIGHT VENTRICLE             IVC RV S prime:     13.00 cm/s  IVC diam: 2.30 cm TAPSE (M-mode): 2.2 cm LEFT ATRIUM             Index        RIGHT ATRIUM           Index LA diam:        3.80 cm 2.07 cm/m   RA Area:     12.30 cm LA Vol (A2C):   48.6 ml 26.45 ml/m  RA Volume:   30.20 ml  16.44 ml/m LA Vol (A4C):   61.2 ml 33.31 ml/m LA Biplane Vol: 56.0 ml 30.48 ml/m  AORTIC VALVE                 PULMONIC VALVE AV Area (Vmax): 2.14 cm     PV Vmax:       0.86 m/s AV Vmax:        138.00 cm/s  PV Peak grad:  2.9 mmHg AV Peak Grad:   7.6 mmHg LVOT Vmax:      110.00 cm/s LVOT Vmean:     73.100 cm/s LVOT VTI:       0.243 m  AORTA Ao Root diam: 3.00 cm Ao Asc diam:  2.70 cm MITRAL VALVE                TRICUSPID VALVE MV Area (PHT): 3.37 cm  TR Peak grad:   26.6 mmHg MV Decel Time: 225 msec     TR Vmax:        258.00 cm/s MV E velocity: 71.60 cm/s MV A velocity: 111.00 cm/s  SHUNTS MV E/A ratio:  0.65         Systemic VTI:  0.24 m                             Systemic Diam:  1.85 cm Buford Dresser MD Electronically signed by Buford Dresser MD Signature Date/Time: 09/09/2021/9:53:38 AM    Final    DG CHEST PORT 1 VIEW  Result Date: 09/09/2021 CLINICAL DATA:  Elevated cardiac enzymes EXAM: PORTABLE CHEST 1 VIEW COMPARISON:  12/08/2018 FINDINGS: Mild cardiomegaly. No confluent airspace opacities or effusions. No acute bony abnormality. IMPRESSION: Cardiomegaly.  No active disease. Electronically Signed   By: Rolm Baptise M.D.   On: 09/09/2021 03:27      LOS: 3 days    Cordelia Poche, MD [facility] 09/10/2021, 8:11 AM   If 7PM-7AM, please contact night-coverage [email or web address]

## 2021-09-10 NOTE — Plan of Care (Signed)
  Problem: Safety: Goal: Non-violent Restraint(s) Outcome: Not Applicable   Problem: Activity: Goal: Ability to avoid complications of mobility impairment will improve Outcome: Not Progressing   Problem: Pain Management: Goal: Pain level will decrease with appropriate interventions Outcome: Not Progressing   Problem: Coping: Goal: Level of anxiety will decrease Outcome: Not Progressing

## 2021-09-10 NOTE — Progress Notes (Signed)
Progress Note  Patient Name: Tiffany Wiggins Date of Encounter: 09/10/2021  [facility] Cardiologist: Jenne Campus, MD   Subjective   Transferred to [facility] overnight. No further chest pain. Husband at bedside, reviewed plans for cath tomorrow. He is amenable. He notes that she is having a hard time not being able to go home, as she had mentally prepared herself for only a brief hospital stay. She was recently diagnosed with mild dementia--functional and appropriate at home.   Inpatient Medications    Scheduled Meds:  amLODipine  5 mg Oral Daily   aspirin EC  81 mg Oral Daily   docusate sodium  100 mg Oral BID   ferrous sulfate  325 mg Oral Daily   insulin aspart  0-15 Units Subcutaneous TID WC   isosorbide mononitrate  60 mg Oral Daily   losartan  50 mg Oral Daily   mouth rinse  15 mL Mouth Rinse BID   metoprolol tartrate  12.5 mg Oral BID   rosuvastatin  20 mg Oral Daily   Continuous Infusions:  sodium chloride Stopped (09/07/21 1533)   heparin 900 Units/hr (09/10/21 0458)   methocarbamol (ROBAXIN) IV     PRN Meds: acetaminophen, bisacodyl, melatonin, menthol-cetylpyridinium **OR** phenol, methocarbamol **OR** methocarbamol (ROBAXIN) IV, metoCLOPramide **OR** metoCLOPramide (REGLAN) injection, naLOXone (NARCAN)  injection, naloxone, nitroGLYCERIN, ondansetron **OR** ondansetron (ZOFRAN) IV, polyethylene glycol   Vital Signs    Vitals:   09/09/21 2302 09/10/21 0300 09/10/21 0446 09/10/21 0716  BP: (!) 113/57 (!) 115/49 116/60 115/66  Pulse: 72 60 65 61  Resp: '19 19 19 '$ (!) 22  Temp: 98.2 F (36.8 C)  98.1 F (36.7 C) 98.6 F (37 C)  TempSrc: Oral  Oral Oral  SpO2: 99% 100% 99% 100%  Weight:      Height:        Intake/Output Summary (Last 24 hours) at 09/10/2021 1121 Last data filed at 09/10/2021 0900 Gross per 24 hour  Intake 235.94 ml  Output 200 ml  Net 35.94 ml      09/06/2021    2:00 PM 09/06/2021    9:46 AM 08/24/2021    1:59 PM  Last 3 Weights   Weight (lbs) 163 lb 12.8 oz 164 lb 164 lb  Weight (kg) 74.3 kg 74.39 kg 74.39 kg      Telemetry    SR with 9 seconds of SVT this AM - Personally Reviewed  ECG    SR, similar borderline ST changes in inferolateral leads compared to recent - Personally Reviewed  Physical Exam   GEN: No acute distress.   Neck: No JVD Cardiac: RRR, no murmurs, rubs, or gallops.  Respiratory: Clear to auscultation bilaterally. GI: Soft, nontender, non-distended  MS: No edema; No deformity. Neuro:  Nonfocal  Psych: Sleepy, but awakens to voice and answers questions appropriately  Labs    High Sensitivity Troponin:   Recent Labs  Lab 09/09/21 0041 09/09/21 0318  TROPONINIHS >24,000* >24,000*     Chemistry Recent Labs  Lab 09/07/21 0343 09/09/21 0041  NA 141 143  K 4.7 3.8  CL 103 107  CO2 28 27  GLUCOSE 153* 185*  BUN 32* 34*  CREATININE 1.55* 1.39*  CALCIUM 9.4 9.4  GFRNONAA 35* 40*  ANIONGAP 10 9    Lipids  Recent Labs  Lab 09/09/21 0318  CHOL 159  TRIG 71  HDL 81  LDLCALC 64  CHOLHDL 2.0    Hematology Recent Labs  Lab 09/07/21 0343 09/08/21 0338 09/10/21 0544  WBC 18.1* 18.9* 9.2  RBC 3.46* 3.24* 2.70*  HGB 11.3* 10.6* 8.7*  HCT 35.7* 32.9* 26.8*  MCV 103.2* 101.5* 99.3  MCH 32.7 32.7 32.2  MCHC 31.7 32.2 32.5  RDW 12.1 12.2 12.3  PLT 140* 164 127*   Thyroid No results for input(s): "TSH", "FREET4" in the last 168 hours.  BNPNo results for input(s): "BNP", "PROBNP" in the last 168 hours.  DDimer No results for input(s): "DDIMER" in the last 168 hours.   Radiology    ECHOCARDIOGRAM COMPLETE  Result Date: 09/09/2021    ECHOCARDIOGRAM REPORT   Patient Name:   Tiffany Wiggins Date of Exam: 09/09/2021 Medical Rec #:  [id number]     Height:       66.0 in Accession #:    [id number]    Weight:       163.8 lb Date of Birth:  28-Feb-1946    BSA:          1.837 m Patient Age:    76 years      BP:           133/59 mmHg Patient Gender: F             HR:           74 bpm.  Exam Location:  Inpatient Procedure: 2D Echo, Cardiac Doppler, Color Doppler and Intracardiac            Opacification Agent Indications:    Elevated troponin  History:        Patient has prior history of Echocardiogram examinations, most                 recent 07/19/2021. Risk Factors:Hypertension and Diabetes.  Sonographer:    Jefferey Pica Referring Phys: [place]  1. Left ventricular ejection fraction, by estimation, is 30 to 35%. The left ventricle has moderately decreased function. The left ventricle demonstrates regional wall motion abnormalities (see scoring diagram/findings for description). There is mild concentric left ventricular hypertrophy. Left ventricular diastolic parameters are indeterminate.  2. Right ventricular systolic function is normal. The right ventricular size is normal. There is normal pulmonary artery systolic pressure.  3. Left atrial size was mildly dilated.  4. The mitral valve is normal in structure. Trivial mitral valve regurgitation. No evidence of mitral stenosis.  5. The aortic valve is grossly normal. Aortic valve regurgitation is not visualized. No aortic stenosis is present.  6. The inferior vena cava is dilated in size with >50% respiratory variability, suggesting right atrial pressure of 8 mmHg. Comparison(s): Changes from prior study are noted. Conclusion(s)/Recommendation(s): Findings consistent with ischemic cardiomyopathy. New wall motion abnormalities, most severe in lateral wall and with hypokinesis in the inferior wall. Distribution concerning for circumflex ischemia. Findings communicated with Dr. Wynelle Cleveland. FINDINGS  Left Ventricle: Left ventricular ejection fraction, by estimation, is 30 to 35%. The left ventricle has moderately decreased function. The left ventricle demonstrates regional wall motion abnormalities. Definity contrast agent was given IV to delineate the left ventricular endocardial borders. The left ventricular internal cavity size  was normal in size. There is mild concentric left ventricular hypertrophy. Left ventricular diastolic parameters are indeterminate.  LV Wall Scoring: The entire lateral wall is akinetic. The entire inferior wall is hypokinetic. The entire anterior wall, entire septum, and apex are normal. Right Ventricle: The right ventricular size is normal. No increase in right ventricular wall thickness. Right ventricular systolic function is normal. There is normal pulmonary artery systolic pressure. The tricuspid regurgitant  velocity is 2.58 m/s, and  with an assumed right atrial pressure of 8 mmHg, the estimated right ventricular systolic pressure is 78.2 mmHg. Left Atrium: Left atrial size was mildly dilated. Right Atrium: Right atrial size was normal in size. Pericardium: There is no evidence of pericardial effusion. Mitral Valve: The mitral valve is normal in structure. Trivial mitral valve regurgitation. No evidence of mitral valve stenosis. Tricuspid Valve: The tricuspid valve is normal in structure. Tricuspid valve regurgitation is mild . No evidence of tricuspid stenosis. Aortic Valve: The aortic valve is grossly normal. Aortic valve regurgitation is not visualized. No aortic stenosis is present. Aortic valve peak gradient measures 7.6 mmHg. Pulmonic Valve: The pulmonic valve was grossly normal. Pulmonic valve regurgitation is trivial. No evidence of pulmonic stenosis. Aorta: The aortic root, ascending aorta, aortic arch and descending aorta are all structurally normal, with no evidence of dilitation or obstruction. Venous: The inferior vena cava is dilated in size with greater than 50% respiratory variability, suggesting right atrial pressure of 8 mmHg. IAS/Shunts: The atrial septum is grossly normal.  LEFT VENTRICLE PLAX 2D LVIDd:         5.15 cm     Diastology LVIDs:         4.10 cm     LV e' medial:    4.12 cm/s LV PW:         1.25 cm     LV E/e' medial:  17.4 LV IVS:        1.20 cm     LV e' lateral:   3.81 cm/s  LVOT diam:     1.85 cm     LV E/e' lateral: 18.8 LV SV:         65 LV SV Index:   36 LVOT Area:     2.69 cm  LV Volumes (MOD) LV vol d, MOD A4C: 86.3 ml LV vol s, MOD A4C: 59.4 ml LV SV MOD A4C:     86.3 ml RIGHT VENTRICLE             IVC RV S prime:     13.00 cm/s  IVC diam: 2.30 cm TAPSE (M-mode): 2.2 cm LEFT ATRIUM             Index        RIGHT ATRIUM           Index LA diam:        3.80 cm 2.07 cm/m   RA Area:     12.30 cm LA Vol (A2C):   48.6 ml 26.45 ml/m  RA Volume:   30.20 ml  16.44 ml/m LA Vol (A4C):   61.2 ml 33.31 ml/m LA Biplane Vol: 56.0 ml 30.48 ml/m  AORTIC VALVE                 PULMONIC VALVE AV Area (Vmax): 2.14 cm     PV Vmax:       0.86 m/s AV Vmax:        138.00 cm/s  PV Peak grad:  2.9 mmHg AV Peak Grad:   7.6 mmHg LVOT Vmax:      110.00 cm/s LVOT Vmean:     73.100 cm/s LVOT VTI:       0.243 m  AORTA Ao Root diam: 3.00 cm Ao Asc diam:  2.70 cm MITRAL VALVE                TRICUSPID VALVE MV Area (PHT): 3.37 cm     TR  Peak grad:   26.6 mmHg MV Decel Time: 225 msec     TR Vmax:        258.00 cm/s MV E velocity: 71.60 cm/s MV A velocity: 111.00 cm/s  SHUNTS MV E/A ratio:  0.65         Systemic VTI:  0.24 m                             Systemic Diam: 1.85 cm Buford Dresser MD Electronically signed by Buford Dresser MD Signature Date/Time: 09/09/2021/9:53:38 AM    Final    DG CHEST PORT 1 VIEW  Result Date: 09/09/2021 CLINICAL DATA:  Elevated cardiac enzymes EXAM: PORTABLE CHEST 1 VIEW COMPARISON:  12/08/2018 FINDINGS: Mild cardiomegaly. No confluent airspace opacities or effusions. No acute bony abnormality. IMPRESSION: Cardiomegaly.  No active disease. Electronically Signed   By: Rolm Baptise M.D.   On: 09/09/2021 03:27    Cardiac Studies   Echo 09/09/21 1. Left ventricular ejection fraction, by estimation, is 30 to 35%. The  left ventricle has moderately decreased function. The left ventricle  demonstrates regional wall motion abnormalities (see scoring   diagram/findings for description). There is mild  concentric left ventricular hypertrophy. Left ventricular diastolic  parameters are indeterminate.   2. Right ventricular systolic function is normal. The right ventricular  size is normal. There is normal pulmonary artery systolic pressure.   3. Left atrial size was mildly dilated.   4. The mitral valve is normal in structure. Trivial mitral valve  regurgitation. No evidence of mitral stenosis.   5. The aortic valve is grossly normal. Aortic valve regurgitation is not  visualized. No aortic stenosis is present.   6. The inferior vena cava is dilated in size with >50% respiratory  variability, suggesting right atrial pressure of 8 mmHg.   LV Wall Scoring:  The entire lateral wall is akinetic. The entire inferior wall is  hypokinetic.  The entire anterior wall, entire septum, and apex are normal.   Comparison(s): Changes from prior study are noted.   Conclusion(s)/Recommendation(s): Findings consistent with ischemic  cardiomyopathy. New wall motion abnormalities, most severe in lateral wall  and with hypokinesis in the inferior wall. Distribution concerning for  circumflex ischemia. Findings  communicated with Dr. Wynelle Cleveland.    MYOVIEW: 08/15/2021   Findings are consistent with no  ischemia and no prior myocardial infarction. The study is low risk.   No ST deviation was noted.   Left ventricular function is normal. Nuclear stress EF: 78 %. The left ventricular ejection fraction is hyperdynamic (>65%). End diastolic cavity size is normal.   Prior study available for comparison from 06/17/2020.   ECHO: 07/19/2021  1. Left ventricular ejection fraction, by estimation, is 60 to 65%. The  left ventricle has normal function. The left ventricle has no regional  wall motion abnormalities. There is moderate concentric left ventricular  hypertrophy. Left ventricular  diastolic parameters are consistent with Grade I diastolic dysfunction   (impaired relaxation). The average left ventricular global longitudinal  strain is -18.7 %.   2. Right ventricular systolic function is normal. The right ventricular  size is normal.   3. The mitral valve is normal in structure. No evidence of mitral valve  regurgitation. No evidence of mitral stenosis.   4. The aortic valve is tricuspid. Aortic valve regurgitation is not  visualized. No aortic stenosis is present.   5. The inferior vena cava is normal in size with  greater than 50%  respiratory variability, suggesting right atrial pressure of 3 mmHg.    CARDIAC CT: 12/09/2018 Calcium Score: 482 Agatston units.   Coronary Arteries: Left dominant with no anomalies   LM: Calcified plaque at the ostium, mild (<50%) stenosis.   LAD system: Calcified plaque proximal LAD, mild (<50%) stenosis. Calcified plaque mid LAD, probably mild (<50%) stenosis but difficult to tell with blooming artifact.   Circumflex system: Moderate ramus. Mixed plaque proximally, mild (<50%) stenosis. Calcified plaque with up to 50% stenosis in the mid ramus. Large, dominant LCx providing left PDA. Calcified plaque proximal LCx, mild <50% stenosis. Calcified plaque distal LCx, mild <50% stenosis). PDA is not well-visualized due to motion artifact but I do not think that there is significant disease.   RCA system: Small, nondominant RCA. Calcified plaque proximally, up to 50% stenosis.   IMPRESSION: 1. Coronary artery calcium score 482 Agatston units. This places the patient in the 91st percentile for age and gender, suggesting high risk for future cardiac events.   2. Extensive calcified plaque. There does not appear to be critical stenosis present in any of the vessels, but will send for FFR to confirm.   Loralie Champagne  Patient Profile     76 y.o. female with a hx of non-obs CAD by cardiac CT 2020, nl MV 07/2021, CVA, DM2, HTN, HLD, IBS, PUD, who is being seen for the evaluation of elevated troponin  at the request of Dr Wynelle Cleveland.  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 hsTn x2 >24,000 -CT with Ca score 482, noted extensive calcified plaque in all coronary vessels. FFR showed that distal LAD FFR was 0.64, but it was unclear if this was due to stenosis in mid LAD or due to gradual narrowing due to extensive plaque. -on my review of FFR, the pLAD was 0.90, and the mid was 0.73, with a delta of 0.17. This is borderline. However, stress test done last month shows no evidence of ischemia, which was reassuring. There was a fixed mild defect in the mid anterior wall -prior echo with normal function/wall motion. Echo this admission pending -LDL 64, on rosuvastatin 5 mg daily. Increased this to high intensity statin 20 mg dose. -changed from PRN propranolol to low dose metoprolol. Had brief SVT this AM, asymptomatic. May be limited on beta blocker dosing as baseline HR in the 60s -on imdur chronically. Has PRN nitro as well -continue 81 mg aspirin daily -on outpatient apixaban, received 2 doses of 2.5 mg on 6/9. Has been started on IV heparin -echo as above, new wall motion abnormalities and reduced EF. On low dose beta blocker and losartan. Consider entresto and SLGT2i based on BP and results of cath  I discussed cath again with patient and husband at bedside. Plan is for tomorrow. I did discuss possible outcomes, including medical management, PCI, or referral for bypass surgery. Husband is hopeful for anything but bypass surgery so she can go home soon, but he is amenable to speaking with surgery and considering if that is the recommendation.   Risks and benefits of cardiac catheterization have been discussed with the patient.  These include bleeding, infection, kidney damage, stroke, heart attack, death.  The patient understands these risks and is willing to proceed.     Paroxysmal atrial fib pSVT -CHA2DS2/VAS Stroke Risk Points=7 -on apixaban as outpatient, now on heparin as above  History of CVA Type II diabetes Hypertension Hyperlipidemia -lipids as above -last A1c 5.8 -on losartan 50 mg daily, imdur 60 mg daily, amlodipine 5 mg daily -on lasix 20 mg daily as an outpatient, holding   S/P L THR History of PUD -Hgb down today but no apparent blood loss. Will recheck this PM. -Recent baseline Hgb 10-11  For questions or updates, please contact [place] Please consult [email or web address] for contact info under     Signed, Buford Dresser, MD  09/10/2021, 11:21 AM

## 2021-09-10 NOTE — Assessment & Plan Note (Signed)
No acute symptoms. In setting of NSTEMI. LVEF of 30-35%. -Continue losartan and Lasix

## 2021-09-10 NOTE — Assessment & Plan Note (Addendum)
Initial peak of 1.55 with improvement back towards baseline. Creatinine of 1.71 on day of discharged. Discussed with cardiology who recommended continuing losartan and repeating BMP in two days. Discussed with patient.

## 2021-09-10 NOTE — Progress Notes (Signed)
ANTICOAGULATION CONSULT NOTE - Initial Consult  Pharmacy Consult for heparin Indication: chest pain/ACS and atrial fibrillation  Allergies  Allergen Reactions   Codeine Nausea Only   Morphine And Related Itching    Patient Measurements: Height: '5\' 6"'$  (167.6 cm) Weight: 74.3 kg (163 lb 12.8 oz) IBW/kg (Calculated) : 59.3 Heparin Dosing Weight: 74.2 kg  Vital Signs: Temp: 98.1 F (36.7 C) (06/11 0446) Temp Source: Oral (06/11 0446) BP: 116/60 (06/11 0446) Pulse Rate: 65 (06/11 0446)  Labs: Recent Labs    09/08/21 0338 09/09/21 0041 09/09/21 0042 09/09/21 0318 09/09/21 1120 09/10/21 0544  HGB 10.6*  --   --   --   --  8.7*  HCT 32.9*  --   --   --   --  26.8*  PLT 164  --   --   --   --  127*  APTT  --  33  --   --  78* 90*  HEPARINUNFRC  --   --  >1.10*  --   --  >1.10*  CREATININE  --  1.39*  --   --   --   --   CKTOTAL  --   --   --  2,738*  --   --   CKMB  --   --   --  193.5*  --   --   TROPONINIHS  --  >24,000*  --  >24,000*  --   --     Estimated Creatinine Clearance: 36.6 mL/min (A) (by C-G formula based on SCr of 1.39 mg/dL (H)).  Medical History: Past Medical History:  Diagnosis Date   Anxiety    Arthritis    CVA (cerebral vascular accident) ([place]) 2019   Diabetes mellitus type 2 in nonobese ([place])    Hyperlipidemia    Hypertension    PCP GAVE FOR HER  BLOOD PRESSURE   IBS (irritable bowel syndrome)    Memory deficit    PUD (peptic ulcer disease)    TIA (transient ischemic attack) 2006    Medications:  Infusions:   sodium chloride Stopped (09/07/21 1533)   heparin 900 Units/hr (09/10/21 0458)   methocarbamol (ROBAXIN) IV      Assessment: 76 yo F admitted for left THA with h/o afib.  PTA Eliquis resumed 06/08 1 day post op at 2.5 mg BID with plans to increase to PTA dose of 5 mg BID 06/10.  Developed CP 06/10 with trop x2 > 24,000, pre-op ischemic work up negative.  Pharmacy consulted for heparin dosing.  Last dose of Eliquis 06/09 '@21'$ :09.   Will monitor aPTT until Eliquis no longer interfering with anti-Xa activity.  aPTT of 90 sec is therapeutic on 900 units/hr; anti-Xa level still >1.1.  CBC trending down, Hgb 10.6 > 8.7, pltc 164 > 127.  No bleeding or infusion problems per RN.   Goal of Therapy:  Heparin level 0.3-0.7 units/ml aPTT 66-102 seconds Monitor platelets by anticoagulation protocol: Yes   Plan:  Continue heparin infusion '@900'$  units/hr Daily CBC, heparin level Monitor for s/sx of bleeding  Laurey Arrow, PharmD PGY1 Pharmacy Resident 09/10/2021  6:58 AM  Please check [email or web address] for unit-specific pharmacy phone numbers.

## 2021-09-10 NOTE — Evaluation (Signed)
Physical Therapy Re-Evaluation Patient Details Name: Tiffany Wiggins MRN: [id number] DOB: 22-Dec-1945 Today's Date: 09/10/2021  History of Present Illness  Pt is a 76 yo female presenting 09/06/21 for L anterior approach THA. Rapid Response called for decreased level of consciousness 6/8 and called for chest pain 6/9. Troponin >24,000 without ischemic changes on EKG consistent with post-operative NSTEMI. Transferred to [facility] 6/10. Plan for cardiac cath 6/12. PMH: hx of CVA, DM, HLD, HTN, IBS, hx of Tia, lumbar L4-L5 decompression, adominal exploratory laparotomy 2017, loop recorder implant 2015, PAF, Alzheimer's dementia.   Clinical Impression  Pt presents with condition above and deficits mentioned below, see PT Problem List. PTA, she was independent, primarily without and AD for mobility, living with her husband in a 1-level house with 3 STE. Pt has some memory deficits with hx of Alzheimer's dementia at baseline, often forgetting why her L hip hurt today. Currently, pt is requiring minA for bed mobility and transfers, but progressed from needing minA to min guard assist for gait with RW and for a few steps without UE support today. Pt demonstrates deficits in cognition, balance, activity tolerance, and L leg AROM, primarily secondary to pain and edema. Will continue to follow acutely.     Recommendations for follow up therapy are one component of a multi-disciplinary discharge planning process, led by the attending physician.  Recommendations may be updated based on patient status, additional functional criteria and insurance authorization.  Follow Up Recommendations Follow physician's recommendations for discharge plan and follow up therapies    Assistance Recommended at Discharge Frequent or constant Supervision/Assistance  Patient can return home with the following  A little help with walking and/or transfers;A little help with bathing/dressing/bathroom;Assistance with cooking/housework;Assist for  transportation;Help with stairs or ramp for entrance;Direct supervision/assist for medications management;Direct supervision/assist for financial management    Equipment Recommendations BSC/3in1 (or shower chair)  Recommendations for Other Services       Functional Status Assessment Patient has had a recent decline in their functional status and demonstrates the ability to make significant improvements in function in a reasonable and predictable amount of time.     Precautions / Restrictions Precautions Precautions: Fall Restrictions Weight Bearing Restrictions: Yes LLE Weight Bearing: Weight bearing as tolerated      Mobility  Bed Mobility Overal bed mobility: Needs Assistance Bed Mobility: Supine to Sit, Sit to Supine     Supine to sit: Min assist, HOB elevated Sit to supine: Min assist, HOB elevated   General bed mobility comments: MinA to ascend trunk and scoot to edge of bed with transition supine > sit, cuing for leg management and grabbing bed rails with UEs to pull up to sit. MinA to manage L leg onto bed with return to supine, HOB elevated.    Transfers Overall transfer level: Needs assistance Equipment used: Rolling walker (2 wheels) Transfers: Sit to/from Stand Sit to Stand: Min assist           General transfer comment: MinA to power up to stand from EOB, cuing pt to push up with at least 1 hand on bed.    Ambulation/Gait Ambulation/Gait assistance: Min guard, Min assist Gait Distance (Feet): 300 Feet Assistive device: Rolling walker (2 wheels), None Gait Pattern/deviations: Step-through pattern, Decreased weight shift to left, Decreased stride length Gait velocity: reduced Gait velocity interpretation: <1.31 ft/sec, indicative of household ambulator   General Gait Details: Pt with slow, but mostly steady gait, initially needing minA for stability but quickly progressed to min guard assist  with no LOB. Pt took a few steps end of gait bout without UE  support, no LOB.  Stairs            Wheelchair Mobility    Modified Rankin (Stroke Patients Only)       Balance Overall balance assessment: Needs assistance Sitting-balance support: Feet supported, No upper extremity supported Sitting balance-Leahy Scale: Fair     Standing balance support: During functional activity, No upper extremity supported, Bilateral upper extremity supported Standing balance-Leahy Scale: Fair Standing balance comment: Able to stand without UE support and take a few steps, benefits from RW though for improved stability                             Pertinent Vitals/Pain Pain Assessment Pain Assessment: Faces Faces Pain Scale: Hurts little more Pain Location: left hip Pain Descriptors / Indicators: Operative site guarding, Discomfort, Grimacing Pain Intervention(s): Limited activity within patient's tolerance, Monitored during session, Repositioned    Home Living Family/patient expects to be discharged to:: Private residence Living Arrangements: Spouse/significant other Available Help at Discharge: Family;Available 24 hours/day Type of Home: House Home Access: Stairs to enter Entrance Stairs-Rails: Left Entrance Stairs-Number of Steps: 3   Home Layout: One level Home Equipment: Animator (2 wheels);Cane - single point;Grab bars - tub/shower Additional Comments: Pt's husband [name] reporting the bathroom door/entry is 21" wide, is concerned about safe mobility patterns to get inside.    Prior Function Prior Level of Function : Independent/Modified Independent             Mobility Comments: Assistance to rise from toilet, uses RW during painful periods, but otherwise was IND without AD ADLs Comments: ind     Hand Dominance        Extremity/Trunk Assessment   Upper Extremity Assessment Upper Extremity Assessment: Defer to OT evaluation    Lower Extremity Assessment Lower Extremity Assessment: LLE  deficits/detail LLE Deficits / Details: Hip pain limiting hip AROM    Cervical / Trunk Assessment Cervical / Trunk Assessment: Kyphotic  Communication   Communication: No difficulties  Cognition Arousal/Alertness: Awake/alert Behavior During Therapy: WFL for tasks assessed/performed, Flat affect Overall Cognitive Status: History of cognitive impairments - at baseline                                 General Comments: Pt has history of stroke, dementia, poor short-term memory, often asking about the L hip pain and bandage, unaware she had THA. Follows commands appropriately        General Comments General comments (skin integrity, edema, etc.): VSS on 2L O2    Exercises     Assessment/Plan    PT Assessment Patient needs continued PT services  PT Problem List Decreased strength;Decreased range of motion;Decreased activity tolerance;Decreased balance;Decreased mobility;Decreased coordination;Decreased cognition;Decreased knowledge of use of DME;Pain;Cardiopulmonary status limiting activity       PT Treatment Interventions DME instruction;Gait training;Stair training;Functional mobility training;Therapeutic activities;Therapeutic exercise;Balance training;Neuromuscular re-education;Patient/family education;Cognitive remediation    PT Goals (Current goals can be found in the Care Plan section)  Acute Rehab PT Goals Patient Stated Goal: To walk without pain PT Goal Formulation: With patient/family Time For Goal Achievement: 09/24/21 Potential to Achieve Goals: Good    Frequency 7X/week     Co-evaluation               AM-PAC PT "6 Clicks"  Mobility  Outcome Measure Help needed turning from your back to your side while in a flat bed without using bedrails?: A Little Help needed moving from lying on your back to sitting on the side of a flat bed without using bedrails?: A Little Help needed moving to and from a bed to a chair (including a wheelchair)?: A  Little Help needed standing up from a chair using your arms (e.g., wheelchair or bedside chair)?: A Little Help needed to walk in hospital room?: A Little Help needed climbing 3-5 steps with a railing? : A Little 6 Click Score: 18    End of Session Equipment Utilized During Treatment: Gait belt Activity Tolerance: Patient tolerated treatment well Patient left: with family/visitor present;in bed;with bed alarm set;with call bell/phone within reach Nurse Communication: Mobility status PT Visit Diagnosis: Difficulty in walking, not elsewhere classified (R26.2);Pain;Unsteadiness on feet (R26.81);Other abnormalities of gait and mobility (R26.89);Muscle weakness (generalized) (M62.81) Pain - Right/Left: Left Pain - part of body: Hip    Time: 2229-7989 PT Time Calculation (min) (ACUTE ONLY): 39 min   Charges:   PT Evaluation $PT Re-evaluation: 1 Re-eval PT Treatments $Gait Training: 8-22 mins $Therapeutic Activity: 8-22 mins        Moishe Spice, PT, DPT Acute Rehabilitation Services  Office: [place] 09/10/2021, 5:47 PM

## 2021-09-10 NOTE — Assessment & Plan Note (Signed)
Hemoglobin A1C of 5.8%. Diet controlled. Started on SSI while inpatient. -Continue SSI -Carb modified diet

## 2021-09-10 NOTE — Plan of Care (Signed)
  Problem: Education: Goal: Knowledge of the prescribed therapeutic regimen will improve Outcome: Progressing   Problem: Activity: Goal: Ability to avoid complications of mobility impairment will improve Outcome: Progressing   Problem: Clinical Measurements: Goal: Postoperative complications will be avoided or minimized Outcome: Progressing   Problem: Pain Management: Goal: Pain level will decrease with appropriate interventions Outcome: Progressing   Problem: Skin Integrity: Goal: Will show signs of wound healing Outcome: Progressing   Problem: Education: Goal: Knowledge of General Education information will improve Description: Including pain rating scale, medication(s)/side effects and non-pharmacologic comfort measures Outcome: Progressing

## 2021-09-11 ENCOUNTER — Encounter: Payer: Self-pay | Admitting: Internal Medicine

## 2021-09-11 ENCOUNTER — Encounter (HOSPITAL_COMMUNITY)
Admission: AD | Disposition: A | Discharge status: Home / Self Care | Payer: Self-pay | Source: Home / Self Care | Attending: Orthopedic Surgery

## 2021-09-11 DIAGNOSIS — I214 Non-ST elevation (NSTEMI) myocardial infarction: Secondary | ICD-10-CM | POA: Diagnosis not present

## 2021-09-11 DIAGNOSIS — I480 Paroxysmal atrial fibrillation: Secondary | ICD-10-CM | POA: Diagnosis not present

## 2021-09-11 DIAGNOSIS — I471 Supraventricular tachycardia: Secondary | ICD-10-CM

## 2021-09-11 DIAGNOSIS — I429 Cardiomyopathy, unspecified: Secondary | ICD-10-CM | POA: Diagnosis not present

## 2021-09-11 DIAGNOSIS — Z96642 Presence of left artificial hip joint: Secondary | ICD-10-CM | POA: Diagnosis not present

## 2021-09-11 HISTORY — PX: LEFT HEART CATH AND CORONARY ANGIOGRAPHY: CATH118249

## 2021-09-11 LAB — BASIC METABOLIC PANEL
Anion gap: 11 (ref 5–15)
BUN: 37 mg/dL — ABNORMAL HIGH (ref 8–23)
CO2: 23 mmol/L (ref 22–32)
Calcium: 8.6 mg/dL — ABNORMAL LOW (ref 8.9–10.3)
Chloride: 107 mmol/L (ref 98–111)
Creatinine, Ser: 1.48 mg/dL — ABNORMAL HIGH (ref 0.44–1.00)
GFR, Estimated: 37 mL/min — ABNORMAL LOW (ref >60)
Glucose, Bld: 126 mg/dL — ABNORMAL HIGH (ref 70–99)
Potassium: 3.4 mmol/L — ABNORMAL LOW (ref 3.5–5.1)
Sodium: 141 mmol/L (ref 135–145)

## 2021-09-11 LAB — CBC
HCT: 27 % — ABNORMAL LOW (ref 36.0–46.0)
Hemoglobin: 8.7 g/dL — ABNORMAL LOW (ref 12.0–15.0)
MCH: 32.3 pg (ref 26.0–34.0)
MCHC: 32.2 g/dL (ref 30.0–36.0)
MCV: 100.4 fL — ABNORMAL HIGH (ref 80.0–100.0)
Platelets: 126 10*3/uL — ABNORMAL LOW (ref 150–400)
RBC: 2.69 MIL/uL — ABNORMAL LOW (ref 3.87–5.11)
RDW: 12.1 % (ref 11.5–15.5)
WBC: 8.9 10*3/uL (ref 4.0–10.5)
nRBC: 0 % (ref 0.0–0.2)

## 2021-09-11 LAB — GLUCOSE, CAPILLARY
Glucose-Capillary: 134 mg/dL — ABNORMAL HIGH (ref 70–99)
Glucose-Capillary: 133 mg/dL — ABNORMAL HIGH (ref 70–99)
Glucose-Capillary: 141 mg/dL — ABNORMAL HIGH (ref 70–99)

## 2021-09-11 LAB — APTT: aPTT: 52 seconds — ABNORMAL HIGH (ref 24–36)

## 2021-09-11 LAB — HEPARIN LEVEL (UNFRACTIONATED): Heparin Unfractionated: >1.1 IU/mL — ABNORMAL HIGH (ref 0.30–0.70)

## 2021-09-11 SURGERY — LEFT HEART CATH AND CORONARY ANGIOGRAPHY
Anesthesia: LOCAL

## 2021-09-11 MED ORDER — LIDOCAINE HCL (PF) 1 % IJ SOLN
INTRAMUSCULAR | Status: AC
  Filled 2021-09-11: qty 30

## 2021-09-11 MED ORDER — HYDRALAZINE HCL 20 MG/ML IJ SOLN: 10.0000 mg | INTRAMUSCULAR | Status: AC | PRN

## 2021-09-11 MED ORDER — SODIUM CHLORIDE 0.9% FLUSH
3.0000 mL | Freq: Two times a day (BID) | INTRAVENOUS | Status: DC
  Administered 2021-09-12: 3 mL via INTRAVENOUS

## 2021-09-11 MED ORDER — LIP MEDEX EX OINT: TOPICAL_OINTMENT | CUTANEOUS | Status: DC | PRN

## 2021-09-11 MED ORDER — LIDOCAINE HCL (PF) 1 % IJ SOLN
INTRAMUSCULAR | Status: DC | PRN
  Administered 2021-09-11: 2 mL

## 2021-09-11 MED ORDER — POTASSIUM CHLORIDE CRYS ER 20 MEQ PO TBCR
40.0000 meq | EXTENDED_RELEASE_TABLET | Freq: Once | ORAL | Status: AC
  Administered 2021-09-11: 40 meq via ORAL
  Filled 2021-09-11: qty 2

## 2021-09-11 MED ORDER — HEPARIN SODIUM (PORCINE) 1000 UNIT/ML IJ SOLN
INTRAMUSCULAR | Status: AC
  Filled 2021-09-11: qty 10

## 2021-09-11 MED ORDER — HEPARIN (PORCINE) IN NACL 1000-0.9 UT/500ML-% IV SOLN
INTRAVENOUS | Status: DC | PRN
  Administered 2021-09-11 (×2): 500 mL

## 2021-09-11 MED ORDER — MIDAZOLAM HCL 2 MG/2ML IJ SOLN
INTRAMUSCULAR | Status: DC | PRN
  Administered 2021-09-11: .5 mg via INTRAVENOUS

## 2021-09-11 MED ORDER — MIDAZOLAM HCL 2 MG/2ML IJ SOLN
INTRAMUSCULAR | Status: AC
  Filled 2021-09-11: qty 2

## 2021-09-11 MED ORDER — IOHEXOL 350 MG/ML SOLN
INTRAVENOUS | Status: DC | PRN
  Administered 2021-09-11: 30 mL

## 2021-09-11 MED ORDER — FENTANYL CITRATE (PF) 100 MCG/2ML IJ SOLN
INTRAMUSCULAR | Status: DC | PRN
Start: 2021-09-11 — End: 2021-09-11
  Administered 2021-09-11: 12.5 ug via INTRAVENOUS

## 2021-09-11 MED ORDER — LABETALOL HCL 5 MG/ML IV SOLN: 10.0000 mg | INTRAVENOUS | Status: AC | PRN

## 2021-09-11 MED ORDER — SODIUM CHLORIDE 0.9 % IV SOLN
250.0000 mL | INTRAVENOUS | Status: DC | PRN
Start: 2021-09-11 — End: 2021-09-12

## 2021-09-11 MED ORDER — FENTANYL CITRATE (PF) 100 MCG/2ML IJ SOLN
INTRAMUSCULAR | Status: AC
  Filled 2021-09-11: qty 2

## 2021-09-11 MED ORDER — HEPARIN SODIUM (PORCINE) 1000 UNIT/ML IJ SOLN
INTRAMUSCULAR | Status: DC | PRN
  Administered 2021-09-11: 3500 [IU] via INTRAVENOUS

## 2021-09-11 MED ORDER — METOPROLOL SUCCINATE ER 25 MG PO TB24
12.5000 mg | ORAL_TABLET | Freq: Every day | ORAL | Status: DC
  Administered 2021-09-11 – 2021-09-12 (×2): 12.5 mg via ORAL
  Filled 2021-09-11 (×2): qty 1

## 2021-09-11 MED ORDER — LORAZEPAM 2 MG/ML IJ SOLN
1.0000 mg | INTRAMUSCULAR | Status: DC | PRN
Start: 2021-09-11 — End: 2021-09-12
  Administered 2021-09-11: 1 mg via INTRAVENOUS
  Filled 2021-09-11: qty 1

## 2021-09-11 MED ORDER — HEPARIN (PORCINE) IN NACL 1000-0.9 UT/500ML-% IV SOLN
INTRAVENOUS | Status: AC
  Filled 2021-09-11: qty 1000

## 2021-09-11 MED ORDER — VERAPAMIL HCL 2.5 MG/ML IV SOLN
INTRAVENOUS | Status: AC
  Filled 2021-09-11: qty 2

## 2021-09-11 MED ORDER — WHITE PETROLATUM EX OINT
TOPICAL_OINTMENT | CUTANEOUS | Status: DC | PRN
Start: 2021-09-11 — End: 2021-09-11
  Filled 2021-09-11: qty 28.35

## 2021-09-11 MED ORDER — SODIUM CHLORIDE 0.9 % IV SOLN: INTRAVENOUS | Status: AC

## 2021-09-11 MED ORDER — VERAPAMIL HCL 2.5 MG/ML IV SOLN
INTRAVENOUS | Status: DC | PRN
  Administered 2021-09-11 (×2): 10 mL via INTRA_ARTERIAL

## 2021-09-11 MED ORDER — SODIUM CHLORIDE 0.9% FLUSH: 3.0000 mL | INTRAVENOUS | Status: DC | PRN

## 2021-09-11 MED ORDER — HEPARIN SODIUM (PORCINE) 5000 UNIT/ML IJ SOLN
5000.0000 [IU] | Freq: Three times a day (TID) | INTRAMUSCULAR | Status: DC
  Administered 2021-09-12: 5000 [IU] via SUBCUTANEOUS
  Filled 2021-09-11: qty 1

## 2021-09-11 SURGICAL SUPPLY — 13 items
BAND CMPR LRG ZPHR (HEMOSTASIS) ×1
BAND ZEPHYR COMPRESS 30 LONG (HEMOSTASIS) ×1 IMPLANT
CATH OPTITORQUE TIG 4.0 5F (CATHETERS) ×1 IMPLANT
GLIDESHEATH SLEND SS 6F .021 (SHEATH) ×1 IMPLANT
GUIDEWIRE INQWIRE 1.5J.035X260 (WIRE) IMPLANT
INQWIRE 1.5J .035X260CM (WIRE) ×2
KIT HEART LEFT (KITS) ×2 IMPLANT
PACK CARDIAC CATHETERIZATION (CUSTOM PROCEDURE TRAY) ×2 IMPLANT
SHEATH GLIDE SLENDER 4/5FR (SHEATH) ×1 IMPLANT
SHEATH PROBE COVER 6X72 (BAG) ×1 IMPLANT
TRANSDUCER W/STOPCOCK (MISCELLANEOUS) ×2 IMPLANT
TUBING CIL FLEX 10 FLL-RA (TUBING) ×2 IMPLANT
WIRE HI TORQ VERSACORE-J 145CM (WIRE) ×1 IMPLANT

## 2021-09-11 NOTE — Progress Notes (Addendum)
Diet orders NPO w/o exceptions. Cards [facility] NP paged for clarification on meds hold or modifications for exceptions.

## 2021-09-11 NOTE — Interval H&P Note (Signed)
History and Physical Interval Note:  09/11/2021 4:16 PM  Tiffany Wiggins  has presented today for surgery, with the diagnosis of NSTEMI.  The various methods of treatment have been discussed with the patient and family. After consideration of risks, benefits and other options for treatment, the patient has consented to  Procedure(s): LEFT HEART CATH AND CORONARY ANGIOGRAPHY (N/A) as a surgical intervention.  The patient's history has been reviewed, patient examined, no change in status, stable for surgery.  I have reviewed the patient's chart and labs.  Questions were answered to the patient's satisfaction.    Cath Lab Visit (complete for each Cath Lab visit)  Clinical Evaluation Leading to the Procedure:   ACS: Yes.    Non-ACS:  N/A  [name]

## 2021-09-11 NOTE — Plan of Care (Signed)
  Problem: Activity: Goal: Ability to avoid complications of mobility impairment will improve Outcome: Progressing Goal: Ability to tolerate increased activity will improve Outcome: Progressing   Problem: Clinical Measurements: Goal: Postoperative complications will be avoided or minimized Outcome: Progressing   Problem: Pain Management: Goal: Pain level will decrease with appropriate interventions Outcome: Progressing   Problem: Skin Integrity: Goal: Will show signs of wound healing Outcome: Progressing   Problem: Safety: Goal: Ability to remain free from injury will improve Outcome: Progressing   Problem: Cardiovascular: Goal: Ability to achieve and maintain adequate cardiovascular perfusion will improve Outcome: Progressing

## 2021-09-11 NOTE — Brief Op Note (Signed)
BRIEF CARDIAC CATHETERIZATION NOTE  DATE: 09/11/2021  TIME: 4:57 PM  PATIENT:  Tiffany Wiggins  76 y.o. female  PRE-OPERATIVE DIAGNOSIS:  NSTEMI  POST-OPERATIVE DIAGNOSIS:  MINOCA  PROCEDURE:  Procedure(s): LEFT HEART CATH AND CORONARY ANGIOGRAPHY (N/A)  SURGEON:  Surgeon(s) and Role:    * Adrianah Prophete, Harrell Gave, MD - Primary  FINDINGS: Nonobstructive CAD.  No clear culprit identified from NSTEMI/cardiomyopathy.  Question Takotsubo variant. Normal left heart filling pressure (LVEDP 13 mmHg).  RECOMMENDATIONS: Continue medical therapy for MI with nonobstructive coronary arteries. [facility]). Escalate GDMT for NICM as tolerated.  Nelva Bush, MD [facility]

## 2021-09-11 NOTE — TOC Initial Note (Signed)
Transition of Care [facility]) - Initial/Assessment Note    Patient Details  Name: Tiffany Wiggins MRN: [id number] Date of Birth: [birth year]  Transition of Care [facility]) CM/SW Contact:    Angelita Ingles, RN Phone Number:[phone]  09/11/2021, 9:53 AM  Clinical Narrative:                 TOC acknowledges general consult for Home Health / DME Needs. Currently there are no recommendations. TOC will continue to follow.     Barriers to Discharge: No Barriers Identified   Patient Goals and CMS Choice Patient states their goals for this hospitalization and ongoing recovery are:: Discharge home with HEP CMS Medicare.gov Compare Post Acute Care list provided to:: Patient Represenative (must comment) Choice offered to / list presented to : Spouse  Expected Discharge Plan and Services                           DME Arranged: Walker rolling DME Agency: [employer] Date DME Agency Contacted: 09/07/21   Representative spoke with at DME Agency: Wells Guiles            Prior Living Arrangements/Services                       Activities of Daily Living Home Assistive Devices/Equipment: Eyeglasses, Environmental consultant (specify type) ADL Screening (condition at time of admission) Patient's cognitive ability adequate to safely complete daily activities?: Yes Is the patient deaf or have difficulty hearing?: No Does the patient have difficulty seeing, even when wearing glasses/contacts?: No Does the patient have difficulty concentrating, remembering, or making decisions?: Yes Patient able to express need for assistance with ADLs?: Yes Does the patient have difficulty dressing or bathing?: No Independently performs ADLs?: Yes (appropriate for developmental age) Does the patient have difficulty walking or climbing stairs?: Yes Weakness of Legs: Left Weakness of Arms/Hands: None  Permission Sought/Granted                  Emotional Assessment              Admission diagnosis:  Primary  osteoarthritis of left hip [M16.12] Patient Active Problem List   Diagnosis Date Noted   AKI (acute kidney injury) ([place]) 25/36/6440   Acute systolic heart failure ([place]) 09/10/2021   S/P total left hip arthroplasty 09/09/2021   Primary osteoarthritis of left hip 09/06/2021   Bradycardia 05/23/2021   History of loop recorder 12/31/2019   CAD (coronary artery disease) 12/10/2018   NSTEMI (non-ST elevated myocardial infarction) ([place]) 12/08/2018   Paroxysmal A-fib ([place])    Senile purpura ([place]) 12/17/2017   Chronic pain of both shoulders 10/01/2017   Moderate late onset Alzheimer's dementia ([place]) 09/16/2017   Stage 3a chronic kidney disease ([place]) 05/29/2017   Vitamin B12 deficiency 03/20/2017   GERD without esophagitis 01/24/2017   Degenerative lumbar disc 01/24/2017   Bilateral carotid bruits 01/24/2017   Atherosclerosis of both carotid arteries 01/24/2017   History of CVA (cerebrovascular accident) 01/24/2017   High risk medication use 01/24/2017   Osteoarthritis of multiple joints 01/24/2017   Vitamin D deficiency 01/24/2017   Malaise and fatigue 01/24/2017   Pain of left hip joint 01/14/2017   Unilateral primary osteoarthritis, left hip 01/14/2017   Cough 02/15/2016   Wheezing 02/15/2016   Perforation of cecum s/p R hemicolectomy with anastamosis (08/25/2015) 08/26/2015   Preop cardiovascular exam 07/27/2015   Thoracic back pain 04/25/2015   Chronic venous insufficiency 12/21/2014  Hip arthritis 08/10/2014   Breast pain, right 07/22/2014   Thyroid nodule 05/01/2014   PALPITATIONS, CHRONIC 12/08/2009   HIP PAIN, BILATERAL 09/15/2009   OVERWEIGHT 03/08/2008   Cerebral artery occlusion with cerebral infarction ([place]) 03/08/2008   Hyperlipemia 01/30/2007   Essential hypertension 01/30/2007   Diabetes mellitus type 2, controlled, with complications ([place]) 80/22/1798   PCP:  Raina Mina., MD Pharmacy:   [facility] [address], [place] [address] Phone: [phone] Fax: [phone]  [place] [address] Phone: [phone] Fax: [phone]     Social Determinants of Health (SDOH) Interventions    Readmission Risk Interventions     No data to display

## 2021-09-11 NOTE — Progress Notes (Signed)
During shift change patient ripped off the TR band with 7 cc of air in it. Upon arriving to the room, the TR band was sitting on the bedside table. This RN and the off-going RN assessed the site, no bleeding or hematoma. The site was intact. We put the clear dressing on. Patient very agitated and did not want to keep the cardiac monitor on and kept ripping the electrodes.On-call MD paged and got order for ativan. Will continue to monitor the patient.

## 2021-09-11 NOTE — Progress Notes (Addendum)
ANTICOAGULATION CONSULT NOTE - Initial Consult  Pharmacy Consult for heparin Indication: chest pain/ACS and atrial fibrillation  Allergies  Allergen Reactions   Codeine Nausea Only   Morphine And Related Itching    Patient Measurements: Height: '5\' 6"'$  (167.6 cm) Weight: 72.6 kg (160 lb 0.9 oz) IBW/kg (Calculated) : 59.3 Heparin Dosing Weight: 74.2 kg  Vital Signs: Temp: 98.2 F (36.8 C) (06/12 0513) Temp Source: Oral (06/12 0513) BP: 109/56 (06/12 0513) Pulse Rate: 50 (06/12 0513)  Labs: Recent Labs    09/09/21 0041 09/09/21 0042 09/09/21 0318 09/09/21 1120 09/10/21 0544 09/10/21 0544 09/10/21 1426 09/11/21 0105 09/11/21 0555  HGB  --   --   --   --  8.7*   < > 8.3* 8.7*  --   HCT  --   --   --   --  26.8*  --  25.5* 27.0*  --   PLT  --   --   --   --  127*  --  132* 126*  --   APTT 33  --   --  78* 90*  --   --   --  52*  HEPARINUNFRC  --  >1.10*  --   --  >1.10*  --   --   --  >1.10*  CREATININE 1.39*  --   --   --   --   --   --   --   --   CKTOTAL  --   --  2,738*  --   --   --   --   --   --   CKMB  --   --  193.5*  --   --   --   --   --   --   TROPONINIHS >24,000*  --  >24,000*  --   --   --   --   --   --    < > = values in this interval not displayed.     Estimated Creatinine Clearance: 36.2 mL/min (A) (by C-G formula based on SCr of 1.39 mg/dL (H)).  Medical History: Past Medical History:  Diagnosis Date   Anxiety    Arthritis    CVA (cerebral vascular accident) ([place]) 2019   Diabetes mellitus type 2 in nonobese ([place])    Hyperlipidemia    Hypertension    PCP GAVE FOR HER  BLOOD PRESSURE   IBS (irritable bowel syndrome)    Memory deficit    PUD (peptic ulcer disease)    TIA (transient ischemic attack) 2006    Medications:  Infusions:   sodium chloride Stopped (09/07/21 1533)   sodium chloride     sodium chloride 1 mL/kg/hr (09/11/21 0502)   heparin 900 Units/hr (09/10/21 0458)   methocarbamol (ROBAXIN) IV      Assessment: 76 yo F  admitted for left THA with h/o afib.  PTA Eliquis resumed 06/08 1 day post op at 2.5 mg BID with plans to increase to PTA dose of 5 mg BID 06/10.  Developed CP 06/10 with trop x2 > 24,000, pre-op ischemic work up negative.  Pharmacy consulted for heparin dosing.  Last dose of Eliquis 06/09 '@21'$ :09.  Will monitor aPTT until Eliquis no longer interfering with anti-Xa activity.  aPTT 52 sec (on heparin 900 units/hr) Heparin level >1.1 (prior eliquis exposure) H/H stable  Goal of Therapy:  Heparin level 0.3-0.7 units/ml aPTT 66-102 seconds Monitor platelets by anticoagulation protocol: Yes   Plan:  Increase heparin infusion to 1000  units/hr F/u heparin plan post LHC for further anticoagulation plans Daily CBC, heparin level Monitor for s/sx of bleeding  Thank you for allowing pharmacy to be a part of this patient's care.  Donnald Garre, PharmD Clinical Pharmacist  Please check AMION for all [place] numbers After 10:00 PM, call [place] [phone]

## 2021-09-11 NOTE — H&P (View-Only) (Signed)
Progress Note  Patient Name: Tiffany Wiggins Date of Encounter: 09/11/2021  [facility] Cardiologist: Jenne Campus, MD   Subjective   No CP or dyspnea  Inpatient Medications    Scheduled Meds:  amLODipine  5 mg Oral Daily   aspirin EC  81 mg Oral Daily   docusate sodium  100 mg Oral BID   ferrous sulfate  325 mg Oral Daily   insulin aspart  0-15 Units Subcutaneous TID WC   isosorbide mononitrate  60 mg Oral Daily   losartan  50 mg Oral Daily   mouth rinse  15 mL Mouth Rinse BID   metoprolol tartrate  12.5 mg Oral BID   rosuvastatin  20 mg Oral Daily   sodium chloride flush  3 mL Intravenous Q12H   Continuous Infusions:  sodium chloride Stopped (09/07/21 1533)   sodium chloride     sodium chloride 1 mL/kg/hr (09/11/21 0502)   heparin 900 Units/hr (09/10/21 0458)   methocarbamol (ROBAXIN) IV     PRN Meds: sodium chloride, acetaminophen, bisacodyl, melatonin, menthol-cetylpyridinium **OR** phenol, methocarbamol **OR** methocarbamol (ROBAXIN) IV, metoCLOPramide **OR** metoCLOPramide (REGLAN) injection, naLOXone (NARCAN)  injection, naloxone, nitroGLYCERIN, ondansetron **OR** ondansetron (ZOFRAN) IV, polyethylene glycol, sodium chloride flush, traMADol, white petrolatum   Vital Signs    Vitals:   09/10/21 1619 09/10/21 1943 09/10/21 2159 09/11/21 0513  BP: (!) 118/59 (!) 103/48 127/64 (!) 109/56  Pulse: 60 63 66 (!) 50  Resp: 20 (!) '21 17 20  '$ Temp: 97.8 F (36.6 C) 98.2 F (36.8 C) 98 F (36.7 C) 98.2 F (36.8 C)  TempSrc: Oral Oral Oral Oral  SpO2: 99% 99% 99% 100%  Weight:    72.6 kg  Height:        Intake/Output Summary (Last 24 hours) at 09/11/2021 0849 Last data filed at 09/11/2021 0523 Gross per 24 hour  Intake 470.45 ml  Output 200 ml  Net 270.45 ml      09/11/2021    5:13 AM 09/06/2021    2:00 PM 09/06/2021    9:46 AM  Last 3 Weights  Weight (lbs) 160 lb 0.9 oz 163 lb 12.8 oz 164 lb  Weight (kg) 72.6 kg 74.3 kg 74.39 kg      Telemetry     Sinus bradycardia - Personally Reviewed  Physical Exam   GEN: No acute distress.   Neck: No JVD Cardiac: RR Respiratory: Clear to auscultation bilaterally. GI: Soft, nontender, non-distended  MS: No edema; s/p hip surgery Neuro:  Nonfocal  Psych: Normal affect   Labs    High Sensitivity Troponin:   Recent Labs  Lab 09/09/21 0041 09/09/21 0318  TROPONINIHS >24,000* >24,000*     Chemistry Recent Labs  Lab 09/07/21 0343 09/09/21 0041  NA 141 143  K 4.7 3.8  CL 103 107  CO2 28 27  GLUCOSE 153* 185*  BUN 32* 34*  CREATININE 1.55* 1.39*  CALCIUM 9.4 9.4  GFRNONAA 35* 40*  ANIONGAP 10 9    Lipids  Recent Labs  Lab 09/09/21 0318  CHOL 159  TRIG 71  HDL 81  LDLCALC 64  CHOLHDL 2.0    Hematology Recent Labs  Lab 09/10/21 0544 09/10/21 1426 09/11/21 0105  WBC 9.2 9.6 8.9  RBC 2.70* 2.55* 2.69*  HGB 8.7* 8.3* 8.7*  HCT 26.8* 25.5* 27.0*  MCV 99.3 100.0 100.4*  MCH 32.2 32.5 32.3  MCHC 32.5 32.5 32.2  RDW 12.3 12.1 12.1  PLT 127* 132* 126*    Radiology  ECHOCARDIOGRAM COMPLETE  Result Date: 09/09/2021    ECHOCARDIOGRAM REPORT   Patient Name:   Tiffany Wiggins Date of Exam: 09/09/2021 Medical Rec #:  [id number]     Height:       66.0 in Accession #:    [id number]    Weight:       163.8 lb Date of Birth:  Jun 08, 1945    BSA:          1.837 m Patient Age:    76 years      BP:           133/59 mmHg Patient Gender: F             HR:           74 bpm. Exam Location:  Inpatient Procedure: 2D Echo, Cardiac Doppler, Color Doppler and Intracardiac            Opacification Agent Indications:    Elevated troponin  History:        Patient has prior history of Echocardiogram examinations, most                 recent 07/19/2021. Risk Factors:Hypertension and Diabetes.  Sonographer:    Jefferey Pica Referring Phys: [place]  1. Left ventricular ejection fraction, by estimation, is 30 to 35%. The left ventricle has moderately decreased function. The left  ventricle demonstrates regional wall motion abnormalities (see scoring diagram/findings for description). There is mild concentric left ventricular hypertrophy. Left ventricular diastolic parameters are indeterminate.  2. Right ventricular systolic function is normal. The right ventricular size is normal. There is normal pulmonary artery systolic pressure.  3. Left atrial size was mildly dilated.  4. The mitral valve is normal in structure. Trivial mitral valve regurgitation. No evidence of mitral stenosis.  5. The aortic valve is grossly normal. Aortic valve regurgitation is not visualized. No aortic stenosis is present.  6. The inferior vena cava is dilated in size with >50% respiratory variability, suggesting right atrial pressure of 8 mmHg. Comparison(s): Changes from prior study are noted. Conclusion(s)/Recommendation(s): Findings consistent with ischemic cardiomyopathy. New wall motion abnormalities, most severe in lateral wall and with hypokinesis in the inferior wall. Distribution concerning for circumflex ischemia. Findings communicated with Dr. Wynelle Cleveland. FINDINGS  Left Ventricle: Left ventricular ejection fraction, by estimation, is 30 to 35%. The left ventricle has moderately decreased function. The left ventricle demonstrates regional wall motion abnormalities. Definity contrast agent was given IV to delineate the left ventricular endocardial borders. The left ventricular internal cavity size was normal in size. There is mild concentric left ventricular hypertrophy. Left ventricular diastolic parameters are indeterminate.  LV Wall Scoring: The entire lateral wall is akinetic. The entire inferior wall is hypokinetic. The entire anterior wall, entire septum, and apex are normal. Right Ventricle: The right ventricular size is normal. No increase in right ventricular wall thickness. Right ventricular systolic function is normal. There is normal pulmonary artery systolic pressure. The tricuspid regurgitant  velocity is 2.58 m/s, and  with an assumed right atrial pressure of 8 mmHg, the estimated right ventricular systolic pressure is 83.4 mmHg. Left Atrium: Left atrial size was mildly dilated. Right Atrium: Right atrial size was normal in size. Pericardium: There is no evidence of pericardial effusion. Mitral Valve: The mitral valve is normal in structure. Trivial mitral valve regurgitation. No evidence of mitral valve stenosis. Tricuspid Valve: The tricuspid valve is normal in structure. Tricuspid valve regurgitation is mild . No evidence of tricuspid stenosis.  Aortic Valve: The aortic valve is grossly normal. Aortic valve regurgitation is not visualized. No aortic stenosis is present. Aortic valve peak gradient measures 7.6 mmHg. Pulmonic Valve: The pulmonic valve was grossly normal. Pulmonic valve regurgitation is trivial. No evidence of pulmonic stenosis. Aorta: The aortic root, ascending aorta, aortic arch and descending aorta are all structurally normal, with no evidence of dilitation or obstruction. Venous: The inferior vena cava is dilated in size with greater than 50% respiratory variability, suggesting right atrial pressure of 8 mmHg. IAS/Shunts: The atrial septum is grossly normal.  LEFT VENTRICLE PLAX 2D LVIDd:         5.15 cm     Diastology LVIDs:         4.10 cm     LV e' medial:    4.12 cm/s LV PW:         1.25 cm     LV E/e' medial:  17.4 LV IVS:        1.20 cm     LV e' lateral:   3.81 cm/s LVOT diam:     1.85 cm     LV E/e' lateral: 18.8 LV SV:         65 LV SV Index:   36 LVOT Area:     2.69 cm  LV Volumes (MOD) LV vol d, MOD A4C: 86.3 ml LV vol s, MOD A4C: 59.4 ml LV SV MOD A4C:     86.3 ml RIGHT VENTRICLE             IVC RV S prime:     13.00 cm/s  IVC diam: 2.30 cm TAPSE (M-mode): 2.2 cm LEFT ATRIUM             Index        RIGHT ATRIUM           Index LA diam:        3.80 cm 2.07 cm/m   RA Area:     12.30 cm LA Vol (A2C):   48.6 ml 26.45 ml/m  RA Volume:   30.20 ml  16.44 ml/m LA Vol (A4C):    61.2 ml 33.31 ml/m LA Biplane Vol: 56.0 ml 30.48 ml/m  AORTIC VALVE                 PULMONIC VALVE AV Area (Vmax): 2.14 cm     PV Vmax:       0.86 m/s AV Vmax:        138.00 cm/s  PV Peak grad:  2.9 mmHg AV Peak Grad:   7.6 mmHg LVOT Vmax:      110.00 cm/s LVOT Vmean:     73.100 cm/s LVOT VTI:       0.243 m  AORTA Ao Root diam: 3.00 cm Ao Asc diam:  2.70 cm MITRAL VALVE                TRICUSPID VALVE MV Area (PHT): 3.37 cm     TR Peak grad:   26.6 mmHg MV Decel Time: 225 msec     TR Vmax:        258.00 cm/s MV E velocity: 71.60 cm/s MV A velocity: 111.00 cm/s  SHUNTS MV E/A ratio:  0.65         Systemic VTI:  0.24 m                             Systemic Diam: 1.85 cm  Buford Dresser MD Electronically signed by Buford Dresser MD Signature Date/Time: 09/09/2021/9:53:38 AM    Final       Patient Profile     76 y.o. female with past medical history of coronary artery disease, prior CVA, diabetes mellitus, hypertension, hyperlipidemia, irritable bowel syndrome, peptic ulcer disease for evaluation of postoperative myocardial infarction.  Patient had left total hip replacement on June 7.  Following her surgery she developed confusion/sundowning.  She then had an episode of chest pain and troponin greater than 24,000.  Echocardiogram shows ejection fraction 30 to 35%, mild left ventricular hypertrophy, mild left atrial enlargement.  Assessment & Plan    1 status post myocardial infarction-continue aspirin, low-dose Toprol as she is mildly bradycardic, heparin and statin.  Plan is for cardiac catheterization today.  The risk and benefits including myocardial infarction, CVA and death discussed and she agrees to proceed.  2 ischemic cardiomyopathy-ejection fraction 30 to 35%.  Discontinue amlodipine and isosorbide.  Change metoprolol to low-dose Toprol.  Continue losartan.  Will transition to [facility] following procedure once it is clear renal function is stable. Add farxiga prior to DC.  3 acute  kidney injury-most recent creatinine mildly elevated.  It has not been checked since June 9.  We will plan to repeat this morning prior to catheterization and follow after procedure.  4 hypertension-continue present medications other than adjustments as above.  Follow blood pressure and advance Entresto if needed.  5 hyperlipidemia-continue statin.  6 status post hip replacement-follow-up orthopedics.  7 history of paroxysmal atrial fibrillation-she is in sinus rhythm.  We will continue low-dose metoprolol.  Resume apixaban once all procedures complete.  For questions or updates, please contact [place] Please consult [email or web address] for contact info under        Signed, Kirk Ruths, MD  09/11/2021, 8:49 AM

## 2021-09-11 NOTE — Plan of Care (Signed)
Problem: Education: Goal: Individualized Educational Video(s) Outcome: Progressing   Problem: Activity: Goal: Ability to avoid complications of mobility impairment will improve Outcome: Progressing Goal: Ability to tolerate increased activity will improve Outcome: Progressing   Problem: Clinical Measurements: Goal: Postoperative complications will be avoided or minimized Outcome: Progressing   Problem: Pain Management: Goal: Pain level will decrease with appropriate interventions Outcome: Progressing   Problem: Skin Integrity: Goal: Will show signs of wound healing Outcome: Progressing   Problem: Education: Goal: Knowledge of General Education information will improve Description: Including pain rating scale, medication(s)/side effects and non-pharmacologic comfort measures Outcome: Progressing   Problem: Clinical Measurements: Goal: Will remain free from infection Outcome: Progressing Goal: Respiratory complications will improve Outcome: Progressing Goal: Cardiovascular complication will be avoided Outcome: Progressing   Problem: Activity: Goal: Risk for activity intolerance will decrease Outcome: Progressing   Problem: Nutrition: Goal: Adequate nutrition will be maintained Outcome: Progressing   Problem: Elimination: Goal: Will not experience complications related to bowel motility Outcome: Progressing Goal: Will not experience complications related to urinary retention Outcome: Progressing   Problem: Pain Managment: Goal: General experience of comfort will improve Outcome: Progressing   Problem: Safety: Goal: Ability to remain free from injury will improve Outcome: Progressing   Problem: Skin Integrity: Goal: Risk for impaired skin integrity will decrease Outcome: Progressing   Problem: Education: Goal: Individualized Educational Video(s) Outcome: Progressing   Problem: Coping: Goal: Ability to adjust to condition or change in health will  improve Outcome: Progressing   Problem: Fluid Volume: Goal: Ability to maintain a balanced intake and output will improve Outcome: Progressing   Problem: Health Behavior/Discharge Planning: Goal: Ability to identify and utilize available resources and services will improve Outcome: Progressing   Problem: Metabolic: Goal: Ability to maintain appropriate glucose levels will improve Outcome: Progressing   Problem: Nutritional: Goal: Maintenance of adequate nutrition will improve Outcome: Progressing Goal: Progress toward achieving an optimal weight will improve Outcome: Progressing   Problem: Skin Integrity: Goal: Risk for impaired skin integrity will decrease Outcome: Progressing   Problem: Tissue Perfusion: Goal: Adequacy of tissue perfusion will improve Outcome: Progressing   Problem: Education: Goal: Understanding of CV disease, CV risk reduction, and recovery process will improve Outcome: Progressing Goal: Individualized Educational Video(s) Outcome: Progressing   Problem: Activity: Goal: Ability to return to baseline activity level will improve Outcome: Progressing   Problem: Cardiovascular: Goal: Ability to achieve and maintain adequate cardiovascular perfusion will improve Outcome: Progressing Goal: Vascular access site(s) Level 0-1 will be maintained Outcome: Progressing   Problem: Health Behavior/Discharge Planning: Goal: Ability to safely manage health-related needs after discharge will improve Outcome: Progressing   Problem: Education: Goal: Understanding of CV disease, CV risk reduction, and recovery process will improve Outcome: Progressing Goal: Individualized Educational Video(s) Outcome: Progressing   Problem: Activity: Goal: Ability to return to baseline activity level will improve Outcome: Progressing   Problem: Cardiovascular: Goal: Ability to achieve and maintain adequate cardiovascular perfusion will improve Outcome: Progressing Goal:  Vascular access site(s) Level 0-1 will be maintained Outcome: Progressing   Problem: Education: Goal: Knowledge of the prescribed therapeutic regimen will improve Outcome: Not Progressing Goal: Understanding of discharge needs will improve Outcome: Not Progressing   Problem: Health Behavior/Discharge Planning: Goal: Ability to manage health-related needs will improve Outcome: Not Progressing   Problem: Clinical Measurements: Goal: Ability to maintain clinical measurements within normal limits will improve Outcome: Not Progressing Goal: Diagnostic test results will improve Outcome: Not Progressing   Problem: Coping: Goal: Level of anxiety will decrease  Outcome: Not Progressing   Problem: Education: Goal: Ability to describe self-care measures that may prevent or decrease complications (Diabetes Survival Skills Education) will improve Outcome: Not Progressing   Problem: Health Behavior/Discharge Planning: Goal: Ability to manage health-related needs will improve Outcome: Not Progressing

## 2021-09-11 NOTE — Progress Notes (Signed)
Pt to cath lab.

## 2021-09-11 NOTE — Progress Notes (Signed)
   Subjective: 5 Days Post-Op Procedure(s) (LRB): TOTAL HIP ARTHROPLASTY ANTERIOR APPROACH (Left) Patient seen in rounds for Dr. Wynelle Link. Patient is well, and has had no acute complaints or problems. NSTEMI over the weekend which is being followed and treated by cardiology. Denies SOB, chest pain, or dizziness. Patient reports pain as mild. Most discomfort when weightbearing but tolerable. She worked with physical therapy yesterday and ambulated 300'.  Objective: Vital signs in last 24 hours: Temp:  [97.8 F (36.6 C)-98.2 F (36.8 C)] 98.1 F (36.7 C) (06/12 1100) Pulse Rate:  [45-66] 45 (06/12 1100) Resp:  [14-22] 14 (06/12 1100) BP: (103-148)/(48-64) 105/59 (06/12 1100) SpO2:  [96 %-100 %] 99 % (06/12 1100) Weight:  [72.6 kg] 72.6 kg (06/12 0513)  Intake/Output from previous day:  Intake/Output Summary (Last 24 hours) at 09/11/2021 1550 Last data filed at 09/11/2021 0523 Gross per 24 hour  Intake 470.45 ml  Output --  Net 470.45 ml    Intake/Output this shift: No intake/output data recorded.  Labs: Recent Labs    09/10/21 0544 09/10/21 1426 09/11/21 0105  HGB 8.7* 8.3* 8.7*   Recent Labs    09/10/21 1426 09/11/21 0105  WBC 9.6 8.9  RBC 2.55* 2.69*  HCT 25.5* 27.0*  PLT 132* 126*   Recent Labs    09/10/21 0544 09/11/21 0938  NA 140 141  K 3.3* 3.4*  CL 103 107  CO2 27 23  BUN 36* 37*  CREATININE 1.47* 1.48*  GLUCOSE 140* 126*  CALCIUM 8.7* 8.6*   No results for input(s): "LABPT", "INR" in the last 72 hours.  Exam: General - Patient is Alert - at baseline Extremity - Neurologically intact Neurovascular intact Sensation intact distally Dorsiflexion/Plantar flexion intact Dressing/Incision - clean, dry, no drainage Motor Function - intact, moving foot and toes well on exam.   Past Medical History:  Diagnosis Date   Anxiety    Arthritis    CVA (cerebral vascular accident) ([place]) 2019   Diabetes mellitus type 2 in nonobese ([place])     Hyperlipidemia    Hypertension    PCP GAVE FOR HER  BLOOD PRESSURE   IBS (irritable bowel syndrome)    Memory deficit    PUD (peptic ulcer disease)    TIA (transient ischemic attack) 2006    Assessment/Plan: 5 Days Post-Op Procedure(s) (LRB): TOTAL HIP ARTHROPLASTY ANTERIOR APPROACH (Left) Principal Problem:   Primary osteoarthritis of left hip Active Problems:   Diabetes mellitus type 2, controlled, with complications (HCC)   Hip arthritis   NSTEMI (non-ST elevated myocardial infarction) (HCC)   Paroxysmal A-fib (HCC)   Moderate late onset Alzheimer's dementia ([place])   Stage 3a chronic kidney disease ([place])   S/P total left hip arthroplasty   AKI (acute kidney injury) ([place])   Acute systolic heart failure (HCC)  Estimated body mass index is 25.83 kg/m as calculated from the following:   Height as of this encounter: '5\' 6"'$  (1.676 m).   Weight as of this encounter: 72.6 kg. Up with therapy  DVT Prophylaxis -  Eliquis Weight-bearing as tolerated.  Cognition improved and has returned to baseline. Continue with physical therapy as long as stable from cardiac standpoint. Plan for cardiac cath today. Will continue to follow.  R. Jaynie Bream, PA-C Orthopedic Surgery [phone] 09/11/2021, 3:50 PM

## 2021-09-11 NOTE — Progress Notes (Addendum)
Progress Note  Patient Name: Tiffany Wiggins Date of Encounter: 09/11/2021  [facility] Cardiologist: Jenne Campus, MD   Subjective   No CP or dyspnea  Inpatient Medications    Scheduled Meds:  amLODipine  5 mg Oral Daily   aspirin EC  81 mg Oral Daily   docusate sodium  100 mg Oral BID   ferrous sulfate  325 mg Oral Daily   insulin aspart  0-15 Units Subcutaneous TID WC   isosorbide mononitrate  60 mg Oral Daily   losartan  50 mg Oral Daily   mouth rinse  15 mL Mouth Rinse BID   metoprolol tartrate  12.5 mg Oral BID   rosuvastatin  20 mg Oral Daily   sodium chloride flush  3 mL Intravenous Q12H   Continuous Infusions:  sodium chloride Stopped (09/07/21 1533)   sodium chloride     sodium chloride 1 mL/kg/hr (09/11/21 0502)   heparin 900 Units/hr (09/10/21 0458)   methocarbamol (ROBAXIN) IV     PRN Meds: sodium chloride, acetaminophen, bisacodyl, melatonin, menthol-cetylpyridinium **OR** phenol, methocarbamol **OR** methocarbamol (ROBAXIN) IV, metoCLOPramide **OR** metoCLOPramide (REGLAN) injection, naLOXone (NARCAN)  injection, naloxone, nitroGLYCERIN, ondansetron **OR** ondansetron (ZOFRAN) IV, polyethylene glycol, sodium chloride flush, traMADol, white petrolatum   Vital Signs    Vitals:   09/10/21 1619 09/10/21 1943 09/10/21 2159 09/11/21 0513  BP: (!) 118/59 (!) 103/48 127/64 (!) 109/56  Pulse: 60 63 66 (!) 50  Resp: 20 (!) '21 17 20  '$ Temp: 97.8 F (36.6 C) 98.2 F (36.8 C) 98 F (36.7 C) 98.2 F (36.8 C)  TempSrc: Oral Oral Oral Oral  SpO2: 99% 99% 99% 100%  Weight:    72.6 kg  Height:        Intake/Output Summary (Last 24 hours) at 09/11/2021 0849 Last data filed at 09/11/2021 0523 Gross per 24 hour  Intake 470.45 ml  Output 200 ml  Net 270.45 ml      09/11/2021    5:13 AM 09/06/2021    2:00 PM 09/06/2021    9:46 AM  Last 3 Weights  Weight (lbs) 160 lb 0.9 oz 163 lb 12.8 oz 164 lb  Weight (kg) 72.6 kg 74.3 kg 74.39 kg      Telemetry     Sinus bradycardia - Personally Reviewed  Physical Exam   GEN: No acute distress.   Neck: No JVD Cardiac: RR Respiratory: Clear to auscultation bilaterally. GI: Soft, nontender, non-distended  MS: No edema; s/p hip surgery Neuro:  Nonfocal  Psych: Normal affect   Labs    High Sensitivity Troponin:   Recent Labs  Lab 09/09/21 0041 09/09/21 0318  TROPONINIHS >24,000* >24,000*     Chemistry Recent Labs  Lab 09/07/21 0343 09/09/21 0041  NA 141 143  K 4.7 3.8  CL 103 107  CO2 28 27  GLUCOSE 153* 185*  BUN 32* 34*  CREATININE 1.55* 1.39*  CALCIUM 9.4 9.4  GFRNONAA 35* 40*  ANIONGAP 10 9    Lipids  Recent Labs  Lab 09/09/21 0318  CHOL 159  TRIG 71  HDL 81  LDLCALC 64  CHOLHDL 2.0    Hematology Recent Labs  Lab 09/10/21 0544 09/10/21 1426 09/11/21 0105  WBC 9.2 9.6 8.9  RBC 2.70* 2.55* 2.69*  HGB 8.7* 8.3* 8.7*  HCT 26.8* 25.5* 27.0*  MCV 99.3 100.0 100.4*  MCH 32.2 32.5 32.3  MCHC 32.5 32.5 32.2  RDW 12.3 12.1 12.1  PLT 127* 132* 126*    Radiology  ECHOCARDIOGRAM COMPLETE  Result Date: 09/09/2021    ECHOCARDIOGRAM REPORT   Patient Name:   Tiffany Wiggins Date of Exam: 09/09/2021 Medical Rec #:  [id number]     Height:       66.0 in Accession #:    [id number]    Weight:       163.8 lb Date of Birth:  05-06-1945    BSA:          1.837 m Patient Age:    76 years      BP:           133/59 mmHg Patient Gender: F             HR:           74 bpm. Exam Location:  Inpatient Procedure: 2D Echo, Cardiac Doppler, Color Doppler and Intracardiac            Opacification Agent Indications:    Elevated troponin  History:        Patient has prior history of Echocardiogram examinations, most                 recent 07/19/2021. Risk Factors:Hypertension and Diabetes.  Sonographer:    Jefferey Pica Referring Phys: [place]  1. Left ventricular ejection fraction, by estimation, is 30 to 35%. The left ventricle has moderately decreased function. The left  ventricle demonstrates regional wall motion abnormalities (see scoring diagram/findings for description). There is mild concentric left ventricular hypertrophy. Left ventricular diastolic parameters are indeterminate.  2. Right ventricular systolic function is normal. The right ventricular size is normal. There is normal pulmonary artery systolic pressure.  3. Left atrial size was mildly dilated.  4. The mitral valve is normal in structure. Trivial mitral valve regurgitation. No evidence of mitral stenosis.  5. The aortic valve is grossly normal. Aortic valve regurgitation is not visualized. No aortic stenosis is present.  6. The inferior vena cava is dilated in size with >50% respiratory variability, suggesting right atrial pressure of 8 mmHg. Comparison(s): Changes from prior study are noted. Conclusion(s)/Recommendation(s): Findings consistent with ischemic cardiomyopathy. New wall motion abnormalities, most severe in lateral wall and with hypokinesis in the inferior wall. Distribution concerning for circumflex ischemia. Findings communicated with Dr. Wynelle Cleveland. FINDINGS  Left Ventricle: Left ventricular ejection fraction, by estimation, is 30 to 35%. The left ventricle has moderately decreased function. The left ventricle demonstrates regional wall motion abnormalities. Definity contrast agent was given IV to delineate the left ventricular endocardial borders. The left ventricular internal cavity size was normal in size. There is mild concentric left ventricular hypertrophy. Left ventricular diastolic parameters are indeterminate.  LV Wall Scoring: The entire lateral wall is akinetic. The entire inferior wall is hypokinetic. The entire anterior wall, entire septum, and apex are normal. Right Ventricle: The right ventricular size is normal. No increase in right ventricular wall thickness. Right ventricular systolic function is normal. There is normal pulmonary artery systolic pressure. The tricuspid regurgitant  velocity is 2.58 m/s, and  with an assumed right atrial pressure of 8 mmHg, the estimated right ventricular systolic pressure is 62.3 mmHg. Left Atrium: Left atrial size was mildly dilated. Right Atrium: Right atrial size was normal in size. Pericardium: There is no evidence of pericardial effusion. Mitral Valve: The mitral valve is normal in structure. Trivial mitral valve regurgitation. No evidence of mitral valve stenosis. Tricuspid Valve: The tricuspid valve is normal in structure. Tricuspid valve regurgitation is mild . No evidence of tricuspid stenosis.  Aortic Valve: The aortic valve is grossly normal. Aortic valve regurgitation is not visualized. No aortic stenosis is present. Aortic valve peak gradient measures 7.6 mmHg. Pulmonic Valve: The pulmonic valve was grossly normal. Pulmonic valve regurgitation is trivial. No evidence of pulmonic stenosis. Aorta: The aortic root, ascending aorta, aortic arch and descending aorta are all structurally normal, with no evidence of dilitation or obstruction. Venous: The inferior vena cava is dilated in size with greater than 50% respiratory variability, suggesting right atrial pressure of 8 mmHg. IAS/Shunts: The atrial septum is grossly normal.  LEFT VENTRICLE PLAX 2D LVIDd:         5.15 cm     Diastology LVIDs:         4.10 cm     LV e' medial:    4.12 cm/s LV PW:         1.25 cm     LV E/e' medial:  17.4 LV IVS:        1.20 cm     LV e' lateral:   3.81 cm/s LVOT diam:     1.85 cm     LV E/e' lateral: 18.8 LV SV:         65 LV SV Index:   36 LVOT Area:     2.69 cm  LV Volumes (MOD) LV vol d, MOD A4C: 86.3 ml LV vol s, MOD A4C: 59.4 ml LV SV MOD A4C:     86.3 ml RIGHT VENTRICLE             IVC RV S prime:     13.00 cm/s  IVC diam: 2.30 cm TAPSE (M-mode): 2.2 cm LEFT ATRIUM             Index        RIGHT ATRIUM           Index LA diam:        3.80 cm 2.07 cm/m   RA Area:     12.30 cm LA Vol (A2C):   48.6 ml 26.45 ml/m  RA Volume:   30.20 ml  16.44 ml/m LA Vol (A4C):    61.2 ml 33.31 ml/m LA Biplane Vol: 56.0 ml 30.48 ml/m  AORTIC VALVE                 PULMONIC VALVE AV Area (Vmax): 2.14 cm     PV Vmax:       0.86 m/s AV Vmax:        138.00 cm/s  PV Peak grad:  2.9 mmHg AV Peak Grad:   7.6 mmHg LVOT Vmax:      110.00 cm/s LVOT Vmean:     73.100 cm/s LVOT VTI:       0.243 m  AORTA Ao Root diam: 3.00 cm Ao Asc diam:  2.70 cm MITRAL VALVE                TRICUSPID VALVE MV Area (PHT): 3.37 cm     TR Peak grad:   26.6 mmHg MV Decel Time: 225 msec     TR Vmax:        258.00 cm/s MV E velocity: 71.60 cm/s MV A velocity: 111.00 cm/s  SHUNTS MV E/A ratio:  0.65         Systemic VTI:  0.24 m                             Systemic Diam: 1.85 cm  Buford Dresser MD Electronically signed by Buford Dresser MD Signature Date/Time: 09/09/2021/9:53:38 AM    Final       Patient Profile     76 y.o. female with past medical history of coronary artery disease, prior CVA, diabetes mellitus, hypertension, hyperlipidemia, irritable bowel syndrome, peptic ulcer disease for evaluation of postoperative myocardial infarction.  Patient had left total hip replacement on June 7.  Following her surgery she developed confusion/sundowning.  She then had an episode of chest pain and troponin greater than 24,000.  Echocardiogram shows ejection fraction 30 to 35%, mild left ventricular hypertrophy, mild left atrial enlargement.  Assessment & Plan    1 status post myocardial infarction-continue aspirin, low-dose Toprol as she is mildly bradycardic, heparin and statin.  Plan is for cardiac catheterization today.  The risk and benefits including myocardial infarction, CVA and death discussed and she agrees to proceed.  2 ischemic cardiomyopathy-ejection fraction 30 to 35%.  Discontinue amlodipine and isosorbide.  Change metoprolol to low-dose Toprol.  Continue losartan.  Will transition to [facility] following procedure once it is clear renal function is stable. Add farxiga prior to DC.  3 acute  kidney injury-most recent creatinine mildly elevated.  It has not been checked since June 9.  We will plan to repeat this morning prior to catheterization and follow after procedure.  4 hypertension-continue present medications other than adjustments as above.  Follow blood pressure and advance Entresto if needed.  5 hyperlipidemia-continue statin.  6 status post hip replacement-follow-up orthopedics.  7 history of paroxysmal atrial fibrillation-she is in sinus rhythm.  We will continue low-dose metoprolol.  Resume apixaban once all procedures complete.  For questions or updates, please contact [place] Please consult [email or web address] for contact info under        Signed, Kirk Ruths, MD  09/11/2021, 8:49 AM

## 2021-09-11 NOTE — Progress Notes (Signed)
Physical Therapy Treatment Patient Details Name: Tiffany Wiggins MRN: [id number] DOB: [birth year] Today's Date: 09/11/2021   History of Present Illness Pt is a 76 yo female presenting 09/06/21 for L anterior approach THA. Rapid Response called for decreased level of consciousness 6/8 and called for chest pain 6/9. Troponin >24,000 without ischemic changes on EKG consistent with post-operative NSTEMI. Transferred to [facility] 6/10. Plan for cardiac cath 6/12. PMH: hx of CVA, DM, HLD, HTN, IBS, hx of Tia, lumbar L4-L5 decompression, adominal exploratory laparotomy 2017, loop recorder implant 2015, PAF, Alzheimer's dementia.    PT Comments    Pt slowly progressing towards PT goals. Today's session focused on stair training for safe d/c home and increasing independence with mobility. Pt continues to present with mild balance deficits and decreased tolerance to activity. Pt continues to be modA for bed mobility and minA for transfers and ambulation for safety. Used a rollator during today's session for a rest break if needed by the pt to tolerate ambulation to the stairs, educated pt on safe use of rollator as well. Pt tolerated 400 ft of ambulation with a rollator with no rest breaks needed. Pt tolerated 2 sets of 3 stairs with the rail on L, educated spouse on safe guarding and navigation of stairs once home. Pt would continue to benefit from skilled PT for functional mobility training, balance training, and stair training for safe d/c home and increase independence. Will continue to follow acutely.   Recommendations for follow up therapy are one component of a multi-disciplinary discharge planning process, led by the attending physician.  Recommendations may be updated based on patient status, additional functional criteria and insurance authorization.  Follow Up Recommendations  Follow physician's recommendations for discharge plan and follow up therapies     Assistance Recommended at Discharge Frequent or  constant Supervision/Assistance  Patient can return home with the following A little help with walking and/or transfers;A little help with bathing/dressing/bathroom;Assistance with cooking/housework;Assist for transportation;Help with stairs or ramp for entrance;Direct supervision/assist for medications management;Direct supervision/assist for financial management   Equipment Recommendations  BSC/3in1    Recommendations for Other Services       Precautions / Restrictions Precautions Precautions: Fall Restrictions Weight Bearing Restrictions: Yes LLE Weight Bearing: Weight bearing as tolerated     Mobility  Bed Mobility Overal bed mobility: Needs Assistance Bed Mobility: Supine to Sit, Sidelying to Sit, Sit to Supine Rolling: Min assist Sidelying to sit: Min assist Supine to sit: HOB elevated, Mod assist Sit to supine: Min assist, HOB elevated   General bed mobility comments: pt requiring cues for hand placement on rails, minA with LE positioning, mod A for supine to sit with trunk elevation despite cues for log rolling technique.    Transfers Overall transfer level: Needs assistance Equipment used: Rollator (4 wheels) Transfers: Sit to/from Stand Sit to Stand: Min assist           General transfer comment: from EOB x1. MinA using momemtum to power up to stand from EOB requiring cues for hand placement on bed and RW and scooting to EOB with feet flat on floor.    Ambulation/Gait Ambulation/Gait assistance: Min assist Gait Distance (Feet): 400 Feet Assistive device: Rollator (4 wheels) Gait Pattern/deviations: Step-through pattern, Decreased stride length, Decreased dorsiflexion - left, Antalgic, Shuffle, Narrow base of support, Trunk flexed Gait velocity: decreased Gait velocity interpretation: <1.31 ft/sec, indicative of household ambulator   General Gait Details: Pt with slow but steady gait with minA for safety and decreased stride  length. Pt with decreased knee  and hip flexion on L with antalgic gait pattern.   Stairs Stairs: Yes Stairs assistance: Min assist Stair Management: One rail Left, Step to pattern, Forwards Number of Stairs: 3 (x 2) General stair comments: pt required continued cues for stair navigation on which leg to lead with. ascended and descended forwards with rail on L. second set of 3 was used to educate spouse who assisted.   Wheelchair Mobility    Modified Rankin (Stroke Patients Only)       Balance Overall balance assessment: Needs assistance Sitting-balance support: Feet supported, No upper extremity supported Sitting balance-Leahy Scale: Fair Sitting balance - Comments: able to sit EOB and turn around to fix lines without LOB   Standing balance support: During functional activity, Reliant on assistive device for balance Standing balance-Leahy Scale: Poor Standing balance comment: Able to stand and take one hand off RW to fix lines without LOB. RW needed for pain control and increased stability                            Cognition Arousal/Alertness: Awake/alert Behavior During Therapy: WFL for tasks assessed/performed, Flat affect Overall Cognitive Status: Impaired/Different from baseline Area of Impairment: Attention                   Current Attention Level: Sustained           General Comments: Pt has history of stroke, dementia, poor short-term memory. Follows commands appropriately. Aware of THA and pain attribution. very distractable and fidgeted with lines/tubes, concern overrided attention to cues at times throughout session.        Exercises Total Joint Exercises Ankle Circles/Pumps: AROM, Supine, Seated, Left, 10 reps Quad Sets: AROM, 10 reps, Supine, Seated, Left, Strengthening Heel Slides: AROM, Left, 5 reps, Supine Long Arc Quad: AROM, Seated, 10 reps, Left, Strengthening    General Comments        Pertinent Vitals/Pain Pain Assessment Pain Assessment: 0-10 Pain  Score: 7  Pain Location: left hip Pain Descriptors / Indicators: Operative site guarding, Discomfort, Grimacing, Guarding, Sore Pain Intervention(s): Limited activity within patient's tolerance, Monitored during session    Home Living                          Prior Function            PT Goals (current goals can now be found in the care plan section) Acute Rehab PT Goals Patient Stated Goal: To walk without pain PT Goal Formulation: With patient/family Time For Goal Achievement: 09/24/21 Potential to Achieve Goals: Good Progress towards PT goals: Progressing toward goals    Frequency    7X/week      PT Plan Current plan remains appropriate    Co-evaluation              AM-PAC PT "6 Clicks" Mobility   Outcome Measure  Help needed turning from your back to your side while in a flat bed without using bedrails?: A Little Help needed moving from lying on your back to sitting on the side of a flat bed without using bedrails?: A Lot Help needed moving to and from a bed to a chair (including a wheelchair)?: A Little Help needed standing up from a chair using your arms (e.g., wheelchair or bedside chair)?: A Little Help needed to walk in hospital room?: A Little Help needed climbing 3-5  steps with a railing? : A Little 6 Click Score: 17    End of Session Equipment Utilized During Treatment: Gait belt Activity Tolerance: Patient tolerated treatment well Patient left: in bed;with call bell/phone within reach;with family/visitor present Nurse Communication: Mobility status PT Visit Diagnosis: Difficulty in walking, not elsewhere classified (R26.2);Pain;Unsteadiness on feet (R26.81);Other abnormalities of gait and mobility (R26.89);Muscle weakness (generalized) (M62.81) Pain - Right/Left: Left Pain - part of body: Hip     Time: 1610-9604 PT Time Calculation (min) (ACUTE ONLY): 34 min  Charges:  $Gait Training: 8-22 mins $Therapeutic Activity: 8-22  mins                     Havery Moros, MS, [place] Acute Rehabilitation Services Office: [place] 09/11/2021, 2:08 PM

## 2021-09-11 NOTE — Progress Notes (Signed)
Physical Therapy Treatment Patient Details Name: Tiffany Wiggins MRN: [id number] DOB: May 20, 1945 Today's Date: 09/11/2021   History of Present Illness Pt is a 76 yo female presenting 09/06/21 for L anterior approach THA. Rapid Response called for decreased level of consciousness 6/8 and called for chest pain 6/9. Troponin >24,000 without ischemic changes on EKG consistent with post-operative NSTEMI. Transferred to [facility] 6/10. Plan for cardiac cath 6/12. PMH: hx of CVA, DM, HLD, HTN, IBS, hx of Tia, lumbar L4-L5 decompression, adominal exploratory laparotomy 2017, loop recorder implant 2015, PAF, Alzheimer's dementia.    PT Comments    Pt slowly progressing towards PT goals. Pt currently mod A for bed mobility, and min A for transfers and ambulation for safety. Pt aware of L THA today during conversation of pain. Pt provided L THA handout and educated her and spouse on post-op exercises for increasing strength and decreasing the negative effects of immobility. Today's session focused on therapeutic exercise and activity, as well as functional mobility training. Pt tolerated 250 ft of ambulation with RW for pain control and increased stability. Pt 2/4 DOE scale, however did not complain of being SOB. Pt would continue to benefit from skilled PT in order to increase independence and improve mobility, as well as begin stair training for safe eventual discharge home. Will follow acutely.   Recommendations for follow up therapy are one component of a multi-disciplinary discharge planning process, led by the attending physician.  Recommendations may be updated based on patient status, additional functional criteria and insurance authorization.  Follow Up Recommendations  Follow physician's recommendations for discharge plan and follow up therapies     Assistance Recommended at Discharge Frequent or constant Supervision/Assistance  Patient can return home with the following A little help with walking and/or  transfers;A little help with bathing/dressing/bathroom;Assistance with cooking/housework;Assist for transportation;Help with stairs or ramp for entrance;Direct supervision/assist for medications management;Direct supervision/assist for financial management   Equipment Recommendations  BSC/3in1    Recommendations for Other Services       Precautions / Restrictions Precautions Precautions: Fall Restrictions Weight Bearing Restrictions: Yes LLE Weight Bearing: Weight bearing as tolerated     Mobility  Bed Mobility Overal bed mobility: Needs Assistance Bed Mobility: Supine to Sit, Sidelying to Sit, Rolling Rolling: Min assist Sidelying to sit: Mod assist Supine to sit: HOB elevated, Mod assist     General bed mobility comments: pt requiring cues for hand placement on rails, minA with LE positioning, mod A for supine to sit with trunk elevation despite cues for log rolling technique.    Transfers Overall transfer level: Needs assistance Equipment used: Rolling walker (2 wheels) Transfers: Sit to/from Stand Sit to Stand: Min assist           General transfer comment: from EOB x1. MinA using momemtum to power up to stand from EOB requiring cues for hand placement on bed and RW and scooting to EOB with feet flat on floor. transferred to chair post-ambulation.    Ambulation/Gait Ambulation/Gait assistance: Min assist Gait Distance (Feet): 250 Feet Assistive device: Rolling walker (2 wheels) Gait Pattern/deviations: Step-through pattern, Decreased stride length, Decreased dorsiflexion - left, Antalgic, Shuffle, Narrow base of support, Trunk flexed Gait velocity: decreased Gait velocity interpretation: <1.31 ft/sec, indicative of household ambulator   General Gait Details: Pt with slow but steady gait with minA for safety and decreased stride length. Pt with decreased knee and hip flexion on L with antalgic gait pattern. Pt with 2/4 DOE scale, but no complaints of  being SOB. HR  range 63-75 bpm.   Stairs             Wheelchair Mobility    Modified Rankin (Stroke Patients Only)       Balance Overall balance assessment: Needs assistance Sitting-balance support: Feet supported, No upper extremity supported Sitting balance-Leahy Scale: Fair Sitting balance - Comments: able to sit EOB and turn around to fix lines without LOB   Standing balance support: During functional activity, Reliant on assistive device for balance Standing balance-Leahy Scale: Poor Standing balance comment: Able to stand and take one hand off RW to fix lines without LOB. RW needed for pain control and increased stability                            Cognition Arousal/Alertness: Awake/alert Behavior During Therapy: WFL for tasks assessed/performed, Flat affect Overall Cognitive Status: History of cognitive impairments - at baseline                                 General Comments: Pt has history of stroke, dementia, poor short-term memory. Follows commands appropriately. Aware of THA and pain attribution.        Exercises Total Joint Exercises Ankle Circles/Pumps: AROM, Supine, Seated, Left, 10 reps Quad Sets: AROM, 10 reps, Supine, Seated, Left, Strengthening Heel Slides: AROM, Left, 5 reps, Supine Long Arc Quad: AROM, Seated, 10 reps, Left, Strengthening    General Comments        Pertinent Vitals/Pain Pain Assessment Pain Assessment: 0-10 Pain Score: 6  Pain Location: left hip Pain Descriptors / Indicators: Operative site guarding, Discomfort, Grimacing, Guarding, Sore Pain Intervention(s): Limited activity within patient's tolerance, Monitored during session    Home Living                          Prior Function            PT Goals (current goals can now be found in the care plan section) Acute Rehab PT Goals Patient Stated Goal: To walk without pain PT Goal Formulation: With patient/family Time For Goal Achievement:  09/24/21 Potential to Achieve Goals: Good Progress towards PT goals: Progressing toward goals    Frequency    7X/week      PT Plan Current plan remains appropriate    Co-evaluation              AM-PAC PT "6 Clicks" Mobility   Outcome Measure  Help needed turning from your back to your side while in a flat bed without using bedrails?: A Little Help needed moving from lying on your back to sitting on the side of a flat bed without using bedrails?: A Lot Help needed moving to and from a bed to a chair (including a wheelchair)?: A Little Help needed standing up from a chair using your arms (e.g., wheelchair or bedside chair)?: A Little Help needed to walk in hospital room?: A Little Help needed climbing 3-5 steps with a railing? : A Lot 6 Click Score: 2    End of Session Equipment Utilized During Treatment: Gait belt Activity Tolerance: Patient tolerated treatment well Patient left: in chair;with call bell/phone within reach;with family/visitor present Nurse Communication: Mobility status PT Visit Diagnosis: Difficulty in walking, not elsewhere classified (R26.2);Pain;Unsteadiness on feet (R26.81);Other abnormalities of gait and mobility (R26.89);Muscle weakness (generalized) (M62.81) Pain - Right/Left: Left  Pain - part of body: Hip     Time: 1100-3496 PT Time Calculation (min) (ACUTE ONLY): 36 min  Charges:  $Gait Training: 8-22 mins $Therapeutic Activity: 8-22 mins                    Havery Moros, MS, [place] Acute Rehabilitation Services Office: [place] 09/11/2021, 9:49 AM

## 2021-09-11 NOTE — Progress Notes (Signed)
PROGRESS NOTE    Tiffany Wiggins  CXK:[id number] DOB: [birth year] DOA: 09/06/2021 PCP: Raina Mina., MD   Brief Narrative: Tiffany MELOY is a 76 y.o. female with a history of paroxysmal atrial fibrillation, hypertension, atrial fibrillation, dementia, diabetes mellitus type 2. Patient presented secondary to osteoarthritis and planned total left hip arthroplasty. Post-operatively, patient developed chest pain. Troponin >24,000 without ischemic changes on EKG consistent with post-operative NSTEMI. Heparin IV started. Cardiology consulted with plan for cardiac catheterization.   Assessment and Plan: * Primary osteoarthritis of left hip Patient is s/p left total hip arthroplasty on 6/7. -Orthopedic surgery recommendations: WBAT from orthopedic standpoint -PT/OT  NSTEMI (non-ST elevated myocardial infarction) ([place]) Postoperative NSTEMI. Chest pain with troponin >24,000. Heparin IV started and cardiology consulted. Transthoracic Echocardiogram significant for a reduced LVEF of 30-35% with regional wall motion abnormalities. Plan for cardiac catheterization. -Continue oxygen, heparin IV, metoprolol -Cardiac catheterization today (6/12)  Moderate late onset Alzheimer's dementia (Tiffany Wiggins) Noted. Delirium risk. Not on medication management. -Delirium precautions  S/P total left hip arthroplasty Management per ortho.  Stage 3a chronic kidney disease (HCC) Baseline creatinine of 1.1-1.2.  Paroxysmal A-fib [facility] Wiggins) Patient is not on rate/antiarrhythmic medication management as an outpatient. She uses propranolol only as PRN. She is managed on Eliquis 5 mg BID. Transitioned to heparin IV secondary to NSTEMI. Rate controlled and NSR. -Hold Eliquis  Acute systolic heart failure (HCC) No acute symptoms. In setting of NSTEMI. LVEF of 30-35%. -Continue losartan and Lasix -Started on metoprolol  AKI (acute kidney injury) (Tiffany Wiggins) Peak creatinine of 1.55. now trending back towards baseline.  Diabetes  mellitus type 2, controlled, with complications (Tiffany Wiggins) Hemoglobin A1C of 5.8%. Diet controlled. Started on SSI while inpatient. -Continue SSI -Carb modified diet    DVT prophylaxis: Heparin IV Code Status:   Code Status: Wiggins Code Family Communication: Husband at bedside Disposition Plan: Discharge pending specialist recommendations/management and PT/OT recommendations.   Consultants:  Orthopedic surgery Cardiology  Procedures:  Transthoracic Echocardiogram (6/10)  Antimicrobials: None    Subjective: No chest pain. No issues overnight. Thirsty.  Objective: BP (!) 109/56 (BP Location: Left Arm)   Pulse (!) 50   Temp 98.2 F (36.8 C) (Oral)   Resp 20   Ht '5\' 6"'$  (1.676 m)   Wt 72.6 kg   SpO2 100%   BMI 25.83 kg/m   Examination:  General exam: Appears calm and comfortable Respiratory system: Clear to auscultation. Respiratory effort normal. Cardiovascular system: S1 & S2 heard, RRR. No murmurs. Gastrointestinal system: Abdomen is nondistended, soft and nontender. Normal bowel sounds heard. Central nervous system: Alert No focal neurological deficits. Musculoskeletal: No edema. No calf tenderness Skin: No cyanosis. No rashes Psychiatry: Judgement and insight appear normal. Mood & affect appropriate.    Data Reviewed: I have personally reviewed following labs and imaging studies  CBC Lab Results  Component Value Date   WBC 8.9 09/11/2021   RBC 2.69 (L) 09/11/2021   HGB 8.7 (L) 09/11/2021   HCT 27.0 (L) 09/11/2021   MCV 100.4 (H) 09/11/2021   MCH 32.3 09/11/2021   PLT 126 (L) 09/11/2021   MCHC 32.2 09/11/2021   RDW 12.1 09/11/2021   LYMPHSABS 1.2 09/10/2021   MONOABS 0.6 09/10/2021   EOSABS 0.2 09/10/2021   BASOSABS 0.0 97/05/6376     Last metabolic panel Lab Results  Component Value Date   NA 143 09/09/2021   K 3.8 09/09/2021   CL 107 09/09/2021   CO2 27 09/09/2021   BUN 34 (  H) 09/09/2021   CREATININE 1.39 (H) 09/09/2021   GLUCOSE 185 (H)  09/09/2021   GFRNONAA 40 (L) 09/09/2021   GFRAA >60 12/09/2018   CALCIUM 9.4 09/09/2021   PROT 6.4 (L) 12/08/2018   ALBUMIN 3.8 12/08/2018   BILITOT 0.4 12/08/2018   ALKPHOS 54 12/08/2018   AST 21 12/08/2018   ALT 16 12/08/2018   ANIONGAP 9 09/09/2021    GFR: Estimated Creatinine Clearance: 36.2 mL/min (A) (by C-G formula based on SCr of 1.39 mg/dL (H)).  No results found for this or any previous visit (from the past 240 hour(s)).    Radiology Studies: ECHOCARDIOGRAM COMPLETE  Result Date: 09/09/2021    ECHOCARDIOGRAM REPORT   Patient Name:   NAILANI Wiggins Date of Exam: 09/09/2021 Medical Rec #:  [id number]     Height:       66.0 in Accession #:    [id number]    Weight:       163.8 lb Date of Birth:  05-03-1945    BSA:          1.837 m Patient Age:    7 years      BP:           133/59 mmHg Patient Gender: F             HR:           74 bpm. Exam Location:  Inpatient Procedure: 2D Echo, Cardiac Doppler, Color Doppler and Intracardiac            Opacification Agent Indications:    Elevated troponin  History:        Patient has prior history of Echocardiogram examinations, most                 recent 07/19/2021. Risk Factors:Hypertension and Diabetes.  Sonographer:    Jefferey Pica Referring Phys: [place]  1. Left ventricular ejection fraction, by estimation, is 30 to 35%. The left ventricle has moderately decreased function. The left ventricle demonstrates regional wall motion abnormalities (see scoring diagram/findings for description). There is mild concentric left ventricular hypertrophy. Left ventricular diastolic parameters are indeterminate.  2. Right ventricular systolic function is normal. The right ventricular size is normal. There is normal pulmonary artery systolic pressure.  3. Left atrial size was mildly dilated.  4. The mitral valve is normal in structure. Trivial mitral valve regurgitation. No evidence of mitral stenosis.  5. The aortic valve is grossly normal.  Aortic valve regurgitation is not visualized. No aortic stenosis is present.  6. The inferior vena cava is dilated in size with >50% respiratory variability, suggesting right atrial pressure of 8 mmHg. Comparison(s): Changes from prior study are noted. Conclusion(s)/Recommendation(s): Findings consistent with ischemic cardiomyopathy. New wall motion abnormalities, most severe in lateral wall and with hypokinesis in the inferior wall. Distribution concerning for circumflex ischemia. Findings communicated with Dr. Wynelle Cleveland. FINDINGS  Left Ventricle: Left ventricular ejection fraction, by estimation, is 30 to 35%. The left ventricle has moderately decreased function. The left ventricle demonstrates regional wall motion abnormalities. Definity contrast agent was given IV to delineate the left ventricular endocardial borders. The left ventricular internal cavity size was normal in size. There is mild concentric left ventricular hypertrophy. Left ventricular diastolic parameters are indeterminate.  LV Wall Scoring: The entire lateral wall is akinetic. The entire inferior wall is hypokinetic. The entire anterior wall, entire septum, and apex are normal. Right Ventricle: The right ventricular size is normal. No increase in right ventricular  wall thickness. Right ventricular systolic function is normal. There is normal pulmonary artery systolic pressure. The tricuspid regurgitant velocity is 2.58 m/s, and  with an assumed right atrial pressure of 8 mmHg, the estimated right ventricular systolic pressure is 20.9 mmHg. Left Atrium: Left atrial size was mildly dilated. Right Atrium: Right atrial size was normal in size. Pericardium: There is no evidence of pericardial effusion. Mitral Valve: The mitral valve is normal in structure. Trivial mitral valve regurgitation. No evidence of mitral valve stenosis. Tricuspid Valve: The tricuspid valve is normal in structure. Tricuspid valve regurgitation is mild . No evidence of tricuspid  stenosis. Aortic Valve: The aortic valve is grossly normal. Aortic valve regurgitation is not visualized. No aortic stenosis is present. Aortic valve peak gradient measures 7.6 mmHg. Pulmonic Valve: The pulmonic valve was grossly normal. Pulmonic valve regurgitation is trivial. No evidence of pulmonic stenosis. Aorta: The aortic root, ascending aorta, aortic arch and descending aorta are all structurally normal, with no evidence of dilitation or obstruction. Venous: The inferior vena cava is dilated in size with greater than 50% respiratory variability, suggesting right atrial pressure of 8 mmHg. IAS/Shunts: The atrial septum is grossly normal.  LEFT VENTRICLE PLAX 2D LVIDd:         5.15 cm     Diastology LVIDs:         4.10 cm     LV e' medial:    4.12 cm/s LV PW:         1.25 cm     LV E/e' medial:  17.4 LV IVS:        1.20 cm     LV e' lateral:   3.81 cm/s LVOT diam:     1.85 cm     LV E/e' lateral: 18.8 LV SV:         65 LV SV Index:   36 LVOT Area:     2.69 cm  LV Volumes (MOD) LV vol d, MOD A4C: 86.3 ml LV vol s, MOD A4C: 59.4 ml LV SV MOD A4C:     86.3 ml RIGHT VENTRICLE             IVC RV S prime:     13.00 cm/s  IVC diam: 2.30 cm TAPSE (M-mode): 2.2 cm LEFT ATRIUM             Index        RIGHT ATRIUM           Index LA diam:        3.80 cm 2.07 cm/m   RA Area:     12.30 cm LA Vol (A2C):   48.6 ml 26.45 ml/m  RA Volume:   30.20 ml  16.44 ml/m LA Vol (A4C):   61.2 ml 33.31 ml/m LA Biplane Vol: 56.0 ml 30.48 ml/m  AORTIC VALVE                 PULMONIC VALVE AV Area (Vmax): 2.14 cm     PV Vmax:       0.86 m/s AV Vmax:        138.00 cm/s  PV Peak grad:  2.9 mmHg AV Peak Grad:   7.6 mmHg LVOT Vmax:      110.00 cm/s LVOT Vmean:     73.100 cm/s LVOT VTI:       0.243 m  AORTA Ao Root diam: 3.00 cm Ao Asc diam:  2.70 cm MITRAL VALVE  TRICUSPID VALVE MV Area (PHT): 3.37 cm     TR Peak grad:   26.6 mmHg MV Decel Time: 225 msec     TR Vmax:        258.00 cm/s MV E velocity: 71.60 cm/s MV A  velocity: 111.00 cm/s  SHUNTS MV E/A ratio:  0.65         Systemic VTI:  0.24 m                             Systemic Diam: 1.85 cm Buford Dresser MD Electronically signed by Buford Dresser MD Signature Date/Time: 09/09/2021/9:53:38 AM    Final       LOS: 4 days    Cordelia Poche, MD [facility] 09/11/2021, 8:06 AM   If 7PM-7AM, please contact night-coverage [email or web address]

## 2021-09-12 ENCOUNTER — Encounter (HOSPITAL_COMMUNITY): Payer: Self-pay | Admitting: Internal Medicine

## 2021-09-12 DIAGNOSIS — I5021 Acute systolic (congestive) heart failure: Secondary | ICD-10-CM | POA: Diagnosis not present

## 2021-09-12 DIAGNOSIS — M1612 Unilateral primary osteoarthritis, left hip: Principal | ICD-10-CM

## 2021-09-12 DIAGNOSIS — I214 Non-ST elevation (NSTEMI) myocardial infarction: Secondary | ICD-10-CM | POA: Diagnosis not present

## 2021-09-12 LAB — BASIC METABOLIC PANEL
Anion gap: 9 (ref 5–15)
BUN: 45 mg/dL — ABNORMAL HIGH (ref 8–23)
CO2: 23 mmol/L (ref 22–32)
Calcium: 8.6 mg/dL — ABNORMAL LOW (ref 8.9–10.3)
Chloride: 108 mmol/L (ref 98–111)
Creatinine, Ser: 1.71 mg/dL — ABNORMAL HIGH (ref 0.44–1.00)
GFR, Estimated: 31 mL/min — ABNORMAL LOW (ref >60)
Glucose, Bld: 117 mg/dL — ABNORMAL HIGH (ref 70–99)
Potassium: 4.7 mmol/L (ref 3.5–5.1)
Sodium: 140 mmol/L (ref 135–145)

## 2021-09-12 LAB — GLUCOSE, CAPILLARY
Glucose-Capillary: 113 mg/dL — ABNORMAL HIGH (ref 70–99)
Glucose-Capillary: 112 mg/dL — ABNORMAL HIGH (ref 70–99)
Glucose-Capillary: 143 mg/dL — ABNORMAL HIGH (ref 70–99)

## 2021-09-12 LAB — CBC
HCT: 26.5 % — ABNORMAL LOW (ref 36.0–46.0)
Hemoglobin: 8.6 g/dL — ABNORMAL LOW (ref 12.0–15.0)
MCH: 32.2 pg (ref 26.0–34.0)
MCHC: 32.5 g/dL (ref 30.0–36.0)
MCV: 99.3 fL (ref 80.0–100.0)
Platelets: 131 10*3/uL — ABNORMAL LOW (ref 150–400)
RBC: 2.67 MIL/uL — ABNORMAL LOW (ref 3.87–5.11)
RDW: 12.4 % (ref 11.5–15.5)
WBC: 8.5 10*3/uL (ref 4.0–10.5)
nRBC: 0 % (ref 0.0–0.2)

## 2021-09-12 MED ORDER — ROSUVASTATIN CALCIUM 20 MG PO TABS: 20.0000 mg | ORAL_TABLET | Freq: Every day | ORAL | 2 refills | Status: AC

## 2021-09-12 MED ORDER — TRAMADOL HCL 50 MG PO TABS: 50.0000 mg | ORAL_TABLET | Freq: Four times a day (QID) | ORAL | 0 refills | Status: DC | PRN

## 2021-09-12 MED ORDER — LOSARTAN POTASSIUM 50 MG PO TABS
50.0000 mg | ORAL_TABLET | Freq: Every day | ORAL | Status: DC
  Administered 2021-09-12: 50 mg via ORAL
  Filled 2021-09-12: qty 1

## 2021-09-12 MED ORDER — METOPROLOL SUCCINATE ER 25 MG PO TB24: 12.5000 mg | ORAL_TABLET | Freq: Every day | ORAL | 2 refills | Status: DC

## 2021-09-12 NOTE — Progress Notes (Signed)
All set for discharge, 3in1 will be delivered  at home as per CM.

## 2021-09-12 NOTE — Discharge Summary (Signed)
Physician Discharge Summary   Patient: Tiffany Wiggins MRN: [id number] DOB: 1946/02/22  Admit date:     09/06/2021  Discharge date: 09/12/21  Discharge Physician: Cordelia Poche, MD   PCP: Raina Mina., MD   Recommendations at discharge:  Follow-up with PCP BMP in 2 days (discussed with patient) Follow-up with cardiology Follow-up with orthopedic surgery  Discharge Diagnoses: Principal Problem:   Primary osteoarthritis of left hip Active Problems:   NSTEMI (non-ST elevated myocardial infarction) ([place])   Moderate late onset Alzheimer's dementia ([place])   Hip arthritis   Paroxysmal A-fib (HCC)   Stage 3a chronic kidney disease (HCC)   S/P total left hip arthroplasty   Diabetes mellitus type 2, controlled, with complications ([place])   AKI (acute kidney injury) ([place])   Acute systolic heart failure [facility])  Hospital Course: Tiffany Wiggins is a 76 y.o. female with a history of paroxysmal atrial fibrillation, hypertension, atrial fibrillation, dementia, diabetes mellitus type 2. Patient presented secondary to osteoarthritis and planned total left hip arthroplasty. Post-operatively, patient developed chest pain. Troponin >24,000 without ischemic changes on EKG consistent with post-operative NSTEMI. Heparin IV started. Cardiology consulted and performed cardiac catheterization on 6/12 without evidence of ischemic disease.  Assessment and Plan: * Primary osteoarthritis of left hip Patient is s/p left total hip arthroplasty on 6/7. Weight bearing as tolerated. Orthopedic surgery follow-up.  NSTEMI (non-ST elevated myocardial infarction) ([place]) Postoperative NSTEMI. Chest pain with troponin >24,000. Heparin IV started and cardiology consulted. Transthoracic Echocardiogram significant for a reduced LVEF of 30-35% with regional wall motion abnormalities. Cardiac catheterization without ischemic disease. Patient discharged on Toprol XL and Crestor.  Moderate late onset Alzheimer's dementia  ([place]) Noted. Delirium risk. Not on medication management.  S/P total left hip arthroplasty Management per ortho.  Stage 3a chronic kidney disease (HCC) Baseline creatinine of 1.1-1.2.  Paroxysmal A-fib [facility]) Patient is not on rate/antiarrhythmic medication management as an outpatient. She uses propranolol only as PRN. She is managed on Eliquis 5 mg BID. Transitioned to heparin IV secondary to NSTEMI. Rate controlled and NSR. Resume home Eliquis and metoprolol.  Acute systolic heart failure (HCC) No acute symptoms. In setting of NSTEMI. LVEF of 30-35%. Continue losartan, Toprol XL and Lasix.  AKI (acute kidney injury) ([place]) Initial peak of 1.55 with improvement back towards baseline. Creatinine of 1.71 on day of discharged. Discussed with cardiology who recommended continuing losartan and repeating BMP in two days. Discussed with patient.  Diabetes mellitus type 2, controlled, with complications ([place]) Hemoglobin A1C of 5.8%. Diet controlled. Started on SSI while inpatient. Continue carb modified diet.    Consultants: Orthopedic surgery, Cardiology Procedures performed: Left total hip arthroplasty, Transthoracic Echocardiogram (6/10), Heart catheterization (6/12)  Disposition: Home Diet recommendation: Cardiac/carb modified diet  DISCHARGE MEDICATION: Allergies as of 09/12/2021       Reactions   Codeine Nausea Only   Morphine And Related Itching        Medication List     STOP taking these medications    propranolol 10 MG tablet Commonly known as: INDERAL       TAKE these medications    amLODipine 5 MG tablet Commonly known as: NORVASC Take 1 tablet (5 mg total) by mouth daily.   apixaban 5 MG Tabs tablet Commonly known as: ELIQUIS Take 1 tablet (5 mg total) by mouth 2 (two) times daily.   azelastine 0.1 % nasal spray Commonly known as: ASTELIN Place 2 sprays into both nostrils as needed for rhinitis or allergies.  CoQ10 100 MG Caps Take 100 mg by mouth  daily.   Ferrous Sulfate 90 (18 Fe) MG Tabs Take 18 mg by mouth daily.   furosemide 20 MG tablet Commonly known as: LASIX Take 1 tablet (20 mg total) by mouth daily.   isosorbide mononitrate 60 MG 24 hr tablet Commonly known as: IMDUR Take 1 tablet (60 mg total) by mouth daily.   losartan 50 MG tablet Commonly known as: COZAAR Take 1 tablet by mouth once daily   methocarbamol 500 MG tablet Commonly known as: ROBAXIN Take 1 tablet (500 mg total) by mouth every 6 (six) hours as needed for muscle spasms.   metoprolol succinate 25 MG 24 hr tablet Commonly known as: TOPROL-XL Take 0.5 tablets (12.5 mg total) by mouth daily. Start taking on: September 13, 2021   nitroGLYCERIN 0.4 MG SL tablet Commonly known as: NITROSTAT Place 1 tablet (0.4 mg total) under the tongue every 5 (five) minutes as needed for chest pain.   OVER THE COUNTER MEDICATION Take 2 tablets by mouth daily. Memory and Nerve Support   rosuvastatin 20 MG tablet Commonly known as: CRESTOR Take 1 tablet (20 mg total) by mouth daily. Start taking on: September 13, 2021 What changed:  medication strength how much to take   traMADol 50 MG tablet Commonly known as: ULTRAM Take 1 tablet (50 mg total) by mouth every 6 (six) hours as needed for moderate pain.   Vitamin D 125 MCG (5000 UT) Caps Take 5,000 Units by mouth daily.   Zinc 50 MG Tabs Take 50 mg by mouth daily.               Discharge Care Instructions  (From admission, onward)           Start     Ordered   09/12/21 0000  Weight bearing as tolerated        09/12/21 1139   09/12/21 0000  Change dressing       Comments: You may remove the bulky bandage (ACE wrap and gauze) two days after surgery. You will have an adhesive waterproof bandage underneath. Leave this in place until your first follow-up appointment.   09/12/21 1139            Follow-up Information     Tiffany Arabian, MD. Schedule an appointment as soon as possible for a visit  in 2 week(s).   Specialty: Orthopedic Surgery Contact information: [address] [place] [address] [place] [address] [phone]         Raina Mina., MD. Schedule an appointment as soon as possible for a visit in 2 day(s).   Specialty: Internal Medicine Why: Labs (metabolic panel) and for hospital follow-up Contact information: [address] [place] [address] [phone]         Park Liter, MD. Schedule an appointment as soon as possible for a visit in 3 month(s).   Specialty: Cardiology Why: For hospital follow-up Contact information: [place] [address] [phone]                Discharge Exam: BP (!) 126/47 (BP Location: Left Arm)   Pulse (!) 54   Temp 98.1 F (36.7 C) (Oral)   Resp 20   Ht '5\' 6"'$  (1.676 m)   Wt 72.6 kg   SpO2 100%   BMI 25.83 kg/m   General exam: Appears calm and comfortable Respiratory system: Clear to auscultation. Respiratory effort normal. Cardiovascular system: S1 &  S2 heard, RRR. Gastrointestinal system: Abdomen is nondistended, soft and nontender. Normal bowel sounds heard. Central nervous system: Alert. No focal neurological deficits. Musculoskeletal: No edema. No calf tenderness  Condition at discharge: stable  The results of significant diagnostics from this hospitalization (including imaging, microbiology, ancillary and laboratory) are listed below for reference.   Imaging Studies: CARDIAC CATHETERIZATION  Result Date: 09/11/2021 Conclusions: Mild plaquing of LMCA and mid LAD.  No significant atherosclerotic coronary artery disease identified to explain the patient's NSTEMI and cardiomyopathy. Normal left ventricular filling pressure (LVEDP 13 mmHg). Recommendations: Continue medical therapy for MI with nonobstructive coronary arteries ([place]). Escalate goal-directed medical therapy for nonischemic cardiomyopathy, as tolerated. Nelva Bush, MD [facility]  [facility]  ECHOCARDIOGRAM COMPLETE  Result Date: 09/09/2021    ECHOCARDIOGRAM REPORT   Patient Name:   TAMETHA BANNING Date of Exam: 09/09/2021 Medical Rec #:  [id number]     Height:       66.0 in Accession #:    [id number]    Weight:       163.8 lb Date of Birth:  10-22-1945    BSA:          1.837 m Patient Age:    6 years      BP:           133/59 mmHg Patient Gender: F             HR:           74 bpm. Exam Location:  Inpatient Procedure: 2D Echo, Cardiac Doppler, Color Doppler and Intracardiac            Opacification Agent Indications:    Elevated troponin  History:        Patient has prior history of Echocardiogram examinations, most                 recent 07/19/2021. Risk Factors:Hypertension and Diabetes.  Sonographer:    Jefferey Pica Referring Phys: [place]  1. Left ventricular ejection fraction, by estimation, is 30 to 35%. The left ventricle has moderately decreased function. The left ventricle demonstrates regional wall motion abnormalities (see scoring diagram/findings for description). There is mild concentric left ventricular hypertrophy. Left ventricular diastolic parameters are indeterminate.  2. Right ventricular systolic function is normal. The right ventricular size is normal. There is normal pulmonary artery systolic pressure.  3. Left atrial size was mildly dilated.  4. The mitral valve is normal in structure. Trivial mitral valve regurgitation. No evidence of mitral stenosis.  5. The aortic valve is grossly normal. Aortic valve regurgitation is not visualized. No aortic stenosis is present.  6. The inferior vena cava is dilated in size with >50% respiratory variability, suggesting right atrial pressure of 8 mmHg. Comparison(s): Changes from prior study are noted. Conclusion(s)/Recommendation(s): Findings consistent with ischemic cardiomyopathy. New wall motion abnormalities, most severe in lateral wall and with hypokinesis in the inferior wall. Distribution concerning  for circumflex ischemia. Findings communicated with Dr. Wynelle Cleveland. FINDINGS  Left Ventricle: Left ventricular ejection fraction, by estimation, is 30 to 35%. The left ventricle has moderately decreased function. The left ventricle demonstrates regional wall motion abnormalities. Definity contrast agent was given IV to delineate the left ventricular endocardial borders. The left ventricular internal cavity size was normal in size. There is mild concentric left ventricular hypertrophy. Left ventricular diastolic parameters are indeterminate.  LV Wall Scoring: The entire lateral wall is akinetic. The entire inferior wall is hypokinetic. The entire anterior wall, entire septum, and apex  are normal. Right Ventricle: The right ventricular size is normal. No increase in right ventricular wall thickness. Right ventricular systolic function is normal. There is normal pulmonary artery systolic pressure. The tricuspid regurgitant velocity is 2.58 m/s, and  with an assumed right atrial pressure of 8 mmHg, the estimated right ventricular systolic pressure is 16.1 mmHg. Left Atrium: Left atrial size was mildly dilated. Right Atrium: Right atrial size was normal in size. Pericardium: There is no evidence of pericardial effusion. Mitral Valve: The mitral valve is normal in structure. Trivial mitral valve regurgitation. No evidence of mitral valve stenosis. Tricuspid Valve: The tricuspid valve is normal in structure. Tricuspid valve regurgitation is mild . No evidence of tricuspid stenosis. Aortic Valve: The aortic valve is grossly normal. Aortic valve regurgitation is not visualized. No aortic stenosis is present. Aortic valve peak gradient measures 7.6 mmHg. Pulmonic Valve: The pulmonic valve was grossly normal. Pulmonic valve regurgitation is trivial. No evidence of pulmonic stenosis. Aorta: The aortic root, ascending aorta, aortic arch and descending aorta are all structurally normal, with no evidence of dilitation or obstruction.  Venous: The inferior vena cava is dilated in size with greater than 50% respiratory variability, suggesting right atrial pressure of 8 mmHg. IAS/Shunts: The atrial septum is grossly normal.  LEFT VENTRICLE PLAX 2D LVIDd:         5.15 cm     Diastology LVIDs:         4.10 cm     LV e' medial:    4.12 cm/s LV PW:         1.25 cm     LV E/e' medial:  17.4 LV IVS:        1.20 cm     LV e' lateral:   3.81 cm/s LVOT diam:     1.85 cm     LV E/e' lateral: 18.8 LV SV:         65 LV SV Index:   36 LVOT Area:     2.69 cm  LV Volumes (MOD) LV vol d, MOD A4C: 86.3 ml LV vol s, MOD A4C: 59.4 ml LV SV MOD A4C:     86.3 ml RIGHT VENTRICLE             IVC RV S prime:     13.00 cm/s  IVC diam: 2.30 cm TAPSE (M-mode): 2.2 cm LEFT ATRIUM             Index        RIGHT ATRIUM           Index LA diam:        3.80 cm 2.07 cm/m   RA Area:     12.30 cm LA Vol (A2C):   48.6 ml 26.45 ml/m  RA Volume:   30.20 ml  16.44 ml/m LA Vol (A4C):   61.2 ml 33.31 ml/m LA Biplane Vol: 56.0 ml 30.48 ml/m  AORTIC VALVE                 PULMONIC VALVE AV Area (Vmax): 2.14 cm     PV Vmax:       0.86 m/s AV Vmax:        138.00 cm/s  PV Peak grad:  2.9 mmHg AV Peak Grad:   7.6 mmHg LVOT Vmax:      110.00 cm/s LVOT Vmean:     73.100 cm/s LVOT VTI:       0.243 m  AORTA Ao Root diam: 3.00 cm Ao Asc  diam:  2.70 cm MITRAL VALVE                TRICUSPID VALVE MV Area (PHT): 3.37 cm     TR Peak grad:   26.6 mmHg MV Decel Time: 225 msec     TR Vmax:        258.00 cm/s MV E velocity: 71.60 cm/s MV A velocity: 111.00 cm/s  SHUNTS MV E/A ratio:  0.65         Systemic VTI:  0.24 m                             Systemic Diam: 1.85 cm Buford Dresser MD Electronically signed by Buford Dresser MD Signature Date/Time: 09/09/2021/9:53:38 AM    Final    DG CHEST PORT 1 VIEW  Result Date: 09/09/2021 CLINICAL DATA:  Elevated cardiac enzymes EXAM: PORTABLE CHEST 1 VIEW COMPARISON:  12/08/2018 FINDINGS: Mild cardiomegaly. No confluent airspace opacities or  effusions. No acute bony abnormality. IMPRESSION: Cardiomegaly.  No active disease. Electronically Signed   By: Rolm Baptise M.D.   On: 09/09/2021 03:27   DG Pelvis Portable  Result Date: 09/06/2021 CLINICAL DATA:  Post LEFT hip arthroplasty EXAM: PORTABLE PELVIS 1-2 VIEWS COMPARISON:  Portable exam 1156 hours compared to 12/20/2016 FINDINGS: LEFT hip prosthesis newly identified. Bones demineralized. Advanced degenerative changes of RIGHT hip joint. No fracture, dislocation or bone destruction identified on single AP view. IMPRESSION: LEFT hip prosthesis without acute complication. Osseous demineralization with degenerative changes RIGHT hip joint. Electronically Signed   By: Lavonia Dana M.D.   On: 09/06/2021 13:04   DG HIP UNILAT WITH PELVIS 1V LEFT  Result Date: 09/06/2021 CLINICAL DATA:  Left hip replacement. EXAM: DG HIP (WITH OR WITHOUT PELVIS) 1V*L* COMPARISON:  Left hip x-rays dated December 20, 2016. FLUOROSCOPY TIME:  Radiation Exposure Index (as provided by the fluoroscopic device): 1.18 mGy Kerma C-arm fluoroscopic images were obtained intraoperatively and submitted for post operative interpretation. FINDINGS: Intraoperative fluoroscopic images demonstrate interval left total hip arthroplasty. Components are well aligned. No acute osseous abnormality. Unchanged moderate right hip osteoarthritis. IMPRESSION: 1. Interval left total hip arthroplasty. Electronically Signed   By: Titus Dubin M.D.   On: 09/06/2021 11:51   DG C-Arm 1-60 Min-No Report  Result Date: 09/06/2021 Fluoroscopy was utilized by the requesting physician.  No radiographic interpretation.   DG C-Arm 1-60 Min-No Report  Result Date: 09/06/2021 Fluoroscopy was utilized by the requesting physician.  No radiographic interpretation.   MYOCARDIAL PERFUSION IMAGING  Result Date: 08/15/2021   Findings are consistent with no  ischemia and no prior myocardial infarction. The study is low risk.   No ST deviation was noted.   Left  ventricular function is normal. Nuclear stress EF: 78 %. The left ventricular ejection fraction is hyperdynamic (>65%). End diastolic cavity size is normal.   Prior study available for comparison from 06/17/2020.    Microbiology: Results for orders placed or performed during the hospital encounter of 08/24/21  Surgical pcr screen     Status: None   Collection Time: 08/24/21  2:47 PM   Specimen: Nasal Mucosa; Nasal Swab  Result Value Ref Range Status   MRSA, PCR NEGATIVE NEGATIVE Final   Staphylococcus aureus NEGATIVE NEGATIVE Final    Comment: (NOTE) The Xpert SA Assay (FDA approved for NASAL specimens in patients 85 years of age and older), is one component of a comprehensive surveillance program. It is not intended  to diagnose infection nor to guide or monitor treatment. Performed at [facility], [place] [address] [place], [place] [address]     Labs: CBC: Recent Labs  Lab 09/08/21 [phone] 09/10/21 0544 09/10/21 1426 09/11/21 0105 09/12/21 0546  WBC 18.9* 9.2 9.6 8.9 8.5  NEUTROABS  --  7.2  --   --   --   HGB 10.6* 8.7* 8.3* 8.7* 8.6*  HCT 32.9* 26.8* 25.5* 27.0* 26.5*  MCV 101.5* 99.3 100.0 100.4* 99.3  PLT 164 127* 132* 126* 834*   Basic Metabolic Panel: Recent Labs  Lab 09/07/21 0343 09/09/21 0041 09/10/21 0544 09/11/21 0938 09/12/21 0546  NA 141 143 140 141 140  K 4.7 3.8 3.3* 3.4* 4.7  CL 103 107 103 107 108  CO2 '28 27 27 23 23  '$ GLUCOSE 153* 185* 140* 126* 117*  BUN 32* 34* 36* 37* 45*  CREATININE 1.55* 1.39* 1.47* 1.48* 1.71*  CALCIUM 9.4 9.4 8.7* 8.6* 8.6*    CBG: Recent Labs  Lab 09/11/21 1130 09/11/21 1512 09/11/21 2149 09/12/21 0605 09/12/21 1141  GLUCAP 133* 113* 141* 112* 143*    Discharge time spent: 35 minutes.  Signed: Cordelia Poche, MD [facility] 09/12/2021

## 2021-09-12 NOTE — Care Management Important Message (Signed)
Important Message  Patient Details  Name: Tiffany Wiggins MRN: [id number] Date of Birth: 1946-03-23   Medicare Important Message Given:  Yes Patient left prior to IM delivery will mail to the patient home address.    Emrik Erhard 09/12/2021, 2:34 PM

## 2021-09-12 NOTE — Progress Notes (Signed)
Physical Therapy Treatment Patient Details Name: Tiffany Wiggins MRN: [id number] DOB: December 19, 1945 Today's Date: 09/12/2021   History of Present Illness Pt is a 76 yo female presenting 09/06/21 for L anterior approach THA. Rapid Response called for decreased level of consciousness 6/8 and called for chest pain 6/9. Troponin >24,000 without ischemic changes on EKG consistent with post-operative NSTEMI. Transferred to [facility] 6/10. S/p cardiac cath 6/12. PMH: hx of CVA, DM, HLD, HTN, IBS, hx of Tia, lumbar L4-L5 decompression, adominal exploratory laparotomy 2017, loop recorder implant 2015, PAF, Alzheimer's dementia.    PT Comments    Session focused on pt and husband education on stairs, HEP, assisting pt as needed (with safe techniques) with all tasks/mobility, safe placement and use of bedside commode at night, and car transfers. Provided handouts on stairs and HEP for THA. Pt's husband reporting understanding. Pt was able to ambulate and perform transfers at a min guard assist level with a RW today. Her balance continues to improve. Will continue to follow acutely. Current recommendations remain appropriate.    Recommendations for follow up therapy are one component of a multi-disciplinary discharge planning process, led by the attending physician.  Recommendations may be updated based on patient status, additional functional criteria and insurance authorization.  Follow Up Recommendations  Follow physician's recommendations for discharge plan and follow up therapies     Assistance Recommended at Discharge Frequent or constant Supervision/Assistance  Patient can return home with the following A little help with walking and/or transfers;A little help with bathing/dressing/bathroom;Assistance with cooking/housework;Assist for transportation;Help with stairs or ramp for entrance;Direct supervision/assist for medications management;Direct supervision/assist for financial management   Equipment  Recommendations  BSC/3in1    Recommendations for Other Services       Precautions / Restrictions Precautions Precautions: Fall Precaution Comments: R UE access cardiac cath 6/12 Restrictions Weight Bearing Restrictions: Yes LLE Weight Bearing: Weight bearing as tolerated     Mobility  Bed Mobility Overal bed mobility: Needs Assistance Bed Mobility: Sit to Supine, Rolling, Sidelying to Sit Rolling: Min assist Sidelying to sit: Min assist   Sit to supine: Min assist   General bed mobility comments: Bed flat and rails down to simulate home. Cues to roll to L to avoid excessive use of R UE s/p cardiac cath, minA. MinA to bring legs off EOB and ascend trunk, cuing pt to push up with L UE on bed. MinA to control trunk back to supine and sit up again, educating pt's husband on how to safely assist her as needed.    Transfers Overall transfer level: Needs assistance Equipment used: Rolling walker (2 wheels) Transfers: Sit to/from Stand Sit to Stand: Min guard           General transfer comment: Cues to scoot anteriorly and push up from bed, min guard for safety    Ambulation/Gait Ambulation/Gait assistance: Min guard Gait Distance (Feet): 275 Feet Assistive device: Rolling walker (2 wheels) Gait Pattern/deviations: Step-through pattern, Decreased stride length, Decreased dorsiflexion - left, Antalgic, Shuffle, Narrow base of support, Trunk flexed Gait velocity: decreased Gait velocity interpretation: <1.31 ft/sec, indicative of household ambulator   General Gait Details: Pt with slow but steady gait with min guard for safety, no LOB.   Stairs         General stair comments: reviewed verbally and with demonstration how to guard pt and sequencing of feet on stairs, provided handout   Wheelchair Mobility    Modified Rankin (Stroke Patients Only)  Balance Overall balance assessment: Needs assistance Sitting-balance support: Feet supported, No upper  extremity supported Sitting balance-Leahy Scale: Fair     Standing balance support: No upper extremity supported, Bilateral upper extremity supported, During functional activity Standing balance-Leahy Scale: Fair Standing balance comment: Able to stand without UE support but benefits from RW for gait                            Cognition Arousal/Alertness: Awake/alert Behavior During Therapy: WFL for tasks assessed/performed, Flat affect Overall Cognitive Status: Impaired/Different from baseline Area of Impairment: Memory, Attention, Problem solving                   Current Attention Level: Sustained Memory: Decreased short-term memory       Problem Solving: Slow processing, Difficulty sequencing, Requires verbal cues General Comments: Pt has history of stroke, dementia, poor short-term memory. Pt random in conversation today, husband reporting pt's mental status is worse today. Needs cues to sequence tasks at times, poor problem-solving        Exercises      General Comments General comments (skin integrity, edema, etc.): handouts provided for THA HEP and stairs; educated pt's husband on car transfers, using folded non-slippery blankets to lift chair seats some if needed, and placement of bedside commode near bed with night light on for night bathroom emergencies safety      Pertinent Vitals/Pain Pain Assessment Pain Assessment: Faces Faces Pain Scale: Hurts little more Pain Location: left hip Pain Descriptors / Indicators: Operative site guarding, Discomfort, Grimacing, Guarding, Sore Pain Intervention(s): Monitored during session, Limited activity within patient's tolerance, Repositioned    Home Living                          Prior Function            PT Goals (current goals can now be found in the care plan section) Acute Rehab PT Goals Patient Stated Goal: to go home PT Goal Formulation: With patient/family Time For Goal  Achievement: 09/24/21 Potential to Achieve Goals: Good Progress towards PT goals: Progressing toward goals    Frequency    7X/week      PT Plan Current plan remains appropriate    Co-evaluation              AM-PAC PT "6 Clicks" Mobility   Outcome Measure  Help needed turning from your back to your side while in a flat bed without using bedrails?: A Little Help needed moving from lying on your back to sitting on the side of a flat bed without using bedrails?: A Little Help needed moving to and from a bed to a chair (including a wheelchair)?: A Little Help needed standing up from a chair using your arms (e.g., wheelchair or bedside chair)?: A Little Help needed to walk in hospital room?: A Little Help needed climbing 3-5 steps with a railing? : A Little 6 Click Score: 18    End of Session Equipment Utilized During Treatment: Gait belt Activity Tolerance: Patient tolerated treatment well Patient left: in chair;with call bell/phone within reach;with family/visitor present;with nursing/sitter in room Nurse Communication: Mobility status PT Visit Diagnosis: Difficulty in walking, not elsewhere classified (R26.2);Pain;Unsteadiness on feet (R26.81);Other abnormalities of gait and mobility (R26.89);Muscle weakness (generalized) (M62.81) Pain - Right/Left: Left Pain - part of body: Hip     Time: 1106-1150 PT Time Calculation (min) (ACUTE ONLY): 44  min  Charges:  $Gait Training: 8-22 mins $Therapeutic Activity: 23-37 mins                     Moishe Spice, PT, DPT Acute Rehabilitation Services  Office: [place] 09/12/2021, 11:59 AM

## 2021-09-12 NOTE — Progress Notes (Addendum)
Progress Note  Patient Name: Tiffany Wiggins Date of Encounter: 09/12/2021  [facility] Cardiologist: Jenne Campus, MD   Subjective   Pt denies CP or dyspnea; confused; alert and oriented x person only  Inpatient Medications    Scheduled Meds:  aspirin EC  81 mg Oral Daily   docusate sodium  100 mg Oral BID   ferrous sulfate  325 mg Oral Daily   heparin  5,000 Units Subcutaneous Q8H   insulin aspart  0-15 Units Subcutaneous TID WC   losartan  50 mg Oral Daily   mouth rinse  15 mL Mouth Rinse BID   metoprolol succinate  12.5 mg Oral Daily   rosuvastatin  20 mg Oral Daily   sodium chloride flush  3 mL Intravenous Q12H   sodium chloride flush  3 mL Intravenous Q12H   Continuous Infusions:  sodium chloride     methocarbamol (ROBAXIN) IV     PRN Meds: sodium chloride, acetaminophen, bisacodyl, lip balm, LORazepam, melatonin, menthol-cetylpyridinium **OR** phenol, methocarbamol **OR** methocarbamol (ROBAXIN) IV, metoCLOPramide **OR** metoCLOPramide (REGLAN) injection, naLOXone (NARCAN)  injection, naloxone, nitroGLYCERIN, ondansetron **OR** ondansetron (ZOFRAN) IV, polyethylene glycol, sodium chloride flush, traMADol   Vital Signs    Vitals:   09/11/21 1719 09/11/21 1941 09/11/21 2346 09/12/21 0425  BP: (!) 107/49 (!) 125/43 (!) 86/57 124/69  Pulse: (!) 48 (!) 54 (!) 58 64  Resp: '16 14 17 18  '$ Temp:  98.4 F (36.9 C) 98.3 F (36.8 C) 98.2 F (36.8 C)  TempSrc:  Oral Axillary Oral  SpO2: 100% 98% 92% 100%  Weight:      Height:        Intake/Output Summary (Last 24 hours) at 09/12/2021 0723 Last data filed at 09/12/2021 0425 Gross per 24 hour  Intake 402.24 ml  Output --  Net 402.24 ml       09/11/2021    5:13 AM 09/06/2021    2:00 PM 09/06/2021    9:46 AM  Last 3 Weights  Weight (lbs) 160 lb 0.9 oz 163 lb 12.8 oz 164 lb  Weight (kg) 72.6 kg 74.3 kg 74.39 kg      Telemetry    Sinus bradycardia - Personally Reviewed  Physical Exam   GEN: NAD Neck:  Supple Cardiac: RRR Respiratory: CTA GI: Soft, NT/ND MS: No edema; s/p hip surgery; radial cath site with no hematoma Neuro:  Moves all ext; confused Psych: Confused  Labs    High Sensitivity Troponin:   Recent Labs  Lab 09/09/21 0041 09/09/21 0318  TROPONINIHS >24,000* >24,000*      Chemistry Recent Labs  Lab 09/10/21 0544 09/11/21 0938 09/12/21 0546  NA 140 141 140  K 3.3* 3.4* 4.7  CL 103 107 108  CO2 '27 23 23  '$ GLUCOSE 140* 126* 117*  BUN 36* 37* 45*  CREATININE 1.47* 1.48* 1.71*  CALCIUM 8.7* 8.6* 8.6*  GFRNONAA 37* 37* 31*  ANIONGAP '10 11 9     '$ Lipids  Recent Labs  Lab 09/09/21 0318  CHOL 159  TRIG 71  HDL 81  LDLCALC 64  CHOLHDL 2.0     Hematology Recent Labs  Lab 09/10/21 1426 09/11/21 0105 09/12/21 0546  WBC 9.6 8.9 8.5  RBC 2.55* 2.69* 2.67*  HGB 8.3* 8.7* 8.6*  HCT 25.5* 27.0* 26.5*  MCV 100.0 100.4* 99.3  MCH 32.5 32.3 32.2  MCHC 32.5 32.2 32.5  RDW 12.1 12.1 12.4  PLT 132* 126* 131*     Radiology    CARDIAC  CATHETERIZATION  Result Date: 09/11/2021 Conclusions: Mild plaquing of LMCA and mid LAD.  No significant atherosclerotic coronary artery disease identified to explain the patient's NSTEMI and cardiomyopathy. Normal left ventricular filling pressure (LVEDP 13 mmHg). Recommendations: Continue medical therapy for MI with nonobstructive coronary arteries (MINOCA). Escalate goal-directed medical therapy for nonischemic cardiomyopathy, as tolerated. Nelva Bush, MD [facility]     Patient Profile     76 y.o. female with past medical history of coronary artery disease, prior CVA, diabetes mellitus, hypertension, hyperlipidemia, irritable bowel syndrome, peptic ulcer disease for evaluation of postoperative myocardial infarction.  Patient had left total hip replacement on June 7.  Following her surgery she developed confusion/sundowning.  She then had an episode of chest pain and troponin greater than 24,000.  Echocardiogram  shows ejection fraction 30 to 35%, mild left ventricular hypertrophy, mild left atrial enlargement.  Assessment & Plan    1 status post myocardial infarction-cardiac catheterization yesterday revealed mild plaque in the left main and LAD but no obstructive disease.  Normal LVEDP.  Question Takotsubo variant.  Plan medical therapy.  Continue statin, low-dose Toprol (patient mildy bradycardic) and losartan.  Discontinue aspirin as she needs to resume apixaban at discharge for history of paroxysmal atrial fibrillation and she did not require coronary intervention.  Would plan follow-up echocardiogram in 4 to 6 weeks to reassess LV function.  Hopefully will have normalized.  2 ischemic cardiomyopathy-ejection fraction 30 to 35%.  Hopefully this will improve with medical therapy.  As above plan repeat echocardiogram in 4 to 6 weeks.  Continue low-dose Toprol and ARB.  If LV function does not improve will transition to [facility] as an outpatient.  3 acute kidney injury-creatinine 1.71 today.  Would recheck on Thursday.  4 hypertension-continue present blood pressure medications.  5 hyperlipidemia-continue statin.  6 status post hip replacement-follow-up orthopedics.  7 history of paroxysmal atrial fibrillation-she is in sinus rhythm.  We will continue low-dose metoprolol.  Resume apixaban when ok with orthopedics.  Patient can be discharged from a cardiac standpoint with plan as outlined above.  We will arrange follow-up with APP 2 to 4 weeks after discharge.  Plan repeat echocardiogram 4 to 6 weeks.  Follow-up with Dr. Agustin Cree 3 months.  Please call with questions.  For questions or updates, please contact [place] Please consult [email or web address] for contact info under        Signed, Kirk Ruths, MD  09/12/2021, 7:23 AM

## 2021-09-12 NOTE — Progress Notes (Signed)
Physical Therapy Treatment Patient Details Name: Tiffany Wiggins MRN: [id number] DOB: Nov 21, 1945 Today's Date: 09/12/2021   History of Present Illness Pt is a 76 yo female presenting 09/06/21 for L anterior approach THA. Rapid Response called for decreased level of consciousness 6/8 and called for chest pain 6/9. Troponin >24,000 without ischemic changes on EKG consistent with post-operative NSTEMI. Transferred to [facility] 6/10. S/p cardiac cath 6/12. PMH: hx of CVA, DM, HLD, HTN, IBS, hx of Tia, lumbar L4-L5 decompression, adominal exploratory laparotomy 2017, loop recorder implant 2015, PAF, Alzheimer's dementia.    PT Comments    Pt about to leave hospital to d/c home, standing in room with NT upon arrival. Husband requested PT try to mobilize pt while he retrieved the car. Pt was agreeable to ambulate a short distance in the hall without UE support, needing up to minA to prevent LOB, but then became focused on waiting for her husband and was unable to redirect to progress her further. Current recommendations remain appropriate. Will continue to follow acutely.     Recommendations for follow up therapy are one component of a multi-disciplinary discharge planning process, led by the attending physician.  Recommendations may be updated based on patient status, additional functional criteria and insurance authorization.  Follow Up Recommendations  Follow physician's recommendations for discharge plan and follow up therapies     Assistance Recommended at Discharge Frequent or constant Supervision/Assistance  Patient can return home with the following A little help with walking and/or transfers;A little help with bathing/dressing/bathroom;Assistance with cooking/housework;Assist for transportation;Help with stairs or ramp for entrance;Direct supervision/assist for medications management;Direct supervision/assist for financial management   Equipment Recommendations  BSC/3in1    Recommendations for Other  Services       Precautions / Restrictions Precautions Precautions: Fall Precaution Comments: R UE access cardiac cath 6/12 Restrictions Weight Bearing Restrictions: Yes LLE Weight Bearing: Weight bearing as tolerated     Mobility  Bed Mobility    General bed mobility comments: Pt standing in room with NT upon arrival.    Transfers            General transfer comment: Pt standing in room with NT upon arrival.    Ambulation/Gait Ambulation/Gait assistance: Min guard, Min assist Gait Distance (Feet): 50 Feet Assistive device: None Gait Pattern/deviations: Step-through pattern, Decreased stride length, Decreased dorsiflexion - left, Antalgic, Shuffle, Narrow base of support, Trunk flexed Gait velocity: decreased Gait velocity interpretation: <1.31 ft/sec, indicative of household ambulator   General Gait Details: Pt with slow, antalgic gait pattern, displaying instability with intermittent need for minA for safety. Pt not wanting to ambulate further due to perseverating on her husband going to get the car for d/c. Able to direct her to w/c though.   Stairs           Wheelchair Mobility    Modified Rankin (Stroke Patients Only)       Balance Overall balance assessment: Needs assistance Sitting-balance support: Feet supported, No upper extremity supported Sitting balance-Leahy Scale: Fair     Standing balance support: No upper extremity supported, During functional activity Standing balance-Leahy Scale: Fair Standing balance comment: Able to take steps without UE support, but displays instability needing up to minA to prevent LOB                            Cognition Arousal/Alertness: Awake/alert Behavior During Therapy: WFL for tasks assessed/performed, Flat affect Overall Cognitive Status: Impaired/Different from baseline Area  of Impairment: Memory, Attention, Problem solving                   Current Attention Level:  Sustained Memory: Decreased short-term memory       Problem Solving: Slow processing, Difficulty sequencing, Requires verbal cues General Comments: Pt perseverating on her going home and wanting to walk down to the car now, unable to redirect to participate further with therapy.        Exercises      General Comments       Pertinent Vitals/Pain Pain Assessment Pain Assessment: Faces Faces Pain Scale: Hurts little more Pain Location: left hip Pain Descriptors / Indicators: Operative site guarding, Discomfort, Grimacing, Guarding, Sore Pain Intervention(s): Limited activity within patient's tolerance, Monitored during session, Repositioned    Home Living                          Prior Function            PT Goals (current goals can now be found in the care plan section) Acute Rehab PT Goals Patient Stated Goal: to go home PT Goal Formulation: With patient/family Time For Goal Achievement: 09/24/21 Potential to Achieve Goals: Good Progress towards PT goals: Progressing toward goals    Frequency    7X/week      PT Plan Current plan remains appropriate    Co-evaluation              AM-PAC PT "6 Clicks" Mobility   Outcome Measure  Help needed turning from your back to your side while in a flat bed without using bedrails?: A Little Help needed moving from lying on your back to sitting on the side of a flat bed without using bedrails?: A Little Help needed moving to and from a bed to a chair (including a wheelchair)?: A Little Help needed standing up from a chair using your arms (e.g., wheelchair or bedside chair)?: A Little Help needed to walk in hospital room?: A Little Help needed climbing 3-5 steps with a railing? : A Little 6 Click Score: 18    End of Session Equipment Utilized During Treatment: Gait belt Activity Tolerance: Patient tolerated treatment well Patient left: in chair;with nursing/sitter in room;Other (comment) (in w/c) Nurse  Communication: Mobility status PT Visit Diagnosis: Difficulty in walking, not elsewhere classified (R26.2);Pain;Unsteadiness on feet (R26.81);Other abnormalities of gait and mobility (R26.89);Muscle weakness (generalized) (M62.81) Pain - Right/Left: Left Pain - part of body: Hip     Time: 6440-3474 PT Time Calculation (min) (ACUTE ONLY): 8 min  Charges:  $Gait Training: 8-22 mins                    Moishe Spice, PT, DPT Acute Rehabilitation Services  Office: [phone]    Orvan Falconer 09/12/2021, 2:56 PM

## 2021-09-12 NOTE — TOC Transition Note (Signed)
Transition of Care [facility]) - CM/SW Discharge Note   Patient Details  Name: Tiffany Wiggins MRN: [id number] Date of Birth: [birth year]  [facility] of [facility]) CM/SW Contact:  Angelita Ingles, RN Phone Number:[phone]  09/12/2021, 11:55 AM   Clinical Narrative:    3in1 has been ordered per [place]. Family and patient agreeable to have DME delivered to the home. No other needs noted at this time.   Final next level of care: Home/Self Care Barriers to Discharge: No Barriers Identified   Patient Goals and CMS Choice Patient states their goals for this hospitalization and ongoing recovery are:: Discharge home with HEP CMS Medicare.gov Compare Post Acute Care list provided to:: Patient Represenative (must comment) Choice offered to / list presented to : Spouse  Discharge Placement                       Discharge Plan and Services                DME Arranged: Walker rolling DME Agency: [employer] Date DME Agency Contacted: 09/07/21   Representative spoke with at DME Agency: [facility] Determinants of Health ([place]) Interventions     Readmission Risk Interventions     No data to display

## 2021-09-13 LAB — LIPOPROTEIN A (LPA): Lipoprotein (a): 97.3 nmol/L — ABNORMAL HIGH (ref <75.0)

## 2021-10-20 ENCOUNTER — Encounter: Payer: Self-pay | Admitting: Internal Medicine

## 2021-10-20 NOTE — Telephone Encounter (Signed)
Spoke with pt's husband, DPR and was seen at an Urgent Care last night and advised to increase Lasix to '40mg'$ , wear compression and elevate when sitting.  Follow low Na+ diet.  PT with history of hip replacement 06/23, NSTEMI and reduced EF.  Pt's husband states he would like for pt to have general cardiologist in the same office as Dr Caryl Comes.  Appointment scheduled with Gwyndolyn Kaufman, DOD for 10/23/2021 at 1030am. Due to acute edema.  Reviewed ED precautions and  Dr Caryl Comes made aware of follow up.

## 2021-10-23 ENCOUNTER — Encounter: Payer: Self-pay | Admitting: Cardiology

## 2021-10-23 ENCOUNTER — Ambulatory Visit: Payer: Medicare Other | Admitting: Cardiology

## 2021-10-23 VITALS — BP 150/60 | HR 48 | Ht 66.0 in | Wt 158.6 lb

## 2021-10-23 DIAGNOSIS — I10 Essential (primary) hypertension: Secondary | ICD-10-CM | POA: Diagnosis not present

## 2021-10-23 DIAGNOSIS — I428 Other cardiomyopathies: Secondary | ICD-10-CM | POA: Diagnosis not present

## 2021-10-23 DIAGNOSIS — I214 Non-ST elevation (NSTEMI) myocardial infarction: Secondary | ICD-10-CM

## 2021-10-23 DIAGNOSIS — N1831 Chronic kidney disease, stage 3a: Secondary | ICD-10-CM

## 2021-10-23 DIAGNOSIS — E785 Hyperlipidemia, unspecified: Secondary | ICD-10-CM

## 2021-10-23 DIAGNOSIS — I5023 Acute on chronic systolic (congestive) heart failure: Secondary | ICD-10-CM

## 2021-10-23 DIAGNOSIS — E118 Type 2 diabetes mellitus with unspecified complications: Secondary | ICD-10-CM

## 2021-10-23 DIAGNOSIS — I480 Paroxysmal atrial fibrillation: Secondary | ICD-10-CM

## 2021-10-23 DIAGNOSIS — Z8673 Personal history of transient ischemic attack (TIA), and cerebral infarction without residual deficits: Secondary | ICD-10-CM

## 2021-10-23 DIAGNOSIS — I2510 Atherosclerotic heart disease of native coronary artery without angina pectoris: Secondary | ICD-10-CM | POA: Diagnosis not present

## 2021-10-23 DIAGNOSIS — Z79899 Other long term (current) drug therapy: Secondary | ICD-10-CM

## 2021-10-23 LAB — BASIC METABOLIC PANEL
BUN/Creatinine Ratio: 16 (ref 12–28)
BUN: 19 mg/dL (ref 8–27)
CO2: 28 mmol/L (ref 20–29)
Calcium: 9.9 mg/dL (ref 8.7–10.3)
Chloride: 103 mmol/L (ref 96–106)
Creatinine, Ser: 1.2 mg/dL — ABNORMAL HIGH (ref 0.57–1.00)
Glucose: 125 mg/dL — ABNORMAL HIGH (ref 70–99)
Potassium: 3.4 mmol/L — ABNORMAL LOW (ref 3.5–5.2)
Sodium: 145 mmol/L — ABNORMAL HIGH (ref 134–144)
eGFR: 47 mL/min/{1.73_m2} — ABNORMAL LOW (ref >59)

## 2021-10-23 MED ORDER — POTASSIUM CHLORIDE CRYS ER 20 MEQ PO TBCR: EXTENDED_RELEASE_TABLET | ORAL | 0 refills | Status: DC

## 2021-10-23 MED ORDER — FUROSEMIDE 40 MG PO TABS: ORAL_TABLET | ORAL | 0 refills | Status: DC

## 2021-10-23 NOTE — Patient Instructions (Signed)
Medication Instructions:   INCREASE YOUR LASIX TO 40 MG BY MOUTH TWICE DAILY FOR 3 DAYS ONLY, THEN DECREASE TO TAKING 40 MG BY MOUTH DAILY THEREAFTER.  START TAKING POTASSIUM CHLORIDE 20 mEq BY MOUTH TWICE DAILY FOR 3 DAYS ONLY, THEN DECREASE TO TAKING  20 mEq BY MOUTH DAILY THEREAFTER.  THIS IS TAKEN IN CONCURRENCE WITH LASIX ADMINISTRATION  *If you need a refill on your cardiac medications before your next appointment, please call your pharmacy*   Lab Work:  1.)  TODAY--BMET  2.) IN 10 DAYS--BMET  If you have labs (blood work) drawn today and your tests are completely normal, you will receive your results only by: [place] (if you have MyChart) OR A paper copy in the mail If you have any lab test that is abnormal or we need to change your treatment, we will call you to review the results.   Testing/Procedures:  Your physician has requested that you have an echocardiogram. Echocardiography is a painless test that uses sound waves to create images of your heart. It provides your doctor with information about the size and shape of your heart and how well your heart's chambers and valves are working. This procedure takes approximately one hour. There are no restrictions for this procedure. SCHEDULE ECHO TO BE DONE IN ONE MONTH PER DR. Johney Frame    Follow-Up:  3 MONTHS WITH DR. Johney Frame IN THE OFFICE

## 2021-10-23 NOTE — Progress Notes (Signed)
Cardiology Office Note:    Date:  10/23/2021   ID:  Tiffany Wiggins, DOB 10-07-1945, MRN [id number]  PCP:  Raina Mina., MD   [place] [facility] Cardiologist:  Jenne Campus, MD Electrophysiologist:  Virl Axe, MD {   Referring MD: Raina Mina., MD    History of Present Illness:    Tiffany Wiggins is a 76 y.o. female with a hx of paroxysmal Afib, HT, DMII, dementia, CKD IIIA and recent admission for planned left hip arthroplasty with course complicated by NSTEMI with TTE with LVEF 30-35% but clean coronaries on cath who now presents to clinic for urgent visit for LE edema.   Patient admitted in 08/2021 for elective left his surgery. Following her surgery, she had acute chest pain with trop >24,000. Cath with no evidence of obstructive disease. TTE with LVEF 30-35% with lateral and inferior wall motion abnormalites. 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 She was medically managed with toprolol and losartan.  Today, the patient is accompanied by her husband today who provides most of the history. The patient states that she has been having wrosening LE edema since she was discharged from the hospital. She was seen in urgent care and was told to increase the lasix '40mg'$  daily for two days and then back to '20mg'$  daily thereafter. She also started wearing compression socks and began elevating the legs with some improvement. She states there was no significant change in her urine output with the increased dose of lasix. No chest pain, SOB, orthopnea, PND. Rare lightheadedness. No palpitations. No bleeding issues. Has been walking without significant dyspnea on exertion.  Blood pressure is mainly 120-140s at home. HR running 40-50s.   Past Medical History:  Diagnosis Date   Anxiety    Arthritis    CVA (cerebral vascular accident) ([place]) 2019   Diabetes mellitus type 2 in nonobese ([place])    Hyperlipidemia    Hypertension    PCP GAVE FOR HER  BLOOD PRESSURE   IBS (irritable bowel  syndrome)    Memory deficit    PUD (peptic ulcer disease)    TIA (transient ischemic attack) 2006    Past Surgical History:  Procedure Laterality Date   ABDOMINAL HYSTERECTOMY     BIOPSY THYROID  04/2014   COLON SURGERY     LAPAROSCOPY N/A 08/25/2015   Procedure: EXPLORATORY LAPAROTOMY, ILEOCECECTOMY, PRIMARY ANASTOMOSIS, LYSIS OF ADHESIONS;  Surgeon: Autumn Messing III, MD;  Location: [facility];  Service: General;  Laterality: N/A;   laparoscopy for fertility work up     [place] N/A 09/11/2021   Procedure: LEFT HEART CATH AND CORONARY ANGIOGRAPHY;  Surgeon: Nelva Bush, MD;  Location: [place] [facility];  Service: Cardiovascular;  Laterality: N/A;   LOOP RECORDER IMPLANT N/A 03/24/2014   Procedure: LOOP RECORDER IMPLANT;  Surgeon: Deboraha Sprang, MD;  Location: [facility] CATH LAB;  Service: Cardiovascular;  Laterality: N/A;   LUMBAR LAMINECTOMY/DECOMPRESSION MICRODISCECTOMY  04/25/2011   Procedure: LUMBAR LAMINECTOMY/DECOMPRESSION MICRODISCECTOMY;  Surgeon: [name], MD;  Location: [facility] NEURO ORS;  Service: Neurosurgery;  Laterality: Right;  Right Lumbar Four-Five Discectomy   OOPHORECTOMY     TONSILLECTOMY     TOTAL HIP ARTHROPLASTY Left 09/06/2021   Procedure: TOTAL HIP ARTHROPLASTY ANTERIOR APPROACH;  Surgeon: [name], MD;  Location: [facility];  Service: Orthopedics;  Laterality: Left;    Current Medications: Current Meds  Medication Sig   apixaban (ELIQUIS) 5 MG TABS tablet Take 1 tablet (5 mg total)  by mouth 2 (two) times daily.   azelastine (ASTELIN) 0.1 % nasal spray Place 2 sprays into both nostrils as needed for rhinitis or allergies.   Cholecalciferol (VITAMIN D) 125 MCG (5000 UT) CAPS Take 5,000 Units by mouth daily.   Ferrous Sulfate 90 (18 Fe) MG TABS Take 18 mg by mouth daily.   furosemide (LASIX) 40 MG tablet Take 1 tablet (40 mg total) by mouth twice daily for 3 days only, then decrease to taking 1 tablet (40 mg total) by mouth daily  thereafter.   isosorbide mononitrate (IMDUR) 60 MG 24 hr tablet Take 1 tablet (60 mg total) by mouth daily.   losartan (COZAAR) 50 MG tablet Take 1 tablet by mouth once daily   methocarbamol (ROBAXIN) 500 MG tablet Take 1 tablet (500 mg total) by mouth every 6 (six) hours as needed for muscle spasms.   metoprolol succinate (TOPROL-XL) 25 MG 24 hr tablet Take 0.5 tablets (12.5 mg total) by mouth daily.   nitroGLYCERIN (NITROSTAT) 0.4 MG SL tablet Place 1 tablet (0.4 mg total) under the tongue every 5 (five) minutes as needed for chest pain.   potassium chloride SA (KLOR-CON M) 20 MEQ tablet Take 1 tablet (20 mEq total) by mouth twice daily for 3 days only, then decrease to taking 1 tablet (20 mEq total) by mouth daily thereafter.   rosuvastatin (CRESTOR) 20 MG tablet Take 1 tablet (20 mg total) by mouth daily.   traMADol (ULTRAM) 50 MG tablet Take 1 tablet (50 mg total) by mouth every 6 (six) hours as needed for moderate pain.   Zinc 50 MG TABS Take 50 mg by mouth daily.     Allergies:   Codeine and Morphine and related   Social History   Socioeconomic History   Marital status: Married    Spouse name: Not on file   Number of children: 0   Years of education: Not on file   Highest education level: Not on file  Occupational History   Occupation: retired  Tobacco Use   Smoking status: Former    Types: Cigarettes    Quit date: 04/02/2004    Years since quitting: 17.5   Smokeless tobacco: Never  Vaping Use   Vaping Use: Never used  Substance and Sexual Activity   Alcohol use: Yes    Alcohol/week: 2.0 standard drinks of alcohol    Types: 2 Glasses of wine per week   Drug use: No   Sexual activity: Not on file  Other Topics Concern   Not on file  Social History Narrative   Not on file   Social Determinants of Health   Financial Resource Strain: Not on file  Food Insecurity: Not on file  Transportation Needs: Not on file  Physical Activity: Not on file  Stress: Not on file   Social Connections: Not on file     Family History: The patient's family history includes Cancer in her brother and sister; Colon cancer in her paternal uncle; Diabetes in her sister.  ROS:   Please see the history of present illness.     All other systems reviewed and are negative.  EKGs/Labs/Other Studies Reviewed:    The following studies were reviewed today: LHC September 22, 2021: Conclusions: Mild plaquing of LMCA and mid LAD.  No significant atherosclerotic coronary artery disease identified to explain the patient's NSTEMI and cardiomyopathy. Normal left ventricular filling pressure (LVEDP 13 mmHg).   Recommendations: Continue medical therapy for MI with nonobstructive coronary arteries (MINOCA). Escalate goal-directed medical therapy  for nonischemic cardiomyopathy, as tolerated.   Nelva Bush, MD [facility]  TTE 09/09/21: IMPRESSIONS    1. Left ventricular ejection fraction, by estimation, is 30 to 35%. The  left ventricle has moderately decreased function. The left ventricle  demonstrates regional wall motion abnormalities (see scoring  diagram/findings for description). There is mild  concentric left ventricular hypertrophy. Left ventricular diastolic  parameters are indeterminate.   2. Right ventricular systolic function is normal. The right ventricular  size is normal. There is normal pulmonary artery systolic pressure.   3. Left atrial size was mildly dilated.   4. The mitral valve is normal in structure. Trivial mitral valve  regurgitation. No evidence of mitral stenosis.   5. The aortic valve is grossly normal. Aortic valve regurgitation is not  visualized. No aortic stenosis is present.   6. The inferior vena cava is dilated in size with >50% respiratory  variability, suggesting right atrial pressure of 8 mmHg.   Comparison(s): Changes from prior study are noted.   EKG:  EKG is  ordered today.  The ekg ordered today demonstrates sinus bradycardia with HR  48  Recent Labs: 07/06/2021: NT-Pro BNP 338 09/12/2021: BUN 45; Creatinine, Ser 1.71; Hemoglobin 8.6; Platelets 131; Potassium 4.7; Sodium 140  Recent Lipid Panel    Component Value Date/Time   CHOL 159 09/09/2021 0318   TRIG 71 09/09/2021 0318   HDL 81 09/09/2021 0318   CHOLHDL 2.0 09/09/2021 0318   VLDL 14 09/09/2021 0318   LDLCALC 64 09/09/2021 0318   LDLDIRECT 167.0 07/19/2015 1108     Risk Assessment/Calculations:    CHA2DS2-VASc Score = 8  { This indicates a 10.8% annual risk of stroke. The patient's score is based upon: CHF History: 1 HTN History: 1 Diabetes History: 1 Stroke History: 2 Vascular Disease History: 1 Age Score: 1 Gender Score: 1          Physical Exam:    VS:  BP (!) 150/60   Pulse (!) 48   Ht '5\' 6"'$  (1.676 m)   Wt 158 lb 9.6 oz (71.9 kg)   SpO2 96%   BMI 25.60 kg/m     Wt Readings from Last 3 Encounters:  10/23/21 158 lb 9.6 oz (71.9 kg)  09/11/21 160 lb 0.9 oz (72.6 kg)  08/24/21 164 lb (74.4 kg)     GEN:  Well nourished, well developed in no acute distress HEENT: Normal NECK: No JVD; No carotid bruits CARDIAC: RRR, no murmurs, rubs, gallops RESPIRATORY:  Clear to auscultation without rales, wheezing or rhonchi  ABDOMEN: Soft, non-tender, non-distended MUSCULOSKELETAL: 1+ pitting edema to the mid-shin. Warm SKIN: Warm and dry NEUROLOGIC:  Alert and oriented x 3 PSYCHIATRIC:  Normal affect   ASSESSMENT:    1. Acute on chronic systolic heart failure ([place])   2. NICM (nonischemic cardiomyopathy) ([place])   3. Essential hypertension   4. Coronary artery disease involving native coronary artery of native heart without angina pectoris   5. Controlled type 2 diabetes mellitus with complication, without long-term current use of insulin (HCC)   6. Stage 3a chronic kidney disease ([place])   7. NSTEMI (non-ST elevated myocardial infarction) ([place])   8. Medication management   9. Hyperlipidemia, unspecified hyperlipidemia type   10. Paroxysmal  A-fib ([place])   11. History of CVA (cerebrovascular accident)    PLAN:    In order of problems listed above:  #Acute on Chronic Systolic HF: #Nonischemic CM: #Suspected Stress CM: Patient with recent hospitalization for scheduled THA  with course complicated by NSTEMI with trop>24,000. Cath with clean coronaries. TTE with LVEF 30-35% with inferior and lateral WMA. 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 Currently with NYHA class II symptoms. Appears overloaded on exam with 1+ bilateral pitting edema but no orthopnea or PND. Will increase her lasix and monitor response. Plan for repeat TTE in 1 month to reassess LVEF at that time. If persistently low, will adjust GDMT. -Increase lasix to '40mg'$  BID x3 days and then '40mg'$  daily thereafter -Start potassium 63m BID for 2 days and then daily thereafter -Check BMET today and in 10 days after diuresis -Continue metop 12.'5mg'$  XL daily -Continue losartan '25mg'$  daily -Repeat TTE in 1 month to reassess LVEF -If LVEF remains low, will change losartan to entresto and start farxiga  #Paroxysmal Afib: CHADs-vasc 8. Currently in NSR.  -Continue apixaban '5mg'$  BID -Continue metop 12.'5mg'$  XL daily  #HTN: Elevated in the office but mainly 120-130s at home. -Continue metop 12.'5mg'$  XL daily -Continue losartan '25mg'$  daily -Continue amlodipine '5mg'$  daily  #HLD: -Continue crestor '20mg'$  daily  #CKD IIIA: -Monitor renal function with diuresis  #History of CVA: -Continue apixaban and liptor as above        Medication Adjustments/Labs and Tests Ordered: Current medicines are reviewed at length with the patient today.  Concerns regarding medicines are outlined above.  Orders Placed This Encounter  Procedures   Basic metabolic panel   Basic metabolic panel   ECHOCARDIOGRAM COMPLETE   Meds ordered this encounter  Medications   furosemide (LASIX) 40 MG tablet    Sig: Take 1 tablet (40 mg total) by mouth twice daily for 3 days only, then decrease to taking 1 tablet (40 mg  total) by mouth daily thereafter.    Dispense:  36 tablet    Refill:  0    Dose increase   potassium chloride SA (KLOR-CON M) 20 MEQ tablet    Sig: Take 1 tablet (20 mEq total) by mouth twice daily for 3 days only, then decrease to taking 1 tablet (20 mEq total) by mouth daily thereafter.    Dispense:  36 tablet    Refill:  0    In concurrence with lasix administration    Patient Instructions  Medication Instructions:   INCREASE YOUR LASIX TO 40 MG BY MOUTH TWICE DAILY FOR 3 DAYS ONLY, THEN DECREASE TO TAKING 40 MG BY MOUTH DAILY THEREAFTER.  START TAKING POTASSIUM CHLORIDE 20 mEq BY MOUTH TWICE DAILY FOR 3 DAYS ONLY, THEN DECREASE TO TAKING  20 mEq BY MOUTH DAILY THEREAFTER.  THIS IS TAKEN IN CONCURRENCE WITH LASIX ADMINISTRATION  *If you need a refill on your cardiac medications before your next appointment, please call your pharmacy*   Lab Work:  1.)  TODAY--BMET  2.) IN 10 DAYS--BMET  If you have labs (blood work) drawn today and your tests are completely normal, you will receive your results only by: [place](if you have MyChart) OR A paper copy in the mail If you have any lab test that is abnormal or we need to change your treatment, we will call you to review the results.   Testing/Procedures:  Your physician has requested that you have an echocardiogram. Echocardiography is a painless test that uses sound waves to create images of your heart. It provides your doctor with information about the size and shape of your heart and how well your heart's chambers and valves are working. This procedure takes approximately one hour. There are no restrictions for this procedure. SCHEDULE ECHO TO  BE DONE IN ONE MONTH PER DR. Johney Frame    Follow-Up:  3 MONTHS WITH DR. Johney Frame IN THE OFFICE            Signed, Freada Bergeron, MD  10/23/2021 12:58 PM    [place]

## 2021-10-24 ENCOUNTER — Telehealth: Payer: Self-pay | Admitting: *Deleted

## 2021-10-24 NOTE — Telephone Encounter (Signed)
The patient and husband  have been notified of the result and verbalized understanding.  All questions (if any) were answered.  Both parties aware that pt should take for today only, KDUR 40 mEq this AM and 20 mEq this PM, then finish out regular dosing of KDUR as advised for her to do at yesterday's office visit, thereafter.  Both verbalized understanding and agrees with this plan.

## 2021-10-24 NOTE — Addendum Note (Signed)
Addended by: Janan Halter F on: 10/24/2021 05:09 PM   Modules accepted: Orders

## 2021-10-24 NOTE — Telephone Encounter (Signed)
-----   Message from Freada Bergeron, MD sent at 10/23/2021  7:51 PM EDT ----- Her labs show she is low on potassium. We gave her repletion but can we do just one day of 71mq in the AM and 288m in the PM and then change back to the scheduled 206mBID for 3 days and then 28m71maily thereafter. Otherwise, the rest of her labs look good.

## 2021-10-25 ENCOUNTER — Ambulatory Visit: Payer: Medicare Other | Admitting: Internal Medicine

## 2021-10-25 ENCOUNTER — Telehealth: Payer: Self-pay | Admitting: Cardiology

## 2021-10-25 DIAGNOSIS — Z79899 Other long term (current) drug therapy: Secondary | ICD-10-CM

## 2021-10-25 DIAGNOSIS — I10 Essential (primary) hypertension: Secondary | ICD-10-CM

## 2021-10-25 NOTE — Telephone Encounter (Signed)
New Message:    Patient wants to know since she lives in [place], can she have her lab work in the [place] office please?

## 2021-10-25 NOTE — Telephone Encounter (Signed)
Husband called to request to have labs drawn in [place]. Order changed for [employer] location.

## 2021-10-25 NOTE — Addendum Note (Signed)
Addended by: Vergia Alcon A on: 10/25/2021 04:34 PM   Modules accepted: Orders

## 2021-11-02 ENCOUNTER — Other Ambulatory Visit: Payer: Medicare Other

## 2021-11-03 ENCOUNTER — Encounter: Payer: Self-pay | Admitting: Cardiology

## 2021-11-03 DIAGNOSIS — I5023 Acute on chronic systolic (congestive) heart failure: Secondary | ICD-10-CM

## 2021-11-03 DIAGNOSIS — N1831 Chronic kidney disease, stage 3a: Secondary | ICD-10-CM

## 2021-11-03 DIAGNOSIS — I10 Essential (primary) hypertension: Secondary | ICD-10-CM

## 2021-11-08 ENCOUNTER — Encounter: Payer: Self-pay | Admitting: Cardiology

## 2021-11-08 NOTE — Telephone Encounter (Signed)
Error

## 2021-11-10 NOTE — Telephone Encounter (Signed)
Patient's husband states the patient is at [facility] in [place] and they do not have the BMET order. He is requesting to have it re-faxed to [phone]. Please return patient's call to confirm.

## 2021-11-10 NOTE — Addendum Note (Signed)
Addended by: Aris Georgia, Ellina Sivertsen L on: 11/10/2021 04:24 PM   Modules accepted: Orders

## 2021-11-10 NOTE — Telephone Encounter (Addendum)
Reordered lab work. Faxed to number provided.

## 2021-11-21 ENCOUNTER — Ambulatory Visit (INDEPENDENT_AMBULATORY_CARE_PROVIDER_SITE_OTHER): Payer: Medicare Other

## 2021-11-21 DIAGNOSIS — I428 Other cardiomyopathies: Secondary | ICD-10-CM | POA: Diagnosis not present

## 2021-11-22 LAB — ECHOCARDIOGRAM COMPLETE
Area-P 1/2: 1.66 cm2
Calc EF: 59.5 %
S' Lateral: 3.4 cm
Single Plane A2C EF: 61.6 %
Single Plane A4C EF: 56.8 %

## 2021-11-22 LAB — BASIC METABOLIC PANEL
BUN/Creatinine Ratio: 18 (ref 12–28)
BUN: 28 mg/dL — ABNORMAL HIGH (ref 8–27)
CO2: 26 mmol/L (ref 20–29)
Calcium: 9.9 mg/dL (ref 8.7–10.3)
Chloride: 100 mmol/L (ref 96–106)
Creatinine, Ser: 1.55 mg/dL — ABNORMAL HIGH (ref 0.57–1.00)
Glucose: 109 mg/dL — ABNORMAL HIGH (ref 70–99)
Potassium: 4.3 mmol/L (ref 3.5–5.2)
Sodium: 143 mmol/L (ref 134–144)
eGFR: 35 mL/min/{1.73_m2} — ABNORMAL LOW (ref >59)

## 2021-11-27 ENCOUNTER — Other Ambulatory Visit: Payer: Medicare Other

## 2021-12-05 ENCOUNTER — Other Ambulatory Visit: Payer: Self-pay

## 2021-12-05 DIAGNOSIS — I214 Non-ST elevation (NSTEMI) myocardial infarction: Secondary | ICD-10-CM

## 2021-12-05 DIAGNOSIS — I2510 Atherosclerotic heart disease of native coronary artery without angina pectoris: Secondary | ICD-10-CM

## 2021-12-05 DIAGNOSIS — I10 Essential (primary) hypertension: Secondary | ICD-10-CM

## 2021-12-05 DIAGNOSIS — N1831 Chronic kidney disease, stage 3a: Secondary | ICD-10-CM

## 2021-12-05 DIAGNOSIS — I428 Other cardiomyopathies: Secondary | ICD-10-CM

## 2021-12-05 DIAGNOSIS — Z79899 Other long term (current) drug therapy: Secondary | ICD-10-CM

## 2021-12-05 DIAGNOSIS — E118 Type 2 diabetes mellitus with unspecified complications: Secondary | ICD-10-CM

## 2021-12-05 MED ORDER — FUROSEMIDE 40 MG PO TABS: 40.0000 mg | ORAL_TABLET | Freq: Every day | ORAL | 3 refills | Status: DC

## 2022-01-11 ENCOUNTER — Encounter: Payer: Self-pay | Admitting: Internal Medicine

## 2022-01-11 NOTE — Telephone Encounter (Signed)
She should use the propranolol as the "emergency "because the time to effectiveness of metoprolol is going to be much longer

## 2022-01-17 ENCOUNTER — Ambulatory Visit: Payer: Medicare Other | Admitting: Cardiology

## 2022-02-08 ENCOUNTER — Ambulatory Visit: Payer: Medicare Other | Admitting: Cardiology

## 2022-02-12 ENCOUNTER — Other Ambulatory Visit: Payer: Self-pay | Admitting: Internal Medicine

## 2022-02-12 DIAGNOSIS — Z1231 Encounter for screening mammogram for malignant neoplasm of breast: Secondary | ICD-10-CM

## 2022-04-09 ENCOUNTER — Other Ambulatory Visit: Payer: Self-pay | Admitting: Student

## 2022-04-12 ENCOUNTER — Ambulatory Visit: Payer: Medicare Other

## 2022-04-19 ENCOUNTER — Ambulatory Visit: Payer: Medicare Other | Admitting: Internal Medicine

## 2022-05-10 ENCOUNTER — Encounter (HOSPITAL_COMMUNITY): Payer: Self-pay | Admitting: *Deleted

## 2022-05-21 ENCOUNTER — Ambulatory Visit: Payer: Medicare Other | Admitting: Internal Medicine

## 2022-05-21 DIAGNOSIS — R001 Bradycardia, unspecified: Secondary | ICD-10-CM

## 2022-05-21 DIAGNOSIS — I639 Cerebral infarction, unspecified: Secondary | ICD-10-CM

## 2022-05-21 DIAGNOSIS — I480 Paroxysmal atrial fibrillation: Secondary | ICD-10-CM

## 2022-05-21 DIAGNOSIS — Z9889 Other specified postprocedural states: Secondary | ICD-10-CM

## 2022-05-24 ENCOUNTER — Ambulatory Visit: Payer: Medicare Other

## 2022-07-03 ENCOUNTER — Ambulatory Visit: Payer: Medicare Other | Admitting: Internal Medicine

## 2022-07-03 DIAGNOSIS — I480 Paroxysmal atrial fibrillation: Secondary | ICD-10-CM

## 2022-07-03 DIAGNOSIS — I639 Cerebral infarction, unspecified: Secondary | ICD-10-CM

## 2022-07-03 DIAGNOSIS — R001 Bradycardia, unspecified: Secondary | ICD-10-CM

## 2022-07-08 ENCOUNTER — Other Ambulatory Visit: Payer: Self-pay | Admitting: Student

## 2022-07-09 ENCOUNTER — Other Ambulatory Visit: Payer: Self-pay | Admitting: Physician Assistant

## 2022-07-09 DIAGNOSIS — M47816 Spondylosis without myelopathy or radiculopathy, lumbar region: Secondary | ICD-10-CM

## 2022-07-09 DIAGNOSIS — M47814 Spondylosis without myelopathy or radiculopathy, thoracic region: Secondary | ICD-10-CM

## 2022-07-16 ENCOUNTER — Ambulatory Visit: Payer: Medicare Other | Admitting: Internal Medicine

## 2022-07-18 ENCOUNTER — Ambulatory Visit
Admission: RE | Admit: 2022-07-18 | Discharge: 2022-07-18 | Disposition: A | Discharge status: Home / Self Care | Payer: Medicare Other | Source: Ambulatory Visit | Attending: Internal Medicine | Admitting: Internal Medicine

## 2022-07-18 DIAGNOSIS — Z1231 Encounter for screening mammogram for malignant neoplasm of breast: Secondary | ICD-10-CM

## 2022-07-31 ENCOUNTER — Ambulatory Visit
Admission: RE | Admit: 2022-07-31 | Discharge: 2022-07-31 | Disposition: A | Discharge status: Home / Self Care | Payer: Medicare Other | Source: Ambulatory Visit | Attending: Physician Assistant | Admitting: Physician Assistant

## 2022-07-31 DIAGNOSIS — M47814 Spondylosis without myelopathy or radiculopathy, thoracic region: Secondary | ICD-10-CM

## 2022-07-31 DIAGNOSIS — M47816 Spondylosis without myelopathy or radiculopathy, lumbar region: Secondary | ICD-10-CM

## 2022-09-20 ENCOUNTER — Ambulatory Visit: Payer: Medicare Other | Admitting: Internal Medicine

## 2022-09-20 ENCOUNTER — Ambulatory Visit: Payer: Medicare Other | Attending: Internal Medicine | Admitting: Internal Medicine

## 2022-09-20 ENCOUNTER — Encounter: Payer: Self-pay | Admitting: Internal Medicine

## 2022-09-20 VITALS — BP 130/78 | HR 48 | Ht 66.0 in | Wt 156.2 lb

## 2022-09-20 DIAGNOSIS — I428 Other cardiomyopathies: Secondary | ICD-10-CM | POA: Diagnosis not present

## 2022-09-20 NOTE — Progress Notes (Signed)
Patient Care Team: Gordan Payment., MD as PCP - General (Internal Medicine) Duke Salvia, MD as PCP - Electrophysiology (Cardiology) Georgeanna Lea, MD as PCP - Cardiology (Cardiology) Griselda Miner, MD as Consulting Physician (General Surgery) Napoleon Form, MD as Consulting Physician (Gastroenterology)   HPI  Tiffany Wiggins is a 77 y.o. female Seen in follow-up for history of palpitations with cryptogenic stroke-- status post loop recorder insertion\  10/18 atrial fibrillation was identified on her LINQ>>Rx  apixaban   9/20 ended up in the hospital because of atrial fibrillation heart rate 115 associated with chest pain.  EMS called.  Had reverted to sinus rhythm by the time of their arrival.  Was admitted with borderline troponins.  Underwent CTA.  FFR 0.8 in the proximal vessel 0.6 in the distal LAD.  Thought likely related to hemodynamic stuttering down the LAD.  Is currently on a statin.  Event monitor showed poor HR excusion and with ongoing DOE and exertional pain, GXT  undertake treadmill test for chronotropic competence >> pk HR 97 just about 70 % predicated max but her hip limitations were also notable   The patient denies chest pain, shortness of breath, nocturnal dyspnea, orthopnea or peripheral edema.  There have been no palpitations, lightheadedness or syncope.  Complains of limitations are hip and back .   Underwent hip replacement surgery and now possibly the second hip  Date Cr K LDL Hgb  9/20 1.07 3.6 211 12.7   7/21 1.21 4.2 123 12.4  11/22 (CE)  1.25 4.7  12.8      DATE TEST EF   2015 Cath    70 %   4/18 Myoview  60 % No ischemia  9/20 CTA  FFR LCx 0.82//RCA 0.88   3./22 Myoview  >65% No perfusion defects  6/23 LHC  LADm mild nonobstructive plaque  8/23 Echo  55-60          Past Medical History:  Diagnosis Date   Anxiety    Arthritis    CVA (cerebral vascular accident) ([facility]) 2019   Diabetes mellitus type 2 in nonobese ([facility])     Hyperlipidemia    Hypertension    PCP GAVE FOR HER  BLOOD PRESSURE   IBS (irritable bowel syndrome)    Memory deficit    PUD (peptic ulcer disease)    TIA (transient ischemic attack) 2006    Past Surgical History:  Procedure Laterality Date   ABDOMINAL HYSTERECTOMY     BIOPSY THYROID  04/2014   COLON SURGERY     LAPAROSCOPY N/A 08/25/2015   Procedure: EXPLORATORY LAPAROTOMY, ILEOCECECTOMY, PRIMARY ANASTOMOSIS, LYSIS OF ADHESIONS;  Surgeon: Chevis Pretty III, MD;  Location: [facility];  Service: General;  Laterality: N/A;   laparoscopy for fertility work up     LEFT HEART CATH AND CORONARY ANGIOGRAPHY N/A 09/11/2021   Procedure: LEFT HEART CATH AND CORONARY ANGIOGRAPHY;  Surgeon: Yvonne Kendall, MD;  Location: [facility] LAB;  Service: Cardiovascular;  Laterality: N/A;   LOOP RECORDER IMPLANT N/A 03/24/2014   Procedure: LOOP RECORDER IMPLANT;  Surgeon: Duke Salvia, MD;  Location: [facility] CATH LAB;  Service: Cardiovascular;  Laterality: N/A;   LUMBAR LAMINECTOMY/DECOMPRESSION MICRODISCECTOMY  04/25/2011   Procedure: LUMBAR LAMINECTOMY/DECOMPRESSION MICRODISCECTOMY;  Surgeon: Karn Cassis, MD;  Location: [facility] NEUR[facility] ORS;  Service: Neurosurgery;  Laterality: Right;  Right Lumbar Four-Five Discectomy   OOPHORECTOMY     TONSILLECTOMY     TOTAL HIP ARTHROPLASTY  Left 09/06/2021   Procedure: TOTAL HIP ARTHROPLASTY ANTERIOR APPROACH;  Surgeon: Ollen Gross, MD;  Location: [facility];  Service: Orthopedics;  Laterality: Left;    Current Outpatient Medications  Medication Sig Dispense Refill   amLODipine (NORVASC) 5 MG tablet Take 1 tablet (5 mg total) by mouth daily. 180 tablet 3   apixaban (ELIQUIS) 5 MG TABS tablet Take 1 tablet (5 mg total) by mouth 2 (two) times daily. 60 tablet 0   azelastine (ASTELIN) 0.1 % nasal spray Place 2 sprays into both nostrils as needed for rhinitis or allergies.     Cholecalciferol (VITAMIN D) 125 MCG (5000 UT) CAPS Take 5,000 Units by mouth daily.     Coenzyme Q10  (COQ10) 100 MG CAPS Take 100 mg by mouth daily.     Ferrous Sulfate 90 (18 Fe) MG TABS Take 18 mg by mouth daily.     furosemide (LASIX) 40 MG tablet Take 1 tablet (40 mg total) by mouth daily. 90 tablet 3   isosorbide mononitrate (IMDUR) 60 MG 24 hr tablet Take 1 tablet (60 mg total) by mouth daily. 90 tablet 0   losartan (COZAAR) 50 MG tablet Take 1 tablet by mouth once daily 90 tablet 3   methocarbamol (ROBAXIN) 500 MG tablet Take 1 tablet (500 mg total) by mouth every 6 (six) hours as needed for muscle spasms. 40 tablet 0   nitroGLYCERIN (NITROSTAT) 0.4 MG SL tablet Place 1 tablet (0.4 mg total) under the tongue every 5 (five) minutes as needed for chest pain. 35 tablet 2   OVER THE COUNTER MEDICATION Take 2 tablets by mouth daily. Memory and Nerve Support     traMADol (ULTRAM) 50 MG tablet Take 1 tablet (50 mg total) by mouth every 6 (six) hours as needed for moderate pain. 30 tablet 0   Zinc 50 MG TABS Take 50 mg by mouth daily.     metoprolol succinate (TOPROL-XL) 25 MG 24 hr tablet Take 0.5 tablets (12.5 mg total) by mouth daily. 15 tablet 2   rosuvastatin (CRESTOR) 20 MG tablet Take 1 tablet (20 mg total) by mouth daily. 30 tablet 2   No current facility-administered medications for this visit.    Allergies  Allergen Reactions   Codeine Nausea Only   Morphine And Codeine Itching      Review of Systems negative except from HPI and PMH  Physical Exam BP 130/78   Pulse (!) 48   Ht 5\' 6"  (1.676 m)   Wt 156 lb 3.2 oz (70.9 kg)   SpO2 97%   BMI 25.21 kg/m  Well developed and nourished in no acute distress HENT normal Neck supple with JVP-  flat   Clear Regular rate and rhythm, no murmurs or gallops Abd-soft with active BS No Clubbing cyanosis edema Skin-warm and dry A & Oriented  Grossly normal sensory and motor function  ECG sinus at 48 Volts 14/08/45     Assessment and  Plan Cryptogenic stroke  Implantable loop recorder at RRT  Hypertension  Sinus  bradycardia  Chest discomfort  Exertional  Atrial fibrillation-recurrent  Emotional Lability memory loss  Asymptomatic bradycardia  BP well controlled  Cardiovascular risk for surgery should be acceptable

## 2022-09-20 NOTE — Patient Instructions (Addendum)
Medication Instructions:  Your physician recommends that you continue on your current medications as directed. Please refer to the Current Medication list given to you today. *If you need a refill on your cardiac medications before your next appointment, please call your pharmacy*   Follow-Up: At [facility], you and your health needs are our priority.  As part of our continuing mission to provide you with exceptional heart care, we have created designated Provider Care Teams.  These Care Teams include your primary Cardiologist (physician) and Advanced Practice Providers (APPs -  Physician Assistants and Nurse Practitioners) who all work together to provide you with the care you need, when you need it.  We recommend signing up for the patient portal called "MyChart".  Sign up information is provided on this After Visit Summary.  MyChart is used to connect with patients for Virtual Visits (Telemedicine).  Patients are able to view lab/test results, encounter notes, upcoming appointments, etc.  Non-urgent messages can be sent to your provider as well.   To learn more about what you can do with MyChart, go to [email or web address].    Your next appointment:   1 year(s)  Provider:   Sherryl Manges, MD

## 2022-10-01 ENCOUNTER — Telehealth: Payer: Self-pay | Admitting: *Deleted

## 2022-10-01 NOTE — Telephone Encounter (Signed)
   Pre-operative Risk Assessment    Patient Name: Tiffany Wiggins  DOB: [birth year] MRN: [id number]      Request for Surgical Clearance    Procedure:   RIGHT TOTAL HIP ARTHROPLASTY  Date of Surgery:  Clearance 11/05/22                                 Surgeon:  DR. Ollen Gross Surgeon's Group or Practice Name:  [facility] Phone number:  [phone] ATTN: Aida Raider Fax number:  [phone]   Type of Clearance Requested:   - Medical  - Pharmacy:  Hold Apixaban (Eliquis)     Type of Anesthesia:   CHOICE   Additional requests/questions:    [name]   10/01/2022, 4:08 PM

## 2022-10-01 NOTE — Telephone Encounter (Signed)
Pharmacy please advise on holding Eliquis prior to right hip arthroplasty scheduled for 11/05/2022. Thank you.

## 2022-10-02 NOTE — Telephone Encounter (Signed)
Patient with diagnosis of afib on Eliquis for anticoagulation.    Procedure: right hip arthroplasty Date of procedure: 11/05/22  CHA2DS2-VASc Score = 9  This indicates a 12.2% annual risk of stroke. The patient's score is based upon: CHF History: 1 HTN History: 1 Diabetes History: 1 Stroke History: 2 Vascular Disease History: 1 Age Score: 2 Gender Score: 1   CrCl 57mL/min Platelet count 173K  Pt is at elevated CV risk off of anticoagulation, however has renal dysfunction and memory loss so periprocedural bridging isn't ideal either. Recommend patient hold Eliquis for 3 days prior to procedure and resume as soon as safely possible after given elevated CV Risk - will forward to MD to confirm this is acceptable.  **This guidance is not considered finalized until pre-operative APP has relayed final recommendations.**

## 2022-10-02 NOTE — Telephone Encounter (Signed)
   Patient Name: Tiffany Wiggins  DOB: 1945/09/13 MRN: [id number]  Primary Cardiologist: Gypsy Balsam, MD  Chart reviewed as part of pre-operative protocol coverage. Given past medical history and time since last visit, based on ACC/AHA guidelines, Tiffany Wiggins is at acceptable risk for the planned procedure without further cardiovascular testing.   Pt is at elevated CV risk off of anticoagulation, however has renal dysfunction and memory loss so periprocedural bridging isn't ideal either.    Recommend patient hold Eliquis for 3 days prior to procedure and resume as soon as safely possible after given elevated CV Risk    The patient was advised that if she develops new symptoms prior to surgery to contact our office to arrange for a follow-up visit, and she verbalized understanding.  I will route this recommendation to the requesting party via Epic fax function and remove from pre-op pool.  Please call with questions.  Napoleon Form, Leodis Rains, NP 10/02/2022, 10:42 AM

## 2022-10-08 ENCOUNTER — Other Ambulatory Visit: Payer: Self-pay

## 2022-10-08 MED ORDER — ISOSORBIDE MONONITRATE ER 60 MG PO TB24: 60.0000 mg | ORAL_TABLET | Freq: Every day | ORAL | 0 refills | Status: DC

## 2022-10-23 NOTE — Patient Instructions (Signed)
SURGICAL WAITING ROOM VISITATION Patients having surgery or a procedure may have no more than 2 support people in the waiting area - these visitors may rotate in the visitor waiting room.   Due to an increase in RSV and influenza rates and associated hospitalizations, children ages 34 and under may not visit patients in [facility] hospitals. If the patient needs to stay at the hospital during part of their recovery, the visitor guidelines for inpatient rooms apply.  PRE-OP VISITATION  Pre-op nurse will coordinate an appropriate time for 1 support person to accompany the patient in pre-op.  This support person may not rotate.  This visitor will be contacted when the time is appropriate for the visitor to come back in the pre-op area.  Please refer to the [facility] website for the visitor guidelines for Inpatients (after your surgery is over and you are in a regular room).  You are not required to quarantine at this time prior to your surgery. However, you must do this: Hand Hygiene often Do NOT share personal items Notify your provider if you are in close contact with someone who has COVID or you develop fever 100.4 or greater, new onset of sneezing, cough, sore throat, shortness of breath or body aches.  If you test positive for Covid or have been in contact with anyone that has tested positive in the last 10 days please notify you surgeon.    Your procedure is scheduled on:  Monday  November 05, 2022  Report to [facility] Main Entrance: [place] entrance where the [employer] is available.   Report to admitting at:   11:30   AM  Call this number if you have any questions or problems the morning of surgery [phone]  Do not eat food after Midnight the night prior to your surgery/procedure.  After Midnight you may have the following liquids until  11;00 AM DAY OF SURGERY  Clear Liquid Diet Water Black Coffee (sugar ok, NO MILK/CREAM OR CREAMERS)  Tea (sugar ok, NO  MILK/CREAM OR CREAMERS) regular and decaf                             Plain Jell-O  with no fruit (NO RED)                                           Fruit ices (not with fruit pulp, NO RED)                                     Popsicles (NO RED)                                                                  Juice: NO CITRUS JUICES: only apple, WHITE grape, WHITE cranberry Sports drinks like Gatorade or Powerade (NO RED)                     The day of surgery:  Drink ONE (1) Pre-Surgery  G2 at  11:00  AM the  morning of surgery. Drink in one sitting. Do not sip.  This drink was given to you during your hospital pre-op appointment visit. Nothing else to drink after completing the Pre-Surgery  G2 : No candy, chewing gum or throat lozenges.    FOLLOW ANY ADDITIONAL PRE OP INSTRUCTIONS YOU RECEIVED FROM YOUR SURGEON'S OFFICE!!!   Oral Hygiene is also important to reduce your risk of infection.        Remember - BRUSH YOUR TEETH THE MORNING OF SURGERY WITH YOUR REGULAR TOOTHPASTE  Do NOT smoke after Midnight the night before surgery.  Take ONLY these medicines the morning of surgery with A SIP OF WATER: amlodipine, propranolol (Inderal), Isosorbide (Imdur)   You may not have any metal on your body including hair pins, jewelry, and body piercing  Do not wear make-up, lotions, powders, perfumes  or deodorant  Do not wear nail polish including gel and S&S, artificial / acrylic nails, or any other type of covering on natural nails including finger and toenails. If you have artificial nails, gel coating, etc., that needs to be removed by a nail salon, Please have this removed prior to surgery. Not doing so may mean that your surgery could be cancelled or delayed if the Surgeon or anesthesia staff feels like they are unable to monitor you safely.   Do not shave 48 hours prior to surgery to avoid nicks in your skin which may contribute to postoperative infections.    Contacts, Hearing Aids,  dentures or bridgework may not be worn into surgery. DENTURES WILL BE REMOVED PRIOR TO SURGERY PLEASE DO NOT APPLY "Poly grip" OR ADHESIVES!!!  You may bring a small overnight bag with you on the day of surgery, only pack items that are not valuable. [facility] IS NOT RESPONSIBLE   FOR VALUABLES THAT ARE LOST OR STOLEN.   Do not bring your home medications to the hospital. The Pharmacy will dispense medications listed on your medication list to you during your admission in the Hospital.  Special Instructions: Bring a copy of your healthcare power of attorney and living will documents the day of surgery, if you wish to have them scanned into your [facility]- EPIC  Please read over the following fact sheets you were given: IF YOU HAVE QUESTIONS ABOUT YOUR PRE-OP INSTRUCTIONS, PLEASE CALL [phone].     Pre-operative 5 CHG Bath Instructions   You can play a key role in reducing the risk of infection after surgery. Your skin needs to be as free of germs as possible. You can reduce the number of germs on your skin by washing with CHG (chlorhexidine gluconate) soap before surgery. CHG is an antiseptic soap that kills germs and continues to kill germs even after washing.   DO NOT use if you have an allergy to chlorhexidine/CHG or antibacterial soaps. If your skin becomes reddened or irritated, stop using the CHG and notify one of our RNs at [phone]  Please shower with the CHG soap starting 4 days before surgery using the following schedule: START SHOWERS ON THURSDAY  November 01, 2022  Please keep in mind the following:  DO NOT shave, including legs and underarms, starting the day of your first shower.   You may shave your face at any point before/day of surgery.   Place clean sheets on your bed  the day you start using CHG soap. Use a clean washcloth (not used since being washed) for each shower. DO NOT sleep with pets once you start using the CHG.   CHG Shower Instructions:  If you choose to wash your hair and private area, wash first with your normal shampoo/soap.  After you use shampoo/soap, rinse your hair and body thoroughly to remove shampoo/soap residue.  Turn the water OFF and apply about 3 tablespoons (45 ml) of CHG soap to a CLEAN washcloth.  Apply CHG soap ONLY FROM YOUR NECK DOWN TO YOUR TOES (washing for 3-5 minutes)  DO NOT use CHG soap on face, private areas, open wounds, or sores.  Pay special attention to the area where your surgery is being performed.  If you are having back surgery, having someone wash your back for you may be helpful.  Wait 2 minutes after CHG soap is applied, then you may rinse off the CHG soap.  Pat dry with a clean towel  Put on clean clothes/pajamas   If you choose to wear lotion, please use ONLY the CHG-compatible lotions on the back of this paper.     Additional instructions for the day of surgery: DO NOT APPLY any lotions, deodorants, cologne, or perfumes.   Put on clean/comfortable clothes.  Brush your teeth.  Ask your nurse before applying any prescription medications to the skin.      CHG Compatible Lotions   Aveeno Moisturizing lotion  Cetaphil Moisturizing Cream  Cetaphil Moisturizing Lotion  Clairol Herbal Essence Moisturizing Lotion, Dry Skin  Clairol Herbal Essence Moisturizing Lotion, Extra Dry Skin  Clairol Herbal Essence Moisturizing Lotion, Normal Skin  Curel Age Defying Therapeutic Moisturizing Lotion with Alpha Hydroxy  Curel Extreme Care Body Lotion  Curel Soothing Hands Moisturizing Hand Lotion  Curel Therapeutic Moisturizing Cream, Fragrance-Free  Curel Therapeutic Moisturizing Lotion, Fragrance-Free  Curel Therapeutic Moisturizing Lotion, Original Formula  Eucerin Daily Replenishing Lotion  Eucerin Dry  Skin Therapy Plus Alpha Hydroxy Crme  Eucerin Dry Skin Therapy Plus Alpha Hydroxy Lotion  Eucerin Original Crme  Eucerin Original Lotion  Eucerin Plus Crme Eucerin Plus Lotion  Eucerin TriLipid Replenishing Lotion  Keri Anti-Bacterial Hand Lotion  Keri Deep Conditioning Original Lotion Dry Skin Formula Softly Scented  Keri Deep Conditioning Original Lotion, Fragrance Free Sensitive Skin Formula  Keri Lotion Fast Absorbing Fragrance Free Sensitive Skin Formula  Keri Lotion Fast Absorbing Softly Scented Dry Skin Formula  Keri Original Lotion  Keri Skin Renewal Lotion Keri Silky Smooth Lotion  Keri Silky Smooth Sensitive Skin Lotion  Nivea Body Creamy Conditioning Oil  Nivea Body Extra Enriched Lotion  Nivea Body Original Lotion  Nivea Body Sheer Moisturizing Lotion Nivea Crme  Nivea Skin Firming Lotion  NutraDerm 30 Skin Lotion  NutraDerm Skin Lotion  NutraDerm Therapeutic Skin Cream  NutraDerm Therapeutic Skin Lotion  ProShield Protective Hand Cream  Provon moisturizing lotion   FAILURE TO FOLLOW THESE INSTRUCTIONS MAY RESULT IN THE CANCELLATION OF YOUR SURGERY  PATIENT SIGNATURE_________________________________  NURSE SIGNATURE__________________________________  ________________________________________________________________________       Tiffany Wiggins    An incentive spirometer is a tool that can help keep your lungs clear and active. This tool measures how well you are filling your lungs with each breath. Taking  long deep breaths may help reverse or decrease the chance of developing breathing (pulmonary) problems (especially infection) following: A long period of time when you are unable to move or be active. BEFORE THE PROCEDURE  If the spirometer includes an indicator to show your best effort, your nurse or respiratory therapist will set it to a desired goal. If possible, sit up straight or lean slightly forward. Try not to slouch. Hold the incentive  spirometer in an upright position. INSTRUCTIONS FOR USE  Sit on the edge of your bed if possible, or sit up as far as you can in bed or on a chair. Hold the incentive spirometer in an upright position. Breathe out normally. Place the mouthpiece in your mouth and seal your lips tightly around it. Breathe in slowly and as deeply as possible, raising the piston or the ball toward the top of the column. Hold your breath for 3-5 seconds or for as long as possible. Allow the piston or ball to fall to the bottom of the column. Remove the mouthpiece from your mouth and breathe out normally. Rest for a few seconds and repeat Steps 1 through 7 at least 10 times every 1-2 hours when you are awake. Take your time and take a few normal breaths between deep breaths. The spirometer may include an indicator to show your best effort. Use the indicator as a goal to work toward during each repetition. After each set of 10 deep breaths, practice coughing to be sure your lungs are clear. If you have an incision (the cut made at the time of surgery), support your incision when coughing by placing a pillow or rolled up towels firmly against it. Once you are able to get out of bed, walk around indoors and cough well. You may stop using the incentive spirometer when instructed by your caregiver.  RISKS AND COMPLICATIONS Take your time so you do not get dizzy or light-headed. If you are in pain, you may need to take or ask for pain medication before doing incentive spirometry. It is harder to take a deep breath if you are having pain. AFTER USE Rest and breathe slowly and easily. It can be helpful to keep track of a log of your progress. Your caregiver can provide you with a simple table to help with this. If you are using the spirometer at home, follow these instructions: SEEK MEDICAL CARE IF:  You are having difficultly using the spirometer. You have trouble using the spirometer as often as instructed. Your pain  medication is not giving enough relief while using the spirometer. You develop fever of 100.5 F (38.1 C) or higher.                                                                                                    SEEK IMMEDIATE MEDICAL CARE IF:  You cough up bloody sputum that had not been present before. You develop fever of 102 F (38.9 C) or greater. You develop worsening pain at or near the incision site. MAKE SURE YOU:  Understand these instructions. Will watch your condition.  Will get help right away if you are not doing well or get worse. Document Released: 07/30/2006 Document Revised: 06/11/2011 Document Reviewed: 09/30/2006 [facility] Patient Information 2014 [place], [place].     WHAT IS A BLOOD TRANSFUSION? Blood Transfusion Information  A transfusion is the replacement of blood or some of its parts. Blood is made up of multiple cells which provide different functions. Red blood cells carry oxygen and are used for blood loss replacement. White blood cells fight against infection. Platelets control bleeding. Plasma helps clot blood. Other blood products are available for specialized needs, such as hemophilia or other clotting disorders. BEFORE THE TRANSFUSION  Who gives blood for transfusions?  Healthy volunteers who are fully evaluated to make sure their blood is safe. This is blood bank blood. Transfusion therapy is the safest it has ever been in the practice of medicine. Before blood is taken from a donor, a complete history is taken to make sure that person has no history of diseases nor engages in risky social behavior (examples are intravenous drug use or sexual activity with multiple partners). The donor's travel history is screened to minimize risk of transmitting infections, such as malaria. The donated blood is tested for signs of infectious diseases, such as HIV and hepatitis. The blood is then tested to be sure it is compatible with you in order to minimize the  chance of a transfusion reaction. If you or a relative donates blood, this is often done in anticipation of surgery and is not appropriate for emergency situations. It takes many days to process the donated blood. RISKS AND COMPLICATIONS Although transfusion therapy is very safe and saves many lives, the main dangers of transfusion include:  Getting an infectious disease. Developing a transfusion reaction. This is an allergic reaction to something in the blood you were given. Every precaution is taken to prevent this. The decision to have a blood transfusion has been considered carefully by your caregiver before blood is given. Blood is not given unless the benefits outweigh the risks. AFTER THE TRANSFUSION Right after receiving a blood transfusion, you will usually feel much better and more energetic. This is especially true if your red blood cells have gotten low (anemic). The transfusion raises the level of the red blood cells which carry oxygen, and this usually causes an energy increase. The nurse administering the transfusion will monitor you carefully for complications. HOME CARE INSTRUCTIONS  No special instructions are needed after a transfusion. You may find your energy is better. Speak with your caregiver about any limitations on activity for underlying diseases you may have. SEEK MEDICAL CARE IF:  Your condition is not improving after your transfusion. You develop redness or irritation at the intravenous (IV) site. SEEK IMMEDIATE MEDICAL CARE IF:  Any of the following symptoms occur over the next 12 hours: Shaking chills. You have a temperature by mouth above 102 F (38.9 C), not controlled by medicine. Chest, back, or muscle pain. People around you feel you are not acting correctly or are confused. Shortness of breath or difficulty breathing. Dizziness and fainting. You get a rash or develop hives. You have a decrease in urine output. Your urine turns a dark color or changes to  pink, red, or brown. Any of the following symptoms occur over the next 10 days: You have a temperature by mouth above 102 F (38.9 C), not controlled by medicine. Shortness of breath. Weakness after normal activity. The white part of the eye turns yellow (jaundice). You have a decrease  in the amount of urine or are urinating less often. Your urine turns a dark color or changes to pink, red, or brown. Document Released: 03/16/2000 Document Revised: 06/11/2011 Document Reviewed: 11/03/2007 [facility] Patient Information 2014 [place], [place].  _______________________________________________________________________

## 2022-10-23 NOTE — Progress Notes (Signed)
COVID Vaccine received:  []  No [x]  Yes Date of any COVID positive Test in last 90 days:  None  PCP - Feliciana Rossetti, MD [facility] [phone] Primary care [place] [phone]  Fax [phone] Cardiologist - Laurance Flatten, MD    Robin Searing NP 10-02-2022 note cardiac clearance EP- Sherryl Manges, MD  Chest x-ray - 09-09-2021  1v  Epic EKG -  09-20-2022 Epic Stress Test -  ECHO - 11-22-2021  Epic Cardiac Cath - 09-11-2021  LHC by Dr End  PCR screen: [x]  Ordered & Completed           []   No Order but Needs PROFEND           []   N/A for this surgery  Surgery Plan:  []  Ambulatory                            [x]  Outpatient in bed                            []  Admit  Anesthesia:    []  General  []  Spinal                           [x]   Choice []   MAC  Pacemaker / ICD device [x]  No []  Yes   Spinal Cord Stimulator:[x]  No []  Yes       History of Sleep Apnea? [x]  No []  Yes   CPAP used?- [x]  No []  Yes    Does the patient monitor blood sugar?          [x]  No []  Yes  []  N/A Last A1c was 6.3 on 08-15-2022  Epic Patient has: []  NO Hx DM   [x]  Pre-DM                 []  DM1  []   DM2  Blood Thinner / Instructions:  Eliquis  Hold x 3 days,  Last dose on 11-01-22   okay per Robin Searing NP Aspirin Instructions:  none  ERAS Protocol Ordered: []  No  [x]  Yes Patient is to be NPO after: 11:00 am  Comments: Patient was given the 5 CHG shower / bath instructions for THA surgery along with 2 bottles of the CHG soap. Patient will start this on: Thursday 11-01-2022  All questions were asked and answered, Patient voiced understanding of this process.   Activity level: Patient is able to climb a flight of stairs without difficulty; [x]  No CP   but would have SOB, leg pain._   Patient can / can not perform ADLs without assistance.   Anesthesia review: CAD-NSTEMI, A. Fib, HTn, Hx CVA, Hx Loop recorder EOL in 2019 removed, GERD, CKD3a, CHF, Alzheimer's dementia- memory loss (Husband must be with her, He's the POA),  Pre-DM  Patient denies shortness of breath, fever, cough and chest pain at PAT appointment.  Patient verbalized understanding and agreement to the Pre-Surgical Instructions that were given to them at this PAT appointment. Patient was also educated of the need to review these PAT instructions again prior to her surgery.I reviewed the appropriate phone numbers to call if they have any and questions or concerns.

## 2022-10-24 ENCOUNTER — Encounter (HOSPITAL_COMMUNITY)
Admission: RE | Admit: 2022-10-24 | Discharge: 2022-10-24 | Disposition: A | Discharge status: Home / Self Care | Payer: Medicare Other | Source: Ambulatory Visit | Attending: Orthopedic Surgery | Admitting: Orthopedic Surgery

## 2022-10-24 ENCOUNTER — Other Ambulatory Visit: Payer: Self-pay

## 2022-10-24 ENCOUNTER — Encounter (HOSPITAL_COMMUNITY): Payer: Self-pay

## 2022-10-24 VITALS — BP 125/55 | HR 52 | Temp 97.7°F | Resp 16 | Ht 66.0 in | Wt 153.0 lb

## 2022-10-24 DIAGNOSIS — R001 Bradycardia, unspecified: Secondary | ICD-10-CM | POA: Insufficient documentation

## 2022-10-24 DIAGNOSIS — I10 Essential (primary) hypertension: Secondary | ICD-10-CM | POA: Insufficient documentation

## 2022-10-24 DIAGNOSIS — R413 Other amnesia: Secondary | ICD-10-CM | POA: Diagnosis not present

## 2022-10-24 DIAGNOSIS — Z87891 Personal history of nicotine dependence: Secondary | ICD-10-CM | POA: Diagnosis not present

## 2022-10-24 DIAGNOSIS — I2510 Atherosclerotic heart disease of native coronary artery without angina pectoris: Secondary | ICD-10-CM | POA: Diagnosis not present

## 2022-10-24 DIAGNOSIS — Z01812 Encounter for preprocedural laboratory examination: Secondary | ICD-10-CM | POA: Diagnosis present

## 2022-10-24 DIAGNOSIS — Z7901 Long term (current) use of anticoagulants: Secondary | ICD-10-CM | POA: Insufficient documentation

## 2022-10-24 DIAGNOSIS — Z01818 Encounter for other preprocedural examination: Secondary | ICD-10-CM

## 2022-10-24 DIAGNOSIS — M1611 Unilateral primary osteoarthritis, right hip: Secondary | ICD-10-CM | POA: Diagnosis not present

## 2022-10-24 DIAGNOSIS — Z8673 Personal history of transient ischemic attack (TIA), and cerebral infarction without residual deficits: Secondary | ICD-10-CM | POA: Insufficient documentation

## 2022-10-24 DIAGNOSIS — I6529 Occlusion and stenosis of unspecified carotid artery: Secondary | ICD-10-CM | POA: Diagnosis not present

## 2022-10-24 DIAGNOSIS — N289 Disorder of kidney and ureter, unspecified: Secondary | ICD-10-CM | POA: Insufficient documentation

## 2022-10-24 DIAGNOSIS — E119 Type 2 diabetes mellitus without complications: Secondary | ICD-10-CM | POA: Diagnosis not present

## 2022-10-24 DIAGNOSIS — R7303 Prediabetes: Secondary | ICD-10-CM

## 2022-10-24 LAB — TYPE AND SCREEN
ABO/RH(D): A POS
Antibody Screen: NEGATIVE

## 2022-10-24 LAB — SURGICAL PCR SCREEN
MRSA, PCR: NEGATIVE
Staphylococcus aureus: NEGATIVE

## 2022-10-25 ENCOUNTER — Encounter (HOSPITAL_COMMUNITY): Payer: Self-pay | Admitting: Anesthesiology

## 2022-10-25 ENCOUNTER — Encounter (HOSPITAL_COMMUNITY): Payer: Self-pay | Admitting: Physician Assistant

## 2022-10-25 NOTE — Progress Notes (Signed)
Anesthesia Chart Review   Case: [id number] Date/Time: 11/05/22 1345   Procedure: TOTAL HIP ARTHROPLASTY ANTERIOR APPROACH (Right: Hip)   Anesthesia type: Choice   Pre-op diagnosis: right hip osteoarthritis   Location: [facility]OM 09 / [facility]   Surgeons: Ollen Gross, MD       DISCUSSION:77 y.o. former smoker with h/o HTN, CVA, atrial fibrillation, asymptomatic bradycardia, DM II, right hip OA scheduled for above procedure 11/05/2022 with Dr. Ollen Gross.   Per cardiology preoperative evaluation 10/02/22, "Chart reviewed as part of pre-operative protocol coverage. Given past medical history and time since last visit, based on ACC/AHA guidelines, Tiffany Wiggins is at acceptable risk for the planned procedure without further cardiovascular testing.  Pt is at elevated CV risk off of anticoagulation, however has renal dysfunction and memory loss so periprocedural bridging isn't ideal either.   Recommend patient hold Eliquis for 3 days prior to procedure and resume as soon as safely possible after given elevated CV Risk "  Pt seen by PCP 10/16/2022 for preoperative evaluation.  Per OV note, "This patient unfortunately is going to be moderately high to high risk for surgical complications. She is not optimized right now and that she needs to get a carotid Doppler to look at this carotid stenosis. If she is greater than 80% I would recommend she get that repaired prior to doing hip surgery. If the carotid Doppler comes back showing stability and her hemoglobin has improved with iron levels improved then she would be considered optimized for surgery, though she would remain at moderately high to high risk for surgical complications. She would need to be off of her Eliquis for at least 2 days and resume that as soon as possible after surgery. She is at risk of atrial fibrillation during the surgery and has a CHADS2 score of 9. They understand the risk of atrial fibrillation and potential stroke but would like to  proceed. Coronary disease appears stable. Hypertension is mildly elevated but stable. Diabetes is well-controlled."  Discussed PCP's recommendations with Dr. Deri Fuelling office. Will follow.  VS: BP (!) 125/55 Comment: right arm sitting  Pulse (!) 52   Temp 36.5 C (Oral)   Resp 16   Ht 5\' 6"  (1.676 m)   Wt 69.4 kg   SpO2 100%   BMI 24.69 kg/m   PROVIDERS: Gordan Payment., MD is PCP   Primary Cardiologist: Gypsy Balsam, MD  LABS:  labs in Care Everywhere (all labs ordered are listed, but only abnormal results are displayed)  Labs Reviewed  SURGICAL PCR SCREEN  TYPE AND SCREEN     IMAGES:   EKG:   CV: Echo 11/21/21  1. GLS -11.7. Left ventricular ejection fraction, by estimation, is 55 to  60%. The left ventricle has normal function. The left ventricle has no  regional wall motion abnormalities. Left ventricular diastolic parameters  are consistent with Grade I  diastolic dysfunction (impaired relaxation).   2. Right ventricular systolic function is normal. The right ventricular  size is normal. There is normal pulmonary artery systolic pressure.   3. The mitral valve is normal in structure. Mild mitral valve  regurgitation. No evidence of mitral stenosis.   4. The aortic valve is normal in structure. Aortic valve regurgitation is  not visualized. No aortic stenosis is present.   5. The inferior vena cava is normal in size with greater than 50%  respiratory variability, suggesting right atrial pressure of 3 mmHg.   Cardiac Cath 09/11/2021 Conclusions: Mild plaquing  of LMCA and mid LAD.  No significant atherosclerotic coronary artery disease identified to explain the patient's NSTEMI and cardiomyopathy. Normal left ventricular filling pressure (LVEDP 13 mmHg). Recommendations: Continue medical therapy for MI with nonobstructive coronary arteries (MINOCA). Escalate goal-directed medical therapy for nonischemic cardiomyopathy, as tolerated.  Myocardial Perfusion  08/15/2021   Findings are consistent with no  ischemia and no prior myocardial infarction. The study is low risk.   No ST deviation was noted.   Left ventricular function is normal. Nuclear stress EF: 78 %. The left ventricular ejection fraction is hyperdynamic (>65%). End diastolic cavity size is normal.   Prior study available for comparison from 06/17/2020. Past Medical History:  Diagnosis Date   Anxiety    Arthritis    CVA (cerebral vascular accident) (HCC) 2019   Diabetes mellitus type 2 in nonobese (HCC)    Hyperlipidemia    Hypertension    PCP GAVE FOR HER  BLOOD PRESSURE   IBS (irritable bowel syndrome)    Memory deficit    PUD (peptic ulcer disease)    TIA (transient ischemic attack) 2006    Past Surgical History:  Procedure Laterality Date   ABDOMINAL HYSTERECTOMY     BIOPSY THYROID  04/2014   COLON SURGERY     LAPAROSCOPY N/A 08/25/2015   Procedure: EXPLORATORY LAPAROTOMY, ILEOCECECTOMY, PRIMARY ANASTOMOSIS, LYSIS OF ADHESIONS;  Surgeon: Chevis Pretty III, MD;  Location: [facility];  Service: General;  Laterality: N/A;   laparoscopy for fertility work up     LEFT HEART CATH AND CORONARY ANGIOGRAPHY N/A 09/11/2021   Procedure: LEFT HEART CATH AND CORONARY ANGIOGRAPHY;  Surgeon: Yvonne Kendall, MD;  [facility]: [facility];  Service: Cardiovascular;  Laterality: N/A;   LOOP RECORDER IMPLANT N/A 03/24/2014   Procedure: LOOP RECORDER IMPLANT;  Surgeon: Duke Salvia, MD;  Location: [facility] CATH LAB;  Service: Cardiovascular;  Laterality: N/A;   LUMBAR LAMINECTOMY/DECOMPRESSION MICRODISCECTOMY  04/25/2011   Procedure: LUMBAR LAMINECTOMY/DECOMPRESSION MICRODISCECTOMY;  Surgeon: Karn Cassis, MD;  Location: [facility];  Service: Neurosurgery;  Laterality: Right;  Right Lumbar Four-Five Discectomy   OOPHORECTOMY     TONSILLECTOMY     TOTAL HIP ARTHROPLASTY Left 09/06/2021   Procedure: TOTAL HIP ARTHROPLASTY ANTERIOR APPROACH;  Surgeon: Ollen Gross, MD;  Location: [facility];   Service: Orthopedics;  Laterality: Left;    MEDICATIONS:  acetaminophen (TYLENOL) 650 MG CR tablet   amLODipine (NORVASC) 5 MG tablet   apixaban (ELIQUIS) 5 MG TABS tablet   azelastine (ASTELIN) 0.1 % nasal spray   Capsaicin (ASPERCREME PAIN RELIEF PATCH) 0.025 % PTCH   ferrous sulfate 324 (65 Fe) MG TBEC   furosemide (LASIX) 40 MG tablet   isosorbide mononitrate (IMDUR) 60 MG 24 hr tablet   losartan (COZAAR) 50 MG tablet   Magnesium 250 MG TABS   nitroGLYCERIN (NITROSTAT) 0.4 MG SL tablet   propranolol (INDERAL) 10 MG tablet   rosuvastatin (CRESTOR) 20 MG tablet   trolamine salicylate (ASPERCREME) 10 % cream   TURMERIC PO   No current facility-administered medications for this encounter.    Jodell Cipro Ward, PA-C [facility] [phone]

## 2022-10-30 NOTE — H&P (Signed)
TOTAL HIP ADMISSION H&P  Patient is admitted for right total hip arthroplasty.  Subjective:  Chief Complaint: Right hip pain  HPI: Tiffany Wiggins, 77 y.o. female, has a history of pain and functional disability in the right hip due to arthritis and patient has failed non-surgical conservative treatments for greater than 12 weeks to include use of assistive devices and activity modification. Onset of symptoms was gradual, starting several years ago with gradually worsening course since that time. The patient noted no past surgery on the right hip. Patient currently rates pain in the right hip at 8 out of 10 with activity. Patient has worsening of pain with activity and weight bearing and is at an increased fall risk. Patient has evidence of periarticular osteophytes and joint space narrowing by imaging studies. This condition presents safety issues increasing the risk of falls.  There is no current active infection.  Patient Active Problem List   Diagnosis Date Noted  . AKI (acute kidney injury) (HCC) 09/10/2021  . Acute systolic heart failure (HCC) 09/10/2021  . S/P total left hip arthroplasty 09/09/2021  . Primary osteoarthritis of left hip 09/06/2021  . Bradycardia 05/23/2021  . History of loop recorder 12/31/2019  . CAD (coronary artery disease) 12/10/2018  . NSTEMI (non-ST elevated myocardial infarction) (HCC) 12/08/2018  . Paroxysmal A-fib (HCC)   . Senile purpura (HCC) 12/17/2017  . Chronic pain of both shoulders 10/01/2017  . Moderate late onset Alzheimer's dementia (HCC) 09/16/2017  . Stage 3a chronic kidney disease (HCC) 05/29/2017  . Vitamin B12 deficiency 03/20/2017  . GERD without esophagitis 01/24/2017  . Degenerative lumbar disc 01/24/2017  . Bilateral carotid bruits 01/24/2017  . Atherosclerosis of both carotid arteries 01/24/2017  . History of CVA (cerebrovascular accident) 01/24/2017  . High risk medication use 01/24/2017  . Osteoarthritis of multiple joints 01/24/2017   . Vitamin D deficiency 01/24/2017  . Malaise and fatigue 01/24/2017  . Pain of left hip joint 01/14/2017  . Unilateral primary osteoarthritis, left hip 01/14/2017  . Cough 02/15/2016  . Wheezing 02/15/2016  . Perforation of cecum s/p R hemicolectomy with anastamosis (08/25/2015) 08/26/2015  . Preop cardiovascular exam 07/27/2015  . Thoracic back pain 04/25/2015  . Chronic venous insufficiency 12/21/2014  . Hip arthritis 08/10/2014  . Breast pain, right 07/22/2014  . Thyroid nodule 05/01/2014  . PALPITATIONS, CHRONIC 12/08/2009  . HIP PAIN, BILATERAL 09/15/2009  . OVERWEIGHT 03/08/2008  . Cerebral artery occlusion with cerebral infarction (HCC) 03/08/2008  . Hyperlipemia 01/30/2007  . Essential hypertension 01/30/2007  . Diabetes mellitus type 2, controlled, with complications (HCC) 01/30/2007    Past Medical History:  Diagnosis Date  . Anxiety   . Arthritis   . CVA (cerebral vascular accident) (HCC) 2019  . Diabetes mellitus type 2 in nonobese (HCC)   . Hyperlipidemia   . Hypertension    PCP GAVE FOR HER  BLOOD PRESSURE  . IBS (irritable bowel syndrome)   . Memory deficit   . PUD (peptic ulcer disease)   . TIA (transient ischemic attack) 2006    Past Surgical History:  Procedure Laterality Date  . ABDOMINAL HYSTERECTOMY    . BIOPSY THYROID  04/2014  . COLON SURGERY    . LAPAROSCOPY N/A 08/25/2015   Procedure: EXPLORATORY LAPAROTOMY, ILEOCECECTOMY, PRIMARY ANASTOMOSIS, LYSIS OF ADHESIONS;  Surgeon: Chevis Pretty III, MD;  Location: [facility];  Service: General;  Laterality: N/A;  . laparoscopy for fertility work up    . LEFT HEART CATH AND CORONARY ANGIOGRAPHY N/A 09/11/2021  Procedure: LEFT HEART CATH AND CORONARY ANGIOGRAPHY;  Surgeon: Yvonne Kendall, MD;  [facility];  Service: Cardiovascular;  Laterality: N/A;  . LOOP RECORDER IMPLANT N/A 03/24/2014   Procedure: LOOP RECORDER IMPLANT;  Surgeon: Duke Salvia, MD;  Location: [facility] CATH LAB;  Service:  Cardiovascular;  Laterality: N/A;  . LUMBAR LAMINECTOMY/DECOMPRESSION MICRODISCECTOMY  04/25/2011   Procedure: LUMBAR LAMINECTOMY/DECOMPRESSION MICRODISCECTOMY;  Surgeon: Karn Cassis, MD;  [facility]: [facility];  Service: Neurosurgery;  Laterality: Right;  Right Lumbar Four-Five Discectomy  . OOPHORECTOMY    . TONSILLECTOMY    . TOTAL HIP ARTHROPLASTY Left 09/06/2021   Procedure: TOTAL HIP ARTHROPLASTY ANTERIOR APPROACH;  Surgeon: Ollen Gross, MD;  Location: [facility];  Service: Orthopedics;  Laterality: Left;    Prior to Admission medications   Medication Sig Start Date End Date Taking? Authorizing Provider  acetaminophen (TYLENOL) 650 MG CR tablet Take 650 mg by mouth 2 (two) times daily.   Yes [provider]  amLODipine (NORVASC) 5 MG tablet Take 1 tablet (5 mg total) by mouth daily. 07/06/21 10/18/22 Yes Georgeanna Lea, MD  apixaban (ELIQUIS) 5 MG TABS tablet Take 1 tablet (5 mg total) by mouth 2 (two) times daily. 12/10/18  Yes Burnadette Pop, MD  azelastine (ASTELIN) 0.1 % nasal spray Place 2 sprays into both nostrils daily as needed for rhinitis or allergies. 05/10/20  Yes [provider]  Capsaicin (ASPERCREME PAIN RELIEF PATCH) 0.025 % PTCH Apply 1 patch topically daily as needed (Back pain).   Yes [provider]  ferrous sulfate 324 (65 Fe) MG TBEC Take 65 mg by mouth daily.   Yes [provider]  furosemide (LASIX) 40 MG tablet Take 1 tablet (40 mg total) by mouth daily. 12/05/21  Yes Meriam Sprague, MD  isosorbide mononitrate (IMDUR) 60 MG 24 hr tablet Take 1 tablet (60 mg total) by mouth daily. 10/08/22  Yes Meriam Sprague, MD  losartan (COZAAR) 50 MG tablet Take 1 tablet by mouth once daily Patient taking differently: Take 25 mg by mouth daily. 06/19/21  Yes Duke Salvia, MD  Magnesium 250 MG TABS Take 250 mg by mouth daily.   Yes [provider]  nitroGLYCERIN (NITROSTAT) 0.4 MG SL tablet Place 1 tablet (0.4 mg total)  under the tongue every 5 (five) minutes as needed for chest pain. 07/06/21  Yes Georgeanna Lea, MD  propranolol (INDERAL) 10 MG tablet Take 10 mg by mouth daily as needed (racing heart).   Yes [provider]  rosuvastatin (CRESTOR) 20 MG tablet Take 1 tablet (20 mg total) by mouth daily. 09/13/21 10/18/22 Yes Narda Bonds, MD  trolamine salicylate (ASPERCREME) 10 % cream Apply 1 Application topically daily as needed for muscle pain (Back pain).   Yes [provider]  TURMERIC PO Take 1,000 mg by mouth daily.   Yes [provider]    Allergies  Allergen Reactions  . Codeine Other (See Comments)    Delirious  . Morphine And Codeine Other (See Comments)    Delirious  . Ativan [Lorazepam] Other (See Comments)    Delirious and combative  . Dilaudid [Hydromorphone] Other (See Comments)    delirious  . Statins Other (See Comments)    Social History   Socioeconomic History  . Marital status: Married    Spouse name: Not on file  . Number of children: 0  . Years of education: Not on file  . Highest education level: Not on file  Occupational History  . Occupation: retired  Tobacco Use  . Smoking status: Former    Current packs/day: 0.00    Types: Cigarettes    Quit date: 04/02/2004    Years since quitting: 18.5  . Smokeless tobacco: Never  Vaping Use  . Vaping status: Never Used  Substance and Sexual Activity  . Alcohol use: Yes    Alcohol/week: 2.0 standard drinks of alcohol    Types: 2 Glasses of wine per week  . Drug use: No  . Sexual activity: Not on file  Other Topics Concern  . Not on file  Social History Narrative  . Not on file   Social Determinants of Health   Financial Resource Strain: Not on file  Food Insecurity: Not on file  Transportation Needs: Not on file  Physical Activity: Unknown (08/29/2021)   Received from [facility] visits prior to 06/02/2022.   Exercise Vital Sign  Stress: Not on file  Social  Connections: Not on file  Intimate Partner Violence: Not on file    Tobacco Use: Medium Risk (10/24/2022)   Patient History   . Smoking Tobacco Use: Former   . Smokeless Tobacco Use: Never   . Passive Exposure: Not on file   Social History   Substance and Sexual Activity  Alcohol Use Yes  . Alcohol/week: 2.0 standard drinks of alcohol  . Types: 2 Glasses of wine per week    Family History  Problem Relation Age of Onset  . Cancer Sister        lung  . Diabetes Sister   . Cancer Brother        lung  . Colon cancer Paternal Uncle     ROS   Objective:  Physical Exam: ***  Vital signs in last 24 hours:    Imaging Review Plain radiographs demonstrate {mild/mod/severe:3049053} degenerative joint disease of the right hip. The bone quality appears to be {good/fair/poor/excellent:33178} for age and reported activity level.  Assessment/Plan:  End stage arthritis, right hip  The patient history, physical examination, clinical judgement of the provider and imaging studies are consistent with end stage degenerative joint disease of the right hip and total hip arthroplasty is deemed medically necessary. The treatment options including medical management, injection therapy, arthroscopy and arthroplasty were discussed at length. The risks and benefits of total hip arthroplasty were presented and reviewed. The risks due to aseptic loosening, infection, stiffness, dislocation/subluxation, thromboembolic complications and other imponderables were discussed. The patient acknowledged the explanation, agreed to proceed with the plan and consent was signed. Patient is being admitted for inpatient treatment for surgery, pain control, PT, OT, prophylactic antibiotics, VTE prophylaxis, progressive ambulation and ADLs and discharge planning.The patient is planning to be discharged  home with husband .   Patient's anticipated LOS is less than 2 midnights, meeting these requirements: - Younger  than 40 - Lives within 1 hour of care - Has a competent adult at home to recover with post-op recover - NO history of  - Chronic pain requiring opiods  - Diabetes  - Coronary Artery Disease  - Heart failure  - Heart attack  - Stroke  - DVT/VTE  - Cardiac arrhythmia  - Respiratory Failure/COPD  - Renal failure  - Anemia  - Advanced Liver disease    Therapy Plans: HEP Disposition: Home with husband Planned DVT Prophylaxis: Eliquis DME Needed: None PCP: Feliciana Rossetti, MD (NOT optimized; clearance is pending CBC, EKG, and carotid doppler results) Cardiologist: Gypsy Balsam, MD (clearance received)  TXA: Topical (hx TIA 2006) Allergies: codeine, morphine Anesthesia Concerns: Restless while waking per pt BMI: 25.2 Last HgbA1c: 6.3% on 08/15/22 Pharmacy: [facility] ([address], [place])  Other: -Per cardiology clearance letter - patient is at elevated risk off of anti-coagulation leading up to surgery but bridging not recommended due to CKD and dementia. Recommend to stop Eliquis 3 days prior to surgery and restart as soon as possible after surgery -Hgb 12.2 on 10/16/22 (care everywhere in Epic) -NO NARCOTICS. Plan for methocarbamol, gabapentin, tylenol   - Patient was instructed on what medications to stop prior to surgery. - Follow-up visit in 2 weeks with Dr. Lequita Halt - Begin physical therapy following surgery - Pre-operative lab work as pre-surgical testing - Prescriptions will be provided in hospital at time of discharge  Tiffany Brass, PA-C Orthopedic Surgery [facility] [place]

## 2022-11-04 NOTE — Anesthesia Preprocedure Evaluation (Signed)
Anesthesia Evaluation    Reviewed: Allergy & Precautions, Patient's Chart, lab work & pertinent test results, Unable to perform ROS - Chart review only  Airway Mallampati: III  TM Distance: <3 FB Neck ROM: Full    Dental no notable dental hx. (+) Teeth Intact, Dental Advisory Given   Pulmonary former smoker   Pulmonary exam normal        Cardiovascular hypertension, + CAD and + Past MI (non stemi)  Normal cardiovascular exam+ dysrhythmias (on eliquis last dose) Atrial Fibrillation   10/2021 echo 1. GLS -11.7. Left ventricular ejection fraction, by estimation, is 55 to  60%. The left ventricle has normal function. The left ventricle has no  regional wall motion abnormalities. Left ventricular diastolic parameters  are consistent with Grade I  diastolic dysfunction (impaired relaxation).   2. Right ventricular systolic function is normal. The right ventricular  size is normal. There is normal pulmonary artery systolic pressure.   3. The mitral valve is normal in structure. Mild mitral valve  regurgitation. No evidence of mitral stenosis.   4. The aortic valve is normal in structure. Aortic valve regurgitation is  not visualized. No aortic stenosis is present.   5. The inferior vena cava is normal in size with greater than 50%  respiratory variability, suggesting right atrial pressure of 3 mmHg.    Non ischemic cardiomyopathy   Neuro/Psych   Anxiety    Dementia Only residual forgetfulness TIACVA    GI/Hepatic ,GERD  ,,  Endo/Other  diabetes, Well Controlled, Type 2    Renal/GU Renal diseaseLab Results      Component                Value               Date                      CREATININE               1.45 (H)            08/09/2021              K                        4.2                 08/09/2021                    Musculoskeletal  (+) Arthritis , Osteoarthritis,    Abdominal   Peds  Hematology Lab Results       Component                Value               Date                      WBC                      9.5                 08/24/2021                HGB                      10.9 (L)            08/24/2021  HCT                      34.6 (L)            08/24/2021                MCV                      103.6 (H)           08/24/2021                PLT                      188                 08/24/2021              Anesthesia Other Findings   Reproductive/Obstetrics                              Anesthesia Physical Anesthesia Plan  ASA: 3  Anesthesia Plan: Spinal   Post-op Pain Management: Precedex   Induction:   PONV Risk Score and Plan: Propofol infusion, Treatment may vary due to age or medical condition and Ondansetron  Airway Management Planned: Natural Airway and Nasal Cannula  Additional Equipment: None  Intra-op Plan:   Post-operative Plan: Extubation in OR  Informed Consent:      Dental advisory given  Plan Discussed with: CRNA  Anesthesia Plan Comments: (See PAT note 08/24/2021)         Anesthesia Quick Evaluation

## 2022-11-05 ENCOUNTER — Ambulatory Visit (HOSPITAL_COMMUNITY): Admission: RE | Admit: 2022-11-05 | Payer: Medicare Other | Source: Ambulatory Visit | Admitting: Orthopedic Surgery

## 2022-11-05 ENCOUNTER — Encounter (HOSPITAL_COMMUNITY): Admission: RE | Payer: Self-pay | Source: Ambulatory Visit

## 2022-11-05 SURGERY — ARTHROPLASTY, HIP, TOTAL, ANTERIOR APPROACH
Anesthesia: Choice | Site: Hip | Laterality: Right

## 2022-11-06 ENCOUNTER — Other Ambulatory Visit: Payer: Self-pay

## 2022-11-06 MED ORDER — ISOSORBIDE MONONITRATE ER 60 MG PO TB24: 60.0000 mg | ORAL_TABLET | Freq: Every day | ORAL | 0 refills | Status: DC

## 2022-11-14 ENCOUNTER — Other Ambulatory Visit: Payer: Self-pay | Admitting: Cardiology

## 2022-11-25 NOTE — Patient Instructions (Signed)
SURGICAL WAITING ROOM VISITATION Patients having surgery or a procedure may have no more than 2 support people in the waiting area - these visitors may rotate in the visitor waiting room.   Due to an increase in RSV and influenza rates and associated hospitalizations, children ages 57 and under may not visit patients in [facility] hospitals. If the patient needs to stay at the hospital during part of their recovery, the visitor guidelines for inpatient rooms apply.  PRE-OP VISITATION  Pre-op nurse will coordinate an appropriate time for 1 support person to accompany the patient in pre-op.  This support person may not rotate.  This visitor will be contacted when the time is appropriate for the visitor to come back in the pre-op area.  Please refer to the [facility] website for the visitor guidelines for Inpatients (after your surgery is over and you are in a regular room).  You are not required to quarantine at this time prior to your surgery. However, you must do this: Hand Hygiene often Do NOT share personal items Notify your provider if you are in close contact with someone who has COVID or you develop fever 100.4 or greater, new onset of sneezing, cough, sore throat, shortness of breath or body aches.  If you test positive for Covid or have been in contact with anyone that has tested positive in the last 10 days please notify you surgeon.    Your procedure is scheduled on:  Monday   December 10, 2022  Report to [facility] Main Entrance: [place] entrance where the [employer] is available.   Report to admitting at:  11:30   AM  Call this number if you have any questions or problems the morning of surgery [phone]  Do not eat food after Midnight the night prior to your surgery/procedure.  After Midnight you may have the following liquids until   ???11:00 AM  DAY OF SURGERY  Clear Liquid Diet Water Black Coffee (sugar ok, NO MILK/CREAM OR CREAMERS)  Tea (sugar ok,  NO MILK/CREAM OR CREAMERS) regular and decaf                             Plain Jell-O  with no fruit (NO RED)                                           Fruit ices (not with fruit pulp, NO RED)                                     Popsicles (NO RED)                                                                  Juice: NO CITRUS JUICES: only apple, WHITE grape, WHITE cranberry Sports drinks like Gatorade or Powerade (NO RED)                   The day of surgery:  Drink ONE (1) Pre-Surgery G2 at ??? 11:00   AM the  morning of surgery. Drink in one sitting. Do not sip.  This drink was given to you during your hospital pre-op appointment visit. Nothing else to drink after completing the Pre-Surgery Clear Ensure or G2 : No candy, chewing gum or throat lozenges.    FOLLOW ANY ADDITIONAL PRE OP INSTRUCTIONS YOU RECEIVED FROM YOUR SURGEON'S OFFICE!!!   Oral Hygiene is also important to reduce your risk of infection.        Remember - BRUSH YOUR TEETH THE MORNING OF SURGERY WITH YOUR REGULAR TOOTHPASTE  Do NOT smoke after Midnight the night before surgery.  STOP TAKING all Vitamins, Herbs and supplements 1 week before your surgery.   Take ONLY these medicines the morning of surgery with A SIP OF WATER: amlodipine, propranolol as needed for rapid Heart rate.  You may take Tylenol if needed for pain.    You may not have any metal on your body including hair pins, jewelry, and body piercing  Do not wear make-up, lotions, powders, perfumes  or deodorant  Do not wear nail polish including gel and S&S, artificial / acrylic nails, or any other type of covering on natural nails including finger and toenails. If you have artificial nails, gel coating, etc., that needs to be removed by a nail salon, Please have this removed prior to surgery. Not doing so may mean that your surgery could be cancelled or delayed if the Surgeon or anesthesia staff feels like they are unable to monitor you safely.   Do not  shave 48 hours prior to surgery to avoid nicks in your skin which may contribute to postoperative infections.   Contacts, Hearing Aids, dentures or bridgework may not be worn into surgery. DENTURES WILL BE REMOVED PRIOR TO SURGERY PLEASE DO NOT APPLY "Poly grip" OR ADHESIVES!!!  You may bring a small overnight bag with you on the day of surgery, only pack items that are not valuable. [facility] IS NOT RESPONSIBLE   FOR VALUABLES THAT ARE LOST OR STOLEN.   Do not bring your home medications to the hospital. The Pharmacy will dispense medications listed on your medication list to you during your admission in the Hospital.  Special Instructions: Bring a copy of your healthcare power of attorney and living will documents the day of surgery, if you wish to have them scanned into your [facility] [place] [facility] Records- EPIC  Please read over the following fact sheets you were given: IF YOU HAVE QUESTIONS ABOUT YOUR PRE-OP INSTRUCTIONS, PLEASE CALL [phone].     Pre-operative 5 CHG Bath Instructions   You can play a key role in reducing the risk of infection after surgery. Your skin needs to be as free of germs as possible. You can reduce the number of germs on your skin by washing with CHG (chlorhexidine gluconate) soap before surgery. CHG is an antiseptic soap that kills germs and continues to kill germs even after washing.   DO NOT use if you have an allergy to chlorhexidine/CHG or antibacterial soaps. If your skin becomes reddened or irritated, stop using the CHG and notify one of our RNs at [phone]  Please shower with the CHG soap starting 4 days before surgery using the following schedule: START SHOWERS ON  THURSDAY  December 06, 2022  Please keep in mind the following:  DO NOT shave, including legs and  underarms, starting the day of your first shower.   You may shave your face at any point before/day of surgery.   Place clean sheets on your bed the day you start using CHG soap. Use a clean washcloth (not used since being washed) for each shower. DO NOT sleep with pets once you start using the CHG.   CHG Shower Instructions:  If you choose to wash your hair and private area, wash first with your normal shampoo/soap.  After you use shampoo/soap, rinse your hair and body thoroughly to remove shampoo/soap residue.  Turn the water OFF and apply about 3 tablespoons (45 ml) of CHG soap to a CLEAN washcloth.  Apply CHG soap ONLY FROM YOUR NECK DOWN TO YOUR TOES (washing for 3-5 minutes)  DO NOT use CHG soap on face, private areas, open wounds, or sores.  Pay special attention to the area where your surgery is being performed.  If you are having back surgery, having someone wash your back for you may be helpful.  Wait 2 minutes after CHG soap is applied, then you may rinse off the CHG soap.  Pat dry with a clean towel  Put on clean clothes/pajamas   If you choose to wear lotion, please use ONLY the CHG-compatible lotions on the back of this paper.     Additional instructions for the day of surgery: DO NOT APPLY any lotions, deodorants, cologne, or perfumes.   Put on clean/comfortable clothes.  Brush your teeth.  Ask your nurse before applying any prescription medications to the skin.      CHG Compatible Lotions   Aveeno Moisturizing lotion  Cetaphil Moisturizing Cream  Cetaphil Moisturizing Lotion  Clairol Herbal Essence Moisturizing Lotion, Dry Skin  Clairol Herbal Essence Moisturizing Lotion, Extra Dry Skin  Clairol Herbal Essence Moisturizing Lotion, Normal Skin  Curel Age Defying Therapeutic Moisturizing Lotion with Alpha Hydroxy  Curel Extreme Care Body Lotion  Curel Soothing Hands Moisturizing Hand Lotion  Curel Therapeutic Moisturizing Cream, Fragrance-Free  Curel  Therapeutic Moisturizing Lotion, Fragrance-Free  Curel Therapeutic Moisturizing Lotion, Original Formula  Eucerin Daily Replenishing Lotion  Eucerin Dry Skin Therapy Plus Alpha Hydroxy Crme  Eucerin Dry Skin Therapy Plus Alpha Hydroxy Lotion  Eucerin Original Crme  Eucerin Original Lotion  Eucerin Plus Crme Eucerin Plus Lotion  Eucerin TriLipid Replenishing Lotion  Keri Anti-Bacterial Hand Lotion  Keri Deep Conditioning Original Lotion Dry Skin Formula Softly Scented  Keri Deep Conditioning Original Lotion, Fragrance Free Sensitive Skin Formula  Keri Lotion Fast Absorbing Fragrance Free Sensitive Skin Formula  Keri Lotion Fast Absorbing Softly Scented Dry Skin Formula  Keri Original Lotion  Keri Skin Renewal Lotion Keri Silky Smooth Lotion  Keri Silky Smooth Sensitive Skin Lotion  Nivea Body Creamy Conditioning Oil  Nivea Body Extra Enriched Lotion  Nivea Body Original Lotion  Nivea Body Sheer Moisturizing Lotion Nivea Crme  Nivea Skin Firming Lotion  NutraDerm 30 Skin Lotion  NutraDerm Skin Lotion  NutraDerm Therapeutic Skin Cream  NutraDerm Therapeutic Skin Lotion  ProShield Protective Hand Cream  Provon moisturizing lotion   FAILURE TO FOLLOW THESE INSTRUCTIONS MAY RESULT IN THE CANCELLATION OF YOUR SURGERY  PATIENT SIGNATURE_________________________________  NURSE SIGNATURE__________________________________  ________________________________________________________________________     Tiffany Wiggins    An incentive spirometer is a tool that can help keep your lungs clear and active. This tool measures how well you are filling your lungs with each breath. Taking long deep  breaths may help reverse or decrease the chance of developing breathing (pulmonary) problems (especially infection) following: A long period of time when you are unable to move or be active. BEFORE THE PROCEDURE  If the spirometer includes an indicator to show your best effort, your nurse  or respiratory therapist will set it to a desired goal. If possible, sit up straight or lean slightly forward. Try not to slouch. Hold the incentive spirometer in an upright position. INSTRUCTIONS FOR USE  Sit on the edge of your bed if possible, or sit up as far as you can in bed or on a chair. Hold the incentive spirometer in an upright position. Breathe out normally. Place the mouthpiece in your mouth and seal your lips tightly around it. Breathe in slowly and as deeply as possible, raising the piston or the ball toward the top of the column. Hold your breath for 3-5 seconds or for as long as possible. Allow the piston or ball to fall to the bottom of the column. Remove the mouthpiece from your mouth and breathe out normally. Rest for a few seconds and repeat Steps 1 through 7 at least 10 times every 1-2 hours when you are awake. Take your time and take a few normal breaths between deep breaths. The spirometer may include an indicator to show your best effort. Use the indicator as a goal to work toward during each repetition. After each set of 10 deep breaths, practice coughing to be sure your lungs are clear. If you have an incision (the cut made at the time of surgery), support your incision when coughing by placing a pillow or rolled up towels firmly against it. Once you are able to get out of bed, walk around indoors and cough well. You may stop using the incentive spirometer when instructed by your caregiver.  RISKS AND COMPLICATIONS Take your time so you do not get dizzy or light-headed. If you are in pain, you may need to take or ask for pain medication before doing incentive spirometry. It is harder to take a deep breath if you are having pain. AFTER USE Rest and breathe slowly and easily. It can be helpful to keep track of a log of your progress. Your caregiver can provide you with a simple table to help with this. If you are using the spirometer at home, follow these  instructions: SEEK MEDICAL CARE IF:  You are having difficultly using the spirometer. You have trouble using the spirometer as often as instructed. Your pain medication is not giving enough relief while using the spirometer. You develop fever of 100.5 F (38.1 C) or higher.                                                                                                    SEEK IMMEDIATE MEDICAL CARE IF:  You cough up bloody sputum that had not been present before. You develop fever of 102 F (38.9 C) or greater. You develop worsening pain at or near the incision site. MAKE SURE YOU:  Understand these instructions. Will watch your condition. Will get  help right away if you are not doing well or get worse. Document Released: 07/30/2006 Document Revised: 06/11/2011 Document Reviewed: 09/30/2006 [facility] Patient Information 2014 [place], [place].     WHAT IS A BLOOD TRANSFUSION? Blood Transfusion Information  A transfusion is the replacement of blood or some of its parts. Blood is made up of multiple cells which provide different functions. Red blood cells carry oxygen and are used for blood loss replacement. White blood cells fight against infection. Platelets control bleeding. Plasma helps clot blood. Other blood products are available for specialized needs, such as hemophilia or other clotting disorders. BEFORE THE TRANSFUSION  Who gives blood for transfusions?  Healthy volunteers who are fully evaluated to make sure their blood is safe. This is blood bank blood. Transfusion therapy is the safest it has ever been in the practice of medicine. Before blood is taken from a donor, a complete history is taken to make sure that person has no history of diseases nor engages in risky social behavior (examples are intravenous drug use or sexual activity with multiple partners). The donor's travel history is screened to minimize risk of transmitting infections, such as malaria. The donated blood  is tested for signs of infectious diseases, such as HIV and hepatitis. The blood is then tested to be sure it is compatible with you in order to minimize the chance of a transfusion reaction. If you or a relative donates blood, this is often done in anticipation of surgery and is not appropriate for emergency situations. It takes many days to process the donated blood. RISKS AND COMPLICATIONS Although transfusion therapy is very safe and saves many lives, the main dangers of transfusion include:  Getting an infectious disease. Developing a transfusion reaction. This is an allergic reaction to something in the blood you were given. Every precaution is taken to prevent this. The decision to have a blood transfusion has been considered carefully by your caregiver before blood is given. Blood is not given unless the benefits outweigh the risks. AFTER THE TRANSFUSION Right after receiving a blood transfusion, you will usually feel much better and more energetic. This is especially true if your red blood cells have gotten low (anemic). The transfusion raises the level of the red blood cells which carry oxygen, and this usually causes an energy increase. The nurse administering the transfusion will monitor you carefully for complications. HOME CARE INSTRUCTIONS  No special instructions are needed after a transfusion. You may find your energy is better. Speak with your caregiver about any limitations on activity for underlying diseases you may have. SEEK MEDICAL CARE IF:  Your condition is not improving after your transfusion. You develop redness or irritation at the intravenous (IV) site. SEEK IMMEDIATE MEDICAL CARE IF:  Any of the following symptoms occur over the next 12 hours: Shaking chills. You have a temperature by mouth above 102 F (38.9 C), not controlled by medicine. Chest, back, or muscle pain. People around you feel you are not acting correctly or are confused. Shortness of breath or  difficulty breathing. Dizziness and fainting. You get a rash or develop hives. You have a decrease in urine output. Your urine turns a dark color or changes to pink, red, or brown. Any of the following symptoms occur over the next 10 days: You have a temperature by mouth above 102 F (38.9 C), not controlled by medicine. Shortness of breath. Weakness after normal activity. The white part of the eye turns yellow (jaundice). You have a decrease in the  amount of urine or are urinating less often. Your urine turns a dark color or changes to pink, red, or brown. Document Released: 03/16/2000 Document Revised: 06/11/2011 Document Reviewed: 11/03/2007 [facility] Patient Information 2014 [place], [place].  _______________________________________________________________________

## 2022-11-25 NOTE — Progress Notes (Signed)
COVID Vaccine received:  []  No [x]  Yes Date of any COVID positive Test in last 90 days:  PCP - Feliciana Rossetti, MD [facility] [phone] Primary care [place] [phone]  Fax [phone] Cardiologist - Laurance Flatten, MD    Robin Searing NP 10-02-2022 note cardiac clearance EP- Sherryl Manges, MD Vascular Surgery-  Marylene Buerger, MD at [facility]  Phone: [phone] Fa x: [phone]    Chest x-ray - 09-09-2021  1v  Epic EKG -  09-20-2022 Epic Stress Test -  ECHO - 11-22-2021  Epic Cardiac Cath - 09-11-2021  LHC by Dr End Carotid Doppler- 11-12-2022  [facility]  CEW    PCR screen: [x]  Ordered & Completed           []   No Order but Needs PROFEND           []   N/A for this surgery  Surgery Plan:  []  Ambulatory   [x]  Outpatient in bed  []  Admit  Anesthesia:    []  General  []  Spinal  [x]   Choice []   MAC  Pacemaker / ICD device [x]  No []  Yes   Spinal Cord Stimulator:[x]  No []  Yes       History of Sleep Apnea? [x]  No []  Yes   CPAP used?- [x]  No []  Yes    Does the patient monitor blood sugar?  [x]  No []  Yes  []  N/A  Patient has: []  NO Hx DM   [x]  Pre-DM   []  DM1  []   DM2 Last A1c was: 6.3 on  08-15-2022     Blood Thinner / Instructions:  Eliquis  Hold x 3 days,  Last dose on 12-06-22  okay per Robin Searing NP Aspirin Instructions:  none  ERAS Protocol Ordered: []  No  []  Yes PRE-SURGERY []  ENSURE  []  G2   []  No Drink Ordered Patient is to be NPO after:   Comments:   REQUESTED ORDERS FROM SURGEON and Left message with Tresa Endo on 11-28-2022  Patient was given the 5 CHG shower / bath instructions for THA surgery along with 2 bottles of the CHG soap. Patient will start this on:   Thursday 12-06-2022  All questions were asked and answered, Patient voiced understanding of this process.   Activity level: Patient is unable to climb a flight of stairs without difficulty; [x]  No CP  but would have SOB.  Patient can perform ADLs without assistance.   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 60-70% bilateral   Patient denies shortness of breath, fever, cough and chest pain at PAT appointment.  Patient verbalized understanding and agreement to the Pre-Surgical Instructions that were given to them at this PAT appointment. Patient was also educated of the need to review these PAT instructions again prior to her surgery.I reviewed the appropriate phone numbers to call if they have any and questions or concerns.

## 2022-11-28 ENCOUNTER — Encounter (HOSPITAL_COMMUNITY)
Admission: RE | Admit: 2022-11-28 | Discharge: 2022-11-28 | Disposition: A | Discharge status: Home / Self Care | Payer: Medicare Other | Source: Ambulatory Visit | Attending: Orthopedic Surgery | Admitting: Orthopedic Surgery

## 2022-11-28 ENCOUNTER — Encounter (HOSPITAL_COMMUNITY): Payer: Self-pay

## 2022-11-28 ENCOUNTER — Other Ambulatory Visit: Payer: Self-pay

## 2022-11-28 VITALS — BP 133/65 | Temp 98.0°F | Resp 20 | Ht 66.0 in | Wt 152.0 lb

## 2022-11-28 DIAGNOSIS — I129 Hypertensive chronic kidney disease with stage 1 through stage 4 chronic kidney disease, or unspecified chronic kidney disease: Secondary | ICD-10-CM | POA: Insufficient documentation

## 2022-11-28 DIAGNOSIS — N183 Chronic kidney disease, stage 3 unspecified: Secondary | ICD-10-CM | POA: Diagnosis not present

## 2022-11-28 DIAGNOSIS — I2510 Atherosclerotic heart disease of native coronary artery without angina pectoris: Secondary | ICD-10-CM | POA: Insufficient documentation

## 2022-11-28 DIAGNOSIS — E1122 Type 2 diabetes mellitus with diabetic chronic kidney disease: Secondary | ICD-10-CM | POA: Diagnosis not present

## 2022-11-28 DIAGNOSIS — M1611 Unilateral primary osteoarthritis, right hip: Secondary | ICD-10-CM | POA: Insufficient documentation

## 2022-11-28 DIAGNOSIS — Z79899 Other long term (current) drug therapy: Secondary | ICD-10-CM | POA: Insufficient documentation

## 2022-11-28 DIAGNOSIS — Z8673 Personal history of transient ischemic attack (TIA), and cerebral infarction without residual deficits: Secondary | ICD-10-CM | POA: Diagnosis not present

## 2022-11-28 DIAGNOSIS — I252 Old myocardial infarction: Secondary | ICD-10-CM | POA: Insufficient documentation

## 2022-11-28 DIAGNOSIS — Z01812 Encounter for preprocedural laboratory examination: Secondary | ICD-10-CM | POA: Insufficient documentation

## 2022-11-28 DIAGNOSIS — Z01818 Encounter for other preprocedural examination: Secondary | ICD-10-CM

## 2022-11-28 DIAGNOSIS — Z7901 Long term (current) use of anticoagulants: Secondary | ICD-10-CM | POA: Insufficient documentation

## 2022-11-28 DIAGNOSIS — R001 Bradycardia, unspecified: Secondary | ICD-10-CM | POA: Diagnosis not present

## 2022-11-28 DIAGNOSIS — I10 Essential (primary) hypertension: Secondary | ICD-10-CM

## 2022-11-28 DIAGNOSIS — Z87891 Personal history of nicotine dependence: Secondary | ICD-10-CM | POA: Diagnosis not present

## 2022-11-28 HISTORY — DX: Anemia, unspecified: D64.9

## 2022-11-28 HISTORY — DX: Occlusion and stenosis of bilateral carotid arteries: I65.23

## 2022-11-28 LAB — TYPE AND SCREEN
ABO/RH(D): A POS
Antibody Screen: NEGATIVE

## 2022-11-28 LAB — BASIC METABOLIC PANEL
Anion gap: 12 (ref 5–15)
BUN: 47 mg/dL — ABNORMAL HIGH (ref 8–23)
CO2: 27 mmol/L (ref 22–32)
Calcium: 9.7 mg/dL (ref 8.9–10.3)
Chloride: 103 mmol/L (ref 98–111)
Creatinine, Ser: 1.72 mg/dL — ABNORMAL HIGH (ref 0.44–1.00)
GFR, Estimated: 31 mL/min — ABNORMAL LOW (ref >60)
Glucose, Bld: 120 mg/dL — ABNORMAL HIGH (ref 70–99)
Potassium: 4.1 mmol/L (ref 3.5–5.1)
Sodium: 142 mmol/L (ref 135–145)

## 2022-11-28 LAB — SURGICAL PCR SCREEN
MRSA, PCR: NEGATIVE
Staphylococcus aureus: NEGATIVE

## 2022-11-28 LAB — CBC
HCT: 33.9 % — ABNORMAL LOW (ref 36.0–46.0)
Hemoglobin: 10.7 g/dL — ABNORMAL LOW (ref 12.0–15.0)
MCH: 32.7 pg (ref 26.0–34.0)
MCHC: 31.6 g/dL (ref 30.0–36.0)
MCV: 103.7 fL — ABNORMAL HIGH (ref 80.0–100.0)
Platelets: 170 10*3/uL (ref 150–400)
RBC: 3.27 MIL/uL — ABNORMAL LOW (ref 3.87–5.11)
RDW: 12.5 % (ref 11.5–15.5)
WBC: 7.6 10*3/uL (ref 4.0–10.5)
nRBC: 0 % (ref 0.0–0.2)

## 2022-11-30 ENCOUNTER — Telehealth: Payer: Self-pay | Admitting: Internal Medicine

## 2022-11-30 ENCOUNTER — Other Ambulatory Visit: Payer: Self-pay

## 2022-11-30 MED ORDER — ISOSORBIDE MONONITRATE ER 60 MG PO TB24: 60.0000 mg | ORAL_TABLET | Freq: Every day | ORAL | 3 refills | Status: DC

## 2022-11-30 NOTE — Telephone Encounter (Signed)
Dr. Bing Matter is pt's primary Cardiologist. Please address

## 2022-11-30 NOTE — Telephone Encounter (Signed)
*  STAT* If patient is at the pharmacy, call can be transferred to refill team.   1. Which medications need to be refilled? (please list name of each medication and dose if known)   isosorbide mononitrate (IMDUR) 60 MG 24 hr tablet   2. Would you like to learn more about the convenience, safety, & potential cost savings by using the [facility]?   3. Are you open to using the [facility] (Type [facility]. ).  4. Which pharmacy/location (including street and city if local pharmacy) is medication to be sent to?  [id number] - [place], [place] - [address]   5. Do they need a 30 day or 90 day supply?   90 day  Caller (Robin) stated patient still has some medication left.

## 2022-12-04 NOTE — H&P (View-Only) (Signed)
Anesthesia Chart Review   Case: [id number] Date/Time: 12/10/22 1345   Procedure: TOTAL HIP ARTHROPLASTY ANTERIOR APPROACH (Right: Hip)   Anesthesia type: Choice   Pre-op diagnosis: right hip osteoarthritis   Location: [facility]OM 09 / [facility]   Surgeons: Ollen Gross, MD       DISCUSSION:77 y.o. former smoker with h/o HTN, CVA, atrial fibrillation, asymptomatic bradycardia, CAD (NSTEMI 2023, no stenting), CKD Stage III, DM II, right hip OA scheduled for above procedure 11/05/2022 with Dr. Ollen Gross.    Case previously cancelled due to need for carotid doppler. Carotid studies 11/12/2022 with bilateral stenosis of 60-79%.  Seen by vascular surgery 11/12/2022. Per notes no intervention since pt is asymptomatic.  She will have a repeat carotid study in 1 year.    Per previous anesthesia note,   "Per cardiology preoperative evaluation 10/02/22, "Chart reviewed as part of pre-operative protocol coverage. Given past medical history and time since last visit, based on ACC/AHA guidelines, JERMIYA LAZZARA is at acceptable risk for the planned procedure without further cardiovascular testing.  Pt is at elevated CV risk off of anticoagulation, however has renal dysfunction and memory loss so periprocedural bridging isn't ideal either.   Recommend patient hold Eliquis for 3 days prior to procedure and resume as soon as safely possible after given elevated CV Risk "   Pt seen by PCP 10/16/2022 for preoperative evaluation.  Per OV note, "This patient unfortunately is going to be moderately high to high risk for surgical complications. She is not optimized right now and that she needs to get a carotid Doppler to look at this carotid stenosis. If she is greater than 80% I would recommend she get that repaired prior to doing hip surgery. If the carotid Doppler comes back showing stability and her hemoglobin has improved with iron levels improved then she would be considered optimized for surgery, though she would remain  at moderately high to high risk for surgical complications. She would need to be off of her Eliquis for at least 2 days and resume that as soon as possible after surgery. She is at risk of atrial fibrillation during the surgery and has a CHADS2 score of 9. They understand the risk of atrial fibrillation and potential stroke but would like to proceed. Coronary disease appears stable. Hypertension is mildly elevated but stable. Diabetes is well-controlled.""   VS: BP 133/65 Comment: right arm sitting  Temp 36.7 C (Oral)   Resp 20   Ht 5\' 6"  (1.676 m)   Wt 68.9 kg   SpO2 100%   BMI 24.53 kg/m   PROVIDERS: Gordan Payment., MD is PCP   Joanie Coddington, MD is Vascular Surgeon   Primary Cardiologist: Gypsy Balsam, MD   LABS: Labs reviewed: Acceptable for surgery. (all labs ordered are listed, but only abnormal results are displayed)  Labs Reviewed  BASIC METABOLIC PANEL - Abnormal; Notable for the following components:      Result Value   Glucose, Bld 120 (*)    BUN 47 (*)    Creatinine, Ser 1.72 (*)    GFR, Estimated 31 (*)    All other components within normal limits  CBC - Abnormal; Notable for the following components:   RBC 3.27 (*)    Hemoglobin 10.7 (*)    HCT 33.9 (*)    MCV 103.7 (*)    All other components within normal limits  SURGICAL PCR SCREEN  TYPE AND SCREEN     IMAGES: Carotid Dopper  11/12/2022 Conclusion  Right 60-79% stenosis of the internal carotid artery. This is a hemodynamically significant stenosis. The vertebral artery flow is antegrade.   Left  60-79% stenosis of the internal carotid artery. This is a hemodynamically significant stenosis. The vertebral artery flow is antegrade.   EKG:   CV: Echo 11/21/21  1. GLS -11.7. Left ventricular ejection fraction, by estimation, is 55 to  60%. The left ventricle has normal function. The left ventricle has no  regional wall motion abnormalities. Left ventricular diastolic parameters  are  consistent with Grade I  diastolic dysfunction (impaired relaxation).   2. Right ventricular systolic function is normal. The right ventricular  size is normal. There is normal pulmonary artery systolic pressure.   3. The mitral valve is normal in structure. Mild mitral valve  regurgitation. No evidence of mitral stenosis.   4. The aortic valve is normal in structure. Aortic valve regurgitation is  not visualized. No aortic stenosis is present.   5. The inferior vena cava is normal in size with greater than 50%  respiratory variability, suggesting right atrial pressure of 3 mmHg.    Cardiac Cath 09/11/2021 Conclusions: Mild plaquing of LMCA and mid LAD.  No significant atherosclerotic coronary artery disease identified to explain the patient's NSTEMI and cardiomyopathy. Normal left ventricular filling pressure (LVEDP 13 mmHg). Recommendations: Continue medical therapy for MI with nonobstructive coronary arteries (MINOCA). Escalate goal-directed medical therapy for nonischemic cardiomyopathy, as tolerated.   Myocardial Perfusion 08/15/2021   Findings are consistent with no  ischemia and no prior myocardial infarction. The study is low risk.   No ST deviation was noted.   Left ventricular function is normal. Nuclear stress EF: 78 %. The left ventricular ejection fraction is hyperdynamic (>65%). End diastolic cavity size is normal.   Prior study available for comparison from 06/17/2020. Past Medical History:  Diagnosis Date   Anemia    Anxiety    Arthritis    Carotid stenosis, bilateral    CVA (cerebral vascular accident) (HCC) 2019   Diabetes mellitus type 2 in nonobese (HCC)    Hyperlipidemia    Hypertension    PCP GAVE FOR HER  BLOOD PRESSURE   IBS (irritable bowel syndrome)    Memory deficit    PUD (peptic ulcer disease)    TIA (transient ischemic attack) 2006    Past Surgical History:  Procedure Laterality Date   ABDOMINAL HYSTERECTOMY     BIOPSY THYROID  04/2014   COLON  SURGERY     LAPAROSCOPY N/A 08/25/2015   Procedure: EXPLORATORY LAPAROTOMY, ILEOCECECTOMY, PRIMARY ANASTOMOSIS, LYSIS OF ADHESIONS;  Surgeon: Chevis Pretty III, MD;  Location: [facility];  Service: General;  Laterality: N/A;   laparoscopy for fertility work up     LEFT HEART CATH AND CORONARY ANGIOGRAPHY N/A 09/11/2021   Procedure: LEFT HEART CATH AND CORONARY ANGIOGRAPHY;  Surgeon: Yvonne Kendall, MD;  Location: [facility];  Service: Cardiovascular;  Laterality: N/A;   LOOP RECORDER IMPLANT N/A 03/24/2014   Procedure: LOOP RECORDER IMPLANT;  Surgeon: Duke Salvia, MD;  Location: [facility] CATH LAB;  Service: Cardiovascular;  Laterality: N/A;   LUMBAR LAMINECTOMY/DECOMPRESSION MICRODISCECTOMY  04/25/2011   Procedure: LUMBAR LAMINECTOMY/DECOMPRESSION MICRODISCECTOMY;  Surgeon: Karn Cassis, MD;  Location: [facility];  Service: Neurosurgery;  Laterality: Right;  Right Lumbar Four-Five Discectomy   OOPHORECTOMY     TONSILLECTOMY     TOTAL HIP ARTHROPLASTY Left 09/06/2021   Procedure: TOTAL HIP ARTHROPLASTY ANTERIOR APPROACH;  Surgeon: Ollen Gross, MD;  Location: [facility];  Service: Orthopedics;  Laterality: Left;    MEDICATIONS:  acetaminophen (TYLENOL) 650 MG CR tablet   amLODipine (NORVASC) 5 MG tablet   apixaban (ELIQUIS) 5 MG TABS tablet   azelastine (ASTELIN) 0.1 % nasal spray   furosemide (LASIX) 40 MG tablet   isosorbide mononitrate (IMDUR) 60 MG 24 hr tablet   losartan (COZAAR) 25 MG tablet   losartan (COZAAR) 50 MG tablet   Magnesium 250 MG TABS   nitroGLYCERIN (NITROSTAT) 0.4 MG SL tablet   propranolol (INDERAL) 10 MG tablet   rosuvastatin (CRESTOR) 20 MG tablet   trolamine salicylate (ASPERCREME) 10 % cream   No current facility-administered medications for this encounter.    Jodell Cipro Ward, PA-C [facility] [phone]

## 2022-12-04 NOTE — Anesthesia Preprocedure Evaluation (Addendum)
Anesthesia Evaluation  Patient identified by MRN, date of birth, ID band Patient awake    Reviewed: Allergy & Precautions, NPO status , Patient's Chart, lab work & pertinent test results, reviewed documented beta blocker date and time   History of Anesthesia Complications Negative for: history of anesthetic complications  Airway Mallampati: II       Dental no notable dental hx.    Pulmonary neg sleep apnea, neg COPD, former smoker, neg PE   Pulmonary exam normal        Cardiovascular hypertension, (-) angina + CAD, + Past MI and + Peripheral Vascular Disease  (-) Cardiac Stents, (-) CABG, (-) CHF and (-) DVT  Rhythm:Regular Rate:Normal     Neuro/Psych neg Headaches, neg Seizures PSYCHIATRIC DISORDERS Anxiety    Dementia TIACVA, No Residual Symptoms    GI/Hepatic PUD,GERD  ,,(+) neg Cirrhosis        Endo/Other  diabetes, Type 2    Renal/GU Renal disease     Musculoskeletal  (+) Arthritis ,    Abdominal   Peds  Hematology  (+) Blood dyscrasia, anemia Off eliquis 4d   Anesthesia Other Findings   Reproductive/Obstetrics                              Anesthesia Physical Anesthesia Plan  ASA: 3  Anesthesia Plan: Spinal   Post-op Pain Management:    Induction: Intravenous  PONV Risk Score and Plan: 2 and Propofol infusion and Ondansetron  Airway Management Planned:   Additional Equipment:   Intra-op Plan:   Post-operative Plan: Extubation in OR  Informed Consent: I have reviewed the patients History and Physical, chart, labs and discussed the procedure including the risks, benefits and alternatives for the proposed anesthesia with the patient or authorized representative who has indicated his/her understanding and acceptance.     Dental advisory given  Plan Discussed with:   Anesthesia Plan Comments: (See PAT note 11/28/2022)        Anesthesia Quick Evaluation

## 2022-12-04 NOTE — Progress Notes (Signed)
Anesthesia Chart Review   Case: [id number] Date/Time: 12/10/22 1345   Procedure: TOTAL HIP ARTHROPLASTY ANTERIOR APPROACH (Right: Hip)   Anesthesia type: Choice   Pre-op diagnosis: right hip osteoarthritis   Location[facility] 09 [facility]   Surgeons: Ollen Gross, MD       DISCUSSION:77 y.o. former smoker with h/o HTN, CVA, atrial fibrillation, asymptomatic bradycardia, CAD (NSTEMI 2023, no stenting), CKD Stage III, DM II, right hip OA scheduled for above procedure 11/05/2022 with Dr. Ollen Gross.    Case previously cancelled due to need for carotid doppler. Carotid studies 11/12/2022 with bilateral stenosis of 60-79%.  Seen by vascular surgery 11/12/2022. Per notes no intervention since pt is asymptomatic.  She will have a repeat carotid study in 1 year.    Per previous anesthesia note,   "Per cardiology preoperative evaluation 10/02/22, "Chart reviewed as part of pre-operative protocol coverage. Given past medical history and time since last visit, based on ACC/AHA guidelines, Tiffany Wiggins is at acceptable risk for the planned procedure without further cardiovascular testing.  Pt is at elevated CV risk off of anticoagulation, however has renal dysfunction and memory loss so periprocedural bridging isn't ideal either.   Recommend patient hold Eliquis for 3 days prior to procedure and resume as soon as safely possible after given elevated CV Risk "   Pt seen by PCP 10/16/2022 for preoperative evaluation.  Per OV note, "This patient unfortunately is going to be moderately high to high risk for surgical complications. She is not optimized right now and that she needs to get a carotid Doppler to look at this carotid stenosis. If she is greater than 80% I would recommend she get that repaired prior to doing hip surgery. If the carotid Doppler comes back showing stability and her hemoglobin has improved with iron levels improved then she would be considered optimized for surgery, though she would remain  at moderately high to high risk for surgical complications. She would need to be off of her Eliquis for at least 2 days and resume that as soon as possible after surgery. She is at risk of atrial fibrillation during the surgery and has a CHADS2 score of 9. They understand the risk of atrial fibrillation and potential stroke but would like to proceed. Coronary disease appears stable. Hypertension is mildly elevated but stable. Diabetes is well-controlled.""   VS: BP 133/65 Comment: right arm sitting  Temp 36.7 C (Oral)   Resp 20   Ht 5\' 6"  (1.676 m)   Wt 68.9 kg   SpO2 100%   BMI 24.53 kg/m   PROVIDERS: Gordan Payment., MD is PCP   Joanie Coddington, MD is Vascular Surgeon   Primary Cardiologist: Gypsy Balsam, MD   LABS: Labs reviewed: Acceptable for surgery. (all labs ordered are listed, but only abnormal results are displayed)  Labs Reviewed  BASIC METABOLIC PANEL - Abnormal; Notable for the following components:      Result Value   Glucose, Bld 120 (*)    BUN 47 (*)    Creatinine, Ser 1.72 (*)    GFR, Estimated 31 (*)    All other components within normal limits  CBC - Abnormal; Notable for the following components:   RBC 3.27 (*)    Hemoglobin 10.7 (*)    HCT 33.9 (*)    MCV 103.7 (*)    All other components within normal limits  SURGICAL PCR SCREEN  TYPE AND SCREEN     IMAGES: Carotid Dopper  11/12/2022 Conclusion  Right 60-79% stenosis of the internal carotid artery. This is a hemodynamically significant stenosis. The vertebral artery flow is antegrade.   Left  60-79% stenosis of the internal carotid artery. This is a hemodynamically significant stenosis. The vertebral artery flow is antegrade.   EKG:   CV: Echo 11/21/21  1. GLS -11.7. Left ventricular ejection fraction, by estimation, is 55 to  60%. The left ventricle has normal function. The left ventricle has no  regional wall motion abnormalities. Left ventricular diastolic parameters  are  consistent with Grade I  diastolic dysfunction (impaired relaxation).   2. Right ventricular systolic function is normal. The right ventricular  size is normal. There is normal pulmonary artery systolic pressure.   3. The mitral valve is normal in structure. Mild mitral valve  regurgitation. No evidence of mitral stenosis.   4. The aortic valve is normal in structure. Aortic valve regurgitation is  not visualized. No aortic stenosis is present.   5. The inferior vena cava is normal in size with greater than 50%  respiratory variability, suggesting right atrial pressure of 3 mmHg.    Cardiac Cath 09/11/2021 Conclusions: Mild plaquing of LMCA and mid LAD.  No significant atherosclerotic coronary artery disease identified to explain the patient's NSTEMI and cardiomyopathy. Normal left ventricular filling pressure (LVEDP 13 mmHg). Recommendations: Continue medical therapy for MI with nonobstructive coronary arteries (MINOCA). Escalate goal-directed medical therapy for nonischemic cardiomyopathy, as tolerated.   Myocardial Perfusion 08/15/2021   Findings are consistent with no  ischemia and no prior myocardial infarction. The study is low risk.   No ST deviation was noted.   Left ventricular function is normal. Nuclear stress EF: 78 %. The left ventricular ejection fraction is hyperdynamic (>65%). End diastolic cavity size is normal.   Prior study available for comparison from 06/17/2020. Past Medical History:  Diagnosis Date   Anemia    Anxiety    Arthritis    Carotid stenosis, bilateral    CVA (cerebral vascular accident) (HCC) 2019   Diabetes mellitus type 2 in nonobese (HCC)    Hyperlipidemia    Hypertension    PCP GAVE FOR HER  BLOOD PRESSURE   IBS (irritable bowel syndrome)    Memory deficit    PUD (peptic ulcer disease)    TIA (transient ischemic attack) 2006    Past Surgical History:  Procedure Laterality Date   ABDOMINAL HYSTERECTOMY     BIOPSY THYROID  04/2014   COLON  SURGERY     LAPAROSCOPY N/A 08/25/2015   Procedure: EXPLORATORY LAPAROTOMY, ILEOCECECTOMY, PRIMARY ANASTOMOSIS, LYSIS OF ADHESIONS;  Surgeon: Chevis Pretty III, MD;  Location: [facility];  Service: General;  Laterality: N/A;   laparoscopy for fertility work up     LEFT HEART CATH AND CORONARY ANGIOGRAPHY N/A 09/11/2021   Procedure: LEFT HEART CATH AND CORONARY ANGIOGRAPHY;  Surgeon: Yvonne Kendall, MD;  [facility];  Service: Cardiovascular;  Laterality: N/A;   LOOP RECORDER IMPLANT N/A 03/24/2014   Procedure: LOOP RECORDER IMPLANT;  Surgeon: Duke Salvia, MD;  Location: [facility] CATH LAB;  Service: Cardiovascular;  Laterality: N/A;   LUMBAR LAMINECTOMY/DECOMPRESSION MICRODISCECTOMY  04/25/2011   Procedure: LUMBAR LAMINECTOMY/DECOMPRESSION MICRODISCECTOMY;  Surgeon: Karn Cassis, MD;  Location: [facility];  Service: Neurosurgery;  Laterality: Right;  Right Lumbar Four-Five Discectomy   OOPHORECTOMY     TONSILLECTOMY     TOTAL HIP ARTHROPLASTY Left 09/06/2021   Procedure: TOTAL HIP ARTHROPLASTY ANTERIOR APPROACH;  Surgeon: Ollen Gross, MD;  Location: [facility];  Service: Orthopedics;  Laterality: Left;    MEDICATIONS:  acetaminophen (TYLENOL) 650 MG CR tablet   amLODipine (NORVASC) 5 MG tablet   apixaban (ELIQUIS) 5 MG TABS tablet   azelastine (ASTELIN) 0.1 % nasal spray   furosemide (LASIX) 40 MG tablet   isosorbide mononitrate (IMDUR) 60 MG 24 hr tablet   losartan (COZAAR) 25 MG tablet   losartan (COZAAR) 50 MG tablet   Magnesium 250 MG TABS   nitroGLYCERIN (NITROSTAT) 0.4 MG SL tablet   propranolol (INDERAL) 10 MG tablet   rosuvastatin (CRESTOR) 20 MG tablet   trolamine salicylate (ASPERCREME) 10 % cream   No current facility-administered medications for this encounter.    Jodell Cipro Ward, PA-C [facility] [phone]

## 2022-12-10 ENCOUNTER — Observation Stay (HOSPITAL_COMMUNITY)
Admission: RE | Admit: 2022-12-10 | Discharge: 2022-12-12 | Disposition: A | Discharge status: Home / Self Care | Payer: Medicare Other | Attending: Orthopedic Surgery | Admitting: Orthopedic Surgery

## 2022-12-10 ENCOUNTER — Ambulatory Visit (HOSPITAL_COMMUNITY): Payer: Medicare Other | Admitting: Anesthesiology

## 2022-12-10 ENCOUNTER — Encounter (HOSPITAL_COMMUNITY): Payer: Self-pay | Admitting: Orthopedic Surgery

## 2022-12-10 ENCOUNTER — Other Ambulatory Visit: Payer: Self-pay

## 2022-12-10 ENCOUNTER — Encounter (HOSPITAL_COMMUNITY)
Admission: RE | Disposition: A | Discharge status: Home / Self Care | Payer: Self-pay | Source: Home / Self Care | Attending: Orthopedic Surgery

## 2022-12-10 ENCOUNTER — Ambulatory Visit (HOSPITAL_COMMUNITY): Payer: Medicare Other

## 2022-12-10 ENCOUNTER — Ambulatory Visit (HOSPITAL_COMMUNITY): Payer: Medicare Other | Admitting: Physician Assistant

## 2022-12-10 DIAGNOSIS — Z87891 Personal history of nicotine dependence: Secondary | ICD-10-CM | POA: Insufficient documentation

## 2022-12-10 DIAGNOSIS — I129 Hypertensive chronic kidney disease with stage 1 through stage 4 chronic kidney disease, or unspecified chronic kidney disease: Secondary | ICD-10-CM | POA: Insufficient documentation

## 2022-12-10 DIAGNOSIS — Z96642 Presence of left artificial hip joint: Principal | ICD-10-CM

## 2022-12-10 DIAGNOSIS — E785 Hyperlipidemia, unspecified: Secondary | ICD-10-CM

## 2022-12-10 DIAGNOSIS — I480 Paroxysmal atrial fibrillation: Secondary | ICD-10-CM

## 2022-12-10 DIAGNOSIS — M1611 Unilateral primary osteoarthritis, right hip: Principal | ICD-10-CM | POA: Insufficient documentation

## 2022-12-10 DIAGNOSIS — Z79899 Other long term (current) drug therapy: Secondary | ICD-10-CM | POA: Insufficient documentation

## 2022-12-10 DIAGNOSIS — E1122 Type 2 diabetes mellitus with diabetic chronic kidney disease: Secondary | ICD-10-CM | POA: Diagnosis not present

## 2022-12-10 DIAGNOSIS — Z96652 Presence of left artificial knee joint: Secondary | ICD-10-CM | POA: Insufficient documentation

## 2022-12-10 DIAGNOSIS — I2510 Atherosclerotic heart disease of native coronary artery without angina pectoris: Secondary | ICD-10-CM | POA: Insufficient documentation

## 2022-12-10 DIAGNOSIS — I4891 Unspecified atrial fibrillation: Secondary | ICD-10-CM | POA: Insufficient documentation

## 2022-12-10 DIAGNOSIS — N183 Chronic kidney disease, stage 3 unspecified: Secondary | ICD-10-CM | POA: Diagnosis not present

## 2022-12-10 DIAGNOSIS — Z7901 Long term (current) use of anticoagulants: Secondary | ICD-10-CM | POA: Insufficient documentation

## 2022-12-10 DIAGNOSIS — M1711 Unilateral primary osteoarthritis, right knee: Secondary | ICD-10-CM | POA: Diagnosis not present

## 2022-12-10 DIAGNOSIS — M1610 Unilateral primary osteoarthritis, unspecified hip: Principal | ICD-10-CM | POA: Diagnosis present

## 2022-12-10 HISTORY — PX: TOTAL HIP ARTHROPLASTY: SHX124

## 2022-12-10 SURGERY — ARTHROPLASTY, HIP, TOTAL, ANTERIOR APPROACH
Anesthesia: Spinal | Site: Hip | Laterality: Right

## 2022-12-10 MED ORDER — PHENYLEPHRINE 80 MCG/ML (10ML) SYRINGE FOR IV PUSH (FOR BLOOD PRESSURE SUPPORT)
PREFILLED_SYRINGE | INTRAVENOUS | Status: AC
  Filled 2022-12-10: qty 10

## 2022-12-10 MED ORDER — GLYCOPYRROLATE 0.2 MG/ML IJ SOLN
INTRAMUSCULAR | Status: DC | PRN
Start: 2022-12-10 — End: 2022-12-10
  Administered 2022-12-10 (×2): .1 mg via INTRAVENOUS

## 2022-12-10 MED ORDER — AMLODIPINE BESYLATE 5 MG PO TABS
5.0000 mg | ORAL_TABLET | Freq: Every day | ORAL | Status: DC
  Administered 2022-12-11 – 2022-12-12 (×2): 5 mg via ORAL
  Filled 2022-12-10 (×2): qty 1

## 2022-12-10 MED ORDER — ONDANSETRON HCL 4 MG PO TABS: 4.0000 mg | ORAL_TABLET | Freq: Four times a day (QID) | ORAL | Status: DC | PRN

## 2022-12-10 MED ORDER — ACETAMINOPHEN 325 MG PO TABS
325.0000 mg | ORAL_TABLET | Freq: Four times a day (QID) | ORAL | Status: DC | PRN
  Administered 2022-12-11 – 2022-12-12 (×5): 650 mg via ORAL
  Filled 2022-12-10 (×5): qty 2

## 2022-12-10 MED ORDER — DEXAMETHASONE SODIUM PHOSPHATE 10 MG/ML IJ SOLN
INTRAMUSCULAR | Status: AC
  Filled 2022-12-10: qty 1

## 2022-12-10 MED ORDER — ROSUVASTATIN CALCIUM 20 MG PO TABS
20.0000 mg | ORAL_TABLET | Freq: Every evening | ORAL | Status: DC
  Administered 2022-12-11: 20 mg via ORAL
  Filled 2022-12-10: qty 1

## 2022-12-10 MED ORDER — ORAL CARE MOUTH RINSE: 15.0000 mL | Freq: Once | OROMUCOSAL | Status: AC

## 2022-12-10 MED ORDER — DEXAMETHASONE SODIUM PHOSPHATE 10 MG/ML IJ SOLN
10.0000 mg | Freq: Once | INTRAMUSCULAR | Status: AC
  Administered 2022-12-11: 10 mg via INTRAVENOUS
  Filled 2022-12-10: qty 1

## 2022-12-10 MED ORDER — OXYCODONE HCL 5 MG PO TABS
ORAL_TABLET | ORAL | Status: AC
  Administered 2022-12-10: 5 mg
  Filled 2022-12-10: qty 1

## 2022-12-10 MED ORDER — PHENOL 1.4 % MT LIQD: 1.0000 | OROMUCOSAL | Status: DC | PRN

## 2022-12-10 MED ORDER — FENTANYL CITRATE (PF) 100 MCG/2ML IJ SOLN
INTRAMUSCULAR | Status: DC | PRN
Start: 2022-12-10 — End: 2022-12-10
  Administered 2022-12-10: 25 ug via INTRAVENOUS

## 2022-12-10 MED ORDER — CHLORHEXIDINE GLUCONATE 0.12 % MT SOLN
15.0000 mL | Freq: Once | OROMUCOSAL | Status: AC
  Administered 2022-12-10: 15 mL via OROMUCOSAL

## 2022-12-10 MED ORDER — MAGNESIUM CITRATE PO SOLN: 1.0000 | Freq: Once | ORAL | Status: DC | PRN

## 2022-12-10 MED ORDER — CEFAZOLIN SODIUM-DEXTROSE 2-4 GM/100ML-% IV SOLN
2.0000 g | Freq: Two times a day (BID) | INTRAVENOUS | Status: AC
  Administered 2022-12-11: 2 g via INTRAVENOUS
  Filled 2022-12-10: qty 100

## 2022-12-10 MED ORDER — TRAMADOL HCL 50 MG PO TABS
50.0000 mg | ORAL_TABLET | Freq: Four times a day (QID) | ORAL | Status: DC | PRN
  Administered 2022-12-10 – 2022-12-11 (×4): 50 mg via ORAL
  Filled 2022-12-10 (×4): qty 1

## 2022-12-10 MED ORDER — OXYCODONE HCL 5 MG PO TABS
ORAL_TABLET | ORAL | Status: AC
  Filled 2022-12-10: qty 1

## 2022-12-10 MED ORDER — METOCLOPRAMIDE HCL 5 MG/ML IJ SOLN: 5.0000 mg | Freq: Three times a day (TID) | INTRAMUSCULAR | Status: DC | PRN

## 2022-12-10 MED ORDER — PHENYLEPHRINE HCL-NACL 20-0.9 MG/250ML-% IV SOLN
INTRAVENOUS | Status: DC | PRN
  Administered 2022-12-10: 80 ug via INTRAVENOUS
  Administered 2022-12-10: 20 ug/min via INTRAVENOUS

## 2022-12-10 MED ORDER — PROPOFOL 1000 MG/100ML IV EMUL
INTRAVENOUS | Status: AC
  Filled 2022-12-10: qty 100

## 2022-12-10 MED ORDER — ONDANSETRON HCL 4 MG/2ML IJ SOLN
4.0000 mg | Freq: Four times a day (QID) | INTRAMUSCULAR | Status: DC | PRN
  Administered 2022-12-11: 4 mg via INTRAVENOUS
  Filled 2022-12-10: qty 2

## 2022-12-10 MED ORDER — TRANEXAMIC ACID 1000 MG/10ML IV SOLN
2000.0000 mg | Freq: Once | INTRAVENOUS | Status: DC
  Filled 2022-12-10: qty 20

## 2022-12-10 MED ORDER — ACETAMINOPHEN 10 MG/ML IV SOLN
1000.0000 mg | Freq: Four times a day (QID) | INTRAVENOUS | Status: DC
  Administered 2022-12-10: 1000 mg via INTRAVENOUS
  Filled 2022-12-10: qty 100

## 2022-12-10 MED ORDER — BUPIVACAINE-EPINEPHRINE (PF) 0.25% -1:200000 IJ SOLN
INTRAMUSCULAR | Status: DC | PRN
  Administered 2022-12-10: 30 mL

## 2022-12-10 MED ORDER — ONDANSETRON HCL 4 MG/2ML IJ SOLN: 4.0000 mg | Freq: Once | INTRAMUSCULAR | Status: DC | PRN

## 2022-12-10 MED ORDER — MENTHOL 3 MG MT LOZG: 1.0000 | LOZENGE | OROMUCOSAL | Status: DC | PRN

## 2022-12-10 MED ORDER — FENTANYL CITRATE PF 50 MCG/ML IJ SOSY
PREFILLED_SYRINGE | INTRAMUSCULAR | Status: AC
  Filled 2022-12-10: qty 1

## 2022-12-10 MED ORDER — METHOCARBAMOL 500 MG IVPB - SIMPLE MED
500.0000 mg | Freq: Four times a day (QID) | INTRAVENOUS | Status: DC | PRN
  Administered 2022-12-10: 500 mg via INTRAVENOUS

## 2022-12-10 MED ORDER — ACETAMINOPHEN 10 MG/ML IV SOLN: 1000.0000 mg | Freq: Once | INTRAVENOUS | Status: DC | PRN

## 2022-12-10 MED ORDER — EPHEDRINE SULFATE (PRESSORS) 50 MG/ML IJ SOLN
INTRAMUSCULAR | Status: DC | PRN
Start: 2022-12-10 — End: 2022-12-10
  Administered 2022-12-10: 10 mg via INTRAVENOUS

## 2022-12-10 MED ORDER — 0.9 % SODIUM CHLORIDE (POUR BTL) OPTIME
TOPICAL | Status: DC | PRN
  Administered 2022-12-10: 1000 mL

## 2022-12-10 MED ORDER — METHOCARBAMOL 500 MG IVPB - SIMPLE MED
INTRAVENOUS | Status: AC
  Filled 2022-12-10: qty 55

## 2022-12-10 MED ORDER — FENTANYL CITRATE (PF) 100 MCG/2ML IJ SOLN
INTRAMUSCULAR | Status: AC
  Filled 2022-12-10: qty 2

## 2022-12-10 MED ORDER — HYDROMORPHONE HCL 1 MG/ML IJ SOLN
0.5000 mg | Freq: Once | INTRAMUSCULAR | Status: AC
  Administered 2022-12-10: 0.5 mg via INTRAVENOUS

## 2022-12-10 MED ORDER — NITROGLYCERIN 0.4 MG SL SUBL: 0.4000 mg | SUBLINGUAL_TABLET | SUBLINGUAL | Status: DC | PRN

## 2022-12-10 MED ORDER — POVIDONE-IODINE 10 % EX SWAB: 2.0000 | Freq: Once | CUTANEOUS | Status: DC

## 2022-12-10 MED ORDER — BUPIVACAINE IN DEXTROSE 0.75-8.25 % IT SOLN
INTRATHECAL | Status: DC | PRN
Start: 2022-12-10 — End: 2022-12-10
  Administered 2022-12-10: 1.8 mL via INTRATHECAL

## 2022-12-10 MED ORDER — ONDANSETRON HCL 4 MG/2ML IJ SOLN
INTRAMUSCULAR | Status: DC | PRN
  Administered 2022-12-10: 4 mg via INTRAVENOUS

## 2022-12-10 MED ORDER — LOSARTAN POTASSIUM 25 MG PO TABS
25.0000 mg | ORAL_TABLET | Freq: Every day | ORAL | Status: DC
  Administered 2022-12-11 – 2022-12-12 (×2): 25 mg via ORAL
  Filled 2022-12-10 (×2): qty 1

## 2022-12-10 MED ORDER — EPHEDRINE 5 MG/ML INJ
INTRAVENOUS | Status: AC
  Filled 2022-12-10: qty 5

## 2022-12-10 MED ORDER — HYDROMORPHONE HCL 1 MG/ML IJ SOLN
INTRAMUSCULAR | Status: AC
  Filled 2022-12-10: qty 1

## 2022-12-10 MED ORDER — LACTATED RINGERS IV SOLN: INTRAVENOUS | Status: DC

## 2022-12-10 MED ORDER — POLYETHYLENE GLYCOL 3350 17 G PO PACK: 17.0000 g | PACK | Freq: Every day | ORAL | Status: DC | PRN

## 2022-12-10 MED ORDER — SODIUM CHLORIDE 0.9 % IV SOLN: INTRAVENOUS | Status: DC

## 2022-12-10 MED ORDER — BUPIVACAINE-EPINEPHRINE 0.25% -1:200000 IJ SOLN
INTRAMUSCULAR | Status: AC
  Filled 2022-12-10: qty 1

## 2022-12-10 MED ORDER — WATER FOR IRRIGATION, STERILE IR SOLN
Status: DC | PRN
  Administered 2022-12-10: 2000 mL

## 2022-12-10 MED ORDER — FUROSEMIDE 40 MG PO TABS
40.0000 mg | ORAL_TABLET | Freq: Every day | ORAL | Status: DC
  Administered 2022-12-11 – 2022-12-12 (×2): 40 mg via ORAL
  Filled 2022-12-10 (×2): qty 1

## 2022-12-10 MED ORDER — PROPOFOL 500 MG/50ML IV EMUL
INTRAVENOUS | Status: DC | PRN
  Administered 2022-12-10: 20 mg via INTRAVENOUS
  Administered 2022-12-10: 30 ug/kg/min via INTRAVENOUS

## 2022-12-10 MED ORDER — APIXABAN 2.5 MG PO TABS
2.5000 mg | ORAL_TABLET | Freq: Two times a day (BID) | ORAL | Status: DC
  Administered 2022-12-11 – 2022-12-12 (×3): 2.5 mg via ORAL
  Filled 2022-12-10 (×3): qty 1

## 2022-12-10 MED ORDER — DOCUSATE SODIUM 100 MG PO CAPS
100.0000 mg | ORAL_CAPSULE | Freq: Two times a day (BID) | ORAL | Status: DC
  Administered 2022-12-10 – 2022-12-12 (×4): 100 mg via ORAL
  Filled 2022-12-10 (×4): qty 1

## 2022-12-10 MED ORDER — ISOSORBIDE MONONITRATE ER 60 MG PO TB24
60.0000 mg | ORAL_TABLET | Freq: Every day | ORAL | Status: DC
  Administered 2022-12-10 – 2022-12-11 (×2): 60 mg via ORAL
  Filled 2022-12-10 (×2): qty 1

## 2022-12-10 MED ORDER — METHOCARBAMOL 500 MG PO TABS
500.0000 mg | ORAL_TABLET | Freq: Four times a day (QID) | ORAL | Status: DC | PRN
  Administered 2022-12-11: 500 mg via ORAL
  Filled 2022-12-10 (×2): qty 1

## 2022-12-10 MED ORDER — OXYCODONE HCL 5 MG/5ML PO SOLN: 5.0000 mg | Freq: Once | ORAL | Status: AC | PRN

## 2022-12-10 MED ORDER — BISACODYL 10 MG RE SUPP: 10.0000 mg | Freq: Every day | RECTAL | Status: DC | PRN

## 2022-12-10 MED ORDER — OXYCODONE HCL 5 MG PO TABS
5.0000 mg | ORAL_TABLET | Freq: Once | ORAL | Status: AC | PRN
  Administered 2022-12-10: 5 mg via ORAL

## 2022-12-10 MED ORDER — ONDANSETRON HCL 4 MG/2ML IJ SOLN
INTRAMUSCULAR | Status: AC
  Filled 2022-12-10: qty 2

## 2022-12-10 MED ORDER — HYDROMORPHONE HCL 1 MG/ML IJ SOLN: 0.5000 mg | Freq: Once | INTRAMUSCULAR | Status: DC

## 2022-12-10 MED ORDER — METOCLOPRAMIDE HCL 5 MG PO TABS: 5.0000 mg | ORAL_TABLET | Freq: Three times a day (TID) | ORAL | Status: DC | PRN

## 2022-12-10 MED ORDER — AZELASTINE HCL 0.1 % NA SOLN: 2.0000 | Freq: Every day | NASAL | Status: DC | PRN

## 2022-12-10 MED ORDER — ROSUVASTATIN CALCIUM 20 MG PO TABS: 20.0000 mg | ORAL_TABLET | Freq: Every evening | ORAL | Status: DC

## 2022-12-10 MED ORDER — CEFAZOLIN SODIUM-DEXTROSE 2-4 GM/100ML-% IV SOLN
2.0000 g | INTRAVENOUS | Status: AC
  Administered 2022-12-10: 2 g via INTRAVENOUS
  Filled 2022-12-10: qty 100

## 2022-12-10 MED ORDER — FENTANYL CITRATE PF 50 MCG/ML IJ SOSY
25.0000 ug | PREFILLED_SYRINGE | INTRAMUSCULAR | Status: DC | PRN
  Administered 2022-12-10 (×3): 50 ug via INTRAVENOUS

## 2022-12-10 MED ORDER — PROPRANOLOL HCL 20 MG PO TABS: 10.0000 mg | ORAL_TABLET | Freq: Every day | ORAL | Status: DC | PRN

## 2022-12-10 MED ORDER — DEXAMETHASONE SODIUM PHOSPHATE 10 MG/ML IJ SOLN
8.0000 mg | Freq: Once | INTRAMUSCULAR | Status: AC
  Administered 2022-12-10: 8 mg via INTRAVENOUS

## 2022-12-10 MED ORDER — CEFAZOLIN SODIUM-DEXTROSE 2-4 GM/100ML-% IV SOLN: 2.0000 g | Freq: Four times a day (QID) | INTRAVENOUS | Status: DC

## 2022-12-10 SURGICAL SUPPLY — 41 items
ADH SKN CLS APL DERMABOND .7 (GAUZE/BANDAGES/DRESSINGS) ×1
BAG COUNTER SPONGE SURGICOUNT (BAG) IMPLANT
BAG SPEC THK2 15X12 ZIP CLS (MISCELLANEOUS)
BAG SPNG CNTER NS LX DISP (BAG)
BAG ZIPLOCK 12X15 (MISCELLANEOUS) IMPLANT
BLADE SAG 18X100X1.27 (BLADE) ×1 IMPLANT
COVER PERINEAL POST (MISCELLANEOUS) ×1 IMPLANT
COVER SURGICAL LIGHT HANDLE (MISCELLANEOUS) ×1 IMPLANT
CUP ACET PINNACLE SECTR 50MM (Hips) IMPLANT
DERMABOND ADVANCED .7 DNX12 (GAUZE/BANDAGES/DRESSINGS) ×1 IMPLANT
DRAPE FOOT SWITCH (DRAPES) ×1 IMPLANT
DRAPE STERI IOBAN 125X83 (DRAPES) ×1 IMPLANT
DRAPE U-SHAPE 47X51 STRL (DRAPES) ×2 IMPLANT
DRSG AQUACEL AG ADV 3.5X10 (GAUZE/BANDAGES/DRESSINGS) ×1 IMPLANT
DURAPREP 26ML APPLICATOR (WOUND CARE) ×1 IMPLANT
ELECT REM PT RETURN 15FT ADLT (MISCELLANEOUS) ×1 IMPLANT
GLOVE BIO SURGEON STRL SZ 6.5 (GLOVE) IMPLANT
GLOVE BIO SURGEON STRL SZ8 (GLOVE) ×1 IMPLANT
GLOVE BIOGEL PI IND STRL 6.5 (GLOVE) IMPLANT
GLOVE BIOGEL PI IND STRL 7.0 (GLOVE) IMPLANT
GLOVE BIOGEL PI IND STRL 8 (GLOVE) ×1 IMPLANT
GOWN STRL REUS W/ TWL LRG LVL3 (GOWN DISPOSABLE) ×1 IMPLANT
GOWN STRL REUS W/TWL LRG LVL3 (GOWN DISPOSABLE) ×1
HEAD FEM STD 32X+5 STRL (Hips) IMPLANT
HOLDER FOLEY CATH W/STRAP (MISCELLANEOUS) ×1 IMPLANT
KIT TURNOVER KIT A (KITS) IMPLANT
LINER MARATHON 32 50 (Hips) IMPLANT
MANIFOLD NEPTUNE II (INSTRUMENTS) ×1 IMPLANT
PACK ANTERIOR HIP CUSTOM (KITS) ×1 IMPLANT
PENCIL SMOKE EVACUATOR COATED (MISCELLANEOUS) ×1 IMPLANT
PINNACLE SECTOR CUP 50MM (Hips) ×1 IMPLANT
SPIKE FLUID TRANSFER (MISCELLANEOUS) ×1 IMPLANT
STEM FEMORAL SZ 5MM STD ACTIS (Stem) IMPLANT
SUT ETHIBOND NAB CT1 #1 30IN (SUTURE) ×1 IMPLANT
SUT MNCRL AB 4-0 PS2 18 (SUTURE) ×1 IMPLANT
SUT STRATAFIX 0 PDS 27 VIOLET (SUTURE) ×1
SUT VIC AB 2-0 CT1 27 (SUTURE) ×2
SUT VIC AB 2-0 CT1 TAPERPNT 27 (SUTURE) ×2 IMPLANT
SUTURE STRATFX 0 PDS 27 VIOLET (SUTURE) ×1 IMPLANT
TRAY FOLEY MTR SLVR 16FR STAT (SET/KITS/TRAYS/PACK) ×1 IMPLANT
TUBE SUCTION HIGH CAP CLEAR NV (SUCTIONS) ×1 IMPLANT

## 2022-12-10 NOTE — Op Note (Signed)
OPERATIVE REPORT- TOTAL HIP ARTHROPLASTY   PREOPERATIVE DIAGNOSIS: Osteoarthritis of the Right hip.   POSTOPERATIVE DIAGNOSIS: Osteoarthritis of the Right  hip.   PROCEDURE: Right total hip arthroplasty, anterior approach.   SURGEON: Ollen Gross, MD   ASSISTANT: Arcola Jansky, PA-C  ANESTHESIA:  Spinal  ESTIMATED BLOOD LOSS:-250 mL    DRAINS: None  COMPLICATIONS: None   CONDITION: PACU - hemodynamically stable.   BRIEF CLINICAL NOTE: Tiffany Wiggins is a 77 y.o. female who has advanced end-  stage arthritis of their Right  hip with progressively worsening pain and  dysfunction.The patient has failed nonoperative management and presents for  total hip arthroplasty.   PROCEDURE IN DETAIL: After successful administration of spinal  anesthetic, the traction boots for the [facility] bed were placed on both  feet and the patient was placed onto the [facility] bed, boots placed into the leg  holders. The Right hip was then isolated from the perineum with plastic  drapes and prepped and draped in the usual sterile fashion. ASIS and  greater trochanter were marked and a oblique incision was made, starting  at about 1 cm lateral and 2 cm distal to the ASIS and coursing towards  the anterior cortex of the femur. The skin was cut with a 10 blade  through subcutaneous tissue to the level of the fascia overlying the  tensor fascia lata muscle. The fascia was then incised in line with the  incision at the junction of the anterior third and posterior 2/3rd. The  muscle was teased off the fascia and then the interval between the TFL  and the rectus was developed. The Hohmann retractor was then placed at  the top of the femoral neck over the capsule. The vessels overlying the  capsule were cauterized and the fat on top of the capsule was removed.  A Hohmann retractor was then placed anterior underneath the rectus  femoris to give exposure to the entire anterior capsule. A T-shaped   capsulotomy was performed. The edges were tagged and the femoral head  was identified.       Osteophytes are removed off the superior acetabulum.  The femoral neck was then cut in situ with an oscillating saw. Traction  was then applied to the left lower extremity utilizing the [facility]  traction. The femoral head was then removed. Retractors were placed  around the acetabulum and then circumferential removal of the labrum was  performed. Osteophytes were also removed. Reaming starts at 47 mm to  medialize and  Increased in 2 mm increments to 49 mm. We reamed in  approximately 40 degrees of abduction, 20 degrees anteversion. A 50 mm  pinnacle acetabular shell was then impacted in anatomic position under  fluoroscopic guidance with excellent purchase. We did not need to place  any additional dome screws. A 32 mm neutral + 4 marathon liner was then  placed into the acetabular shell.       The femoral lift was then placed along the lateral aspect of the femur  just distal to the vastus ridge. The leg was  externally rotated and capsule  was stripped off the inferior aspect of the femoral neck down to the  level of the lesser trochanter, this was done with electrocautery. The femur was lifted after this was performed. The  leg was then placed in an extended and adducted position essentially delivering the femur. We also removed the capsule superiorly and the piriformis from the piriformis fossa to  gain excellent exposure of the  proximal femur. Rongeur was used to remove some cancellous bone to get  into the lateral portion of the proximal femur for placement of the  initial starter reamer. The starter broaches was placed  the starter broach  and was shown to go down the center of the canal. Broaching  with the Actis system was then performed starting at size 0  coursing  Up to size 5. A size 5 had excellent torsional and rotational  and axial stability. The trial standard offset neck was then  placed  with a 32 + 5 trial head. The hip was then reduced. We confirmed that  the stem was in the canal both on AP and lateral x-rays. It also has excellent sizing. The hip was reduced with outstanding stability through full extension and full external rotation.. AP pelvis was taken and the leg lengths were measured and found to be equal. Hip was then dislocated again and the femoral head and neck removed. The  femoral broach was removed. Size 5 Actis stem with a standard offset  neck was then impacted into the femur following native anteversion. Has  excellent purchase in the canal. Excellent torsional and rotational and  axial stability. It is confirmed to be in the canal on AP and lateral  fluoroscopic views. The 32 + 5 metal head was placed and the hip  reduced with outstanding stability. Again AP pelvis was taken and it  confirmed that the leg lengths were equal. The wound was then copiously  irrigated with saline solution and the capsule reattached and repaired  with Ethibond suture. 30 ml of .25% Bupivicaine was  injected into the capsule and into the edge of the tensor fascia lata as well as subcutaneous tissue. The fascia overlying the tensor fascia lata was then closed with a running #1 V-Loc. Subcu was closed with interrupted 2-0 Vicryl and subcuticular running 4-0 Monocryl. Incision was cleaned  and dried. Steri-Strips and a bulky sterile dressing applied. The patient was awakened and transported to  recovery in stable condition.        Please note that a surgical assistant was a medical necessity for this procedure to perform it in a safe and expeditious manner. Assistant was necessary to provide appropriate retraction of vital neurovascular structures and to prevent femoral fracture and allow for anatomic placement of the prosthesis.  Ollen Gross, M.D.

## 2022-12-10 NOTE — Discharge Instructions (Addendum)
Tiffany Gross, MD Total Joint Specialist [facility] [address], [address] [place], [place] [address] [phone]  ANTERIOR APPROACH TOTAL HIP REPLACEMENT POSTOPERATIVE DIRECTIONS     Hip Rehabilitation, Guidelines Following Surgery  The results of a hip operation are greatly improved after range of motion and muscle strengthening exercises. Follow all safety measures which are given to protect your hip. If any of these exercises cause increased pain or swelling in your joint, decrease the amount until you are comfortable again. Then slowly increase the exercises. Call your caregiver if you have problems or questions.   BLOOD CLOT PREVENTION Resume 5 mg Eliquis twice daily upon discharge from the hospital. Do not take any NSAIDs (Advil, Aleve, Ibuprofen, Meloxicam, etc.) while taking Eliquis.     HOME CARE INSTRUCTIONS  Remove items at home which could result in a fall. This includes throw rugs or furniture in walking pathways.  ICE to the affected hip as frequently as 20-30 minutes an hour and then as needed for pain and swelling. Continue to use ice on the hip for pain and swelling from surgery. You may notice swelling that will progress down to the foot and ankle. This is normal after surgery. Elevate the leg when you are not up walking on it.   Continue to use the breathing machine which will help keep your temperature down.  It is common for your temperature to cycle up and down following surgery, especially at night when you are not up moving around and exerting yourself.  The breathing machine keeps your lungs expanded and your temperature down.  DIET You may resume your previous home diet once your are discharged from the hospital.  DRESSING / WOUND CARE / SHOWERING You have an adhesive waterproof bandage over the incision. Leave this in place until your first follow-up appointment. Once you remove this you will not need to place another bandage.  You may  begin showering 3 days following surgery, but do not submerge the incision under water.  ACTIVITY For the first 3-5 days, it is important to rest and keep the operative leg elevated. You should, as a general rule, rest for 50 minutes and walk/stretch for 10 minutes per hour. After 5 days, you may slowly increase activity as tolerated.  Perform the exercises you were provided twice a day for about 15-20 minutes each session. Begin these 2 days following surgery. Walk with your walker as instructed. Use the walker until you are comfortable transitioning to a cane. Walk with the cane in the opposite hand of the operative leg. You may discontinue the cane once you are comfortable and walking steadily. Avoid periods of inactivity such as sitting longer than an hour when not asleep. This helps prevent blood clots.  Do not drive a car for 6 weeks or until released by your surgeon.  Do not drive while taking narcotics.  TED HOSE STOCKINGS Wear the elastic stockings on both legs for three weeks following surgery during the day. You may remove them at night while sleeping.  WEIGHT BEARING Weight bearing as tolerated with assist device (walker, cane, etc) as directed, use it as long as suggested by your surgeon or therapist, typically at least 4-6 weeks.  POSTOPERATIVE CONSTIPATION PROTOCOL Constipation - defined medically as fewer than three stools per week and severe constipation as less than one stool per week.  One of the most common issues patients have following surgery is constipation.  Even if you have a regular bowel pattern at home,  your normal regimen is likely to be disrupted due to multiple reasons following surgery.  Combination of anesthesia, postoperative narcotics, change in appetite and fluid intake all can affect your bowels.  In order to avoid complications following surgery, here are some recommendations in order to help you during your recovery period.  Colace (docusate) - Pick up an  over-the-counter form of Colace or another stool softener and take twice a day as long as you are requiring postoperative pain medications.  Take with a full glass of water daily.  If you experience loose stools or diarrhea, hold the colace until you stool forms back up.  If your symptoms do not get better within 1 week or if they get worse, check with your doctor. Dulcolax (bisacodyl) - Pick up over-the-counter and take as directed by the product packaging as needed to assist with the movement of your bowels.  Take with a full glass of water.  Use this product as needed if not relieved by Colace only.  MiraLax (polyethylene glycol) - Pick up over-the-counter to have on hand.  MiraLax is a solution that will increase the amount of water in your bowels to assist with bowel movements.  Take as directed and can mix with a glass of water, juice, soda, coffee, or tea.  Take if you go more than two days without a movement.Do not use MiraLax more than once per day. Call your doctor if you are still constipated or irregular after using this medication for 7 days in a row.  If you continue to have problems with postoperative constipation, please contact the office for further assistance and recommendations.  If you experience "the worst abdominal pain ever" or develop nausea or vomiting, please contact the office immediatly for further recommendations for treatment.  ITCHING  If you experience itching with your medications, try taking only a single pain pill, or even half a pain pill at a time.  You can also use Benadryl over the counter for itching or also to help with sleep.   MEDICATIONS See your medication summary on the "After Visit Summary" that the nursing staff will review with you prior to discharge.  You may have some home medications which will be placed on hold until you complete the course of blood thinner medication.  It is important for you to complete the blood thinner medication as prescribed by  your surgeon.  Continue your approved medications as instructed at time of discharge.  PRECAUTIONS If you experience chest pain or shortness of breath - call 911 immediately for transfer to the hospital emergency department.  If you develop a fever greater that 101 F, purulent drainage from wound, increased redness or drainage from wound, foul odor from the wound/dressing, or calf pain - CONTACT YOUR SURGEON.                                                   FOLLOW-UP APPOINTMENTS Make sure you keep all of your appointments after your operation with your surgeon and caregivers. You should call the office at the above phone number and make an appointment for approximately two weeks after the date of your surgery or on the date instructed by your surgeon outlined in the "After Visit Summary".  RANGE OF MOTION AND STRENGTHENING EXERCISES  These exercises are designed to help you keep full movement of  your hip joint. Follow your caregiver's or physical therapist's instructions. Perform all exercises about fifteen times, three times per day or as directed. Exercise both hips, even if you have had only one joint replacement. These exercises can be done on a training (exercise) mat, on the floor, on a table or on a bed. Use whatever works the best and is most comfortable for you. Use music or television while you are exercising so that the exercises are a pleasant break in your day. This will make your life better with the exercises acting as a break in routine you can look forward to.  Lying on your back, slowly slide your foot toward your buttocks, raising your knee up off the floor. Then slowly slide your foot back down until your leg is straight again.  Lying on your back spread your legs as far apart as you can without causing discomfort.  Lying on your side, raise your upper leg and foot straight up from the floor as far as is comfortable. Slowly lower the leg and repeat.  Lying on your back, tighten up  the muscle in the front of your thigh (quadriceps muscles). You can do this by keeping your leg straight and trying to raise your heel off the floor. This helps strengthen the largest muscle supporting your knee.  Lying on your back, tighten up the muscles of your buttocks both with the legs straight and with the knee bent at a comfortable angle while keeping your heel on the floor.   POST-OPERATIVE OPIOID TAPER INSTRUCTIONS: It is important to wean off of your opioid medication as soon as possible. If you do not need pain medication after your surgery it is ok to stop day one. Opioids include: Codeine, Hydrocodone(Norco, Vicodin), Oxycodone(Percocet, oxycontin) and hydromorphone amongst others.  Long term and even short term use of opiods can cause: Increased pain response Dependence Constipation Depression Respiratory depression And more.  Withdrawal symptoms can include Flu like symptoms Nausea, vomiting And more Techniques to manage these symptoms Hydrate well Eat regular healthy meals Stay active Use relaxation techniques(deep breathing, meditating, yoga) Do Not substitute Alcohol to help with tapering If you have been on opioids for less than two weeks and do not have pain than it is ok to stop all together.  Plan to wean off of opioids This plan should start within one week post op of your joint replacement. Maintain the same interval or time between taking each dose and first decrease the dose.  Cut the total daily intake of opioids by one tablet each day Next start to increase the time between doses. The last dose that should be eliminated is the evening dose.   IF YOU ARE TRANSFERRED TO A SKILLED REHAB FACILITY If the patient is transferred to a skilled rehab facility following release from the hospital, a list of the current medications will be sent to the facility for the patient to continue.  When discharged from the skilled rehab facility, please have the facility set  up the patient's Home Health Physical Therapy prior to being released. Also, the skilled facility will be responsible for providing the patient with their medications at time of release from the facility to include their pain medication, the muscle relaxants, and their blood thinner medication. If the patient is still at the rehab facility at time of the two week follow up appointment, the skilled rehab facility will also need to assist the patient in arranging follow up appointment in our office and any  transportation needs.  MAKE SURE YOU:  Understand these instructions.  Get help right away if you are not doing well or get worse.    DENTAL ANTIBIOTICS:  In most cases prophylactic antibiotics for Dental procdeures after total joint surgery are not necessary.  Exceptions are as follows:  1. History of prior total joint infection  2. Severely immunocompromised (Organ Transplant, cancer chemotherapy, Rheumatoid biologic meds such as Humera)  3. Poorly controlled diabetes (A1C &gt; 8.0, blood glucose over 200)  If you have one of these conditions, contact your surgeon for an antibiotic prescription, prior to your dental procedure.    Pick up stool softner and laxative for home use following surgery while on pain medications. Do not submerge incision under water. Please use good hand washing techniques while changing dressing each day. May shower starting three days after surgery. Please use a clean towel to pat the incision dry following showers. Continue to use ice for pain and swelling after surgery. Do not use any lotions or creams on the incision until instructed by your surgeon.

## 2022-12-10 NOTE — H&P (Signed)
TOTAL HIP ADMISSION H&P  Patient is admitted for right total hip arthroplasty.  Subjective:  Chief Complaint: Right hip pain  HPI: Tiffany Wiggins, 77 y.o. female, has a history of pain and functional disability in the right hip due to arthritis and patient has failed non-surgical conservative treatments for greater than 12 weeks to include use of assistive devices and activity modification. Onset of symptoms was gradual, starting 5 years ago with gradually worsening course since that time. The patient noted no past surgery on the right hip. Patient currently rates pain in the right hip at 8 out of 10 with activity. Patient has worsening of pain with activity and weight bearing, pain that interfers with activities of daily living, and pain with passive range of motion. Patient has evidence of periarticular osteophytes and joint space narrowing by imaging studies. There is no current active infection.  Patient Active Problem List   Diagnosis Date Noted   AKI (acute kidney injury) (HCC) 09/10/2021   Acute systolic heart failure (HCC) 09/10/2021   S/P total left hip arthroplasty 09/09/2021   Primary osteoarthritis of left hip 09/06/2021   Bradycardia 05/23/2021   History of loop recorder 12/31/2019   CAD (coronary artery disease) 12/10/2018   NSTEMI (non-ST elevated myocardial infarction) (HCC) 12/08/2018   Paroxysmal A-fib (HCC)    Senile purpura (HCC) 12/17/2017   Chronic pain of both shoulders 10/01/2017   Moderate late onset Alzheimer's dementia (HCC) 09/16/2017   Stage 3a chronic kidney disease (HCC) 05/29/2017   Vitamin B12 deficiency 03/20/2017   GERD without esophagitis 01/24/2017   Degenerative lumbar disc 01/24/2017   Bilateral carotid bruits 01/24/2017   Atherosclerosis of both carotid arteries 01/24/2017   History of CVA (cerebrovascular accident) 01/24/2017   High risk medication use 01/24/2017   Osteoarthritis of multiple joints 01/24/2017   Vitamin D deficiency 01/24/2017    Malaise and fatigue 01/24/2017   Pain of left hip joint 01/14/2017   Unilateral primary osteoarthritis, left hip 01/14/2017   Cough 02/15/2016   Wheezing 02/15/2016   Perforation of cecum s/p R hemicolectomy with anastamosis (08/25/2015) 08/26/2015   Preop cardiovascular exam 07/27/2015   Thoracic back pain 04/25/2015   Chronic venous insufficiency 12/21/2014   Hip arthritis 08/10/2014   Breast pain, right 07/22/2014   Thyroid nodule 05/01/2014   PALPITATIONS, CHRONIC 12/08/2009   HIP PAIN, BILATERAL 09/15/2009   OVERWEIGHT 03/08/2008   Cerebral artery occlusion with cerebral infarction (HCC) 03/08/2008   Hyperlipemia 01/30/2007   Essential hypertension 01/30/2007   Diabetes mellitus type 2, controlled, with complications (HCC) 01/30/2007    Past Medical History:  Diagnosis Date   Anemia    Anxiety    Arthritis    Carotid stenosis, bilateral    CVA (cerebral vascular accident) (HCC) 2019   Diabetes mellitus type 2 in nonobese (HCC)    Hyperlipidemia    Hypertension    PCP GAVE FOR HER  BLOOD PRESSURE   IBS (irritable bowel syndrome)    Memory deficit    PUD (peptic ulcer disease)    TIA (transient ischemic attack) 2006    Past Surgical History:  Procedure Laterality Date   ABDOMINAL HYSTERECTOMY     BIOPSY THYROID  04/2014   COLON SURGERY     LAPAROSCOPY N/A 08/25/2015   Procedure: EXPLORATORY LAPAROTOMY, ILEOCECECTOMY, PRIMARY ANASTOMOSIS, LYSIS OF ADHESIONS;  Surgeon: Chevis Pretty III, MD;  Location: [facility];  Service: General;  Laterality: N/A;   laparoscopy for fertility work up     LEFT HEART  CATH AND CORONARY ANGIOGRAPHY N/A 09/11/2021   Procedure: LEFT HEART CATH AND CORONARY ANGIOGRAPHY;  Surgeon: Yvonne Kendall, MD;  Location: [facility] INVASIVE CV LAB;  Service: Cardiovascular;  Laterality: N/A;   LOOP RECORDER IMPLANT N/A 03/24/2014   Procedure: LOOP RECORDER IMPLANT;  Surgeon: Duke Salvia, MD;  Location: [facility] CATH LAB;  Service: Cardiovascular;  Laterality: N/A;    LUMBAR LAMINECTOMY/DECOMPRESSION MICRODISCECTOMY  04/25/2011   Procedure: LUMBAR LAMINECTOMY/DECOMPRESSION MICRODISCECTOMY;  Surgeon: Karn Cassis, MD;  Location: [facility] NEURO ORS;  Service: Neurosurgery;  Laterality: Right;  Right Lumbar Four-Five Discectomy   OOPHORECTOMY     TONSILLECTOMY     TOTAL HIP ARTHROPLASTY Left 09/06/2021   Procedure: TOTAL HIP ARTHROPLASTY ANTERIOR APPROACH;  Surgeon: Ollen Gross, MD;  Location: [facility] OR[facility];  Service: Orthopedics;  Laterality: Left;    Prior to Admission medications   Medication Sig Start Date End Date Taking? Authorizing Provider  acetaminophen (TYLENOL) 650 MG CR tablet Take 650 mg by mouth 2 (two) times daily.   Yes [provider]  amLODipine (NORVASC) 5 MG tablet Take 1 tablet (5 mg total) by mouth daily. Patient needs appointment for further refills. 1 st attempt 11/14/22  Yes Georgeanna Lea, MD  apixaban (ELIQUIS) 5 MG TABS tablet Take 1 tablet (5 mg total) by mouth 2 (two) times daily. 12/10/18  Yes Burnadette Pop, MD  azelastine (ASTELIN) 0.1 % nasal spray Place 2 sprays into both nostrils daily as needed for rhinitis or allergies. 05/10/20  Yes [provider]  furosemide (LASIX) 40 MG tablet Take 1 tablet (40 mg total) by mouth daily. 12/05/21  Yes Meriam Sprague, MD  isosorbide mononitrate (IMDUR) 60 MG 24 hr tablet Take 1 tablet (60 mg total) by mouth daily. 11/30/22  Yes Georgeanna Lea, MD  losartan (COZAAR) 25 MG tablet Take 25 mg by mouth daily. 10/18/22  Yes [provider]  Magnesium 250 MG TABS Take 250 mg by mouth daily.   Yes [provider]  rosuvastatin (CRESTOR) 20 MG tablet Take 1 tablet (20 mg total) by mouth daily. Patient taking differently: Take 20 mg by mouth every evening. 09/13/21 12/10/22 Yes Narda Bonds, MD  trolamine salicylate (ASPERCREME) 10 % cream Apply 1 Application topically daily as needed for muscle pain (Back pain).   Yes [provider]  losartan  (COZAAR) 50 MG tablet Take 1 tablet by mouth once daily Patient not taking: Reported on 11/22/2022 06/19/21   Duke Salvia, MD  nitroGLYCERIN (NITROSTAT) 0.4 MG SL tablet Place 1 tablet (0.4 mg total) under the tongue every 5 (five) minutes as needed for chest pain. 07/06/21   Georgeanna Lea, MD  propranolol (INDERAL) 10 MG tablet Take 10 mg by mouth daily as needed (racing heart).    [provider]    Allergies  Allergen Reactions   Codeine Other (See Comments)    Delirious   Morphine And Codeine Other (See Comments)    Delirious   Ativan [Lorazepam] Other (See Comments)    Delirious and combative   Dilaudid [Hydromorphone] Other (See Comments)    delirious   Statins Other (See Comments)    Social History   Socioeconomic History   Marital status: Married    Spouse name: Not on file   Number of children: 0   Years of education: Not on file   Highest education level: Not on file  Occupational History   Occupation: retired  Tobacco Use   Smoking status: Former  Current packs/day: 0.00    Types: Cigarettes    Quit date: 04/02/2004    Years since quitting: 18.7   Smokeless tobacco: Never  Vaping Use   Vaping status: Never Used  Substance and Sexual Activity   Alcohol use: Yes    Alcohol/week: 2.0 standard drinks of alcohol    Types: 2 Glasses of wine per week   Drug use: No   Sexual activity: Not Currently  Other Topics Concern   Not on file  Social History Narrative   Not on file   Social Determinants of Health   Financial Resource Strain: Not on file  Food Insecurity: Low Risk  (11/12/2022)   Received from [facility]   Hunger Vital Sign    Worried About Running Out of Food in the Last Year: Never true    Ran Out of Food in the Last Year: Never true  Transportation Needs: Not on file (11/12/2022)  Physical Activity: Unknown (08/29/2021)   Received from [facility] visits prior to 06/02/2022.   Exercise Vital Sign  Stress: Not  on file  Social Connections: Not on file  Intimate Partner Violence: Not on file    Tobacco Use: Medium Risk (12/10/2022)   Patient History    Smoking Tobacco Use: Former    Smokeless Tobacco Use: Never    Passive Exposure: Not on file   Social History   Substance and Sexual Activity  Alcohol Use Yes   Alcohol/week: 2.0 standard drinks of alcohol   Types: 2 Glasses of wine per week    Family History  Problem Relation Age of Onset   Cancer Sister        lung   Diabetes Sister    Cancer Brother        lung   Colon cancer Paternal Uncle     ROS   Objective:  Physical Exam: - Well-developed female, no apparent distress.  - Right hip can be flexed to 90 with minimal internal rotation, about 20 external rotation, 20 abduction.  - Significant antalgic gait pattern on the right.  - Very unsteady with her gait.  - Husband is having to help hold her up with ambulation.    Radiographs: AP pelvis, AP and lateral of the right hip dated 05/18/2022 demonstrate a prosthesis on the left in excellent position with no periprosthetic abnormalities. On the right, she has central bone on bone. There is still some joint space left superiorly.  Vital signs in last 24 hours: Temp:  [98.2 F (36.8 C)] 98.2 F (36.8 C) (09/09 1203) Pulse Rate:  [50] 50 (09/09 1203) Resp:  [16] 16 (09/09 1203) BP: (151)/(60) 151/60 (09/09 1203) SpO2:  [97 %] 97 % (09/09 1203) Weight:  [68.9 kg] 68.9 kg (09/09 1207)   Assessment/Plan:  End stage arthritis, right hip  The patient history, physical examination, clinical judgement of the provider and imaging studies are consistent with end stage degenerative joint disease of the right hip and total hip arthroplasty is deemed medically necessary. The treatment options including medical management, injection therapy, arthroscopy and arthroplasty were discussed at length. The risks and benefits of total hip arthroplasty were presented and reviewed. The risks  due to aseptic loosening, infection, stiffness, dislocation/subluxation, thromboembolic complications and other imponderables were discussed. The patient acknowledged the explanation, agreed to proceed with the plan and consent was signed. Patient is being admitted for inpatient treatment for surgery, pain control, PT, OT, prophylactic antibiotics, VTE prophylaxis, progressive ambulation and ADLs and discharge  planning.The patient is planning to be discharged  home with husband .   Patient's anticipated LOS is less than 2 midnights, meeting these requirements: - Younger than 72 - Lives within 1 hour of care - Has a competent adult at home to recover with post-op recover - NO history of  - Chronic pain requiring opiods  - Diabetes  - Coronary Artery Disease  - Heart failure  - Heart attack  - DVT/VTE  - Cardiac arrhythmia  - Respiratory Failure/COPD  - Renal failure  - Anemia  - Advanced Liver disease  Therapy Plans: HEP Disposition: Home with husband Planned DVT Prophylaxis: Eliquis DME Needed: None PCP: Feliciana Rossetti, MD Cardiologist: Gypsy Balsam, MD (clearance received) TXA: Topical (hx TIA 2006) Allergies: codeine, morphine Anesthesia Concerns: Restless while waking per pt BMI: 25.2 Last HgbA1c: 6.3% on 08/15/22 Pharmacy: [facility] ([address], [place])   Other: -Per cardiology clearance letter - patient is at elevated risk off of anti-coagulation leading up to surgery but bridging not recommended due to CKD and dementia. Recommend to stop Eliquis 3 days prior to surgery and restart as soon as possible after surgery -Hgb 12.2 on 10/16/22 (care everywhere in Epic) -NO NARCOTICS. Plan for methocarbamol, gabapentin, tylenol  - Patient was instructed on what medications to stop prior to surgery. - Follow-up visit in 2 weeks with Dr. Lequita Halt - Begin physical therapy following surgery - Pre-operative lab work as pre-surgical testing - Prescriptions will be provided in  hospital at time of discharge  Weston Brass, PA-C Orthopedic Surgery [facility]

## 2022-12-10 NOTE — Anesthesia Postprocedure Evaluation (Signed)
Anesthesia Post Note  Patient: Tiffany Wiggins  Procedure(s) Performed: TOTAL HIP ARTHROPLASTY ANTERIOR APPROACH (Right: Hip)     Patient location during evaluation: PACU Anesthesia Type: Spinal Level of consciousness: awake and alert Pain management: pain level controlled (Difficulty with pain control postoperatively, requiring higher than expected doses of opioid analgesics as well as adjuvant medications. Pain now appears to be at tolerable level. Stable for transfer to floor.) Vital Signs Assessment: post-procedure vital signs reviewed and stable Respiratory status: spontaneous breathing, nonlabored ventilation, respiratory function stable and patient connected to nasal cannula oxygen Cardiovascular status: blood pressure returned to baseline and stable Postop Assessment: no apparent nausea or vomiting Anesthetic complications: no   No notable events documented.  Last Vitals:  Vitals:   12/10/22 1715 12/10/22 1730  BP: (!) 134/108 134/66  Pulse: 79 74  Resp: (!) 26 20  Temp:    SpO2: 100% 100%    Last Pain:  Vitals:   12/10/22 1715  TempSrc:   PainSc: 9                  Mariann Barter

## 2022-12-10 NOTE — Progress Notes (Signed)
PHARMACY NOTE:  ANTIMICROBIAL RENAL DOSAGE ADJUSTMENT  Current antimicrobial regimen includes a mismatch between antimicrobial dosage and estimated renal function.  As per policy approved by the Pharmacy & Therapeutics and Medical Executive Committees, the antimicrobial dosage will be adjusted accordingly.  Current antimicrobial dosage:  ancef 2gm q6h x2   Indication: post-op surgical prophylaxis  Renal Function:  Estimated Creatinine Clearance: 26.5 mL/min (A) (by C-G formula based on SCr of 1.72 mg/dL (H)). []      On intermittent HD, scheduled: []      On CRRT    Antimicrobial dosage has been changed to:  ancef 2gm IV q12h x1   Thank you for allowing pharmacy to be a part of this patient's care.  Lucia Gaskins, [employer] 12/10/2022 6:06 PM

## 2022-12-10 NOTE — Transfer of Care (Signed)
Immediate Anesthesia Transfer of Care Note  Patient: Tiffany Wiggins  Procedure(s) Performed: TOTAL HIP ARTHROPLASTY ANTERIOR APPROACH (Right: Hip)  Patient Location: PACU  Anesthesia Type:Spinal  Level of Consciousness: awake and alert   Airway & Oxygen Therapy: Patient Spontanous Breathing  Post-op Assessment: Report given to RN and Post -op Vital signs reviewed and stable  Post vital signs: Reviewed and stable  Last Vitals:  Vitals Value Taken Time  BP 111/89 12/10/22 1553  Temp    Pulse 81 12/10/22 1554  Resp 19 12/10/22 1554  SpO2 100 % 12/10/22 1554  Vitals shown include unfiled device data.  Last Pain:  Vitals:   12/10/22 1207  TempSrc:   PainSc: 7       Patients Stated Pain Goal: 5 (12/10/22 1207)  Complications: No notable events documented.

## 2022-12-10 NOTE — Interval H&P Note (Signed)
History and Physical Interval Note:  12/10/2022 11:53 AM  Tiffany Wiggins  has presented today for surgery, with the diagnosis of right hip osteoarthritis.  The various methods of treatment have been discussed with the patient and family. After consideration of risks, benefits and other options for treatment, the patient has consented to  Procedure(s): TOTAL HIP ARTHROPLASTY ANTERIOR APPROACH (Right) as a surgical intervention.  The patient's history has been reviewed, patient examined, no change in status, stable for surgery.  I have reviewed the patient's chart and labs.  Questions were answered to the patient's satisfaction.     Homero Fellers [name]

## 2022-12-10 NOTE — Interval H&P Note (Signed)
History and Physical Interval Note:  12/10/2022 12:54 PM  Tiffany Wiggins  has presented today for surgery, with the diagnosis of right hip osteoarthritis.  The various methods of treatment have been discussed with the patient and family. After consideration of risks, benefits and other options for treatment, the patient has consented to  Procedure(s): TOTAL HIP ARTHROPLASTY ANTERIOR APPROACH (Right) as a surgical intervention.  The patient's history has been reviewed, patient examined, no change in status, stable for surgery.  I have reviewed the patient's chart and labs.  Questions were answered to the patient's satisfaction.     Homero Fellers [name]

## 2022-12-10 NOTE — Anesthesia Procedure Notes (Signed)
Spinal  Patient location during procedure: OR Start time: 12/10/2022 2:25 PM End time: 12/10/2022 2:33 PM Reason for block: surgical anesthesia Staffing Performed: anesthesiologist  Anesthesiologist: Mariann Barter, MD Performed by: Mariann Barter, MD Authorized by: Mariann Barter, MD   Preanesthetic Checklist Completed: patient identified, IV checked, site marked, risks and benefits discussed, surgical consent, monitors and equipment checked, pre-op evaluation and timeout performed Spinal Block Patient position: sitting Prep: DuraPrep Patient monitoring: heart rate, cardiac monitor, continuous pulse ox and blood pressure Approach: midline Location: L3-4 Injection technique: single-shot Needle Needle type: Sprotte  Needle gauge: 24 G Needle length: 9 cm Assessment Sensory level: T4 Events: CSF return

## 2022-12-11 ENCOUNTER — Other Ambulatory Visit: Payer: Self-pay | Admitting: Cardiology

## 2022-12-11 ENCOUNTER — Encounter (HOSPITAL_COMMUNITY): Payer: Self-pay | Admitting: Orthopedic Surgery

## 2022-12-11 ENCOUNTER — Other Ambulatory Visit: Payer: Self-pay

## 2022-12-11 DIAGNOSIS — M1611 Unilateral primary osteoarthritis, right hip: Secondary | ICD-10-CM | POA: Diagnosis not present

## 2022-12-11 LAB — CBC
HCT: 29.5 % — ABNORMAL LOW (ref 36.0–46.0)
Hemoglobin: 9.2 g/dL — ABNORMAL LOW (ref 12.0–15.0)
MCH: 32.6 pg (ref 26.0–34.0)
MCHC: 31.2 g/dL (ref 30.0–36.0)
MCV: 104.6 fL — ABNORMAL HIGH (ref 80.0–100.0)
Platelets: 141 10*3/uL — ABNORMAL LOW (ref 150–400)
RBC: 2.82 MIL/uL — ABNORMAL LOW (ref 3.87–5.11)
RDW: 12.3 % (ref 11.5–15.5)
WBC: 11.5 10*3/uL — ABNORMAL HIGH (ref 4.0–10.5)
nRBC: 0 % (ref 0.0–0.2)

## 2022-12-11 LAB — BASIC METABOLIC PANEL
Anion gap: 13 (ref 5–15)
BUN: 36 mg/dL — ABNORMAL HIGH (ref 8–23)
CO2: 22 mmol/L (ref 22–32)
Calcium: 9.1 mg/dL (ref 8.9–10.3)
Chloride: 100 mmol/L (ref 98–111)
Creatinine, Ser: 1.56 mg/dL — ABNORMAL HIGH (ref 0.44–1.00)
GFR, Estimated: 34 mL/min — ABNORMAL LOW (ref >60)
Glucose, Bld: 216 mg/dL — ABNORMAL HIGH (ref 70–99)
Potassium: 3.7 mmol/L (ref 3.5–5.1)
Sodium: 135 mmol/L (ref 135–145)

## 2022-12-11 MED ORDER — METHOCARBAMOL 500 MG PO TABS: 500.0000 mg | ORAL_TABLET | Freq: Four times a day (QID) | ORAL | 0 refills | Status: DC | PRN

## 2022-12-11 MED ORDER — ONDANSETRON HCL 4 MG PO TABS: 4.0000 mg | ORAL_TABLET | Freq: Four times a day (QID) | ORAL | 0 refills | Status: DC | PRN

## 2022-12-11 MED ORDER — TRAMADOL HCL 50 MG PO TABS: 50.0000 mg | ORAL_TABLET | Freq: Four times a day (QID) | ORAL | 0 refills | Status: DC | PRN

## 2022-12-11 NOTE — Plan of Care (Signed)
  Problem: Education: Goal: Knowledge of the prescribed therapeutic regimen will improve Outcome: Progressing   Problem: Pain Management: Goal: Pain level will decrease with appropriate interventions Outcome: Progressing   Problem: Clinical Measurements: Goal: Ability to maintain clinical measurements within normal limits will improve Outcome: Progressing   Problem: Safety: Goal: Ability to remain free from injury will improve Outcome: Progressing   

## 2022-12-11 NOTE — Progress Notes (Signed)
Physical Therapy Treatment Patient Details Name: Tiffany Wiggins MRN: [id number] DOB: Oct 10, 1945 Today's Date: 12/11/2022   History of Present Illness 77 y.o. female admitted 12/10/22 for R AA-THA. PMH: L THA with post op NSTEMI 08/2021, anxiety, CVA, HTN, IBS.    PT Comments  Pt tolerated increased ambulation distance of 62' with RW with frequent manual assistance to keep both hands on the RW as she frequently let go of the RW. Pt is oriented to self only, does not follow 1 step commands consistently, she is not retaining information that is presented to her, per spouse this is baseline. She is not yet ready to DC home from a PT standpoint. Spouse does not feel he can manage her care at this point. Will plan to do stair training tomorrow.      If plan is discharge home, recommend the following: A little help with walking and/or transfers;A little help with bathing/dressing/bathroom;Assistance with cooking/housework;Assist for transportation;Help with stairs or ramp for entrance;Direct supervision/assist for financial management;Direct supervision/assist for medications management   Can travel by private vehicle        Equipment Recommendations  None recommended by PT    Recommendations for Other Services       Precautions / Restrictions Precautions Precautions: Fall Precaution Comments: decreased safety awareness 2* dementia Restrictions Weight Bearing Restrictions: No     Mobility  Bed Mobility Overal bed mobility: Needs Assistance Bed Mobility: Supine to Sit     Supine to sit: Mod assist     General bed mobility comments: assist to raise trunk and pivot hips to EOB    Transfers Overall transfer level: Needs assistance Equipment used: Rolling walker (2 wheels) Transfers: Sit to/from Stand Sit to Stand: Min assist           General transfer comment: min A from elevated bed, verbal cues for hand placement    Ambulation/Gait Ambulation/Gait assistance: Min  assist Gait Distance (Feet): 78 Feet Assistive device: Rolling walker (2 wheels) Gait Pattern/deviations: Step-to pattern Gait velocity: decr     General Gait Details: pt oriented to self only, she did not maintain BUEs on RW despite verbal and manual cues to do so, distance limited by pain and by decreased safety awareness 2* dementia   Stairs             Wheelchair Mobility     Tilt Bed    Modified Rankin (Stroke Patients Only)       Balance Overall balance assessment: Needs assistance Sitting-balance support: Feet supported Sitting balance-Leahy Scale: Fair     Standing balance support: Bilateral upper extremity supported, During functional activity Standing balance-Leahy Scale: Poor                              Cognition Arousal: Alert Behavior During Therapy: Anxious, Impulsive Overall Cognitive Status: History of cognitive impairments - at baseline                                 General Comments: oriented to self only, does not follow 1 step commands        Exercises Total Joint Exercises Ankle Circles/Pumps: AROM, Both, 10 reps, Supine Heel Slides: AAROM, Right, 5 reps, Supine Hip ABduction/ADduction: AAROM, Right, 5 reps, Supine    General Comments        Pertinent Vitals/Pain Pain Assessment Pain Assessment: Faces Faces Pain Scale: Hurts  little more Breathing: normal Negative Vocalization: occasional moan/groan, low speech, negative/disapproving quality Facial Expression: sad, frightened, frown Body Language: tense, distressed pacing, fidgeting Consolability: distracted or reassured by voice/touch PAINAD Score: 4 Pain Location: R hip Pain Descriptors / Indicators: Guarding, Operative site guarding, Grimacing Pain Intervention(s): Limited activity within patient's tolerance, Monitored during session, Ice applied, Repositioned, Premedicated before session    Home Living Family/patient expects to be discharged  to:: Private residence Living Arrangements: Spouse/significant other Available Help at Discharge: Family;Available 24 hours/day Type of Home: House Home Access: Stairs to enter Entrance Stairs-Rails: Left Entrance Stairs-Number of Steps: 3   Home Layout: One level Home Equipment: Agricultural consultant (2 wheels);Cane - single point;Grab bars - tub/shower;Toilet riser      Prior Function            PT Goals (current goals can now be found in the care plan section) Acute Rehab PT Goals Patient Stated Goal: to walk safety PT Goal Formulation: With family Time For Goal Achievement: 12/18/22 Potential to Achieve Goals: Fair Progress towards PT goals: Progressing toward goals    Frequency    7X/week      PT Plan      Co-evaluation              AM-PAC PT "6 Clicks" Mobility   Outcome Measure  Help needed turning from your back to your side while in a flat bed without using bedrails?: A Little Help needed moving from lying on your back to sitting on the side of a flat bed without using bedrails?: A Lot Help needed moving to and from a bed to a chair (including a wheelchair)?: A Little Help needed standing up from a chair using your arms (e.g., wheelchair or bedside chair)?: A Lot Help needed to walk in hospital room?: A Little Help needed climbing 3-5 steps with a railing? : A Lot 6 Click Score: 15    End of Session Equipment Utilized During Treatment: Gait belt Activity Tolerance: Patient limited by pain Patient left: in chair;with chair alarm set;with call bell/phone within reach;with family/visitor present Nurse Communication: Mobility status PT Visit Diagnosis: Difficulty in walking, not elsewhere classified (R26.2);Pain;Unsteadiness on feet (R26.81) Pain - Right/Left: Right Pain - part of body: Hip     Time: 2956-2130 PT Time Calculation (min) (ACUTE ONLY): 24 min  Charges:    $Gait Training: 8-22 mins $Therapeutic Activity: 8-22 mins PT General  Charges $$ ACUTE PT VISIT: 1 Visit                     Tamala Ser PT 12/11/2022  Acute Rehabilitation Services  Office [phone]

## 2022-12-11 NOTE — Progress Notes (Signed)
   Subjective: 1 Day Post-Op Procedure(s) (LRB): TOTAL HIP ARTHROPLASTY ANTERIOR APPROACH (Right) Patient reports pain as mild.   Patient seen in rounds by Dr. Lequita Halt She had an okay night last night, but is confused this AM. This was expected, patient with dementia and had the same issues postoperatively with the left hip. Daughter is present with her this morning. No issues overnight. Foley catheter removed.  Objective: Vital signs in last 24 hours: Temp:  [97.3 F (36.3 C)-98.2 F (36.8 C)] 98.2 F (36.8 C) (09/09 2239) Pulse Rate:  [50-104] 104 (09/10 0632) Resp:  [16-26] 18 (09/10 0632) BP: (110-151)/(48-108) 111/75 (09/10 0632) SpO2:  [92 %-100 %] 92 % (09/10 9528) Weight:  [68.9 kg] 68.9 kg (09/09 1207)  Intake/Output from previous day:  Intake/Output Summary (Last 24 hours) at 12/11/2022 0821 Last data filed at 12/11/2022 0640 Gross per 24 hour  Intake 2667.37 ml  Output 1000 ml  Net 1667.37 ml     Intake/Output this shift: No intake/output data recorded.  Labs: Recent Labs    12/11/22 0254  HGB 9.2*   Recent Labs    12/11/22 0254  WBC 11.5*  RBC 2.82*  HCT 29.5*  PLT 141*   Recent Labs    12/11/22 0254  NA 135  K 3.7  CL 100  CO2 22  BUN 36*  CREATININE 1.56*  GLUCOSE 216*  CALCIUM 9.1   No results for input(s): "LABPT", "INR" in the last 72 hours.  Exam: General - Patient is Alert and Confused Extremity - Neurologically intact Neurovascular intact Sensation intact distally Dorsiflexion/Plantar flexion intact Dressing - dressing C/D/I Motor Function - intact, moving foot and toes well on exam.   Past Medical History:  Diagnosis Date   Anemia    Anxiety    Arthritis    Carotid stenosis, bilateral    CVA (cerebral vascular accident) (HCC) 2019   Diabetes mellitus type 2 in nonobese (HCC)    Hyperlipidemia    Hypertension    PCP GAVE FOR HER  BLOOD PRESSURE   IBS (irritable bowel syndrome)    Memory deficit    PUD (peptic ulcer  disease)    TIA (transient ischemic attack) 2006    Assessment/Plan: 1 Day Post-Op Procedure(s) (LRB): TOTAL HIP ARTHROPLASTY ANTERIOR APPROACH (Right) Principal Problem:   Hip arthritis Active Problems:   Osteoarthritis of right hip  Estimated body mass index is 24.53 kg/m as calculated from the following:   Height as of this encounter: 5\' 6"  (1.676 m).   Weight as of this encounter: 68.9 kg. Up with therapy  DVT Prophylaxis -  Eliquis Weight bearing as tolerated. Begin therapy.  Plan is to go Home after hospital stay. Patient cleared to discharge once safe and her husband is comfortable. She will do much better at home regarding confusion. Will see how she progresses mentally and with physical therapy today.   Arther Abbott, PA-C Orthopedic Surgery [phone] 12/11/2022, 8:21 AM

## 2022-12-11 NOTE — Care Management Obs Status (Signed)
MEDICARE OBSERVATION STATUS NOTIFICATION   Patient Details  Name: Tiffany Wiggins MRN: [id number] Date of Birth: April 02, 1946   Medicare Observation Status Notification Given:  Yes    Ewing Schlein, LCSW 12/11/2022, 2:13 PM

## 2022-12-11 NOTE — Evaluation (Signed)
Physical Therapy Evaluation Patient Details Name: Tiffany Wiggins MRN: [id number] DOB: [birth year] Today's Date: 12/11/2022  History of Present Illness  77 y.o. female admitted 12/10/22 for R AA-THA. PMH: L THA with post op NSTEMI 08/2021, anxiety, CVA, HTN, IBS.  Clinical Impression  Pt is s/p THA resulting in the deficits listed below (see PT Problem List). Pt ambulated 15' + 6' with RW, distance limited by R hip pain and decreased safety awareness, pt did not maintain BUEs on RW despite verbal and manual cues to do so. Spouse reports pt ambulated with SPC at home at baseline. Pt is oriented to self only.  Pt will benefit from acute skilled PT to increase their independence and safety with mobility to facilitate discharge.          If plan is discharge home, recommend the following: A little help with walking and/or transfers;A little help with bathing/dressing/bathroom;Assistance with cooking/housework;Assist for transportation;Help with stairs or ramp for entrance;Direct supervision/assist for financial management;Direct supervision/assist for medications management   Can travel by private vehicle        Equipment Recommendations None recommended by PT  Recommendations for Other Services       Functional Status Assessment Patient has had a recent decline in their functional status and demonstrates the ability to make significant improvements in function in a reasonable and predictable amount of time.     Precautions / Restrictions Precautions Precautions: Fall Precaution Comments: decreased safety awareness 2* dementia Restrictions Weight Bearing Restrictions: No      Mobility  Bed Mobility Overal bed mobility: Needs Assistance Bed Mobility: Supine to Sit     Supine to sit: Mod assist     General bed mobility comments: assist to raise trunk and pivot hips to EOB    Transfers Overall transfer level: Needs assistance Equipment used: Rolling walker (2 wheels) Transfers:  Sit to/from Stand Sit to Stand: Min assist, Mod assist           General transfer comment: min A from elevated bed, mod A from commode    Ambulation/Gait Ambulation/Gait assistance: Min assist Gait Distance (Feet): 15 Feet Assistive device: Rolling walker (2 wheels) Gait Pattern/deviations: Step-to pattern       General Gait Details: pt oriented to self only, she did not maintain BUEs on RW despite verbal and manual cues to do so, distance limited by pain and by decreased safety awareness 2* dementia  Stairs            Wheelchair Mobility     Tilt Bed    Modified Rankin (Stroke Patients Only)       Balance Overall balance assessment: Needs assistance Sitting-balance support: Feet supported Sitting balance-Leahy Scale: Fair     Standing balance support: Bilateral upper extremity supported, During functional activity Standing balance-Leahy Scale: Poor                               Pertinent Vitals/Pain Pain Assessment Pain Assessment: Faces Faces Pain Scale: Hurts even more Breathing: occasional labored breathing, short period of hyperventilation Negative Vocalization: occasional moan/groan, low speech, negative/disapproving quality Facial Expression: sad, frightened, frown Body Language: tense, distressed pacing, fidgeting Consolability: distracted or reassured by voice/touch PAINAD Score: 5 Pain Location: R hip Pain Descriptors / Indicators: Sore, Guarding, Operative site guarding Pain Intervention(s): Limited activity within patient's tolerance, Monitored during session, Premedicated before session, Ice applied    Home Living Family/patient expects to be discharged to:: Private  residence Living Arrangements: Spouse/significant other Available Help at Discharge: Family;Available 24 hours/day Type of Home: House Home Access: Stairs to enter Entrance Stairs-Rails: Left Entrance Stairs-Number of Steps: 3   Home Layout: One level Home  Equipment: Agricultural consultant (2 wheels);Cane - single point;Grab bars - tub/shower;Toilet riser      Prior Function Prior Level of Function : Needs assist             Mobility Comments: was walking with SPC, spouse reports no falls in past 6 months but pt has had some stumbles ADLs Comments: min A for bathing/dressing     Extremity/Trunk Assessment   Upper Extremity Assessment Upper Extremity Assessment: Overall WFL for tasks assessed    Lower Extremity Assessment Lower Extremity Assessment: RLE deficits/detail;Difficult to assess due to impaired cognition RLE Deficits / Details: tolerated R hip AAROM flexion to ~30* and abduction to ~15* RLE: Unable to fully assess due to pain    Cervical / Trunk Assessment Cervical / Trunk Assessment: Normal  Communication   Communication Communication: Difficulty following commands/understanding Following commands: Follows one step commands inconsistently Cueing Techniques: Verbal cues;Gestural cues;Tactile cues;Visual cues  Cognition Arousal: Alert Behavior During Therapy: Anxious, Impulsive Overall Cognitive Status: History of cognitive impairments - at baseline                                 General Comments: oriented to self only, does not follow 1 step commands        General Comments      Exercises Total Joint Exercises Ankle Circles/Pumps: AROM, Both, 10 reps, Supine Heel Slides: AAROM, Right, 5 reps, Supine Hip ABduction/ADduction: AAROM, Right, 5 reps, Supine   Assessment/Plan    PT Assessment Patient needs continued PT services  PT Problem List Decreased activity tolerance;Pain;Decreased balance;Decreased mobility;Decreased strength       PT Treatment Interventions Gait training;Therapeutic exercise;DME instruction;Therapeutic activities;Functional mobility training;Patient/family education;Stair training    PT Goals (Current goals can be found in the Care Plan section)  Acute Rehab PT  Goals Patient Stated Goal: to walk safety PT Goal Formulation: With family Time For Goal Achievement: 12/18/22 Potential to Achieve Goals: Fair    Frequency 7X/week     Co-evaluation               AM-PAC PT "6 Clicks" Mobility  Outcome Measure Help needed turning from your back to your side while in a flat bed without using bedrails?: A Little Help needed moving from lying on your back to sitting on the side of a flat bed without using bedrails?: A Lot Help needed moving to and from a bed to a chair (including a wheelchair)?: A Little Help needed standing up from a chair using your arms (e.g., wheelchair or bedside chair)?: A Lot Help needed to walk in hospital room?: A Little Help needed climbing 3-5 steps with a railing? : A Lot 6 Click Score: 15    End of Session Equipment Utilized During Treatment: Gait belt Activity Tolerance: Patient limited by pain Patient left: in chair;with chair alarm set;with call bell/phone within reach;with family/visitor present Nurse Communication: Mobility status PT Visit Diagnosis: Difficulty in walking, not elsewhere classified (R26.2);Pain;Unsteadiness on feet (R26.81) Pain - Right/Left: Right Pain - part of body: Hip    Time: 8657-8469 PT Time Calculation (min) (ACUTE ONLY): 37 min   Charges:   PT Evaluation $PT Eval Moderate Complexity: 1 Mod PT Treatments $Gait Training: 8-22 mins  PT General Charges $$ ACUTE PT VISIT: 1 Visit        Tamala Ser PT 12/11/2022  Acute Rehabilitation Services  Office [phone]

## 2022-12-11 NOTE — Progress Notes (Signed)
K Edmisten PA notified of patient not passing Physical Therapy. Sharrell Ku RN

## 2022-12-11 NOTE — TOC Transition Note (Signed)
Transition of Care [facility]) - CM/SW Discharge Note  Patient Details  Name: Tiffany Wiggins MRN: [id number] Date of Birth: 1945-10-24  [facility]) CM/SW Contact:  Ewing Schlein, LCSW Phone Number: 12/11/2022, 9:58 AM  Clinical Narrative: Patient is expected to discharge home after working with PT. CSW met with patient and spouse, Marai Maros, to confirm discharge plan. Patient will go home with a home exercise program (HEP). Patient has a rolling walker and cane at home, so there are no DME needs at this time. TOC signing off.   Final next level of care: Home/Self Care Barriers to Discharge: No Barriers Identified  Patient Goals and CMS Choice Choice offered to / list presented to : NA  Discharge Plan and Services Additional resources added to the After Visit Summary for         DME Arranged: N/A DME Agency: NA  Social Determinants of Health (SDOH) Interventions SDOH Screenings   Food Insecurity: No Food Insecurity (12/10/2022)  Housing: Low Risk  (12/10/2022)  Transportation Needs: No Transportation Needs (12/10/2022)  Utilities: Not At Risk (12/10/2022)  Physical Activity: Unknown (08/29/2021)   Received from [facility] visits prior to 06/02/2022.  Tobacco Use: Medium Risk (12/10/2022)   Readmission Risk Interventions     No data to display

## 2022-12-12 DIAGNOSIS — M1611 Unilateral primary osteoarthritis, right hip: Secondary | ICD-10-CM | POA: Diagnosis not present

## 2022-12-12 LAB — CBC
HCT: 25.8 % — ABNORMAL LOW (ref 36.0–46.0)
Hemoglobin: 8.2 g/dL — ABNORMAL LOW (ref 12.0–15.0)
MCH: 32.9 pg (ref 26.0–34.0)
MCHC: 31.8 g/dL (ref 30.0–36.0)
MCV: 103.6 fL — ABNORMAL HIGH (ref 80.0–100.0)
Platelets: 135 10*3/uL — ABNORMAL LOW (ref 150–400)
RBC: 2.49 MIL/uL — ABNORMAL LOW (ref 3.87–5.11)
RDW: 12.4 % (ref 11.5–15.5)
WBC: 16.1 10*3/uL — ABNORMAL HIGH (ref 4.0–10.5)
nRBC: 0 % (ref 0.0–0.2)

## 2022-12-12 MED ORDER — ALUM & MAG HYDROXIDE-SIMETH 200-200-20 MG/5ML PO SUSP
30.0000 mL | Freq: Four times a day (QID) | ORAL | Status: DC | PRN
  Filled 2022-12-12: qty 30

## 2022-12-12 NOTE — Progress Notes (Signed)
Physical Therapy Treatment Patient Details Name: Tiffany Wiggins MRN: [id number] DOB: 20-Feb-1946 Today's Date: 12/12/2022   History of Present Illness 77 y.o. female admitted 12/10/22 for R AA-THA. PMH: L THA with post op NSTEMI 08/2021, anxiety, CVA, HTN, IBS.    PT Comments  Pt is progressing with mobility, she ambulated 42' with a straight cane and hand held assist from her husband, no loss of balance. Pt was not able to safely use RW and she repeatedly released her grip from the walker despite verbal and manual cues to keep her hands on the walker.  Reviewed HEP with pt/spouse. Will plan to do stair training this afternoon, then I expect she will be ready to DC home.    If plan is discharge home, recommend the following: A little help with walking and/or transfers;A little help with bathing/dressing/bathroom;Assistance with cooking/housework;Assist for transportation;Help with stairs or ramp for entrance;Direct supervision/assist for financial management;Direct supervision/assist for medications management   Can travel by private vehicle        Equipment Recommendations  None recommended by PT    Recommendations for Other Services       Precautions / Restrictions Precautions Precautions: Fall Precaution Comments: decreased safety awareness 2* dementia Restrictions Weight Bearing Restrictions: No     Mobility  Bed Mobility Overal bed mobility: Needs Assistance Bed Mobility: Supine to Sit     Supine to sit: Mod assist     General bed mobility comments: assist to raise trunk and pivot hips to EOB    Transfers Overall transfer level: Needs assistance Equipment used: Rolling walker (2 wheels) Transfers: Sit to/from Stand Sit to Stand: Min assist           General transfer comment: min A from elevated bed, verbal cues for hand placement    Ambulation/Gait Ambulation/Gait assistance: Min assist Gait Distance (Feet): 70 Feet Assistive device: 1 person hand held  assist, Straight cane Gait Pattern/deviations: Step-to pattern, Decreased step length - right, Decreased step length - left Gait velocity: decr     General Gait Details: pt oriented to self only, does not follow 1 step commands. Pt was using straight cane at baseline and is not demonstrating safe use of RW as it it unfamiliar to her (she repeatedly lets go of the walker), so trial of cane and hand held assist was done today. Spouse provided hand held assist. Pt was steady with this technique. Distance limited by pain.   Stairs             Wheelchair Mobility     Tilt Bed    Modified Rankin (Stroke Patients Only)       Balance Overall balance assessment: Needs assistance Sitting-balance support: Feet supported Sitting balance-Leahy Scale: Fair     Standing balance support: Bilateral upper extremity supported, During functional activity Standing balance-Leahy Scale: Poor                              Cognition Arousal: Alert Behavior During Therapy: Anxious, Impulsive Overall Cognitive Status: History of cognitive impairments - at baseline                                 General Comments: oriented to self only, does not follow 1 step commands        Exercises Total Joint Exercises Ankle Circles/Pumps: AROM, Both, 10 reps, Supine Quad Sets: Limitations Quad  Sets Limitations: pt unable to perform quad set with max verbal/tactile cues Short Arc Quad: AROM, AAROM, Right, Supine, 5 reps, Limitations Short Arc Quad Limitations: pt had difficulty performing this with correct technique despite max verbal/tactile cues Heel Slides: AAROM, Right, Supine, 10 reps Hip ABduction/ADduction: AAROM, Right, Supine, 10 reps    General Comments        Pertinent Vitals/Pain Pain Assessment Faces Pain Scale: Hurts even more Breathing: occasional labored breathing, short period of hyperventilation Negative Vocalization: occasional moan/groan, low  speech, negative/disapproving quality Facial Expression: sad, frightened, frown Body Language: tense, distressed pacing, fidgeting Consolability: distracted or reassured by voice/touch PAINAD Score: 5 Pain Location: R hip Pain Descriptors / Indicators: Guarding, Operative site guarding, Grimacing Pain Intervention(s): Limited activity within patient's tolerance, Monitored during session, Premedicated before session, Ice applied, Repositioned    Home Living                          Prior Function            PT Goals (current goals can now be found in the care plan section) Acute Rehab PT Goals Patient Stated Goal: to walk safely PT Goal Formulation: With family Time For Goal Achievement: 12/18/22 Potential to Achieve Goals: Fair Progress towards PT goals: Progressing toward goals    Frequency    7X/week      PT Plan      Co-evaluation              AM-PAC PT "6 Clicks" Mobility   Outcome Measure  Help needed turning from your back to your side while in a flat bed without using bedrails?: A Little Help needed moving from lying on your back to sitting on the side of a flat bed without using bedrails?: A Lot Help needed moving to and from a bed to a chair (including a wheelchair)?: A Little Help needed standing up from a chair using your arms (e.g., wheelchair or bedside chair)?: A Little Help needed to walk in hospital room?: A Little Help needed climbing 3-5 steps with a railing? : A Lot 6 Click Score: 16    End of Session Equipment Utilized During Treatment: Gait belt Activity Tolerance: Patient limited by pain Patient left: in chair;with chair alarm set;with call bell/phone within reach;with family/visitor present Nurse Communication: Mobility status PT Visit Diagnosis: Difficulty in walking, not elsewhere classified (R26.2);Pain;Unsteadiness on feet (R26.81) Pain - Right/Left: Right Pain - part of body: Hip     Time: 1122-1150 PT Time  Calculation (min) (ACUTE ONLY): 28 min  Charges:    $Gait Training: 8-22 mins $Therapeutic Exercise: 8-22 mins PT General Charges $$ ACUTE PT VISIT: 1 Visit                     Ralene Bathe Kistler PT 12/12/2022  Acute Rehabilitation Services  Office [phone]

## 2022-12-12 NOTE — Progress Notes (Signed)
   Subjective: 2 Days Post-Op Procedure(s) (LRB): TOTAL HIP ARTHROPLASTY ANTERIOR APPROACH (Right) Patient seen in rounds by Dr. Lequita Halt. Patient is well, and has had no acute complaints or problems. Denies SOB or chest pain. Denies calf pain. Patient reports pain as moderate.    Objective: Vital signs in last 24 hours: Temp:  [97.4 F (36.3 C)-98.2 F (36.8 C)] 97.8 F (36.6 C) (09/11 0658) Pulse Rate:  [61-77] 77 (09/11 0658) Resp:  [16-18] 17 (09/11 0658) BP: (119-151)/(48-64) 119/50 (09/11 0658) SpO2:  [94 %-99 %] 96 % (09/11 0658)  Intake/Output from previous day:  Intake/Output Summary (Last 24 hours) at 12/12/2022 0800 Last data filed at 12/12/2022 0549 Gross per 24 hour  Intake 300 ml  Output --  Net 300 ml    Intake/Output this shift: No intake/output data recorded.  Labs: Recent Labs    12/11/22 0254 12/12/22 0240  HGB 9.2* 8.2*   Recent Labs    12/11/22 0254 12/12/22 0240  WBC 11.5* 16.1*  RBC 2.82* 2.49*  HCT 29.5* 25.8*  PLT 141* 135*   Recent Labs    12/11/22 0254  NA 135  K 3.7  CL 100  CO2 22  BUN 36*  CREATININE 1.56*  GLUCOSE 216*  CALCIUM 9.1   No results for input(s): "LABPT", "INR" in the last 72 hours.  Exam: General - Patient is Alert and Oriented Extremity - Neurologically intact Neurovascular intact Sensation intact distally Dorsiflexion/Plantar flexion intact Dressing/Incision - clean, dry, no drainage Motor Function - intact, moving foot and toes well on exam.  Past Medical History:  Diagnosis Date   Anemia    Anxiety    Arthritis    Carotid stenosis, bilateral    CVA (cerebral vascular accident) (HCC) 2019   Diabetes mellitus type 2 in nonobese (HCC)    Hyperlipidemia    Hypertension    PCP GAVE FOR HER  BLOOD PRESSURE   IBS (irritable bowel syndrome)    Memory deficit    PUD (peptic ulcer disease)    TIA (transient ischemic attack) 2006    Assessment/Plan: 2 Days Post-Op Procedure(s) (LRB): TOTAL HIP  ARTHROPLASTY ANTERIOR APPROACH (Right) Principal Problem:   Hip arthritis Active Problems:   Osteoarthritis of right hip  Estimated body mass index is 24.53 kg/m as calculated from the following:   Height as of this encounter: 5\' 6"  (1.676 m).   Weight as of this encounter: 68.9 kg.  DVT Prophylaxis -  Eliquis Weight-bearing as tolerated.  Continue physical therapy. Expected discharge home today if meeting patient goals and husband feels comfortable with care. Will do HEP once discharged. Follow-up in clinic in 2 weeks.  Alfonzo Feller, PA-C Orthopedic Surgery 12/12/2022, 8:00 AM

## 2022-12-12 NOTE — Progress Notes (Signed)
Physical Therapy Treatment Patient Details Name: Tiffany Wiggins MRN: [id number] DOB: Mar 14, 1946 Today's Date: 12/12/2022   History of Present Illness 77 y.o. female admitted 12/10/22 for R AA-THA. PMH: L THA with post op NSTEMI 08/2021, anxiety, CVA, HTN, IBS.    PT Comments  Pt is progressing with mobility, she tolerated increased ambulation distance of 91' with a cane and hand held assist from her husband. Stair training completed. She is ready to DC home from a PT standpoint.    If plan is discharge home, recommend the following: A little help with walking and/or transfers;A little help with bathing/dressing/bathroom;Assistance with cooking/housework;Assist for transportation;Help with stairs or ramp for entrance;Direct supervision/assist for financial management;Direct supervision/assist for medications management   Can travel by private vehicle        Equipment Recommendations  None recommended by PT    Recommendations for Other Services       Precautions / Restrictions Precautions Precautions: Fall Precaution Comments: decreased safety awareness 2* dementia Restrictions Weight Bearing Restrictions: No     Mobility  Bed Mobility Overal bed mobility: Needs Assistance Bed Mobility: Supine to Sit     Supine to sit: Min assist     General bed mobility comments: assist to raise trunk and pivot hips to EOB    Transfers Overall transfer level: Needs assistance Equipment used: Rolling walker (2 wheels) Transfers: Sit to/from Stand Sit to Stand: Min assist           General transfer comment: min A from elevated bed, verbal cues for hand placement    Ambulation/Gait Ambulation/Gait assistance: Min assist Gait Distance (Feet): 90 Feet Assistive device: 1 person hand held assist, Straight cane Gait Pattern/deviations: Step-to pattern, Decreased step length - right, Decreased step length - left Gait velocity: decr     General Gait Details: pt oriented to self only,  does not follow 1 step commands. Pt was using straight cane at baseline and is not demonstrating safe use of RW as it it unfamiliar to her (she repeatedly lets go of the walker), so using  cane and hand held assist  today. Spouse provided hand held assist. Pt was steady with this technique. Distance limited by pain.   Stairs Stairs: Yes Stairs assistance: Min assist Stair Management: Forwards, Step to pattern, With cane, One rail Left Number of Stairs: 3 General stair comments: verbal and tactile cues for sequencing   Wheelchair Mobility     Tilt Bed    Modified Rankin (Stroke Patients Only)       Balance Overall balance assessment: Needs assistance Sitting-balance support: Feet supported Sitting balance-Leahy Scale: Fair     Standing balance support: Bilateral upper extremity supported, During functional activity Standing balance-Leahy Scale: Poor                              Cognition Arousal: Alert Behavior During Therapy: Anxious, Impulsive Overall Cognitive Status: History of cognitive impairments - at baseline                                 General Comments: oriented to self only, does not follow 1 step commands        Exercises Total Joint Exercises Ankle Circles/Pumps: AROM, Both, 10 reps, Supine Quad Sets: Limitations Quad Sets Limitations: pt unable to perform quad set with max verbal/tactile cues Short Arc Quad: AROM, AAROM, Right, Supine, 5 reps,  Limitations Short Arc Quad Limitations: pt had difficulty performing this with correct technique despite max verbal/tactile cues Heel Slides: AAROM, Right, Supine, 10 reps Hip ABduction/ADduction: AAROM, Right, Supine, 10 reps    General Comments        Pertinent Vitals/Pain Pain Assessment Faces Pain Scale: Hurts even more Breathing: occasional labored breathing, short period of hyperventilation Negative Vocalization: occasional moan/groan, low speech, negative/disapproving  quality Facial Expression: sad, frightened, frown Body Language: tense, distressed pacing, fidgeting Consolability: distracted or reassured by voice/touch PAINAD Score: 5 Pain Location: R hip Pain Descriptors / Indicators: Guarding, Operative site guarding, Grimacing Pain Intervention(s): Limited activity within patient's tolerance, Monitored during session, Premedicated before session, Ice applied    Home Living                          Prior Function            PT Goals (current goals can now be found in the care plan section) Acute Rehab PT Goals Patient Stated Goal: to walk safely PT Goal Formulation: With family Time For Goal Achievement: 12/18/22 Potential to Achieve Goals: Fair Progress towards PT goals: Progressing toward goals    Frequency    7X/week      PT Plan      Co-evaluation              AM-PAC PT "6 Clicks" Mobility   Outcome Measure  Help needed turning from your back to your side while in a flat bed without using bedrails?: A Little Help needed moving from lying on your back to sitting on the side of a flat bed without using bedrails?: A Little Help needed moving to and from a bed to a chair (including a wheelchair)?: A Little Help needed standing up from a chair using your arms (e.g., wheelchair or bedside chair)?: A Little Help needed to walk in hospital room?: A Little Help needed climbing 3-5 steps with a railing? : A Little 6 Click Score: 18    End of Session Equipment Utilized During Treatment: Gait belt Activity Tolerance: Patient limited by pain;Patient tolerated treatment well Patient left: in chair;with chair alarm set;with call bell/phone within reach;with family/visitor present Nurse Communication: Mobility status PT Visit Diagnosis: Difficulty in walking, not elsewhere classified (R26.2);Pain;Unsteadiness on feet (R26.81) Pain - Right/Left: Right Pain - part of body: Hip     Time: 4098-1191 PT Time Calculation  (min) (ACUTE ONLY): 23 min  Charges:    $Gait Training: 8-22 mins  $Therapeutic Activity: 8-22 mins PT General Charges $$ ACUTE PT VISIT: 1 Visit                     Ralene Bathe Kistler PT 12/12/2022  Acute Rehabilitation Services  Office [phone]

## 2022-12-13 ENCOUNTER — Emergency Department (HOSPITAL_COMMUNITY): Payer: Medicare Other

## 2022-12-13 ENCOUNTER — Other Ambulatory Visit: Payer: Self-pay

## 2022-12-13 ENCOUNTER — Emergency Department (HOSPITAL_COMMUNITY)
Admission: EM | Admit: 2022-12-13 | Discharge: 2022-12-13 | Disposition: A | Discharge status: Home / Self Care | Payer: Medicare Other | Attending: Emergency Medicine | Admitting: Emergency Medicine

## 2022-12-13 DIAGNOSIS — Z7901 Long term (current) use of anticoagulants: Secondary | ICD-10-CM | POA: Insufficient documentation

## 2022-12-13 DIAGNOSIS — Z79899 Other long term (current) drug therapy: Secondary | ICD-10-CM | POA: Diagnosis not present

## 2022-12-13 DIAGNOSIS — N179 Acute kidney failure, unspecified: Secondary | ICD-10-CM | POA: Diagnosis not present

## 2022-12-13 DIAGNOSIS — I10 Essential (primary) hypertension: Secondary | ICD-10-CM | POA: Insufficient documentation

## 2022-12-13 DIAGNOSIS — R1110 Vomiting, unspecified: Secondary | ICD-10-CM | POA: Diagnosis present

## 2022-12-13 DIAGNOSIS — E119 Type 2 diabetes mellitus without complications: Secondary | ICD-10-CM | POA: Insufficient documentation

## 2022-12-13 DIAGNOSIS — Z96641 Presence of right artificial hip joint: Secondary | ICD-10-CM | POA: Diagnosis not present

## 2022-12-13 DIAGNOSIS — D72829 Elevated white blood cell count, unspecified: Secondary | ICD-10-CM | POA: Diagnosis not present

## 2022-12-13 DIAGNOSIS — D649 Anemia, unspecified: Secondary | ICD-10-CM | POA: Insufficient documentation

## 2022-12-13 DIAGNOSIS — R112 Nausea with vomiting, unspecified: Secondary | ICD-10-CM

## 2022-12-13 LAB — CBC WITH DIFFERENTIAL/PLATELET
Abs Immature Granulocytes: 0.04 10*3/uL (ref 0.00–0.07)
Basophils Absolute: 0 10*3/uL (ref 0.0–0.1)
Basophils Relative: 0 %
Eosinophils Absolute: 0 10*3/uL (ref 0.0–0.5)
Eosinophils Relative: 0 %
HCT: 25.9 % — ABNORMAL LOW (ref 36.0–46.0)
Hemoglobin: 8.3 g/dL — ABNORMAL LOW (ref 12.0–15.0)
Immature Granulocytes: 0 %
Lymphocytes Relative: 11 %
Lymphs Abs: 1.2 10*3/uL (ref 0.7–4.0)
MCH: 33.5 pg (ref 26.0–34.0)
MCHC: 32 g/dL (ref 30.0–36.0)
MCV: 104.4 fL — ABNORMAL HIGH (ref 80.0–100.0)
Monocytes Absolute: 0.8 10*3/uL (ref 0.1–1.0)
Monocytes Relative: 7 %
Neutro Abs: 9.5 10*3/uL — ABNORMAL HIGH (ref 1.7–7.7)
Neutrophils Relative %: 82 %
Platelets: 158 10*3/uL (ref 150–400)
RBC: 2.48 MIL/uL — ABNORMAL LOW (ref 3.87–5.11)
RDW: 12.7 % (ref 11.5–15.5)
WBC: 11.6 10*3/uL — ABNORMAL HIGH (ref 4.0–10.5)
nRBC: 0 % (ref 0.0–0.2)

## 2022-12-13 LAB — COMPREHENSIVE METABOLIC PANEL
ALT: 12 U/L (ref 0–44)
AST: 40 U/L (ref 15–41)
Albumin: 3.4 g/dL — ABNORMAL LOW (ref 3.5–5.0)
Alkaline Phosphatase: 53 U/L (ref 38–126)
Anion gap: 13 (ref 5–15)
BUN: 44 mg/dL — ABNORMAL HIGH (ref 8–23)
CO2: 24 mmol/L (ref 22–32)
Calcium: 9.1 mg/dL (ref 8.9–10.3)
Chloride: 101 mmol/L (ref 98–111)
Creatinine, Ser: 2.1 mg/dL — ABNORMAL HIGH (ref 0.44–1.00)
GFR, Estimated: 24 mL/min — ABNORMAL LOW (ref >60)
Glucose, Bld: 128 mg/dL — ABNORMAL HIGH (ref 70–99)
Potassium: 3.7 mmol/L (ref 3.5–5.1)
Sodium: 138 mmol/L (ref 135–145)
Total Bilirubin: 0.8 mg/dL (ref 0.3–1.2)
Total Protein: 6.1 g/dL — ABNORMAL LOW (ref 6.5–8.1)

## 2022-12-13 LAB — LACTIC ACID, PLASMA: Lactic Acid, Venous: 1 mmol/L (ref 0.5–1.9)

## 2022-12-13 LAB — MAGNESIUM: Magnesium: 2.4 mg/dL (ref 1.7–2.4)

## 2022-12-13 LAB — LIPASE, BLOOD: Lipase: 21 U/L (ref 11–51)

## 2022-12-13 MED ORDER — LACTATED RINGERS IV BOLUS
1000.0000 mL | Freq: Once | INTRAVENOUS | Status: AC
  Administered 2022-12-13: 1000 mL via INTRAVENOUS

## 2022-12-13 MED ORDER — OXYCODONE HCL 5 MG PO TABS
2.5000 mg | ORAL_TABLET | Freq: Once | ORAL | Status: AC
  Administered 2022-12-13: 2.5 mg via ORAL
  Filled 2022-12-13: qty 1

## 2022-12-13 MED ORDER — ONDANSETRON HCL 4 MG/2ML IJ SOLN
4.0000 mg | Freq: Once | INTRAMUSCULAR | Status: AC | PRN
  Administered 2022-12-13: 4 mg via INTRAVENOUS
  Filled 2022-12-13: qty 2

## 2022-12-13 NOTE — ED Provider Notes (Signed)
[facility] AT [facility] Provider Note   CSN: [id number] Arrival date & time: 12/13/22  1918     History  Chief Complaint  Patient presents with   Emesis    Tiffany Wiggins is a 77 y.o. female with PMH as listed below who presents after a total right hip replacement on 9/9 without complications and discharged yesterday afternoon. Pt started having NV on arrival home. Prescribed zofran has not relieved s/s. Pt has been unable to tolerate fluids and unable to keep medication down since yesterday afternoon. Pt forgetful, poor historian, information obtained by Husband Fred at bedside.  She reports that she has not had any abdominal pain however she is unable to pass gas and has not had a bowel movement since the surgery yesterday.  She has never had surgery on her belly.  Denies any fevers chills, chest pain, shortness of breath, urinary symptoms. She has been ambulatory on the leg since the R hip replacement yesterday and reports that her pain in the R hip is minimal.  Denies fever/chills, cough, chest pain, shortness of breath, leg swelling, hematochezia/melena.  Past Medical History:  Diagnosis Date   Anemia    Anxiety    Arthritis    Carotid stenosis, bilateral    CVA (cerebral vascular accident) (HCC) 2019   Diabetes mellitus type 2 in nonobese (HCC)    Hyperlipidemia    Hypertension    PCP GAVE FOR HER  BLOOD PRESSURE   IBS (irritable bowel syndrome)    Memory deficit    PUD (peptic ulcer disease)    TIA (transient ischemic attack) 2006       Home Medications Prior to Admission medications   Medication Sig Start Date End Date Taking? Authorizing Provider  acetaminophen (TYLENOL) 650 MG CR tablet Take 650 mg by mouth 2 (two) times daily.    [provider]  amLODipine (NORVASC) 5 MG tablet TAKE 1 TABLET BY MOUTH ONCE DAILY MUST HAVE APPOINTMENT FOR REFILLS 12/11/22   Georgeanna Lea, MD  apixaban (ELIQUIS) 5 MG TABS tablet Take 1  tablet (5 mg total) by mouth 2 (two) times daily. 12/10/18   Burnadette Pop, MD  azelastine (ASTELIN) 0.1 % nasal spray Place 2 sprays into both nostrils daily as needed for rhinitis or allergies. 05/10/20   [provider]  furosemide (LASIX) 40 MG tablet Take 1 tablet (40 mg total) by mouth daily. 12/05/21   Meriam Sprague, MD  isosorbide mononitrate (IMDUR) 60 MG 24 hr tablet Take 1 tablet (60 mg total) by mouth daily. 11/30/22   Georgeanna Lea, MD  losartan (COZAAR) 25 MG tablet Take 25 mg by mouth daily. 10/18/22   [provider]  losartan (COZAAR) 50 MG tablet Take 1 tablet by mouth once daily Patient not taking: Reported on 11/22/2022 06/19/21   Duke Salvia, MD  Magnesium 250 MG TABS Take 250 mg by mouth daily.    [provider]  methocarbamol (ROBAXIN) 500 MG tablet Take 1 tablet (500 mg total) by mouth every 6 (six) hours as needed for muscle spasms. 12/11/22   Edmisten, Lyn Hollingshead, PA  nitroGLYCERIN (NITROSTAT) 0.4 MG SL tablet Place 1 tablet (0.4 mg total) under the tongue every 5 (five) minutes as needed for chest pain. 07/06/21   Georgeanna Lea, MD  ondansetron (ZOFRAN) 4 MG tablet Take 1 tablet (4 mg total) by mouth every 6 (six) hours as needed for nausea. 12/11/22   Edmisten, Lyn Hollingshead,  PA  propranolol (INDERAL) 10 MG tablet Take 10 mg by mouth daily as needed (racing heart).    [provider]  rosuvastatin (CRESTOR) 20 MG tablet Take 1 tablet (20 mg total) by mouth daily. Patient taking differently: Take 20 mg by mouth every evening. 09/13/21 12/10/22  Narda Bonds, MD  traMADol (ULTRAM) 50 MG tablet Take 1 tablet (50 mg total) by mouth every 6 (six) hours as needed for moderate pain. 12/11/22   Edmisten, Lyn Hollingshead, PA  trolamine salicylate (ASPERCREME) 10 % cream Apply 1 Application topically daily as needed for muscle pain (Back pain).    [provider]      Allergies    Codeine, Morphine and codeine, Ativan [lorazepam],  Dilaudid [hydromorphone], and Statins    Review of Systems   Review of Systems A 10 point review of systems was performed and is negative unless otherwise reported in HPI.  Physical Exam Updated Vital Signs BP (!) 125/48   Pulse 60   Temp 98.5 F (36.9 C) (Oral)   Resp 17   Ht 5\' 6"  (1.676 m)   Wt 68.9 kg   SpO2 100%   BMI 24.53 kg/m  Physical Exam General: Normal appearing elderly female, lying in bed.  HEENT: PERRLA, Sclera anicteric, dry mucous membranes, trachea midline.  Cardiology: RRR, no murmurs/rubs/gallops.  Resp: Normal respiratory rate and effort. CTAB, no wheezes, rhonchi, crackles.  Abd: Soft, non-tender, non-distended. No rebound tenderness or guarding.  GU: Deferred. MSK: Well-healing bandage surgical incision on right anterior hip with no strikethrough on the dressing.  Mild tenderness palpation of the area with no erythema or purulent discharge.  No peripheral edema or signs of trauma.  Skin: warm, dry.  Back: No CVA tenderness Neuro: Alert, answers questions, A&O x 2 (not oriented to place or situation) CNs II-XII grossly intact. MAEs. Sensation grossly intact.  Psych: Irritable mood and affect.   ED Results / Procedures / Treatments   Labs (all labs ordered are listed, but only abnormal results are displayed) Labs Reviewed  COMPREHENSIVE METABOLIC PANEL - Abnormal; Notable for the following components:      Result Value   Glucose, Bld 128 (*)    BUN 44 (*)    Creatinine, Ser 2.10 (*)    Total Protein 6.1 (*)    Albumin 3.4 (*)    GFR, Estimated 24 (*)    All other components within normal limits  CBC WITH DIFFERENTIAL/PLATELET - Abnormal; Notable for the following components:   WBC 11.6 (*)    RBC 2.48 (*)    Hemoglobin 8.3 (*)    HCT 25.9 (*)    MCV 104.4 (*)    Neutro Abs 9.5 (*)    All other components within normal limits  MAGNESIUM  LIPASE, BLOOD  LACTIC ACID, PLASMA    EKG None  Radiology CT ABDOMEN PELVIS WO CONTRAST  Result  Date: 12/13/2022 CLINICAL DATA:  Abdominal pain. Concern for bowel obstruction. Nausea and vomiting. EXAM: CT ABDOMEN AND PELVIS WITHOUT CONTRAST TECHNIQUE: Multidetector CT imaging of the abdomen and pelvis was performed following the standard protocol without IV contrast. RADIATION DOSE REDUCTION: This exam was performed according to the departmental dose-optimization program which includes automated exposure control, adjustment of the mA and/or kV according to patient size and/or use of iterative reconstruction technique. COMPARISON:  CT abdomen pelvis dated 10/13/2020. FINDINGS: Evaluation of this exam is limited in the absence of intravenous contrast. Evaluation of the pelvic structures is limited due to streak artifact  caused by bilateral hip arthroplasties. Lower chest: Bibasilar subpleural linear atelectasis/scarring. There is a small right pleural effusion. Coronary vascular calcification. No intra-abdominal free air or free fluid. Hepatobiliary: The liver is unremarkable. No biliary dilatation. Probable sludge in the gallbladder. No calcified gallstone or pericholecystic fluid. Pancreas: Unremarkable. No pancreatic ductal dilatation or surrounding inflammatory changes. Spleen: Normal in size without focal abnormality. Adrenals/Urinary Tract: The adrenal glands are unremarkable. There is no hydronephrosis or nephrolithiasis on either side. The visualized ureters and urinary bladder appear unremarkable. Stomach/Bowel: There is sigmoid diverticulosis without active inflammatory changes. Postsurgical changes of the bowel with ileocolic anastomosis in the right upper abdomen. There is no bowel obstruction or active inflammation. Appendectomy. Vascular/Lymphatic: Advanced aortoiliac atherosclerotic disease. The IVC is unremarkable. No portal venous gas. There is no adenopathy. Reproductive: Hysterectomy.  No adnexal masses. Other: None Musculoskeletal: Osteopenia with degenerative changes of the spine. Bilateral  total hip arthroplasties. Nondisplaced fracture of the greater trochanter of the right femur extending into the femoral component of the arthroplasty. IMPRESSION: 1. Nondisplaced fracture of the greater trochanter of the right femur abutting the arthroplasty. 2. No acute intra-abdominal or pelvic pathology. No bowel obstruction. 3. Sigmoid diverticulosis. 4.  Aortic Atherosclerosis (ICD10-I70.0). Electronically Signed   By: Elgie Collard M.D.   On: 12/13/2022 22:43    Procedures Procedures    Medications Ordered in ED Medications  ondansetron (ZOFRAN) injection 4 mg (4 mg Intravenous Given 12/13/22 1957)  lactated ringers bolus 1,000 mL (0 mLs Intravenous Stopped 12/13/22 2325)  oxyCODONE (Oxy IR/ROXICODONE) immediate release tablet 2.5 mg (2.5 mg Oral Given 12/13/22 2324)    ED Course/ Medical Decision Making/ A&P                          Medical Decision Making Amount and/or Complexity of Data Reviewed Labs: ordered. Decision-making details documented in ED Course. Radiology: ordered.  Risk Prescription drug management.    This patient presents to the ED for concern of post-operative nausea/vomiting, this involves an extensive number of treatment options, and is a complaint that carries with it a high risk of complications and morbidity.  I considered the following differential and admission for this acute, potentially life threatening condition.   MDM:    DDX for this patient's nausea/vomiting includes but is not limited to:  Consider post-operative nausea vomiting, ileus or SBO. Patient has no apparent abdominal pain on exam but has not had a bowel movement and is not passing gas and will obtain CT abd pelvis for further evaluation.  Consider pancreatitis, electrolyte derangements or renal injury.  Patient has very dry mucous membranes and is likely dehydrated, will give fluids.  She has no abdominal tenderness to palpation or report of pain to raise concern for mesenteric  ischemia or intra-abdominal infections including appendicitis, biliary disease such as cholecystitis or cholelithiasis, diverticulitis.   Clinical Course as of 12/14/22 1535  Thu Dec 13, 2022  2041 Creatinine(!): 2.10 +AKI, will giving fluids. Baseline 1.5-1.7.  [HN]  2218 Hemoglobin(!): 8.3 Stable from yesterday, decreased over last few weeks [HN]  2219 Lipase: 21 Neg lipase [HN]  2231 Lactic Acid, Venous: 1.0 Neg lactate [HN]  2231 WBC(!): 11.6 Decreasing leukocytosis since yesterday [HN]  2258 Discussed with patient and her husband.  Patient states that she is having a lot of her back pain which is chronic for her and she would like to be discharged.  Her CT of the pelvis did not demonstrate any acute findings  except a fracture of the greater trochanter that abuts the implant. This is likely post-operative. Patient is ambulatory on the leg and denies significant pain in the hip. I offered patient and her husband further evaluation of this or consult to ortho but they state she has been walking just fine and they will call their surgeon in the morning and would like to be discharged now. She has tolerated PO in the ED. Will be DC'd w/ instructions to call their surgeon in the AM and take zofran at home, stay well hydrated. DC w/ discharge instructions/return precautions. All questions answered to patient's satisfaction.   [HN]    Clinical Course User Index [HN] Loetta Rough, MD    Labs: I Ordered, and personally interpreted labs.  The pertinent results include:  those listed above  Imaging Studies ordered: I ordered imaging studies including CT abd pelvis wo contrast I independently visualized and interpreted imaging. I agree with the radiologist interpretation  Additional history obtained from chart review, husband at bedside  Cardiac Monitoring: The patient was maintained on a cardiac monitor.  I personally viewed and interpreted the cardiac monitored which showed an underlying  rhythm of: NSR  Reevaluation: After the interventions noted above, I reevaluated the patient and found that they have :improved  Social Determinants of Health: Lives independently  Disposition:  DC w/ discharge instructions/return precautions. All questions answered to patient's satisfaction.    Co morbidities that complicate the patient evaluation  Past Medical History:  Diagnosis Date   Anemia    Anxiety    Arthritis    Carotid stenosis, bilateral    CVA (cerebral vascular accident) (HCC) 2019   Diabetes mellitus type 2 in nonobese (HCC)    Hyperlipidemia    Hypertension    PCP GAVE FOR HER  BLOOD PRESSURE   IBS (irritable bowel syndrome)    Memory deficit    PUD (peptic ulcer disease)    TIA (transient ischemic attack) 2006     Medicines Meds ordered this encounter  Medications   ondansetron (ZOFRAN) injection 4 mg   lactated ringers bolus 1,000 mL   oxyCODONE (Oxy IR/ROXICODONE) immediate release tablet 2.5 mg    I have reviewed the patients home medicines and have made adjustments as needed  Problem List / ED Course: Problem List Items Addressed This Visit       Genitourinary   AKI (acute kidney injury) (HCC)   Other Visit Diagnoses     Nausea and vomiting, unspecified vomiting type    -  Primary                   This note was created using dictation software, which may contain spelling or grammatical errors.    Loetta Rough, MD 12/14/22 [phone]

## 2022-12-13 NOTE — ED Triage Notes (Signed)
Pt had total right hip replacement on 9/9 without complications and discharged yesterday afternoon. Pt started having NV on arrival home. Prescribed zofran has not relieved s/s. Pt has been unable to tolerate fluids and unable to keep medication down since yesterday afternoon. Pt forgetful, poor historian, information obtained by Husband Fred at bedside.

## 2022-12-13 NOTE — Discharge Instructions (Signed)
Thank you for coming to [facility] Emergency Department. You were seen for nausea and vomiting post-operatively. We did an exam, labs, and imaging, and these showed dehydration and acute kidney injury. You were treated with anti-nausea medicine and fluids in the ED with improvement of your symptoms. Incidentally, your imaging also demonstrated a fracture of the greater trochanter of the right femur abutting the implant. You preferred to be discharged and to call the surgeon in the morning. Please call Dr. Deri Fuelling office in the morning. Tiffany Wiggins is bearing weight on the leg and states her pain in the hip is minimal. Please take zofran 4 mg every 6-8 hours as needed for nausea/vomiting. Please stay well hydrated at home.   Do not hesitate to return to the ED or call 911 if you experience: -Worsening symptoms -Worsening right hip pain -Nausea/vomiting so severe you cannot eat/drink anything -Abdominal pain -Lightheadedness, passing out -Fevers/chills -Anything else that concerns you

## 2022-12-18 NOTE — Discharge Summary (Signed)
Patient ID: Tiffany Wiggins MRN: [id number] DOB/AGE: Feb 07, 1946 77 y.o.  Admit date: 12/10/2022 Discharge date: 12/12/2022  Admission Diagnoses:  Principal Problem:   Hip arthritis Active Problems:   Osteoarthritis of right hip   Discharge Diagnoses:  Same  Past Medical History:  Diagnosis Date   Anemia    Anxiety    Arthritis    Carotid stenosis, bilateral    CVA (cerebral vascular accident) (HCC) 2019   Diabetes mellitus type 2 in nonobese (HCC)    Hyperlipidemia    Hypertension    PCP GAVE FOR HER  BLOOD PRESSURE   IBS (irritable bowel syndrome)    Memory deficit    PUD (peptic ulcer disease)    TIA (transient ischemic attack) 2006    Surgeries: Procedure(s): TOTAL HIP ARTHROPLASTY ANTERIOR APPROACH on 12/10/2022   Consultants:   Discharged Condition: Improved  Hospital Course: Tiffany Wiggins is an 77 y.o. female who was admitted 12/10/2022 for operative treatment ofHip arthritis. Patient has severe unremitting pain that affects sleep, daily activities, and work/hobbies. After pre-op clearance the patient was taken to the operating room on 12/10/2022 and underwent  Procedure(s): TOTAL HIP ARTHROPLASTY ANTERIOR APPROACH.    Patient was given perioperative antibiotics:  Anti-infectives (From admission, onward)    Start     Dose/Rate Route Frequency Ordered Stop   12/11/22 0200  ceFAZolin (ANCEF) IVPB 2g/100 mL premix        2 g 200 mL/hr over 30 Minutes Intravenous Every 12 hours 12/10/22 1809 12/11/22 0201   12/10/22 2000  ceFAZolin (ANCEF) IVPB 2g/100 mL premix  Status:  Discontinued        2 g 200 mL/hr over 30 Minutes Intravenous Every 6 hours 12/10/22 1759 12/10/22 1809   12/10/22 1145  ceFAZolin (ANCEF) IVPB 2g/100 mL premix        2 g 200 mL/hr over 30 Minutes Intravenous On call to O.R. 12/10/22 1143 12/10/22 1449        Patient was given sequential compression devices, early ambulation, and chemoprophylaxis to prevent DVT.  Patient benefited maximally from  hospital stay and there were no complications.    Recent vital signs: No data found.   Recent laboratory studies: No results for input(s): "WBC", "HGB", "HCT", "PLT", "NA", "K", "CL", "CO2", "BUN", "CREATININE", "GLUCOSE", "INR", "CALCIUM" in the last 72 hours.  Invalid input(s): "PT", "2"   Discharge Medications:   Allergies as of 12/12/2022       Reactions   Codeine Other (See Comments)   Delirious   Morphine And Codeine Other (See Comments)   Delirious   Ativan [lorazepam] Other (See Comments)   Delirious and combative   Dilaudid [hydromorphone] Other (See Comments)   delirious   Statins Other (See Comments)        Medication List     TAKE these medications    acetaminophen 650 MG CR tablet Commonly known as: TYLENOL Take 650 mg by mouth 2 (two) times daily.   amLODipine 5 MG tablet Commonly known as: NORVASC TAKE 1 TABLET BY MOUTH ONCE DAILY MUST HAVE APPOINTMENT FOR REFILLS What changed: See the new instructions.   apixaban 5 MG Tabs tablet Commonly known as: ELIQUIS Take 1 tablet (5 mg total) by mouth 2 (two) times daily.   azelastine 0.1 % nasal spray Commonly known as: ASTELIN Place 2 sprays into both nostrils daily as needed for rhinitis or allergies.   furosemide 40 MG tablet Commonly known as: LASIX Take 1 tablet (40 mg total) by  mouth daily.   isosorbide mononitrate 60 MG 24 hr tablet Commonly known as: IMDUR Take 1 tablet (60 mg total) by mouth daily.   losartan 50 MG tablet Commonly known as: COZAAR Take 1 tablet by mouth once daily   losartan 25 MG tablet Commonly known as: COZAAR Take 25 mg by mouth daily.   Magnesium 250 MG Tabs Take 250 mg by mouth daily.   methocarbamol 500 MG tablet Commonly known as: ROBAXIN Take 1 tablet (500 mg total) by mouth every 6 (six) hours as needed for muscle spasms.   nitroGLYCERIN 0.4 MG SL tablet Commonly known as: NITROSTAT Place 1 tablet (0.4 mg total) under the tongue every 5 (five) minutes  as needed for chest pain.   ondansetron 4 MG tablet Commonly known as: ZOFRAN Take 1 tablet (4 mg total) by mouth every 6 (six) hours as needed for nausea.   propranolol 10 MG tablet Commonly known as: INDERAL Take 10 mg by mouth daily as needed (racing heart).   rosuvastatin 20 MG tablet Commonly known as: CRESTOR Take 1 tablet (20 mg total) by mouth daily. What changed: when to take this   traMADol 50 MG tablet Commonly known as: ULTRAM Take 1 tablet (50 mg total) by mouth every 6 (six) hours as needed for moderate pain.   trolamine salicylate 10 % cream Commonly known as: ASPERCREME Apply 1 Application topically daily as needed for muscle pain (Back pain).               Discharge Care Instructions  (From admission, onward)           Start     Ordered   12/11/22 0000  Weight bearing as tolerated        12/11/22 0827   12/11/22 0000  Change dressing       Comments: You have an adhesive waterproof bandage over the incision. Leave this in place until your first follow-up appointment. Once you remove this you will not need to place another bandage.   12/11/22 0827            Diagnostic Studies: CT ABDOMEN PELVIS WO CONTRAST  Result Date: 12/13/2022 CLINICAL DATA:  Abdominal pain. Concern for bowel obstruction. Nausea and vomiting. EXAM: CT ABDOMEN AND PELVIS WITHOUT CONTRAST TECHNIQUE: Multidetector CT imaging of the abdomen and pelvis was performed following the standard protocol without IV contrast. RADIATION DOSE REDUCTION: This exam was performed according to the departmental dose-optimization program which includes automated exposure control, adjustment of the mA and/or kV according to patient size and/or use of iterative reconstruction technique. COMPARISON:  CT abdomen pelvis dated 10/13/2020. FINDINGS: Evaluation of this exam is limited in the absence of intravenous contrast. Evaluation of the pelvic structures is limited due to streak artifact caused by  bilateral hip arthroplasties. Lower chest: Bibasilar subpleural linear atelectasis/scarring. There is a small right pleural effusion. Coronary vascular calcification. No intra-abdominal free air or free fluid. Hepatobiliary: The liver is unremarkable. No biliary dilatation. Probable sludge in the gallbladder. No calcified gallstone or pericholecystic fluid. Pancreas: Unremarkable. No pancreatic ductal dilatation or surrounding inflammatory changes. Spleen: Normal in size without focal abnormality. Adrenals/Urinary Tract: The adrenal glands are unremarkable. There is no hydronephrosis or nephrolithiasis on either side. The visualized ureters and urinary bladder appear unremarkable. Stomach/Bowel: There is sigmoid diverticulosis without active inflammatory changes. Postsurgical changes of the bowel with ileocolic anastomosis in the right upper abdomen. There is no bowel obstruction or active inflammation. Appendectomy. Vascular/Lymphatic: Advanced aortoiliac atherosclerotic disease. The  IVC is unremarkable. No portal venous gas. There is no adenopathy. Reproductive: Hysterectomy.  No adnexal masses. Other: None Musculoskeletal: Osteopenia with degenerative changes of the spine. Bilateral total hip arthroplasties. Nondisplaced fracture of the greater trochanter of the right femur extending into the femoral component of the arthroplasty. IMPRESSION: 1. Nondisplaced fracture of the greater trochanter of the right femur abutting the arthroplasty. 2. No acute intra-abdominal or pelvic pathology. No bowel obstruction. 3. Sigmoid diverticulosis. 4.  Aortic Atherosclerosis (ICD10-I70.0). Electronically Signed   By: Elgie Collard M.D.   On: 12/13/2022 22:43   DG HIP UNILAT WITH PELVIS 1V RIGHT  Result Date: 12/10/2022 CLINICAL DATA:  Elective surgery EXAM: DG HIP (WITH OR WITHOUT PELVIS) 1V RIGHT COMPARISON:  09/06/2021 FINDINGS: Two low resolution intraoperative spot views of the right hip. Total fluoroscopy time was 8  seconds, fluoroscopic dose of 1.019 mGy. Images demonstrate a right hip replacement with normal alignment. Sponge or packing material lateral to the right trochanter. IMPRESSION: Intraoperative fluoroscopic assistance provided during right hip replacement. Electronically Signed   By: Jasmine Pang M.D.   On: 12/10/2022 19:24   DG C-Arm 1-60 Min-No Report  Result Date: 12/10/2022 Fluoroscopy was utilized by the requesting physician.  No radiographic interpretation.   DG C-Arm 1-60 Min-No Report  Result Date: 12/10/2022 Fluoroscopy was utilized by the requesting physician.  No radiographic interpretation.    Disposition: Discharge disposition: 01-Home or Self Care       Discharge Instructions     Call MD / Call 911   Complete by: As directed    If you experience chest pain or shortness of breath, CALL 911 and be transported to the hospital emergency room.  If you develope a fever above 101 F, pus (white drainage) or increased drainage or redness at the wound, or calf pain, call your surgeon's office.   Change dressing   Complete by: As directed    You have an adhesive waterproof bandage over the incision. Leave this in place until your first follow-up appointment. Once you remove this you will not need to place another bandage.   Constipation Prevention   Complete by: As directed    Drink plenty of fluids.  Prune juice may be helpful.  You may use a stool softener, such as Colace (over the counter) 100 mg twice a day.  Use MiraLax (over the counter) for constipation as needed.   Diet - low sodium heart healthy   Complete by: As directed    Do not sit on low chairs, stoools or toilet seats, as it may be difficult to get up from low surfaces   Complete by: As directed    Driving restrictions   Complete by: As directed    No driving for two weeks   Post-operative opioid taper instructions:   Complete by: As directed    POST-OPERATIVE OPIOID TAPER INSTRUCTIONS: It is important to wean  off of your opioid medication as soon as possible. If you do not need pain medication after your surgery it is ok to stop day one. Opioids include: Codeine, Hydrocodone(Norco, Vicodin), Oxycodone(Percocet, oxycontin) and hydromorphone amongst others.  Long term and even short term use of opiods can cause: Increased pain response Dependence Constipation Depression Respiratory depression And more.  Withdrawal symptoms can include Flu like symptoms Nausea, vomiting And more Techniques to manage these symptoms Hydrate well Eat regular healthy meals Stay active Use relaxation techniques(deep breathing, meditating, yoga) Do Not substitute Alcohol to help with tapering If you have been  on opioids for less than two weeks and do not have pain than it is ok to stop all together.  Plan to wean off of opioids This plan should start within one week post op of your joint replacement. Maintain the same interval or time between taking each dose and first decrease the dose.  Cut the total daily intake of opioids by one tablet each day Next start to increase the time between doses. The last dose that should be eliminated is the evening dose.      TED hose   Complete by: As directed    Use stockings (TED hose) for three weeks on both leg(s).  You may remove them at night for sleeping.   Weight bearing as tolerated   Complete by: As directed         Follow-up Information     Aluisio, Homero Fellers, MD Follow up in 2 week(s).   Specialty: Orthopedic Surgery Contact information: [address] [place] [address] [place] [address] [phone]                  Signed: Arther Abbott 12/18/2022, 8:43 AM

## 2022-12-18 NOTE — Discharge Summary (Signed)
Patient ID: Tiffany Wiggins MRN: [id number] DOB/AGE: 76/19/1947 76 y.o.  Admit date: 12/10/2022 Discharge date: 12/12/2022  Admission Diagnoses:  Principal Problem:   Hip arthritis Active Problems:   Osteoarthritis of right hip   Discharge Diagnoses:  Same  Past Medical History:  Diagnosis Date   Anemia    Anxiety    Arthritis    Carotid stenosis, bilateral    CVA (cerebral vascular accident) (HCC) 2019   Diabetes mellitus type 2 in nonobese (HCC)    Hyperlipidemia    Hypertension    PCP GAVE FOR HER  BLOOD PRESSURE   IBS (irritable bowel syndrome)    Memory deficit    PUD (peptic ulcer disease)    TIA (transient ischemic attack) 2006    Surgeries: Procedure(s): TOTAL HIP ARTHROPLASTY ANTERIOR APPROACH on 12/10/2022   Consultants:   Discharged Condition: Improved  Hospital Course: Tiffany Wiggins is an 76 y.o. female who was admitted 12/10/2022 for operative treatment ofHip arthritis. Patient has severe unremitting pain that affects sleep, daily activities, and work/hobbies. After pre-op clearance the patient was taken to the operating room on 12/10/2022 and underwent  Procedure(s): TOTAL HIP ARTHROPLASTY ANTERIOR APPROACH.    Patient was given perioperative antibiotics:  Anti-infectives (From admission, onward)    Start     Dose/Rate Route Frequency Ordered Stop   12/11/22 0200  ceFAZolin (ANCEF) IVPB 2g/100 mL premix        2 g 200 mL/hr over 30 Minutes Intravenous Every 12 hours 12/10/22 1809 12/11/22 0201   12/10/22 2000  ceFAZolin (ANCEF) IVPB 2g/100 mL premix  Status:  Discontinued        2 g 200 mL/hr over 30 Minutes Intravenous Every 6 hours 12/10/22 1759 12/10/22 1809   12/10/22 1145  ceFAZolin (ANCEF) IVPB 2g/100 mL premix        2 g 200 mL/hr over 30 Minutes Intravenous On call to O.R. 12/10/22 1143 12/10/22 1449        Patient was given sequential compression devices, early ambulation, and chemoprophylaxis to prevent DVT.  Patient benefited maximally from  hospital stay and there were no complications.    Recent vital signs: No data found.   Recent laboratory studies: No results for input(s): "WBC", "HGB", "HCT", "PLT", "NA", "K", "CL", "CO2", "BUN", "CREATININE", "GLUCOSE", "INR", "CALCIUM" in the last 72 hours.  Invalid input(s): "PT", "2"   Discharge Medications:   Allergies as of 12/12/2022       Reactions   Codeine Other (See Comments)   Delirious   Morphine And Codeine Other (See Comments)   Delirious   Ativan [lorazepam] Other (See Comments)   Delirious and combative   Dilaudid [hydromorphone] Other (See Comments)   delirious   Statins Other (See Comments)        Medication List     TAKE these medications    acetaminophen 650 MG CR tablet Commonly known as: TYLENOL Take 650 mg by mouth 2 (two) times daily.   amLODipine 5 MG tablet Commonly known as: NORVASC TAKE 1 TABLET BY MOUTH ONCE DAILY MUST HAVE APPOINTMENT FOR REFILLS What changed: See the new instructions.   apixaban 5 MG Tabs tablet Commonly known as: ELIQUIS Take 1 tablet (5 mg total) by mouth 2 (two) times daily.   azelastine 0.1 % nasal spray Commonly known as: ASTELIN Place 2 sprays into both nostrils daily as needed for rhinitis or allergies.   furosemide 40 MG tablet Commonly known as: LASIX Take 1 tablet (40 mg total) by  mouth daily.   isosorbide mononitrate 60 MG 24 hr tablet Commonly known as: IMDUR Take 1 tablet (60 mg total) by mouth daily.   losartan 50 MG tablet Commonly known as: COZAAR Take 1 tablet by mouth once daily   losartan 25 MG tablet Commonly known as: COZAAR Take 25 mg by mouth daily.   Magnesium 250 MG Tabs Take 250 mg by mouth daily.   methocarbamol 500 MG tablet Commonly known as: ROBAXIN Take 1 tablet (500 mg total) by mouth every 6 (six) hours as needed for muscle spasms.   nitroGLYCERIN 0.4 MG SL tablet Commonly known as: NITROSTAT Place 1 tablet (0.4 mg total) under the tongue every 5 (five) minutes  as needed for chest pain.   ondansetron 4 MG tablet Commonly known as: ZOFRAN Take 1 tablet (4 mg total) by mouth every 6 (six) hours as needed for nausea.   propranolol 10 MG tablet Commonly known as: INDERAL Take 10 mg by mouth daily as needed (racing heart).   rosuvastatin 20 MG tablet Commonly known as: CRESTOR Take 1 tablet (20 mg total) by mouth daily. What changed: when to take this   traMADol 50 MG tablet Commonly known as: ULTRAM Take 1 tablet (50 mg total) by mouth every 6 (six) hours as needed for moderate pain.   trolamine salicylate 10 % cream Commonly known as: ASPERCREME Apply 1 Application topically daily as needed for muscle pain (Back pain).               Discharge Care Instructions  (From admission, onward)           Start     Ordered   12/11/22 0000  Weight bearing as tolerated        12/11/22 0827   12/11/22 0000  Change dressing       Comments: You have an adhesive waterproof bandage over the incision. Leave this in place until your first follow-up appointment. Once you remove this you will not need to place another bandage.   12/11/22 0827            Diagnostic Studies: CT ABDOMEN PELVIS WO CONTRAST  Result Date: 12/13/2022 CLINICAL DATA:  Abdominal pain. Concern for bowel obstruction. Nausea and vomiting. EXAM: CT ABDOMEN AND PELVIS WITHOUT CONTRAST TECHNIQUE: Multidetector CT imaging of the abdomen and pelvis was performed following the standard protocol without IV contrast. RADIATION DOSE REDUCTION: This exam was performed according to the departmental dose-optimization program which includes automated exposure control, adjustment of the mA and/or kV according to patient size and/or use of iterative reconstruction technique. COMPARISON:  CT abdomen pelvis dated 10/13/2020. FINDINGS: Evaluation of this exam is limited in the absence of intravenous contrast. Evaluation of the pelvic structures is limited due to streak artifact caused by  bilateral hip arthroplasties. Lower chest: Bibasilar subpleural linear atelectasis/scarring. There is a small right pleural effusion. Coronary vascular calcification. No intra-abdominal free air or free fluid. Hepatobiliary: The liver is unremarkable. No biliary dilatation. Probable sludge in the gallbladder. No calcified gallstone or pericholecystic fluid. Pancreas: Unremarkable. No pancreatic ductal dilatation or surrounding inflammatory changes. Spleen: Normal in size without focal abnormality. Adrenals/Urinary Tract: The adrenal glands are unremarkable. There is no hydronephrosis or nephrolithiasis on either side. The visualized ureters and urinary bladder appear unremarkable. Stomach/Bowel: There is sigmoid diverticulosis without active inflammatory changes. Postsurgical changes of the bowel with ileocolic anastomosis in the right upper abdomen. There is no bowel obstruction or active inflammation. Appendectomy. Vascular/Lymphatic: Advanced aortoiliac atherosclerotic disease. The  IVC is unremarkable. No portal venous gas. There is no adenopathy. Reproductive: Hysterectomy.  No adnexal masses. Other: None Musculoskeletal: Osteopenia with degenerative changes of the spine. Bilateral total hip arthroplasties. Nondisplaced fracture of the greater trochanter of the right femur extending into the femoral component of the arthroplasty. IMPRESSION: 1. Nondisplaced fracture of the greater trochanter of the right femur abutting the arthroplasty. 2. No acute intra-abdominal or pelvic pathology. No bowel obstruction. 3. Sigmoid diverticulosis. 4.  Aortic Atherosclerosis (ICD10-I70.0). Electronically Signed   By: Elgie Collard M.D.   On: 12/13/2022 22:43   DG HIP UNILAT WITH PELVIS 1V RIGHT  Result Date: 12/10/2022 CLINICAL DATA:  Elective surgery EXAM: DG HIP (WITH OR WITHOUT PELVIS) 1V RIGHT COMPARISON:  09/06/2021 FINDINGS: Two low resolution intraoperative spot views of the right hip. Total fluoroscopy time was 8  seconds, fluoroscopic dose of 1.019 mGy. Images demonstrate a right hip replacement with normal alignment. Sponge or packing material lateral to the right trochanter. IMPRESSION: Intraoperative fluoroscopic assistance provided during right hip replacement. Electronically Signed   By: Jasmine Pang M.D.   On: 12/10/2022 19:24   DG C-Arm 1-60 Min-No Report  Result Date: 12/10/2022 Fluoroscopy was utilized by the requesting physician.  No radiographic interpretation.   DG C-Arm 1-60 Min-No Report  Result Date: 12/10/2022 Fluoroscopy was utilized by the requesting physician.  No radiographic interpretation.    Disposition: Discharge disposition: 01-Home or Self Care       Discharge Instructions     Call MD / Call 911   Complete by: As directed    If you experience chest pain or shortness of breath, CALL 911 and be transported to the hospital emergency room.  If you develope a fever above 101 F, pus (white drainage) or increased drainage or redness at the wound, or calf pain, call your surgeon's office.   Change dressing   Complete by: As directed    You have an adhesive waterproof bandage over the incision. Leave this in place until your first follow-up appointment. Once you remove this you will not need to place another bandage.   Constipation Prevention   Complete by: As directed    Drink plenty of fluids.  Prune juice may be helpful.  You may use a stool softener, such as Colace (over the counter) 100 mg twice a day.  Use MiraLax (over the counter) for constipation as needed.   Diet - low sodium heart healthy   Complete by: As directed    Do not sit on low chairs, stoools or toilet seats, as it may be difficult to get up from low surfaces   Complete by: As directed    Driving restrictions   Complete by: As directed    No driving for two weeks   Post-operative opioid taper instructions:   Complete by: As directed    POST-OPERATIVE OPIOID TAPER INSTRUCTIONS: It is important to wean  off of your opioid medication as soon as possible. If you do not need pain medication after your surgery it is ok to stop day one. Opioids include: Codeine, Hydrocodone(Norco, Vicodin), Oxycodone(Percocet, oxycontin) and hydromorphone amongst others.  Long term and even short term use of opiods can cause: Increased pain response Dependence Constipation Depression Respiratory depression And more.  Withdrawal symptoms can include Flu like symptoms Nausea, vomiting And more Techniques to manage these symptoms Hydrate well Eat regular healthy meals Stay active Use relaxation techniques(deep breathing, meditating, yoga) Do Not substitute Alcohol to help with tapering If you have been  on opioids for less than two weeks and do not have pain than it is ok to stop all together.  Plan to wean off of opioids This plan should start within one week post op of your joint replacement. Maintain the same interval or time between taking each dose and first decrease the dose.  Cut the total daily intake of opioids by one tablet each day Next start to increase the time between doses. The last dose that should be eliminated is the evening dose.      TED hose   Complete by: As directed    Use stockings (TED hose) for three weeks on both leg(s).  You may remove them at night for sleeping.   Weight bearing as tolerated   Complete by: As directed         Follow-up Information     Aluisio, Homero Fellers, MD Follow up in 2 week(s).   Specialty: Orthopedic Surgery Contact information: [address] [place] [address] [place] [address] [phone]                  Signed: Arther Abbott 12/18/2022, 8:43 AM

## 2023-01-03 ENCOUNTER — Telehealth: Payer: Self-pay | Admitting: Cardiology

## 2023-01-03 DIAGNOSIS — Z79899 Other long term (current) drug therapy: Secondary | ICD-10-CM

## 2023-01-03 DIAGNOSIS — I2510 Atherosclerotic heart disease of native coronary artery without angina pectoris: Secondary | ICD-10-CM

## 2023-01-03 DIAGNOSIS — I10 Essential (primary) hypertension: Secondary | ICD-10-CM

## 2023-01-03 DIAGNOSIS — E118 Type 2 diabetes mellitus with unspecified complications: Secondary | ICD-10-CM

## 2023-01-03 DIAGNOSIS — N1831 Chronic kidney disease, stage 3a: Secondary | ICD-10-CM

## 2023-01-03 DIAGNOSIS — I214 Non-ST elevation (NSTEMI) myocardial infarction: Secondary | ICD-10-CM

## 2023-01-03 DIAGNOSIS — I428 Other cardiomyopathies: Secondary | ICD-10-CM

## 2023-01-03 MED ORDER — FUROSEMIDE 40 MG PO TABS: 40.0000 mg | ORAL_TABLET | Freq: Every day | ORAL | 0 refills | Status: DC

## 2023-01-03 NOTE — Telephone Encounter (Signed)
*  STAT* If patient is at the pharmacy, call can be transferred to refill team.   1. Which medications need to be refilled? (please list name of each medication and dose if known) furosemide (LASIX) 40 MG tablet  2. Which pharmacy/location (including street and city if local pharmacy) is medication to be sent to? [facility] - [place], [place] - [address]  3. Do they need a 30 day or 90 day supply?  90 day supply

## 2023-01-03 NOTE — Telephone Encounter (Signed)
Rx refill sent to pharmacy. 

## 2023-01-28 ENCOUNTER — Other Ambulatory Visit: Payer: Self-pay | Admitting: Cardiology

## 2023-01-28 DIAGNOSIS — I2510 Atherosclerotic heart disease of native coronary artery without angina pectoris: Secondary | ICD-10-CM

## 2023-01-28 DIAGNOSIS — I10 Essential (primary) hypertension: Secondary | ICD-10-CM

## 2023-01-28 DIAGNOSIS — I428 Other cardiomyopathies: Secondary | ICD-10-CM

## 2023-01-28 DIAGNOSIS — Z79899 Other long term (current) drug therapy: Secondary | ICD-10-CM

## 2023-01-28 DIAGNOSIS — I214 Non-ST elevation (NSTEMI) myocardial infarction: Secondary | ICD-10-CM

## 2023-01-28 DIAGNOSIS — N1831 Chronic kidney disease, stage 3a: Secondary | ICD-10-CM

## 2023-01-28 DIAGNOSIS — E118 Type 2 diabetes mellitus with unspecified complications: Secondary | ICD-10-CM

## 2023-02-26 ENCOUNTER — Other Ambulatory Visit: Payer: Self-pay | Admitting: Cardiology

## 2023-02-26 DIAGNOSIS — E118 Type 2 diabetes mellitus with unspecified complications: Secondary | ICD-10-CM

## 2023-02-26 DIAGNOSIS — I10 Essential (primary) hypertension: Secondary | ICD-10-CM

## 2023-02-26 DIAGNOSIS — N1831 Chronic kidney disease, stage 3a: Secondary | ICD-10-CM

## 2023-02-26 DIAGNOSIS — I2510 Atherosclerotic heart disease of native coronary artery without angina pectoris: Secondary | ICD-10-CM

## 2023-02-26 DIAGNOSIS — I428 Other cardiomyopathies: Secondary | ICD-10-CM

## 2023-02-26 DIAGNOSIS — I214 Non-ST elevation (NSTEMI) myocardial infarction: Secondary | ICD-10-CM

## 2023-02-26 DIAGNOSIS — Z79899 Other long term (current) drug therapy: Secondary | ICD-10-CM

## 2023-02-27 ENCOUNTER — Other Ambulatory Visit: Payer: Self-pay | Admitting: Cardiology

## 2023-03-06 ENCOUNTER — Ambulatory Visit (HOSPITAL_BASED_OUTPATIENT_CLINIC_OR_DEPARTMENT_OTHER)
Admission: EM | Admit: 2023-03-06 | Discharge: 2023-03-06 | Disposition: A | Discharge status: Home / Self Care | Payer: Medicare Other | Attending: Internal Medicine | Admitting: Internal Medicine

## 2023-03-06 ENCOUNTER — Encounter (HOSPITAL_BASED_OUTPATIENT_CLINIC_OR_DEPARTMENT_OTHER): Payer: Self-pay

## 2023-03-06 DIAGNOSIS — L03116 Cellulitis of left lower limb: Secondary | ICD-10-CM | POA: Diagnosis not present

## 2023-03-06 DIAGNOSIS — S81802A Unspecified open wound, left lower leg, initial encounter: Secondary | ICD-10-CM | POA: Diagnosis not present

## 2023-03-06 MED ORDER — CEPHALEXIN 250 MG PO CAPS: 250.0000 mg | ORAL_CAPSULE | Freq: Four times a day (QID) | ORAL | 0 refills | Status: DC

## 2023-03-06 NOTE — Discharge Instructions (Addendum)
Continue daily wound care Elevate the leg above the level of the heart to reduce swelling Continue current medications Go to the emergency department for development of fever, worsening swelling, worsening pain, increased redness, chills, flulike symptoms, concerns

## 2023-03-06 NOTE — ED Provider Notes (Signed)
[name]    CSN: [id number] Arrival date & time: 03/06/23  1435      History   Chief Complaint Chief Complaint  Patient presents with   Wound Check    HPI Tiffany Wiggins is a 77 y.o. female.   The history is provided by the patient and a relative.  Wound Check  Has 2 wounds to left shin, fell into a brick step in September, was seen by PCP had lab work done and was referred to [facility].  Her appointment is 1212.  Family has been caring for the wound at home with daily dressing changes.  Family member states yesterday had increased drainage from the wound and area is more red, patient states area more painful and feels hot to the touch.  Denies fever, chills, sweats.  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/09/2021   Primary osteoarthritis of left hip 09/06/2021   Bradycardia 05/23/2021   History of loop recorder 12/31/2019   CAD (coronary artery disease) 12/10/2018   NSTEMI (non-ST elevated myocardial infarction) (HCC) 12/08/2018   Paroxysmal A-fib (HCC)    Senile purpura (HCC) 12/17/2017   Chronic pain of both shoulders 10/01/2017   Moderate late onset Alzheimer's dementia (HCC) 09/16/2017   Stage 3a chronic kidney disease (HCC) 05/29/2017   Vitamin B12 deficiency 03/20/2017   GERD without esophagitis 01/24/2017   Degenerative lumbar disc 01/24/2017   Bilateral  carotid bruits 01/24/2017   Atherosclerosis of both carotid arteries 01/24/2017   History of CVA (cerebrovascular accident) 01/24/2017   High risk medication use 01/24/2017   Osteoarthritis of multiple joints 01/24/2017   Vitamin D deficiency 01/24/2017   Malaise and fatigue 01/24/2017   Pain of left hip joint 01/14/2017   Unilateral primary osteoarthritis, left hip 01/14/2017   Cough 02/15/2016   Wheezing 02/15/2016   Perforation of cecum s/p R hemicolectomy with anastamosis (08/25/2015) 08/26/2015   Preop cardiovascular exam 07/27/2015   Thoracic back pain 04/25/2015   Chronic venous insufficiency 12/21/2014   Hip arthritis 08/10/2014   Breast pain, right 07/22/2014   Thyroid nodule 05/01/2014   PALPITATIONS, CHRONIC 12/08/2009   HIP PAIN, BILATERAL 09/15/2009   OVERWEIGHT 03/08/2008   Cerebral artery occlusion with cerebral infarction (HCC) 03/08/2008   Hyperlipemia 01/30/2007   Essential hypertension 01/30/2007   Diabetes mellitus type 2, controlled, with complications (HCC) 01/30/2007    Past Surgical History:  Procedure Laterality Date   ABDOMINAL HYSTERECTOMY     BIOPSY THYROID  04/2014   COLON SURGERY     LAPAROSCOPY N/A 08/25/2015   Procedure: EXPLORATORY LAPAROTOMY, ILEOCECECTOMY, PRIMARY ANASTOMOSIS, LYSIS OF ADHESIONS;  Surgeon: Chevis Pretty III, MD;  Location: [facility];  Service: General;  Laterality: N/A;   laparoscopy for fertility work up  LEFT HEART CATH AND CORONARY ANGIOGRAPHY N/A 09/11/2021   Procedure: LEFT HEART CATH AND CORONARY ANGIOGRAPHY;  Surgeon: Yvonne Kendall, MD;  Location: [facility];  Service: Cardiovascular;  Laterality: N/A;   LOOP RECORDER IMPLANT N/A 03/24/2014   Procedure: LOOP RECORDER IMPLANT;  Surgeon: Duke Salvia, MD;  Location: [facility] C[facility];  Service: Cardiovascular;  Laterality: N/A;   LUMBAR LAMINECTOMY/DECOMPRESSION MICRODISCECTOMY  04/25/2011   Procedure: LUMBAR LAMINECTOMY/DECOMPRESSION MICRODISCECTOMY;  Surgeon: Karn Cassis, MD;  Location: [facility];  Service: Neurosurgery;  Laterality: Right;  Right Lumbar Four-Five Discectomy   OOPHORECTOMY     TONSILLECTOMY     TOTAL HIP ARTHROPLASTY Left 09/06/2021   Procedure: TOTAL HIP ARTHROPLASTY ANTERIOR APPROACH;  Surgeon: Ollen Gross, MD;  Location: [facility];  Service: Orthopedics;  Laterality: Left;   TOTAL HIP ARTHROPLASTY Right 12/10/2022   Procedure: TOTAL HIP ARTHROPLASTY ANTERIOR APPROACH;  Surgeon: Ollen Gross, MD;  Location: [facility];  Service: Orthopedics;  Laterality: Right;    OB History   No obstetric history on file.      Home Medications    Prior to Admission medications   Medication Sig Start Date End Date Taking? Authorizing Provider  cephALEXin (KEFLEX) 250 MG capsule Take 1 capsule (250 mg total) by mouth 4 (four) times daily. 03/06/23  Yes Meliton Rattan, PA  QUEtiapine (SEROQUEL) 25 MG tablet Take 25 mg by mouth at bedtime.   Yes [provider]  acetaminophen (TYLENOL) 650 MG CR tablet Take 650 mg by mouth 2 (two) times daily.    [provider]  amLODipine (NORVASC) 5 MG tablet Take 1 tablet (5 mg total) by mouth daily. Patient needs an appointment for further refills. 3 rd/final attempt 02/27/23   Georgeanna Lea, MD  apixaban (ELIQUIS) 5 MG TABS tablet Take 1 tablet (5 mg total) by mouth 2 (two) times daily. 12/10/18   Burnadette Pop, MD  azelastine (ASTELIN) 0.1 % nasal spray Place 2 sprays into both nostrils daily as needed for rhinitis or allergies. 05/10/20   [provider]  furosemide (LASIX) 40 MG tablet Take 1 tablet (40 mg total) by mouth daily. Final attempt, patient needs and appt for additional refills 02/26/23   Georgeanna Lea, MD  isosorbide mononitrate (IMDUR) 60 MG 24 hr tablet Take 1 tablet (60 mg total) by mouth daily. 11/30/22   Georgeanna Lea, MD  losartan (COZAAR) 25 MG tablet Take 25 mg by mouth daily. 10/18/22   [provider]  losartan (COZAAR) 50 MG tablet Take  1 tablet by mouth once daily Patient not taking: Reported on 11/22/2022 06/19/21   Duke Salvia, MD  Magnesium 250 MG TABS Take 250 mg by mouth daily.    [provider]  methocarbamol (ROBAXIN) 500 MG tablet Take 1 tablet (500 mg total) by mouth every 6 (six) hours as needed for muscle spasms. 12/11/22   Edmisten, Lyn Hollingshead, PA  nitroGLYCERIN (NITROSTAT) 0.4 MG SL tablet Place 1 tablet (0.4 mg total) under the tongue every 5 (five) minutes as needed for chest pain. 07/06/21   Georgeanna Lea, MD  ondansetron (ZOFRAN) 4 MG tablet Take 1 tablet (4 mg total) by mouth every 6 (six) hours as needed for nausea. 12/11/22   Edmisten, Lyn Hollingshead, PA  propranolol (INDERAL) 10 MG tablet Take 10 mg by mouth daily as needed (racing heart).    [provider]  rosuvastatin (CRESTOR) 20 MG tablet Take 1 tablet (20 mg total) by mouth  daily. Patient taking differently: Take 20 mg by mouth every evening. 09/13/21 12/10/22  Narda Bonds, MD  traMADol (ULTRAM) 50 MG tablet Take 1 tablet (50 mg total) by mouth every 6 (six) hours as needed for moderate pain. 12/11/22   Edmisten, Lyn Hollingshead, PA  trolamine salicylate (ASPERCREME) 10 % cream Apply 1 Application topically daily as needed for muscle pain (Back pain).    [provider]    Family History Family History  Problem Relation Age of Onset   Cancer Sister        lung   Diabetes Sister    Cancer Brother        lung   Colon cancer Paternal Uncle     Social History Social History   Tobacco Use   Smoking status: Former    Current packs/day: 0.00    Types: Cigarettes    Quit date: 04/02/2004    Years since quitting: 18.9   Smokeless tobacco: Never  Vaping Use   Vaping status: Never Used  Substance Use Topics   Alcohol use: Yes    Alcohol/week: 2.0 standard drinks of alcohol    Types: 2 Glasses of wine per week   Drug use: No     Allergies   Codeine, Morphine and codeine, Ativan [lorazepam], Dilaudid [hydromorphone], and  Statins   Review of Systems Review of Systems   Physical Exam Triage Vital Signs ED Triage Vitals  Encounter Vitals Group     BP 03/06/23 1504 (!) 172/72     Systolic BP Percentile --      Diastolic BP Percentile --      Pulse Rate 03/06/23 1504 60     Resp 03/06/23 1504 18     Temp 03/06/23 1504 (!) 97.3 F (36.3 C)     Temp Source 03/06/23 1504 Oral     SpO2 03/06/23 1504 98 %     Weight --      Height --      Head Circumference --      Peak Flow --      Pain Score 03/06/23 1505 6     Pain Loc --      Pain Education --      Exclude from Growth Chart --    No data found.  Updated Vital Signs BP (!) 172/72 (BP Location: Right Arm)   Pulse 60   Temp (!) 97.3 F (36.3 C) (Oral)   Resp 18   SpO2 98%   Visual Acuity Right Eye Distance:   Left Eye Distance:   Bilateral Distance:    Right Eye Near:   Left Eye Near:    Bilateral Near:     Physical Exam Musculoskeletal:     Right lower leg: Edema present.     Left lower leg: Edema present.  Skin:    Findings: Erythema and lesion (Anterior left lower leg has 2 open wounds 1 approximately 4 cm, erythematous without drainage, 1 oval 2 x 3 cm with exudate there is chronic appearing hyperpigmentation surrounding this area, there is mild erythema and warmth,  no red streaks) present.  Neurological:     Mental Status: She is alert.      UC Treatments / Results  Labs (all labs ordered are listed, but only abnormal results are displayed) Labs Reviewed - No data to display  EKG   Radiology No results found.  Procedures Procedures (including critical care time)  Medications Ordered in UC Medications - No data to display  Initial Impression / Assessment and Plan / UC Course  I have reviewed the triage vital signs and the nursing notes.  Pertinent labs & imaging results that were available during my care of the patient were reviewed by me and considered in my medical decision making (see chart for  details).     77 year old female with chronic wounds to left lower leg for greater than 2 months, awaiting her appointment with wound care center.  Family notes increased drainage, patient notes increased pain and redness.    Patient has had poor kidney function in the past initiate cephalexin, family counseled to continue current care including daily wound changes, elevate legs, continue diuretics to reduce bilateral edema, go to ED for fever, shaking chills, increased redness, increased pain, concerns.  By PCP in 48 hours Final Clinical Impressions(s) / UC Diagnoses   Final diagnoses:  Open wound of left lower leg, initial encounter  Cellulitis of left lower extremity     Discharge Instructions      Continue daily wound care Elevate the leg above the level of the heart to reduce swelling Continue current medications Go to the emergency department for development of fever, worsening swelling, worsening pain, increased redness, chills, flulike symptoms, concerns   ED Prescriptions     Medication Sig Dispense Auth. Provider   cephALEXin (KEFLEX) 250 MG capsule Take 1 capsule (250 mg total) by mouth 4 (four) times daily. 28 capsule Meliton Rattan, [place]      PDMP not reviewed this encounter.   Meliton Rattan, [place] 03/06/23 1541

## 2023-03-06 NOTE — ED Triage Notes (Signed)
Patient tripped in September. Wound to left shin area. Wound had a scab that came off and now appears infected. Large bandaid in place which appears to have drainage that has saturated the dressing. Dressing removed. 2 wounds visible. Skin pink around outer diameter.

## 2023-03-25 ENCOUNTER — Other Ambulatory Visit: Payer: Self-pay | Admitting: Cardiology

## 2023-04-01 ENCOUNTER — Other Ambulatory Visit: Payer: Self-pay | Admitting: Internal Medicine

## 2023-04-01 ENCOUNTER — Other Ambulatory Visit: Payer: Self-pay | Admitting: Cardiology

## 2023-04-01 MED ORDER — PROPRANOLOL HCL 10 MG PO TABS: ORAL_TABLET | ORAL | 5 refills | Status: DC

## 2023-06-06 ENCOUNTER — Other Ambulatory Visit: Payer: Self-pay

## 2023-06-06 ENCOUNTER — Encounter (HOSPITAL_BASED_OUTPATIENT_CLINIC_OR_DEPARTMENT_OTHER): Payer: Self-pay | Admitting: Emergency Medicine

## 2023-06-06 ENCOUNTER — Ambulatory Visit (HOSPITAL_BASED_OUTPATIENT_CLINIC_OR_DEPARTMENT_OTHER)
Admission: EM | Admit: 2023-06-06 | Discharge: 2023-06-06 | Disposition: A | Discharge status: Home / Self Care | Attending: Family Medicine | Admitting: Family Medicine

## 2023-06-06 DIAGNOSIS — M533 Sacrococcygeal disorders, not elsewhere classified: Secondary | ICD-10-CM | POA: Diagnosis not present

## 2023-06-06 MED ORDER — TRAMADOL HCL 50 MG PO TABS: 50.0000 mg | ORAL_TABLET | Freq: Four times a day (QID) | ORAL | 0 refills | Status: AC | PRN

## 2023-06-06 NOTE — Discharge Instructions (Signed)
 I am giving you enough pain medication to last for a week.  Please follow-up with your doctor for further management on Monday

## 2023-06-06 NOTE — ED Provider Notes (Signed)
 [facility]    CSN: [id number] Arrival date & time: 06/06/23  1620      History   Chief Complaint Chief Complaint  Patient presents with   Medication Refill    HPI Tiffany Wiggins is a 78 y.o. female.   78 year old female presents today for medication refill.  She is accompanied here by her husband due to past medical history of Alzheimer's.  Per husband had a fall approximately 2 weeks ago with injury to buttocks area.  She is continue to have pain in this area.  She was taking tramadol which was helping with pain.  She is unable to get into her primary care for refill until next week.   Medication Refill   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/09/2021   Primary osteoarthritis of left hip 09/06/2021   Bradycardia 05/23/2021   History of loop recorder 12/31/2019   CAD (coronary artery disease) 12/10/2018   NSTEMI (non-ST elevated myocardial infarction) (HCC) 12/08/2018   Paroxysmal A-fib (HCC)    Senile purpura (HCC) 12/17/2017   Chronic pain of both shoulders 10/01/2017   Moderate late onset Alzheimer's dementia (HCC) 09/16/2017   Stage 3a chronic kidney disease (HCC) 05/29/2017   Vitamin B12 deficiency 03/20/2017   GERD without esophagitis 01/24/2017   Degenerative lumbar disc 01/24/2017   Bilateral carotid bruits 01/24/2017   Atherosclerosis of both carotid arteries 01/24/2017    History of CVA (cerebrovascular accident) 01/24/2017   High risk medication use 01/24/2017   Osteoarthritis of multiple joints 01/24/2017   Vitamin D deficiency 01/24/2017   Malaise and fatigue 01/24/2017   Pain of left hip joint 01/14/2017   Unilateral primary osteoarthritis, left hip 01/14/2017   Cough 02/15/2016   Wheezing 02/15/2016   Perforation of cecum s/p R hemicolectomy with anastamosis (08/25/2015) 08/26/2015   Preop cardiovascular exam 07/27/2015   Thoracic back pain 04/25/2015   Chronic venous insufficiency 12/21/2014   Hip arthritis 08/10/2014   Breast pain, right 07/22/2014   Thyroid nodule 05/01/2014   PALPITATIONS, CHRONIC 12/08/2009   HIP PAIN, BILATERAL 09/15/2009   OVERWEIGHT 03/08/2008   Cerebral artery occlusion with cerebral infarction (HCC) 03/08/2008   Hyperlipemia 01/30/2007   Essential hypertension 01/30/2007   Diabetes mellitus type 2, controlled, with complications (HCC) 01/30/2007    Past Surgical History:  Procedure Laterality Date   ABDOMINAL HYSTERECTOMY     BIOPSY THYROID  04/2014   COLON SURGERY     LAPAROSCOPY N/A 08/25/2015   Procedure: EXPLORATORY LAPAROTOMY, ILEOCECECTOMY, PRIMARY ANASTOMOSIS, LYSIS OF ADHESIONS;  Surgeon: Chevis Pretty III, MD;  Location: [facility];  Service: General;  Laterality: N/A;   laparoscopy for fertility work up     LEFT HEART CATH AND CORONARY ANGIOGRAPHY N/A 09/11/2021   Procedure: LEFT HEART CATH AND CORONARY ANGIOGRAPHY;  Surgeon:  End, Cristal Deer, MD;  Location: [facility];  Service: Cardiovascular;  Laterality: N/A;   LOOP RECORDER IMPLANT N/A 03/24/2014   Procedure: LOOP RECORDER IMPLANT;  Surgeon: Duke Salvia, MD;  Location: [facility] CATH LAB;  Service: Cardiovascular;  Laterality: N/A;   LUMBAR LAMINECTOMY/DECOMPRESSION MICRODISCECTOMY  04/25/2011   Procedure: LUMBAR LAMINECTOMY/DECOMPRESSION MICRODISCECTOMY;  Surgeon: Karn Cassis, MD;  Location: [facility];  Service: Neurosurgery;  Laterality: Right;   Right Lumbar Four-Five Discectomy   OOPHORECTOMY     TONSILLECTOMY     TOTAL HIP ARTHROPLASTY Left 09/06/2021   Procedure: TOTAL HIP ARTHROPLASTY ANTERIOR APPROACH;  Surgeon: Ollen Gross, MD;  Location: [facility];  Service: Orthopedics;  Laterality: Left;   TOTAL HIP ARTHROPLASTY Right 12/10/2022   Procedure: TOTAL HIP ARTHROPLASTY ANTERIOR APPROACH;  Surgeon: Ollen Gross, MD;  Location: [facility];  Service: Orthopedics;  Laterality: Right;    OB History   No obstetric history on file.      Home Medications    Prior to Admission medications   Medication Sig Start Date End Date Taking? Authorizing Provider  acetaminophen (TYLENOL) 650 MG CR tablet Take 650 mg by mouth 2 (two) times daily.    [provider]  amLODipine (NORVASC) 5 MG tablet Take 1 tablet (5 mg total) by mouth daily. Patient needs an appointment for further refills. 3 rd/final attempt 02/27/23   Georgeanna Lea, MD  apixaban (ELIQUIS) 5 MG TABS tablet Take 1 tablet (5 mg total) by mouth 2 (two) times daily. 12/10/18   Burnadette Pop, MD  azelastine (ASTELIN) 0.1 % nasal spray Place 2 sprays into both nostrils daily as needed for rhinitis or allergies. 05/10/20   [provider]  cephALEXin (KEFLEX) 250 MG capsule Take 1 capsule (250 mg total) by mouth 4 (four) times daily. 03/06/23   Meliton Rattan, PA  furosemide (LASIX) 40 MG tablet Take 1 tablet (40 mg total) by mouth daily. Final attempt, patient needs and appt for additional refills 02/26/23   Georgeanna Lea, MD  isosorbide mononitrate (IMDUR) 60 MG 24 hr tablet Take 1 tablet (60 mg total) by mouth daily. 11/30/22   Georgeanna Lea, MD  losartan (COZAAR) 25 MG tablet Take 25 mg by mouth daily. 10/18/22   [provider]  losartan (COZAAR) 50 MG tablet Take 1 tablet by mouth once daily Patient not taking: Reported on 11/22/2022 06/19/21   Duke Salvia, MD  Magnesium 250 MG TABS Take 250 mg by mouth daily.    [provider]   methocarbamol (ROBAXIN) 500 MG tablet Take 1 tablet (500 mg total) by mouth every 6 (six) hours as needed for muscle spasms. 12/11/22   Edmisten, Kristie L, PA  nitroGLYCERIN (NITROSTAT) 0.4 MG SL tablet DISSOLVE ONE TABLET UNDER THE TONGUE EVERY 5 MINUTES AS NEEDED FOR CHEST PAIN.  DO NOT EXCEED A TOTAL OF 3 DOSES IN 15 MINUTES 04/01/23   Georgeanna Lea, MD  ondansetron (ZOFRAN) 4 MG tablet Take 1 tablet (4 mg total) by mouth every 6 (six) hours as needed for nausea. 12/11/22   Edmisten, Lyn Hollingshead, PA  propranolol (INDERAL) 10 MG tablet Take 1 tablet by mouth daily as needed for racing heart 04/01/23   Duke Salvia, MD  QUEtiapine (SEROQUEL) 25 MG tablet Take 25 mg by mouth at bedtime.    [provider]  rosuvastatin (CRESTOR) 20 MG tablet Take 1 tablet (20 mg total) by mouth daily. Patient taking differently: Take 20 mg by mouth  every evening. 09/13/21 12/10/22  Narda Bonds, MD  traMADol (ULTRAM) 50 MG tablet Take 1 tablet (50 mg total) by mouth every 6 (six) hours as needed for up to 7 days for moderate pain (pain score 4-6). 06/06/23 06/13/23  Dahlia Byes A, FNP  trolamine salicylate (ASPERCREME) 10 % cream Apply 1 Application topically daily as needed for muscle pain (Back pain).    [provider]    Family History Family History  Problem Relation Age of Onset   Cancer Sister        lung   Diabetes Sister    Cancer Brother        lung   Colon cancer Paternal Uncle     Social History Social History   Tobacco Use   Smoking status: Former    Current packs/day: 0.00    Types: Cigarettes    Quit date: 04/02/2004    Years since quitting: 19.1   Smokeless tobacco: Never  Vaping Use   Vaping status: Never Used  Substance Use Topics   Alcohol use: Yes    Alcohol/week: 2.0 standard drinks of alcohol    Types: 2 Glasses of wine per week   Drug use: No     Allergies   Codeine, Morphine and codeine, Ativan [lorazepam], Dilaudid [hydromorphone], and  Statins   Review of Systems Review of Systems  Musculoskeletal:  Positive for back pain.     Physical Exam Triage Vital Signs ED Triage Vitals  Encounter Vitals Group     BP 06/06/23 1630 (!) 171/82     Systolic BP Percentile --      Diastolic BP Percentile --      Pulse Rate 06/06/23 1630 (!) 56     Resp 06/06/23 1630 18     Temp 06/06/23 1630 98 F (36.7 C)     Temp Source 06/06/23 1630 Oral     SpO2 06/06/23 1630 94 %     Weight --      Height --      Head Circumference --      Peak Flow --      Pain Score 06/06/23 1631 10     Pain Loc --      Pain Education --      Exclude from Growth Chart --    No data found.  Updated Vital Signs BP (!) 171/82 (BP Location: Right Arm)   Pulse (!) 56   Temp 98 F (36.7 C) (Oral)   Resp 18   SpO2 94%   Visual Acuity Right Eye Distance:   Left Eye Distance:   Bilateral Distance:    Right Eye Near:   Left Eye Near:    Bilateral Near:     Physical Exam Constitutional:      Appearance: Normal appearance.  Pulmonary:     Effort: Pulmonary effort is normal.  Musculoskeletal:        General: Tenderness present.  Skin:    General: Skin is warm and dry.  Neurological:     Mental Status: She is alert. Mental status is at baseline.      UC Treatments / Results  Labs (all labs ordered are listed, but only abnormal results are displayed) Labs Reviewed - No data to display  EKG   Radiology No results found.  Procedures Procedures (including critical care time)  Medications Ordered in UC Medications - No data to display  Initial Impression / Assessment and Plan / UC Course  I have reviewed the  triage vital signs and the nursing notes.  Pertinent labs & imaging results that were available during my care of the patient were reviewed by me and considered in my medical decision making (see chart for details).     Pain in coccyx due to fall Refill provided for tramadol for 7 days until she can follow-up with  her primary care and receive further management. Final Clinical Impressions(s) / UC Diagnoses   Final diagnoses:  Pain in the coccyx     Discharge Instructions      I am giving you enough pain medication to last for a week.  Please follow-up with your doctor for further management on Monday    ED Prescriptions     Medication Sig Dispense Auth. Provider   traMADol (ULTRAM) 50 MG tablet Take 1 tablet (50 mg total) by mouth every 6 (six) hours as needed for up to 7 days for moderate pain (pain score 4-6). 28 tablet English Craighead A, FNP      I have reviewed the PDMP during this encounter.   Janace Aris, FNP 06/06/23 [phone]

## 2023-06-06 NOTE — ED Triage Notes (Signed)
 Pt states she had a fall few weeks ago and when to the hospital had x rays and CT scan done with negative broken bones. Pt states they prescribed  Tramadol for pain with some relief, but she ran out of medication and her tailbone still hurting her a lot. Pt is requesting a med refill.

## 2023-09-17 ENCOUNTER — Other Ambulatory Visit: Payer: Self-pay | Admitting: Internal Medicine

## 2023-09-17 DIAGNOSIS — Z1231 Encounter for screening mammogram for malignant neoplasm of breast: Secondary | ICD-10-CM

## 2023-09-26 ENCOUNTER — Ambulatory Visit
Admission: RE | Admit: 2023-09-26 | Discharge: 2023-09-26 | Disposition: A | Discharge status: Home / Self Care | Source: Ambulatory Visit | Attending: Internal Medicine | Admitting: Internal Medicine

## 2023-09-26 DIAGNOSIS — Z1231 Encounter for screening mammogram for malignant neoplasm of breast: Secondary | ICD-10-CM

## 2023-10-02 ENCOUNTER — Other Ambulatory Visit: Payer: Self-pay | Admitting: Internal Medicine

## 2023-11-28 ENCOUNTER — Other Ambulatory Visit: Payer: Self-pay | Admitting: Internal Medicine

## 2023-12-20 ENCOUNTER — Other Ambulatory Visit: Payer: Self-pay | Admitting: Cardiology

## 2024-01-19 ENCOUNTER — Other Ambulatory Visit: Payer: Self-pay | Admitting: Cardiology

## 2024-01-28 ENCOUNTER — Telehealth: Payer: Self-pay | Admitting: *Deleted

## 2024-01-28 NOTE — Telephone Encounter (Signed)
   Name: Tiffany Wiggins  DOB: 1945/11/16  MRN: [id number]  Primary Cardiologist: Lamar Fitch, MD  Chart reviewed as part of pre-operative protocol coverage. Because of [name]'s past medical history and time since last visit, she will require a follow-up in-office visit in order to better assess preoperative cardiovascular risk.  Pre-op covering staff: - Please schedule appointment and call patient to inform them. If patient already had an upcoming appointment within acceptable timeframe, please add pre-op clearance to the appointment notes so provider is aware. - Please contact requesting surgeon's office via preferred method (i.e, phone, fax) to inform them of need for appointment prior to surgery.  This message will also be routed to pharmacy pool for input on holding Eliquis  as requested below so that this information is available to the clearing provider at time of patient's appointment.   Lum LITTIE Louis, NP  01/28/2024, 1:21 PM

## 2024-01-28 NOTE — Telephone Encounter (Signed)
 I called and s/w the pt and her husband about in office appt for preop clearance. I reviewed [place] office schedule and could not find an appt with Dr. Bernie any time soon and preop request was marked Urgent.   I assured the pt and her husband that I will ask the [facility] scheduling team call them with an appt.   Pt's husband asked to please call the [phone].

## 2024-01-28 NOTE — Telephone Encounter (Signed)
   Pre-operative Risk Assessment    Patient Name: Tiffany Wiggins  DOB: 1945/10/24 MRN: [id number]      Request for Surgical Clearance    Procedure:  BIL L2 - L4 MBB  Date of Surgery:  Clearance TBD                                 Surgeon:  Dr. Deatrice Manus Surgeon's Group or Practice Name:  [facility] Phone number:  [phone] Fax number:  [phone]   Type of Clearance Requested:   - Medical  - Pharmacy:  Hold Apixaban  (Eliquis ) Stop for 3 days prior to surgery and resume the next day.   Type of Anesthesia:  Not Indicated   Additional requests/questions:  This clearance was marked as STAT  Signed, Tymesha, [place]   01/28/2024, 12:53 PM

## 2024-01-29 ENCOUNTER — Ambulatory Visit: Attending: Cardiology | Admitting: Cardiology

## 2024-01-29 ENCOUNTER — Ambulatory Visit: Attending: Cardiology

## 2024-01-29 ENCOUNTER — Encounter: Payer: Self-pay | Admitting: Cardiology

## 2024-01-29 VITALS — BP 126/60 | HR 50 | Ht 66.0 in | Wt 145.0 lb

## 2024-01-29 DIAGNOSIS — I10 Essential (primary) hypertension: Secondary | ICD-10-CM | POA: Diagnosis not present

## 2024-01-29 DIAGNOSIS — M47814 Spondylosis without myelopathy or radiculopathy, thoracic region: Secondary | ICD-10-CM | POA: Insufficient documentation

## 2024-01-29 DIAGNOSIS — G301 Alzheimer's disease with late onset: Secondary | ICD-10-CM

## 2024-01-29 DIAGNOSIS — F02B11 Dementia in other diseases classified elsewhere, moderate, with agitation: Secondary | ICD-10-CM

## 2024-01-29 DIAGNOSIS — Z01810 Encounter for preprocedural cardiovascular examination: Secondary | ICD-10-CM | POA: Diagnosis not present

## 2024-01-29 DIAGNOSIS — Z01818 Encounter for other preprocedural examination: Secondary | ICD-10-CM

## 2024-01-29 DIAGNOSIS — I2510 Atherosclerotic heart disease of native coronary artery without angina pectoris: Secondary | ICD-10-CM

## 2024-01-29 DIAGNOSIS — I480 Paroxysmal atrial fibrillation: Secondary | ICD-10-CM

## 2024-01-29 DIAGNOSIS — R0609 Other forms of dyspnea: Secondary | ICD-10-CM

## 2024-01-29 NOTE — Progress Notes (Signed)
 Cardiology Office Note:    Date:  01/29/2024   ID:  Tiffany Wiggins, DOB 1945-08-12, MRN [id number]  PCP:  Thurmond Cathlyn LABOR., MD  Cardiologist:  Lamar Fitch, MD    Referring MD: Thurmond Cathlyn LABOR., MD   Chief Complaint  Patient presents with   Pre-op Exam    History of Present Illness:    Tiffany Wiggins is a 78 y.o. female past medical history significant for coronary artery disease, coronary CT angio done 5 years ago showed calcified vessel, fractional flow reserve was negative, then I did see her in 2023 for evaluation before elective hip replacement.  She end up going to hip surgery after that and did have a spell of troponin, cardiac catheterization done thereafter showed luminal disease.  Since that time she is doing poorly, she does have proximal of atrial fibrillation, history of CVA, she is being anticoagulated but no recent recurrences of atrial fibrillation, she is here today because she is scheduled to have injection to her back for chronic pain.  Additional problem include diabetes mellitus, hyperlipidemia, cardiac artery stenosis followed by vascular surgeons from [facility].  It somewhat difficult to talk to her she is slow in responses her husband is with her in the room.  He described the fact that she does not do much.  Very sedentary lifestyle.  Past Medical History:  Diagnosis Date   Anemia    Anxiety    Arthritis    Carotid stenosis, bilateral    CVA (cerebral vascular accident) (HCC) 2019   Diabetes mellitus type 2 in nonobese (HCC)    Hyperlipidemia    Hypertension    PCP GAVE FOR HER  BLOOD PRESSURE   IBS (irritable bowel syndrome)    Memory deficit    PUD (peptic ulcer disease)    TIA (transient ischemic attack) 2006    Past Surgical History:  Procedure Laterality Date   ABDOMINAL HYSTERECTOMY     BIOPSY THYROID   04/2014   COLON SURGERY     LAPAROSCOPY N/A 08/25/2015   Procedure: EXPLORATORY LAPAROTOMY, ILEOCECECTOMY, PRIMARY ANASTOMOSIS, LYSIS OF  ADHESIONS;  Surgeon: Deward Null III, MD;  Location: [facility] ORS;  Service: General;  Laterality: N/A;   laparoscopy for fertility work up     LEFT HEART CATH AND CORONARY ANGIOGRAPHY N/A 09/11/2021   Procedure: LEFT HEART CATH AND CORONARY ANGIOGRAPHY;  Surgeon: Mady Bruckner, MD;  Location: [facility] INVASIVE CV LAB;  Service: Cardiovascular;  Laterality: N/A;   LOOP RECORDER IMPLANT N/A 03/24/2014   Procedure: LOOP RECORDER IMPLANT;  Surgeon: Elspeth JAYSON Sage, MD;  Location: [facility] CATH LAB;  Service: Cardiovascular;  Laterality: N/A;   LUMBAR LAMINECTOMY/DECOMPRESSION MICRODISCECTOMY  04/25/2011   Procedure: LUMBAR LAMINECTOMY/DECOMPRESSION MICRODISCECTOMY;  Surgeon: Catalina CHRISTELLA Stains, MD;  Location: [facility] NEURO ORS;  Service: Neurosurgery;  Laterality: Right;  Right Lumbar Four-Five Discectomy   OOPHORECTOMY     TONSILLECTOMY     TOTAL HIP ARTHROPLASTY Left 09/06/2021   Procedure: TOTAL HIP ARTHROPLASTY ANTERIOR APPROACH;  Surgeon: Melodi Lerner, MD;  Location: [facility] ORS;  Service: Orthopedics;  Laterality: Left;   TOTAL HIP ARTHROPLASTY Right 12/10/2022   Procedure: TOTAL HIP ARTHROPLASTY ANTERIOR APPROACH;  Surgeon: Melodi Lerner, MD;  Location: [facility] OR[facility];  Service: Orthopedics;  Laterality: Right;    Current Medications: Current Meds  Medication Sig   acetaminophen  (TYLENOL ) 650 MG CR tablet Take 650 mg by mouth 2 (two) times daily.   amLODipine  (NORVASC ) 5 MG tablet Take 1 tablet (5 mg total) by mouth daily.  Patient needs an appointment for further refills. 3 rd/final attempt   apixaban  (ELIQUIS ) 5 MG TABS tablet Take 1 tablet (5 mg total) by mouth 2 (two) times daily.   azelastine  (ASTELIN ) 0.1 % nasal spray Place 2 sprays into both nostrils daily as needed for rhinitis or allergies.   furosemide  (LASIX ) 40 MG tablet Take 1 tablet (40 mg total) by mouth daily. Final attempt, patient needs and appt for additional refills   isosorbide  mononitrate (IMDUR ) 60 MG 24 hr tablet TAKE 1 TABLET BY MOUTH ONCE DAILY. NEED  APPOINTMENT FOR FUTURE REFILLS   losartan  (COZAAR ) 50 MG tablet Take 1 tablet by mouth once daily   Magnesium  250 MG TABS Take 250 mg by mouth daily.   nitroGLYCERIN  (NITROSTAT ) 0.4 MG SL tablet DISSOLVE ONE TABLET UNDER THE TONGUE EVERY 5 MINUTES AS NEEDED FOR CHEST PAIN.  DO NOT EXCEED A TOTAL OF 3 DOSES IN 15 MINUTES   propranolol  (INDERAL ) 10 MG tablet TAKE 1 TABLET BY MOUTH ONCE DAILY AS NEEDED FOR  RACING  HEART  **PATIENT  NEEDS  AN  APPOINTMENT  FOR  ADDITIONAL  REFILLS**   rosuvastatin  (CRESTOR ) 20 MG tablet Take 1 tablet (20 mg total) by mouth daily.   trolamine salicylate (ASPERCREME) 10 % cream Apply 1 Application topically daily as needed for muscle pain (Back pain).     Allergies:   Codeine , Morphine  and codeine , Ativan  [lorazepam ], Dilaudid  [hydromorphone ], and Statins   Social History   Socioeconomic History   Marital status: Married    Spouse name: Not on file   Number of children: 0   Years of education: Not on file   Highest education level: Not on file  Occupational History   Occupation: retired  Tobacco Use   Smoking status: Former    Current packs/day: 0.00    Types: Cigarettes    Quit date: 04/02/2004    Years since quitting: 19.8   Smokeless tobacco: Never  Vaping Use   Vaping status: Never Used  Substance and Sexual Activity   Alcohol use: Yes    Alcohol/week: 2.0 standard drinks of alcohol    Types: 2 Glasses of wine per week   Drug use: No   Sexual activity: Not Currently  Other Topics Concern   Not on file  Social History Narrative   Not on file   Social Drivers of Health   Financial Resource Strain: Not on file  Food Insecurity: Low Risk  (12/13/2022)   Received from [facility]   Hunger Vital Sign    Within the past 12 months, you worried that your food would run out before you got money to buy more: Never true    Within the past 12 months, the food you bought just didn't last and you didn't have money to get more. : Never true   Transportation Needs: No Transportation Needs (12/13/2022)   Received from [facility]    In the past 12 months, has lack of reliable transportation kept you from medical appointments, meetings, work or from getting things needed for daily living? : No  Physical Activity: Unknown (08/29/2021)   Received from [facility], [facility] visits prior to 06/02/2022.   Exercise Vital Sign    On average, how many days per week do you engage in moderate to strenuous exercise (like a brisk walk)?: 0 days    Minutes of Exercise per Session: Not on file  Stress: Not on file  Social Connections: Not  on file     Family History: The patient's family history includes Cancer in her brother and sister; Colon cancer in her paternal uncle; Diabetes in her sister. ROS:   Please see the history of present illness.    All 14 point review of systems negative except as described per history of present illness  EKGs/Labs/Other Studies Reviewed:    EKG Interpretation Date/Time:  Wednesday January 29 2024 09:10:49 EDT Ventricular Rate:  50 PR Interval:  144 QRS Duration:  74 QT Interval:  450 QTC Calculation: 410 R Axis:   3  Text Interpretation: Sinus bradycardia Septal infarct , age undetermined When compared with ECG of 20-Sep-2022 16:28, Septal infarct is now Present Confirmed by Bernie Charleston [phone]) on 01/29/2024 9:18:38 AM    Recent Labs: No results found for requested labs within last 365 days.  Recent Lipid Panel    Component Value Date/Time   CHOL 159 09/09/2021 0318   TRIG 71 09/09/2021 0318   HDL 81 09/09/2021 0318   CHOLHDL 2.0 09/09/2021 0318   VLDL 14 09/09/2021 0318   LDLCALC 64 09/09/2021 0318   LDLDIRECT 167.0 07/19/2015 1108    Physical Exam:    VS:  BP 126/60   Pulse (!) 50   Ht 5' 6 (1.676 m)   Wt 145 lb (65.8 kg)   SpO2 98%   BMI 23.40 kg/m     Wt Readings from Last 3 Encounters:  01/29/24 145 lb (65.8 kg)  12/13/22  152 lb (68.9 kg)  12/10/22 152 lb (68.9 kg)     GEN:  Well nourished, well developed in no acute distress HEENT: Normal NECK: No JVD; bilateral carotic bruit LYMPHATICS: No lymphadenopathy CARDIAC: RRR, no murmurs, no rubs, no gallops RESPIRATORY:  Clear to auscultation without rales, wheezing or rhonchi  ABDOMEN: Soft, non-tender, non-distended MUSCULOSKELETAL:  No edema; No deformity  SKIN: Warm and dry LOWER EXTREMITIES: no swelling NEUROLOGIC:  Alert and oriented x 3 PSYCHIATRIC:  Normal affect   ASSESSMENT:    1. Essential hypertension   2. Preoperative clearance   3. Paroxysmal A-fib (HCC)   4. Coronary artery disease involving native coronary artery of native heart without angina pectoris   5. 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  She have very high Chad Vascor, she is anticoagulated, however, if needed anticoagulation could be withdrawn between 3 and 5 days before procedure.  And restarted as quickly as feasible from surgical point review. She does have resting bradycardia I will ask her to wear Zio patch for 1 week to see if she got any critical bradycardia.  Denies having a dizziness or passing out but again story and history is somewhat unreliable  Essential hypertension blood pressure well-controlled continue present management Dyslipidemia taking Crestor  20 which I will continue, I did review KPN which show me LDL 64 HDL 81 continue present management. Carotic artery disease with bilateral bruit, followed by vascular surgeons from [facility]   Medication Adjustments/Labs and Tests Ordered: Current medicines are reviewed at length with the patient today.  Concerns regarding medicines are outlined above.  Orders Placed This Encounter  Procedures   EKG 12-Lead   Medication changes: No orders of the defined types were placed in this  encounter.   Signed, [place] DOROTHA Bernie, MD, [facility] 01/29/2024 9:28 AM    [facility]

## 2024-01-29 NOTE — Patient Instructions (Signed)
 Medication Instructions:  Your physician recommends that you continue on your current medications as directed. Please refer to the Current Medication list given to you today.  *If you need a refill on your cardiac medications before your next appointment, please call your pharmacy*   Lab Work: None Ordered If you have labs (blood work) drawn today and your tests are completely normal, you will receive your results only by: MyChart Message (if you have MyChart) OR A paper copy in the mail If you have any lab test that is abnormal or we need to change your treatment, we will call you to review the results.   Testing/Procedures: Your physician has requested that you have an echocardiogram. Echocardiography is a painless test that uses sound waves to create images of your heart. It provides your doctor with information about the size and shape of your heart and how well your heart's chambers and valves are working. This procedure takes approximately one hour. There are no restrictions for this procedure. Please do NOT wear cologne, perfume, aftershave, or lotions (deodorant is allowed). Please arrive 15 minutes prior to your appointment time.  Please note: We ask at that you not bring children with you during ultrasound (echo/ vascular) testing. Due to room size and safety concerns, children are not allowed in the ultrasound rooms during exams. Our front office staff cannot provide observation of children in our lobby area while testing is being conducted. An adult accompanying a patient to their appointment will only be allowed in the ultrasound room at the discretion of the ultrasound technician under special circumstances. We apologize for any inconvenience.   WHY IS MY DOCTOR PRESCRIBING ZIO? The Zio system is proven and trusted by physicians to detect and diagnose irregular heart rhythms -- and has been prescribed to hundreds of thousands of patients.  The FDA has cleared the Zio system to  monitor for many different kinds of irregular heart rhythms. In a study, physicians were able to reach a diagnosis 90% of the time with the Zio system1.  You can wear the Zio monitor -- a small, discreet, comfortable patch -- during your normal day-to-day activity, including while you sleep, shower, and exercise, while it records every single heartbeat for analysis.  1Barrett, P., et al. Comparison of 24 Hour Holter Monitoring Versus 14 Day Novel Adhesive Patch Electrocardiographic Monitoring. American Journal of Medicine, 2014.  ZIO VS. HOLTER MONITORING The Zio monitor can be comfortably worn for up to 14 days. Holter monitors can be worn for 24 to 48 hours, limiting the time to record any irregular heart rhythms you may have. Zio is able to capture data for the 51% of patients who have their first symptom-triggered arrhythmia after 48 hours.1  LIVE WITHOUT RESTRICTIONS The Zio ambulatory cardiac monitor is a small, unobtrusive, and water-resistant patch--you might even forget you're wearing it. The Zio monitor records and stores every beat of your heart, whether you're sleeping, working out, or showering.     Follow-Up: At [facility], you and your health needs are our priority.  As part of our continuing mission to provide you with exceptional heart care, we have created designated Provider Care Teams.  These Care Teams include your primary Cardiologist (physician) and Advanced Practice Providers (APPs -  Physician Assistants and Nurse Practitioners) who all work together to provide you with the care you need, when you need it.  We recommend signing up for the patient portal called "MyChart".  Sign up information is provided on this After  Visit Summary.  MyChart is used to connect with patients for Virtual Visits (Telemedicine).  Patients are able to view lab/test results, encounter notes, upcoming appointments, etc.  Non-urgent messages can be sent to your provider as well.   To learn more  about what you can do with MyChart, go to [email or web address].    Your next appointment:   6 month(s)  The format for your next appointment:   In Person  Provider:   Gypsy Balsam, MD    Other Instructions NA

## 2024-01-30 NOTE — Telephone Encounter (Signed)
 Clearance has been completed and routed

## 2024-01-30 NOTE — Telephone Encounter (Signed)
   Primary Cardiologist: Lamar Fitch, MD  Chart reviewed as part of pre-operative protocol coverage. Given past medical history and time since last visit, based on ACC/AHA guidelines, Tiffany Wiggins would be at acceptable risk for the planned procedure without further cardiovascular testing.   Patient should contact our office if she is having new symptoms that are concerning from a cardiac perspective to arrange a follow-up appointment.    Per office protocol, she may hold Eliquis  for 3 days prior to injection and should resume as soon as possible post procedure.  I will route this recommendation to the requesting party via Epic fax function and remove from pre-op pool.  Please call with questions.  Rosaline EMERSON Bane, NP-C 01/30/2024, 9:57 AM [address], [address] [place], [place] [address] Office [phone] Fax [phone]

## 2024-01-30 NOTE — Telephone Encounter (Signed)
 I will reach back out to Dr. Bernie scheduling team to see if they have been able to get an appt for the pt. See previous notes.

## 2024-01-30 NOTE — Telephone Encounter (Signed)
 CORRECTION: pt saw Dr. Krasowski 01/29/24 for preop clearance. I will forward to preop APP to review MD's notes if any further notes are needed.

## 2024-02-17 DIAGNOSIS — I480 Paroxysmal atrial fibrillation: Secondary | ICD-10-CM | POA: Diagnosis not present

## 2024-02-18 ENCOUNTER — Other Ambulatory Visit: Payer: Self-pay | Admitting: Cardiology

## 2024-02-20 ENCOUNTER — Ambulatory Visit: Payer: Self-pay | Admitting: Cardiology

## 2024-02-20 ENCOUNTER — Ambulatory Visit: Attending: Cardiology

## 2024-02-20 DIAGNOSIS — R0609 Other forms of dyspnea: Secondary | ICD-10-CM | POA: Diagnosis not present

## 2024-02-21 LAB — ECHOCARDIOGRAM COMPLETE
AR max vel: 1.77 cm2
AV Area VTI: 1.97 cm2
AV Area mean vel: 2 cm2
AV Mean grad: 4.4 mmHg
AV Peak grad: 11.3 mmHg
Ao pk vel: 1.68 m/s
Area-P 1/2: 3.31 cm2
S' Lateral: 3.6 cm

## 2024-02-26 ENCOUNTER — Telehealth: Payer: Self-pay

## 2024-02-26 NOTE — Telephone Encounter (Signed)
 Echo Results reviewed with pt as per Dr. Vanetta Shawl note.  Pt verbalized understanding and had no additional questions. Routed to PCP
